# Patient Record
Sex: Male | Born: 1950 | Race: Black or African American | Hispanic: No | Marital: Married | Smoking: Former smoker
Health system: Southern US, Community
[De-identification: ages and names within clinical notes are randomized; demographics above are authoritative.]

## PROBLEM LIST (undated history)

## (undated) DIAGNOSIS — M5412 Radiculopathy, cervical region: Secondary | ICD-10-CM

## (undated) DIAGNOSIS — I2510 Atherosclerotic heart disease of native coronary artery without angina pectoris: Secondary | ICD-10-CM

## (undated) DIAGNOSIS — E785 Hyperlipidemia, unspecified: Secondary | ICD-10-CM

## (undated) DIAGNOSIS — G894 Chronic pain syndrome: Secondary | ICD-10-CM

## (undated) DIAGNOSIS — K219 Gastro-esophageal reflux disease without esophagitis: Secondary | ICD-10-CM

## (undated) DIAGNOSIS — I10 Essential (primary) hypertension: Secondary | ICD-10-CM

## (undated) HISTORY — DX: Chronic pain syndrome: G89.4

## (undated) HISTORY — DX: Essential (primary) hypertension: I10

## (undated) HISTORY — PX: CHOLECYSTECTOMY: SHX55

## (undated) HISTORY — DX: Gastro-esophageal reflux disease without esophagitis: K21.9

## (undated) HISTORY — DX: Hyperlipidemia, unspecified: E78.5

## (undated) HISTORY — DX: Atherosclerotic heart disease of native coronary artery without angina pectoris: I25.10

## (undated) HISTORY — DX: Radiculopathy, cervical region: M54.12

## (undated) HISTORY — PX: HERNIA REPAIR: SHX51

## (undated) HISTORY — PX: OTHER SURGICAL HISTORY: SHX169

## (undated) HISTORY — PX: CORONARY ARTERY BYPASS GRAFT: SHX141

---

## 2002-09-18 ENCOUNTER — Inpatient Hospital Stay (HOSPITAL_COMMUNITY): Admission: EM | Admit: 2002-09-18 | Discharge: 2002-09-20 | Payer: Self-pay | Admitting: Internal Medicine

## 2004-07-21 ENCOUNTER — Ambulatory Visit: Payer: Self-pay | Admitting: Physician Assistant

## 2006-10-28 ENCOUNTER — Inpatient Hospital Stay (HOSPITAL_COMMUNITY): Admission: RE | Admit: 2006-10-28 | Discharge: 2006-11-09 | Payer: Self-pay | Admitting: Cardiovascular Disease

## 2006-10-28 ENCOUNTER — Encounter: Payer: Self-pay | Admitting: Vascular Surgery

## 2006-10-28 ENCOUNTER — Ambulatory Visit: Payer: Self-pay | Admitting: Cardiothoracic Surgery

## 2006-10-29 ENCOUNTER — Encounter: Payer: Self-pay | Admitting: Cardiology

## 2006-10-29 ENCOUNTER — Ambulatory Visit: Payer: Self-pay | Admitting: Cardiology

## 2006-11-02 ENCOUNTER — Encounter (INDEPENDENT_AMBULATORY_CARE_PROVIDER_SITE_OTHER): Payer: Self-pay | Admitting: *Deleted

## 2006-11-17 ENCOUNTER — Ambulatory Visit: Payer: Self-pay | Admitting: Cardiovascular Disease

## 2006-11-17 LAB — CONVERTED CEMR LAB
BUN: 10 mg/dL (ref 6–23)
Basophils Relative: 0.4 % (ref 0.0–1.0)
CO2: 30 meq/L (ref 19–32)
Calcium: 9.1 mg/dL (ref 8.4–10.5)
Chloride: 107 meq/L (ref 96–112)
Eosinophils Absolute: 0.1 10*3/uL (ref 0.0–0.6)
GFR calc Af Amer: 129 mL/min
GFR calc non Af Amer: 107 mL/min
Lymphocytes Relative: 19.4 % (ref 12.0–46.0)
Monocytes Relative: 6.1 % (ref 3.0–11.0)
Neutro Abs: 5.4 10*3/uL (ref 1.4–7.7)
Platelets: 542 10*3/uL — ABNORMAL HIGH (ref 150–400)
RBC: 3.68 M/uL — ABNORMAL LOW (ref 4.22–5.81)

## 2006-11-19 ENCOUNTER — Ambulatory Visit: Payer: Self-pay | Admitting: Cardiothoracic Surgery

## 2006-12-03 ENCOUNTER — Ambulatory Visit: Payer: Self-pay | Admitting: Thoracic Surgery (Cardiothoracic Vascular Surgery)

## 2006-12-21 ENCOUNTER — Encounter: Admission: RE | Admit: 2006-12-21 | Discharge: 2006-12-21 | Payer: Self-pay | Admitting: Cardiothoracic Surgery

## 2006-12-21 ENCOUNTER — Ambulatory Visit: Payer: Self-pay | Admitting: Cardiothoracic Surgery

## 2007-01-07 ENCOUNTER — Ambulatory Visit: Payer: Self-pay | Admitting: Cardiothoracic Surgery

## 2007-01-07 ENCOUNTER — Encounter: Admission: RE | Admit: 2007-01-07 | Discharge: 2007-01-07 | Payer: Self-pay | Admitting: Cardiothoracic Surgery

## 2007-01-14 ENCOUNTER — Ambulatory Visit: Payer: Self-pay | Admitting: Cardiothoracic Surgery

## 2007-01-21 ENCOUNTER — Ambulatory Visit: Payer: Self-pay | Admitting: Cardiovascular Disease

## 2007-01-21 LAB — CONVERTED CEMR LAB
AST: 50 units/L — ABNORMAL HIGH (ref 0–37)
Bilirubin, Direct: 0.1 mg/dL (ref 0.0–0.3)
HDL: 45.4 mg/dL (ref >39.0)
Total Bilirubin: 0.5 mg/dL (ref 0.3–1.2)
Total Protein: 7.6 g/dL (ref 6.0–8.3)
Triglycerides: 95 mg/dL (ref 0–149)

## 2007-02-08 ENCOUNTER — Ambulatory Visit: Payer: Self-pay | Admitting: Cardiovascular Disease

## 2007-02-08 ENCOUNTER — Ambulatory Visit: Payer: Self-pay

## 2007-02-08 ENCOUNTER — Encounter: Payer: Self-pay | Admitting: Cardiovascular Disease

## 2007-02-08 LAB — CONVERTED CEMR LAB
BUN: 6 mg/dL (ref 6–23)
GFR calc Af Amer: 151 mL/min
GFR calc non Af Amer: 124 mL/min
Potassium: 3.6 meq/L (ref 3.5–5.1)

## 2007-02-11 ENCOUNTER — Ambulatory Visit: Payer: Self-pay | Admitting: Cardiothoracic Surgery

## 2007-02-11 ENCOUNTER — Encounter: Admission: RE | Admit: 2007-02-11 | Discharge: 2007-02-11 | Payer: Self-pay | Admitting: Thoracic Surgery

## 2007-02-17 ENCOUNTER — Ambulatory Visit (HOSPITAL_COMMUNITY): Admission: RE | Admit: 2007-02-17 | Discharge: 2007-02-17 | Payer: Self-pay | Admitting: Cardiothoracic Surgery

## 2007-02-22 ENCOUNTER — Ambulatory Visit: Payer: Self-pay | Admitting: Cardiothoracic Surgery

## 2007-02-25 ENCOUNTER — Ambulatory Visit: Payer: Self-pay | Admitting: Cardiothoracic Surgery

## 2007-03-08 ENCOUNTER — Ambulatory Visit: Payer: Self-pay | Admitting: Cardiology

## 2007-03-08 ENCOUNTER — Ambulatory Visit: Payer: Self-pay | Admitting: Internal Medicine

## 2007-03-08 LAB — CONVERTED CEMR LAB
Basophils Absolute: 0 10*3/uL (ref 0.0–0.1)
Eosinophils Relative: 1.5 % (ref 0.0–5.0)
HCT: 41.3 % (ref 39.0–52.0)
MCHC: 33.4 g/dL (ref 30.0–36.0)
Neutrophils Relative %: 44.9 % (ref 43.0–77.0)
RBC: 5.18 M/uL (ref 4.22–5.81)
RDW: 16.1 % — ABNORMAL HIGH (ref 11.5–14.6)
WBC: 5.9 10*3/uL (ref 4.5–10.5)

## 2007-03-11 ENCOUNTER — Ambulatory Visit: Payer: Self-pay | Admitting: Cardiovascular Disease

## 2007-03-23 ENCOUNTER — Ambulatory Visit: Payer: Self-pay

## 2007-04-08 ENCOUNTER — Ambulatory Visit: Payer: Self-pay | Admitting: Cardiothoracic Surgery

## 2007-05-02 ENCOUNTER — Encounter
Admission: RE | Admit: 2007-05-02 | Discharge: 2007-07-31 | Payer: Self-pay | Admitting: Physical Medicine & Rehabilitation

## 2007-05-03 ENCOUNTER — Ambulatory Visit: Payer: Self-pay | Admitting: Physical Medicine & Rehabilitation

## 2007-07-08 ENCOUNTER — Ambulatory Visit: Payer: Self-pay | Admitting: Physical Medicine & Rehabilitation

## 2007-07-13 ENCOUNTER — Ambulatory Visit: Payer: Self-pay | Admitting: Cardiovascular Disease

## 2007-08-08 ENCOUNTER — Encounter
Admission: RE | Admit: 2007-08-08 | Discharge: 2007-11-06 | Payer: Self-pay | Admitting: Physical Medicine & Rehabilitation

## 2007-08-08 ENCOUNTER — Ambulatory Visit: Payer: Self-pay | Admitting: Physical Medicine & Rehabilitation

## 2007-09-05 ENCOUNTER — Ambulatory Visit: Payer: Self-pay | Admitting: Physical Medicine & Rehabilitation

## 2007-10-20 ENCOUNTER — Ambulatory Visit: Payer: Self-pay | Admitting: Physical Medicine & Rehabilitation

## 2007-10-20 ENCOUNTER — Encounter
Admission: RE | Admit: 2007-10-20 | Discharge: 2008-01-18 | Payer: Self-pay | Admitting: Physical Medicine & Rehabilitation

## 2007-11-15 ENCOUNTER — Ambulatory Visit: Payer: Self-pay | Admitting: Cardiovascular Disease

## 2007-12-15 ENCOUNTER — Ambulatory Visit: Payer: Self-pay | Admitting: Physical Medicine & Rehabilitation

## 2008-06-07 ENCOUNTER — Encounter
Admission: RE | Admit: 2008-06-07 | Discharge: 2008-06-07 | Payer: Self-pay | Admitting: Physical Medicine & Rehabilitation

## 2008-06-18 ENCOUNTER — Ambulatory Visit: Payer: Self-pay | Admitting: Cardiovascular Disease

## 2008-06-26 ENCOUNTER — Ambulatory Visit: Payer: Self-pay | Admitting: Cardiovascular Disease

## 2008-06-26 ENCOUNTER — Inpatient Hospital Stay (HOSPITAL_COMMUNITY): Admission: AD | Admit: 2008-06-26 | Discharge: 2008-06-28 | Payer: Self-pay | Admitting: Cardiovascular Disease

## 2008-06-26 ENCOUNTER — Ambulatory Visit: Payer: Self-pay

## 2008-07-09 ENCOUNTER — Ambulatory Visit: Payer: Self-pay | Admitting: Cardiovascular Disease

## 2008-07-09 LAB — CONVERTED CEMR LAB
BUN: 15 mg/dL (ref 6–23)
Chloride: 98 meq/L (ref 96–112)
GFR calc non Af Amer: 92 mL/min
Potassium: 4.1 meq/L (ref 3.5–5.1)

## 2008-08-03 DIAGNOSIS — I10 Essential (primary) hypertension: Secondary | ICD-10-CM | POA: Insufficient documentation

## 2008-08-03 DIAGNOSIS — E782 Mixed hyperlipidemia: Secondary | ICD-10-CM

## 2008-08-03 DIAGNOSIS — I25810 Atherosclerosis of coronary artery bypass graft(s) without angina pectoris: Secondary | ICD-10-CM

## 2008-09-04 ENCOUNTER — Ambulatory Visit: Payer: Self-pay | Admitting: Cardiovascular Disease

## 2008-09-18 ENCOUNTER — Ambulatory Visit: Payer: Self-pay | Admitting: Cardiovascular Disease

## 2008-09-18 ENCOUNTER — Ambulatory Visit: Payer: Self-pay

## 2008-09-18 LAB — CONVERTED CEMR LAB
ALT: 46 units/L (ref 0–53)
HDL: 28.9 mg/dL — ABNORMAL LOW (ref >39.0)
Total Bilirubin: 1.2 mg/dL (ref 0.3–1.2)
Total Protein: 8 g/dL (ref 6.0–8.3)
VLDL: 36 mg/dL (ref 0–40)

## 2009-04-05 ENCOUNTER — Ambulatory Visit: Payer: Self-pay | Admitting: Cardiovascular Disease

## 2009-07-04 ENCOUNTER — Telehealth (INDEPENDENT_AMBULATORY_CARE_PROVIDER_SITE_OTHER): Payer: Self-pay | Admitting: *Deleted

## 2009-07-04 ENCOUNTER — Ambulatory Visit: Payer: Self-pay | Admitting: Cardiovascular Disease

## 2009-07-24 ENCOUNTER — Encounter (INDEPENDENT_AMBULATORY_CARE_PROVIDER_SITE_OTHER): Payer: Self-pay

## 2009-07-24 LAB — CONVERTED CEMR LAB
ALT: 56 units/L — ABNORMAL HIGH (ref 0–53)
AST: 66 units/L — ABNORMAL HIGH (ref 0–37)
Albumin: 3.6 g/dL (ref 3.5–5.2)
Cholesterol: 169 mg/dL (ref 0–200)
Direct LDL: 92.7 mg/dL
Total CHOL/HDL Ratio: 4
Total Protein: 7.3 g/dL (ref 6.0–8.3)
Triglycerides: 211 mg/dL — ABNORMAL HIGH (ref 0.0–149.0)

## 2009-08-15 ENCOUNTER — Telehealth: Payer: Self-pay | Admitting: Cardiovascular Disease

## 2009-10-02 ENCOUNTER — Ambulatory Visit: Payer: Self-pay | Admitting: Cardiovascular Disease

## 2009-10-02 LAB — CONVERTED CEMR LAB: Albumin: 3.4 g/dL — ABNORMAL LOW (ref 3.5–5.2)

## 2009-10-03 ENCOUNTER — Ambulatory Visit: Payer: Self-pay | Admitting: Cardiovascular Disease

## 2009-12-11 ENCOUNTER — Ambulatory Visit: Payer: Medicare Other | Admitting: Pain Medicine

## 2009-12-27 ENCOUNTER — Telehealth: Payer: Self-pay | Admitting: Cardiovascular Disease

## 2010-01-02 ENCOUNTER — Telehealth: Payer: Self-pay | Admitting: Cardiovascular Disease

## 2010-01-21 ENCOUNTER — Ambulatory Visit: Payer: Medicare Other | Admitting: Pain Medicine

## 2010-02-06 ENCOUNTER — Ambulatory Visit: Payer: Medicare Other | Admitting: Pain Medicine

## 2010-02-10 ENCOUNTER — Telehealth: Payer: Self-pay | Admitting: Cardiovascular Disease

## 2010-02-18 ENCOUNTER — Ambulatory Visit: Payer: Medicare Other | Admitting: Pain Medicine

## 2010-02-25 ENCOUNTER — Ambulatory Visit: Payer: Medicare Other | Admitting: Pain Medicine

## 2010-03-24 ENCOUNTER — Ambulatory Visit: Payer: Medicare Other | Admitting: Pain Medicine

## 2010-04-03 ENCOUNTER — Ambulatory Visit: Payer: Medicare Other | Admitting: Pain Medicine

## 2010-04-14 ENCOUNTER — Ambulatory Visit: Payer: Self-pay | Admitting: Cardiovascular Disease

## 2010-04-14 DIAGNOSIS — R079 Chest pain, unspecified: Secondary | ICD-10-CM

## 2010-04-17 ENCOUNTER — Ambulatory Visit: Payer: Medicare Other | Admitting: Pain Medicine

## 2010-04-22 ENCOUNTER — Telehealth (INDEPENDENT_AMBULATORY_CARE_PROVIDER_SITE_OTHER): Payer: Self-pay

## 2010-04-23 ENCOUNTER — Ambulatory Visit: Payer: Self-pay | Admitting: Cardiovascular Disease

## 2010-04-23 ENCOUNTER — Encounter: Payer: Self-pay | Admitting: Cardiology

## 2010-04-23 ENCOUNTER — Ambulatory Visit: Payer: Self-pay | Admitting: Cardiology

## 2010-04-23 ENCOUNTER — Encounter (HOSPITAL_COMMUNITY): Admission: RE | Admit: 2010-04-23 | Discharge: 2010-07-04 | Payer: Self-pay | Admitting: Internal Medicine

## 2010-04-23 ENCOUNTER — Ambulatory Visit: Payer: Self-pay

## 2010-04-23 DIAGNOSIS — E7800 Pure hypercholesterolemia, unspecified: Secondary | ICD-10-CM | POA: Insufficient documentation

## 2010-04-29 ENCOUNTER — Ambulatory Visit: Payer: Medicare Other | Admitting: Pain Medicine

## 2010-05-01 ENCOUNTER — Telehealth: Payer: Self-pay | Admitting: Cardiovascular Disease

## 2010-05-05 ENCOUNTER — Encounter: Payer: Self-pay | Admitting: Cardiovascular Disease

## 2010-05-05 LAB — CONVERTED CEMR LAB
AST: 75 units/L — ABNORMAL HIGH (ref 0–37)
Albumin: 3.7 g/dL (ref 3.5–5.2)
Alkaline Phosphatase: 74 units/L (ref 39–117)
Bilirubin, Direct: 0.4 mg/dL — ABNORMAL HIGH (ref 0.0–0.3)
CO2: 28 meq/L (ref 19–32)
Chloride: 100 meq/L (ref 96–112)
Cholesterol: 189 mg/dL (ref 0–200)
Creatinine, Ser: 0.8 mg/dL (ref 0.4–1.5)
Glucose, Bld: 99 mg/dL (ref 70–99)
HDL: 57.3 mg/dL (ref >39.00)
LDL Cholesterol: 114 mg/dL — ABNORMAL HIGH (ref 0–99)
Total Bilirubin: 1.2 mg/dL (ref 0.3–1.2)
Total CHOL/HDL Ratio: 3
VLDL: 17.4 mg/dL (ref 0.0–40.0)

## 2010-05-07 ENCOUNTER — Ambulatory Visit: Payer: Self-pay | Admitting: Cardiovascular Disease

## 2010-05-07 ENCOUNTER — Ambulatory Visit (HOSPITAL_COMMUNITY): Admission: RE | Admit: 2010-05-07 | Discharge: 2010-05-07 | Payer: Self-pay | Admitting: Cardiovascular Disease

## 2010-05-07 LAB — CONVERTED CEMR LAB
BUN: 21 mg/dL (ref 6–23)
Basophils Absolute: 0 10*3/uL (ref 0.0–0.1)
Creatinine, Ser: 0.9 mg/dL (ref 0.4–1.5)
GFR calc non Af Amer: 112.54 mL/min (ref >60)
Glucose, Bld: 101 mg/dL — ABNORMAL HIGH (ref 70–99)
HCT: 45.4 % (ref 39.0–52.0)
Lymphs Abs: 2.1 10*3/uL (ref 0.7–4.0)
MCV: 88.8 fL (ref 78.0–100.0)
Monocytes Absolute: 0.7 10*3/uL (ref 0.1–1.0)
Monocytes Relative: 8.1 % (ref 3.0–12.0)
Neutrophils Relative %: 67.5 % (ref 43.0–77.0)
Platelets: 188 10*3/uL (ref 150.0–400.0)
Potassium: 4.6 meq/L (ref 3.5–5.1)
Prothrombin Time: 9.5 s — ABNORMAL LOW (ref 9.7–11.8)
RDW: 15.4 % — ABNORMAL HIGH (ref 11.5–14.6)

## 2010-05-09 ENCOUNTER — Inpatient Hospital Stay (HOSPITAL_BASED_OUTPATIENT_CLINIC_OR_DEPARTMENT_OTHER)
Admission: RE | Admit: 2010-05-09 | Discharge: 2010-05-09 | Payer: Self-pay | Source: Home / Self Care | Admitting: Cardiovascular Disease

## 2010-05-09 ENCOUNTER — Ambulatory Visit: Payer: Self-pay | Admitting: Cardiovascular Disease

## 2010-06-16 ENCOUNTER — Ambulatory Visit: Payer: Medicare Other | Admitting: Pain Medicine

## 2010-07-23 ENCOUNTER — Ambulatory Visit: Payer: Self-pay | Admitting: Cardiovascular Disease

## 2010-09-02 NOTE — Progress Notes (Signed)
Summary: epidural injection  Phone Note Call from Patient Call back at [phone]   Caller: Patient Reason for Call: Talk to Nurse Summary of Call: wants to know if it ok for him to come off plavix for 7 days for epidural injection Initial call taken by: Migdalia Dk,  Dec 27, 2009 9:13 AM  Follow-up for Phone Call        I spoke with [name] about epidural injection for his lower back.  He is scheduled for 5/31 but will postpone it until he hears back from Dr. Earmon Phoenix office.  According to his MD at [facility] in [place] he needs to be off aspirin for 11 days and plavix 7 days.  If this is not okay with Dr. Excell Seltzer then Mr. [name] Keels MD will try a different treatment.  [name] will await Dr. Hilbert Corrigan return call and will resume his aspirin.  He has not stopped his plavix.   Lisabeth Devoid RN  Additional Follow-up for Phone Call Additional follow up Details #1::        Pt greater than one year out from PCI with a drug-eluting stent. OK to come off of ASA and Plavix for procedure if needed. I would like him to minimize time off of these meds to 7 days if possible. Spoke with the patient and informed him of this. Also would be happy to speak with his pain management physician if needed - he will let the pain clinic know. Additional Follow-up by: Norva Karvonen, MD,  January 01, 2010 5:10 PM

## 2010-09-02 NOTE — Progress Notes (Signed)
Summary: Need Cath  Phone Note Outgoing Call   Summary of Call: Called and left message with [name] [name] to discuss stress test results. He will call back. Dr Excell Seltzer  Follow-up for Phone Call        pt rtn call to Dr. Excell Seltzer you can reach him at [phone] [name] [name]  May 02, 2010 9:33 AM   pt returning call re test results-[phone] [name] [name]  May 05, 2010 9:51 AM  Lab and [place] results given to the pt.  The pt will proceed with cardiac cath.   Follow-up by: Julieta Gutting, RN, BSN,  May 05, 2010 1:05 PM

## 2010-09-02 NOTE — Assessment & Plan Note (Signed)
Summary: 6 month rov    Visit Type:  6 months follow up  CC:  Chest pains. Pt. states could be indigestion or gas.  History of Present Illness: [name] [name] is a 60 year old gentleman who underwent multivessel coronary bypass surgery in 2008 after presenting with non-ST-elevation MI.   Myoview stress scan was performed in 2009 and that demonstrated inferolateral wall ischemia with positive EKG changes.  He ultimately underwent catheterization that demonstrated closure of all of his vein grafts and severe stenosis at the anastomosis of the LIMA to LAD, that was treated with a drug-eluting stent.   He has been stable since that time with only occasional chest discomfort - he attributes this to indigestion as it occurs after eating. No exertional chest pain or dyspnea. No edema or other complaints.   Current Medications (verified): 1)  Plavix 75 Mg Tabs (Clopidogrel Bisulfate) .... Take One Tablet By Mouth Daily 2)  Hydrochlorothiazide 25 Mg Tabs (Hydrochlorothiazide) .... Take One Tablet By Mouth Daily. 3)  Toprol Xl 50 Mg Xr24h-Tab (Metoprolol Succinate) .... Take 1 Tablet By Mouth Once A Day 4)  Aspirin 81 Mg Tbec (Aspirin) .... Take One Tablet By Mouth Daily 5)  Nitroglycerin 0.4 Mg Subl (Nitroglycerin) .... One Tablet Under Tongue Every 5 Minutes As Needed For Chest Pain---May Repeat Times Three 6)  Celexa 20 Mg Tabs (Citalopram Hydrobromide) .... As Needed 7)  Pravastatin Sodium 40 Mg Tabs (Pravastatin Sodium) .... Take One Tablet By Mouth Daily At Bedtime 8)  Meloxicam 15 Mg Tabs (Meloxicam) .... Take 1 Tablet By Mouth Once A Day 9)  Hydrocodone-Acetaminophen 10-325 Mg Tabs (Hydrocodone-Acetaminophen) .... As Needed  Allergies (verified): No Known Drug Allergies  Past History:  Past medical history reviewed for relevance to current acute and chronic problems.  Past Medical History: Reviewed history from 08/03/2008 and no changes required. CAD s/p CABG PCI 2009 with stenting of LIMA  - LAD anastamosis Hyperlipidemia Hypertension GERD Chronic pain syndrome  Past Surgical History: CABG - multivessel Hernia repair Cholecystectomy  Review of Systems       Negative except as per HPI   Vital Signs:  Patient profile:   60 year old male Height:      66 inches Weight:      210 pounds BMI:     34.02 Pulse rate:   63 / minute Pulse rhythm:   regular Resp:     18 per minute BP sitting:   131 / 80  (left arm) Cuff size:   large  Vitals Entered By: Vikki Ports (October 03, 2009 4:18 PM) CC: Chest pains. Pt. states could be indigestion or gas Comments Pt. ran out on Plavix about 3 weeks ago   Physical Exam  General:  Pt is alert and oriented, in no acute distress. HEENT: normal Neck: normal carotid upstrokes without bruits, JVP normal Lungs: CTA CV: RRR without murmur or gallop Abd: soft, NT, positive BS, no bruit, no organomegaly Ext: no clubbing, cyanosis, or edema. peripheral pulses 2+ and equal Skin: warm and dry without rash    EKG  Procedure date:  10/03/2009  Findings:      NSR with RBBB, unchanged from previous tracing.  Impression & Recommendations:  Problem # 1:  CAD, ARTERY BYPASS GRAFT (ICD-414.04) Pt stable without angina at present. He is on dual antiplatelet Rx with ASA and plavix and should continue long-term because of high-risk coronary anatomy. Will f/u in 6 months. Encouraged him to reinitiate efforts at an exercise program with goal  toward improved fitness and weight loss.  His updated medication list for this problem includes:    Plavix 75 Mg Tabs (Clopidogrel bisulfate) .Marland Kitchen... Take one tablet by mouth daily    Toprol Xl 50 Mg Xr24h-tab (Metoprolol succinate) .Marland Kitchen... Take 1 tablet by mouth once a day    Aspirin 81 Mg Tbec (Aspirin) .Marland Kitchen... Take one tablet by mouth daily    Nitroglycerin 0.4 Mg Subl (Nitroglycerin) ..... One tablet under tongue every 5 minutes as needed for chest pain---may repeat times three  Orders: EKG w/  Interpretation (93000)  Problem # 2:  HYPERTENSION, BENIGN (ICD-401.1) Stable on current Rx.  His updated medication list for this problem includes:    Hydrochlorothiazide 25 Mg Tabs (Hydrochlorothiazide) .Marland Kitchen... Take one tablet by mouth daily.    Toprol Xl 50 Mg Xr24h-tab (Metoprolol succinate) .Marland Kitchen... Take 1 tablet by mouth once a day    Aspirin 81 Mg Tbec (Aspirin) .Marland Kitchen... Take one tablet by mouth daily  Orders: EKG w/ Interpretation (93000)  BP today: 131/80 Prior BP: 127/80 (04/05/2009)  Labs Reviewed: K+: 4.1 (07/09/2008) Creat: : 0.9 (07/09/2008)   Chol: 169 (07/04/2009)   HDL: 39.10 (07/04/2009)   LDL: 59 (09/18/2008)   TG: 211.0 (07/04/2009)  Problem # 3:  HYPERLIPIDEMIA-MIXED (ICD-272.4) Lipids at goal with LDL less than 70 mg/dL. LFT's have been mildly elevated but in a stable pattern and less than 2x ULN. Continue q 6 month monitoring. His updated medication list for this problem includes:    Pravastatin Sodium 40 Mg Tabs (Pravastatin sodium) .Marland Kitchen... Take one tablet by mouth daily at bedtime  Orders: EKG w/ Interpretation (93000)  CHOL: 169 (07/04/2009)   LDL: 59 (09/18/2008)   HDL: 39.10 (07/04/2009)   TG: 211.0 (07/04/2009)  Patient Instructions: 1)  Your physician recommends that you return for a FASTING LIPID and LIVER Profile in 6 MONTHS. (414.04, 401.1, 272.0)  2)  Your physician wants you to follow-up in:   6 MONTHS. You will receive a reminder letter in the mail two months in advance. If you don't receive a letter, please call our office to schedule the follow-up appointment. 3)  Your physician recommends that you continue on your current medications as directed. Please refer to the Current Medication list given to you today. Prescriptions: PLAVIX 75 MG TABS (CLOPIDOGREL BISULFATE) Take one tablet by mouth daily  #30 x 8   Entered by:   [name] Gutting, RN, BSN   Authorized by:   Norva Karvonen, MD   Signed by:   [name] Gutting, RN, BSN on 10/03/2009   Method used:    Electronically to        [facility] [address] (retail)       [address]       [place], [place]  [address]       Ph: [phone] or [phone]       Fax: [phone]   RxID:   [id number]
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
## 2010-09-02 NOTE — Letter (Signed)
Summary: Cardiac Catheterization Instructions- [facility]  [employer], Main Office  [address]   [place], [place] [address]   Phone: [phone]  Fax: [phone]     05/05/2010 MRN: [id number]  [facility] [address] [place], [place]  [address]  Dear Mr. Jergens,   You are scheduled for a Cardiac Catheterization on Friday May 09, 2010 with Dr. Excell Seltzer.  Please arrive to the 1st floor of the Heart and Vascular Center at [facility] at 10:30 am on the day of your procedure. Please do not arrive before 6:30 a.m. Call the Heart and Vascular Center at [phone] if you are unable to make your appointmnet. The Code to get into the parking garage under the building is 0200. Take the elevators to the 1st floor. You must have someone to drive you home. Someone must be with you for the first 24 hours after you arrive home. Please wear clothes that are easy to get on and off and wear slip-on shoes. Do not eat or drink after midnight except water with your medications that morning. Bring all your medications and current insurance cards with you.  _X__ Make sure you take your aspirin and plavix.  _X__ You may take ALL of your medications with water that morning.  The usual length of stay after your procedure is 2 to 3 hours. This can vary.  If you have any questions, please call the office at the number listed above.   Julieta Gutting, RN, BSN

## 2010-09-02 NOTE — Assessment & Plan Note (Signed)
Summary: Cardiology Nuclear Testing  Nuclear Med Background Indications for Stress Test: Evaluation for Ischemia, Graft Patency, Stent Patency  Indications Comments: Pending back procedure under general on 04/29/10 by Dr. Melvenia Beam  History: CABG, COPD, Echo, Heart Catheterization, Myocardial Infarction, Myocardial Perfusion Study, Stents  History Comments: '10 [id number]  Symptoms: Chest Pain, Chest Pressure, Chest Pressure with Exertion, Palpitations  Symptoms Comments: Last episode of CP:now, 3/10.   Nuclear Pre-Procedure Cardiac Risk Factors: Family History - CAD, History of Smoking, Hypertension, Lipids, Obesity, RBBB Caffeine/Decaff Intake: None NPO After: 11:30 PM Lungs: Clear IV 0.9% NS with Angio Cath: 20g     IV Site: (R) AC IV Started by: Stanton Kidney, EMT-P Chest Size (in) 44     Height (in): 68 Weight (lb): 206 BMI: 31.44 Tech Comments: Toprol held > 36 hours.  Nuclear Med Study 1 or 2 day study:  1 day     Stress Test Type:  Stress Reading MD:  Marca Ancona, MD     Referring MD:  [name], MD Resting Radionuclide:  Technetium 14m Tetrofosmin     Resting Radionuclide Dose:  11 mCi  Stress Radionuclide:  Technetium 35m Tetrofosmin     Stress Radionuclide Dose:  33 mCi   Stress Protocol Exercise Time (min):  10:31 min     Max HR:  146 bpm     Predicted Max HR:  162 bpm  Max Systolic BP: 146 mm Hg     Percent Max HR:  90.12 %     METS: 12.5 Rate Pressure Product:  91478    Stress Test Technologist:  Rea [employer], CMA-N     Nuclear Technologist:  Doyne Keel, CNMT  Rest Procedure  Myocardial perfusion imaging was performed at rest 45 minutes following the intravenous administration of Technetium 35m Tetrofosmin.  Stress Procedure  The patient exercised for 10:31.  The patient stopped due to fatigue and denied any chest pain.  There were significant ST-T wave changes and occasional PVC's.  He had a mild hypertensive response to exercise >189/108 at peak  exercise.  Technetium 1m Tetrofosmin was injected at peak exercise and myocardial perfusion imaging was performed after a brief delay.  QPS Raw Data Images:  Normal; no motion artifact; normal heart/lung ratio. Stress Images:  Mild basal to mid inferior perfusion defect.  Rest Images:  Mild basal to mid inferior perfusion defect, less pronounced than stress.  Subtraction (SDS):  Mild, partially reversible basal to mid inferior perfusion defect.  Transient Ischemic Dilatation:  1.08  (Normal <1.22)  Lung/Heart Ratio:  .31  (Normal <0.45)  Quantitative Gated Spect Images QGS EDV:  102 ml QGS ESV:  38 ml QGS EF:  63 % QGS cine images:  Normal wall motion.    Overall Impression  Exercise Capacity: Good exercise capacity. BP Response: Hypertensive blood pressure response. Clinical Symptoms: Short of breath, fatigue.  ECG Impression: 2 mm horizontal ST depression in V4 and V5, lasting over 6 minutes into recovery.  Overall Impression: Mild partially reversible basal to mid inferior perfusion defect, given normal wall motion may be diaphragmatic attenuation, but given ECG changes cannot rule out ischemia.  Overall Impression Comments: This study does not appear to be significantly different from the prior myoview in 2010.   Appended Document: Cardiology Nuclear Testing Inferior ischemia with significant ST depression lasting 6 minutes into recovery. Pt with high-risk anatomy (LIMA-LAD anastomotic stent) - favor cardiac catheterization for definitive evaluation. Will call patient to discuss.  Appended Document: Cardiology Nuclear Testing Pt aware  of results by phone.  Pt scheduled for cardiac cath.

## 2010-09-02 NOTE — Progress Notes (Signed)
Summary: Nuc. Pre-Procedure  Phone Note Outgoing Call Call back at [facility] Phone [phone]   Call placed by: Irean Hong, RN,  April 22, 2010 3:02 PM Summary of Call: Left message with information on Myoview Information Sheet (see scanned document for details).      Nuclear Med Background Indications for Stress Test: Evaluation for Ischemia   History: CABG, Echo, Heart Catheterization, Myocardial Infarction, Myocardial Perfusion Study, Stents  History Comments: '08 NSTEMI>CABG x5 '08 Echo:NL '09 Cath: EF=55%, 1/5 grafts patent>anastomosis LIMA>LAD. 2/10 MPS: (-) ischemia, mild inferobasal thinning, EF=61%.  Symptoms: Chest Pain    Nuclear Pre-Procedure Cardiac Risk Factors: Family History - CAD, Hypertension, Lipids, RBBB Height (in): 66

## 2010-09-02 NOTE — Progress Notes (Signed)
Summary:  lab  Phone Note Call from Patient Call back at Home Phone [phone] Call back at [phone]   Caller: Patient Reason for Call: Lab or Test Results Initial call taken by: Judie Grieve,  August 15, 2009 1:49 PM  Follow-up for Phone Call        Pt did not answer phone. The mailbox was full and it would not let me leave a message. Duncan Dull, RN, BSN  August 15, 2009 2:12 PM  talked with patient--verifed he would come for lab 09-04-09

## 2010-09-02 NOTE — Progress Notes (Signed)
Summary: need a notes to come off asa / plavix  Phone Note Call from Patient Call back at [facility] Phone [phone]   Caller: Patient Reason for Call: Talk to Nurse Summary of Call: pls fax note regarding coming off asa 11 days / plavix 7  days . office # [phone], fax [phone]. attention . Hoy Morn ,md Initial call taken by: Lorne Skeens,  January 02, 2010 12:35 PM  Follow-up for Phone Call        12/27/09 phone note faxed Follow-up by: Julieta Gutting, RN, BSN,  January 02, 2010 12:40 PM

## 2010-09-29 ENCOUNTER — Ambulatory Visit: Payer: Medicare Other | Admitting: Pain Medicine

## 2010-10-14 ENCOUNTER — Encounter: Payer: Self-pay | Admitting: Cardiovascular Disease

## 2010-10-14 ENCOUNTER — Ambulatory Visit (INDEPENDENT_AMBULATORY_CARE_PROVIDER_SITE_OTHER): Payer: Medicare Other | Admitting: Cardiovascular Disease

## 2010-10-14 DIAGNOSIS — E7800 Pure hypercholesterolemia, unspecified: Secondary | ICD-10-CM

## 2010-10-14 DIAGNOSIS — I2510 Atherosclerotic heart disease of native coronary artery without angina pectoris: Secondary | ICD-10-CM

## 2010-10-21 ENCOUNTER — Ambulatory Visit: Payer: Medicare Other | Admitting: Pain Medicine

## 2010-10-30 NOTE — Assessment & Plan Note (Signed)
Summary: FOLLOW UP - 6 MONTHS   Visit Type:  6 months follow up Primary Provider:  Dr Servando Snare [facility])  CC:  Chest discomfort.  History of Present Illness: [name] [name] is a 60 year old gentleman who underwent multivessel coronary bypass surgery in 2008 after presenting with non-ST-elevation MI.   Myoview stress scan was performed in 2009 and that demonstrated inferolateral wall ischemia with positive EKG changes.  He ultimately underwent catheterization that demonstrated closure of all of his vein grafts and severe stenosis at the anastomosis of the LIMA to LAD and he was treated with a drug-eluting stent.Followup cath to evaluate recurrent chest pain was performed in October 2011 and this demonstratred continued patency of his LIMA-LAD and patency of the stent at the LIMA anastomotic site.  He has done well since his last cath, but has had a few episodes of chest pain over the past few days. He describes a 'throbbing' pain over a focal spot in his left chest, unrelated to exertion. He denies shortness of breath or other associated symptoms. He did not use NTG. He otherwise feels well. He thinks his symptoms may be related to increased stress and anxiety over the past few days.  Current Medications (verified): 1)  Plavix 75 Mg Tabs (Clopidogrel Bisulfate) .... Take One Tablet By Mouth Daily 2)  Hydrochlorothiazide 25 Mg Tabs (Hydrochlorothiazide) .... Take One Tablet By Mouth Daily. 3)  Toprol Xl 50 Mg Xr24h-Tab (Metoprolol Succinate) .... Take 1 Tablet By Mouth Once A Day 4)  Aspirin 81 Mg Tbec (Aspirin) .... Take One Tablet By Mouth Daily 5)  Nitroglycerin 0.4 Mg Subl (Nitroglycerin) .... One Tablet Under Tongue Every 5 Minutes As Needed For Chest Pain---May Repeat Times Three 6)  Celexa 20 Mg Tabs (Citalopram Hydrobromide) .... As Needed 7)  Pravastatin Sodium 40 Mg Tabs (Pravastatin Sodium) .... Take One Tablet By Mouth Daily At Bedtime 8)  Meloxicam 15 Mg Tabs (Meloxicam) .... Take  1 Tablet By Mouth Once A Day As Needed 9)  Oxycodone-Acetaminophen 7.5-325 Mg Tabs (Oxycodone-Acetaminophen) .... As Needed 10)  Protonix 40 Mg Tbec (Pantoprazole Sodium) .... Take 1 Tablet By Mouth Once A Day  Allergies (verified): No Known Drug Allergies  Past History:  Past medical history reviewed for relevance to current acute and chronic problems.  Past Medical History: Reviewed history from 08/03/2008 and no changes required. CAD s/p CABG PCI 2009 with stenting of LIMA - LAD anastamosis Hyperlipidemia Hypertension GERD Chronic pain syndrome  Review of Systems       Positive for low back pain and depression, otherwise negative except as per HPI.  Vital Signs:  Patient profile:   60 year old male Height:      68 inches Weight:      207.75 pounds BMI:     31.70 Pulse rate:   66 / minute Pulse rhythm:   regular Resp:     18 per minute BP sitting:   110 / 80  (left arm) Cuff size:   large  Vitals Entered By: Vikki Ports (October 14, 2010 10:40 AM)  Physical Exam  General:  Pt is alert and oriented, in no acute distress. HEENT: normal Neck: normal carotid upstrokes without bruits, JVP normal Lungs: CTA CV: RRR without murmur or gallop Abd: soft, NT, positive BS, no bruit, no organomegaly Ext: no clubbing, cyanosis, or edema. peripheral pulses 2+ and equal Skin: warm and dry without rash    EKG  Procedure date:  10/14/2010  Findings:  NSR with RBBB, 66 bpm, no change from old tracings.  Impression & Recommendations:  Problem # 1:  CAD, ARTERY BYPASS GRAFT (ICD-414.04) The patient is stable. I think his recent episodes of chest pain are related to stress. He has no exertional symptoms and underwent recent cath wtih stable coronary anatomy. Will continue current medical program.  He may need to interrupt plavix in the near future for an epidural injection to control back pain. This is acceptable and should be resumed as soon as possible following the  procedure.  His updated medication list for this problem includes:    Plavix 75 Mg Tabs (Clopidogrel bisulfate) .Marland Kitchen... Take one tablet by mouth daily    Toprol Xl 50 Mg Xr24h-tab (Metoprolol succinate) .Marland Kitchen... Take 1 tablet by mouth once a day    Aspirin 81 Mg Tbec (Aspirin) .Marland Kitchen... Take one tablet by mouth daily    Nitroglycerin 0.4 Mg Subl (Nitroglycerin) ..... 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 Will continue pravastatin, work on lifestyle modification, and followup lipids in 6 months.  His updated medication list for this problem includes:    Pravastatin Sodium 40 Mg Tabs (Pravastatin sodium) .Marland Kitchen... Take one tablet by mouth daily at bedtime  CHOL: 189 (04/23/2010)   LDL: 114 (04/23/2010)   HDL: 57.30 (04/23/2010)   TG: 87.0 (04/23/2010)  Problem # 3:  HYPERTENSION, BENIGN (ICD-401.1) well-controlled.  His updated medication list for this problem includes:    Hydrochlorothiazide 25 Mg Tabs (Hydrochlorothiazide) .Marland Kitchen... Take one tablet by mouth daily.    Toprol Xl 50 Mg Xr24h-tab (Metoprolol succinate) .Marland Kitchen... Take 1 tablet by mouth once a day    Aspirin 81 Mg Tbec (Aspirin) .Marland Kitchen... Take one tablet by mouth daily  BP today: 110/80 Prior BP: 118/80 (04/14/2010)  Labs Reviewed: K+: 4.6 (05/07/2010) Creat: : 0.9 (05/07/2010)   Chol: 189 (04/23/2010)   HDL: 57.30 (04/23/2010)   LDL: 114 (04/23/2010)   TG: 87.0 (04/23/2010)  Orders: EKG w/ Interpretation (93000)  Patient Instructions: 1)  Your physician recommends that you return for a FASTING LIPID, LIVER and BMP in 6 MONTHS.  2)  Your physician recommends that you continue on your current medications as directed. Please refer to the Current Medication list given to you today. 3)  Your physician  wants you to follow-up in:  6 MONTHS.  You will receive a reminder letter in the mail two months in advance. If you don't receive a letter, please call our office to schedule the follow-up appointment. 4)  You can hold Aspirin and Plavix 7 days prior to epidural injection.  Prescriptions: PROTONIX 40 MG TBEC (PANTOPRAZOLE SODIUM) Take 1 tablet by mouth once a day  #30 x 11   Entered by:   Julieta Gutting, RN, BSN   Authorized by:   Norva Karvonen, MD   Signed by:   Julieta Gutting, RN, BSN on 10/14/2010   Method used:   Electronically to        [facility] [address]* (retail)       [address]       [place], [place]  [address]       Ph: [phone] or [phone]       Fax: [phone]   RxID:   [id number]

## 2010-10-31 ENCOUNTER — Telehealth: Payer: Self-pay | Admitting: Cardiovascular Disease

## 2010-10-31 MED ORDER — NITROGLYCERIN 0.4 MG SL SUBL: 0.4000 mg | SUBLINGUAL_TABLET | SUBLINGUAL | Status: DC | PRN

## 2010-10-31 NOTE — Telephone Encounter (Signed)
Pt wants to see if he can get a script for his nitro called into his pharmacy. Pt went to put in pocket to carry with him and cant find them.

## 2010-11-03 ENCOUNTER — Ambulatory Visit: Payer: Medicare Other | Admitting: Pain Medicine

## 2010-11-11 ENCOUNTER — Ambulatory Visit: Payer: Medicare Other | Admitting: Pain Medicine

## 2010-11-22 ENCOUNTER — Other Ambulatory Visit: Payer: Self-pay | Admitting: Cardiovascular Disease

## 2010-12-08 ENCOUNTER — Ambulatory Visit: Payer: Medicare Other | Admitting: Pain Medicine

## 2010-12-16 NOTE — Assessment & Plan Note (Signed)
[facility]                            CARDIOLOGY OFFICE NOTE   NAME:Yohn, MATTTHEW ZIOMEK                       MRN:          [id number]  DATE:06/26/2008                            DOB:          03-03-1951    REASON FOR VISIT:  Chest pain with abnormal Myoview stress test.   HISTORY OF PRESENT ILLNESS:  Mr. Czaplicki is a 60 year old gentleman well-  known to me with multivessel coronary artery disease status post  coronary bypass surgery in 2008.  He has had a lot of difficulty with  recurrent chest pain following his bypass.  He has been treated for post-  thoracotomy pain syndrome by pain management specialist.  He presented  last week with recurrent chest discomfort that involved a change in  character of his chronic chest pain syndrome.  He also complained of  severe generalized fatigue.  He has not had a strong exertional  component to his chest pain, and it has predominately occurred at rest.  He describes a numbness and tightness around the midportion of the chest  that he has had ever since surgery.  However, he describes heart pain  that is a new sensation that he has felt over the last several weeks.  This is also a substernal chest pressure that is somewhat different in  character from his chronic ongoing pain.   He underwent a Myoview stress scan earlier today with an EKG that shows  normal sinus rhythm and right bundle branch block at baseline.  With  exercise, he developed marked downsloping ST depression in his lateral  precordial leads.  These ST changes persisted well into recovery.  He  also developed chest pressure during recovery.  His stress study has not  been read preliminarily.  His images show a small to moderate area of  inferolateral ischemia.   CURRENT MEDICATIONS:  1. Gabapentin 600 mg t.i.d.  2. Lipitor 20 mg daily.  3. Aspirin 325 mg daily.  4. Metoprolol 50 mg b.i.d.  5. Hydrochlorothiazide 25 mg daily.  6. Celexa 20 mg  daily.   ALLERGIES:  NKDA.   FOCUSED PAST MEDICAL HISTORY:  Includes:  1. Coronary artery disease status post multivessel coronary bypass      surgery 2008.  2. Hypertension.  3. Depression.  4. Hyperlipidemia.  5. Post-thoracotomy syndrome.   PHYSICAL EXAMINATION:  The patient is alert and oriented.  He is in no  acute distress.  Heart rate 93 beats per minute, blood pressure was  153/97, respiratory rate 16.  HEENT:  Normal.  NECK:  Normal carotid upstrokes.  No bruits.  JVP normal.  LUNGS:  Clear bilaterally.  HEART:  Regular rate and rhythm.  Apex is discrete and nondisplaced.  ABDOMEN:  Soft, nontender, no organomegaly.  EXTREMITIES:  No clubbing, cyanosis or edema.  Peripheral pulses intact  and equal bilaterally.  SKIN:  Warm and dry without rash.   EKG shows normal sinus rhythm with right bundle branch block.   ASSESSMENT:  Mr. Walrond is a 60 year old gentleman with mild multivessel  CAD status post CABG presenting with new-onset angina  and an abnormal  Myoview scan as outlined above.  I think in the setting of his marked  EKG changes and recurrent chest pain at rest that hospital admission is  warranted.  He will be admitted from the office, and we will plan on a  diagnostic catheterization tomorrow.  I am going to preload him with 300  mg of Plavix today and start him on 75 mg of Plavix daily to be taken in  addition to his aspirin.  If he has recurrent chest pain, will start IV  heparin.  We will continue his other cardiac medications at the present  time.  Risks and indications of the procedure were reviewed in detail  with the patient and his wife, and they are agreeable to proceed.     Veverly Fells. Excell Seltzer, MD  Electronically Signed    MDC/MedQ  DD: 06/26/2008  DT: 06/26/2008  Job #: [id number]

## 2010-12-16 NOTE — Group Therapy Note (Signed)
Consult requested for the evaluation of persistent chest pain, status  post CABG April of 2008. Patient also with right lower extremity pain,  same onset.   HISTORY:  [name] is a 60 year old male who has a history of  coronary artery disease. He states he had an myocardial infarction  approximately 4 years ago but no residual heart damage. He also has the  history of emphysema per his report and used to smoke but no longer does  so. He underwent coronary artery bypassing times 5, left internal  mammary to the left anterior descending, left radial artery free graft  to circumflex marginal, sequential saphenous graft to posterior  descending and posterior lateral, saphenous vein graft to diagonal. He  had endoscopic saphenous vein harvesting from the right leg.  Postoperatively he had some volume overload   Dictation ended at this point.      Erick Colace, M.D.  Electronically Signed     AEK/MedQ  D:  05/03/2007 12:48:34  T:  05/03/2007 13:15:38  Job #:  [id number]   cc:   Kerin Perna, M.D.  [address]  [place]  [place] [address]

## 2010-12-16 NOTE — Assessment & Plan Note (Signed)
[facility]                            CARDIOLOGY OFFICE NOTE   NAME:Lizardi, HERALD VALLIN                       MRN:          [id number]  DATE:07/09/2008                            DOB:          June 25, 1951    Bonne Dolores presents for followup at the [facility] office on  July 09, 2008.  He was seen here most recently on June 26, 2008,  when he presented at the time of an abnormal Myoview stress test.  Mr.  Zane is a 60 year old gentleman, who underwent multivessel coronary  artery bypass surgery in 2008.  He has had a lot of problems with  recurrent chest pain following bypass.  His pain symptoms have been very  difficult to sort out because he has had both typical and atypical  symptoms.  His Myoview showed inferolateral ischemia and he underwent  cardiac catheterization by Dr. Clifton James on June 26, 2008.  He was  found to have occlusion of all of his vein grafts.  His left internal  mammary to LAD was patent, but there was a severe stenosis at the  anastomotic site.  He underwent stenting of the LIMA to LAD and  anastomosis with a Promus drug-eluting stent.  He tells me that he felt  better immediately after his procedure.  He continues to have some  residual near constant pain around his sternal area, but his exertional  symptoms have resolved.  He denies dyspnea, palpitations, groin pain, or  other complaints at present.  He has been contacted by cardiac rehab and  inquires today about whether he would benefit from resuming this.  He  had completed cardiac rehab after his bypass surgery in 2008.   MEDICATIONS:  1. Gabapentin 600 mg t.i.d.  2. Simvastatin 40 mg at bedtime.  3. Aspirin 325 mg daily.  4. Celexa 20 mg daily.  5. Plavix 75 mg daily.  6. Hydrochlorothiazide 25 mg daily.  7. Metoprolol 50 mg twice daily.   ALLERGIES:  NKDA.   PHYSICAL EXAMINATION:  GENERAL:  The patient is alert and oriented,  obese male in no acute  distress.  VITAL SIGNS:  Weight is 224 pounds, blood pressure 108/80, heart rate  63, and respiratory rate 16.  HEENT:  Normal.  NECK:  Normal carotid upstrokes.  No bruits.  JVP normal.  LUNGS:  Clear bilaterally.  HEART:  Regular rate and rhythm. No murmurs or gallops.  ABDOMEN:  Soft and nontender.  No organomegaly.  EXTREMITIES:  No clubbing, cyanosis, or edema.   EKG shows right bundle-branch block with lateral T-wave abnormality,  consider ischemia.   ASSESSMENT:  1. Coronary artery disease, status post coronary artery bypass graft      and recent percutaneous coronary intervention.  Mr. John has      clinically improved after stenting of his left internal mammary      artery.  I spent a long time discussing the importance of      maintaining dual antiplatelet therapy and he has been doing a good      job with taking his medications.  He  should remain on aspirin and      Plavix together for a minimum of 1 year.  If he tolerates this well      and has no bleeding problems, I would favor long-term therapy.  We      will continue with secondary risk reduction.  I encouraged him to      start cardiac rehab.  2. Hypertension.  Continue hydrochlorothiazide and metoprolol.  Blood      pressure under good control today.  3. Dyslipidemia.  His lipids have been followed at [facility] in [facility] and his most recent panel from May 2009 showed      an LDL cholesterol of 92 with triglycerides of 202, and HDL of 30      with a total cholesterol 162.  He has had difficulty with      tolerating different statins, but seems to be doing okay on      simvastatin at present.    For followup, I would like to see Mr. Kornegay back in 2 months.  I have  encouraged him to go ahead and start cardiac rehab as I think he will  gain a lot of benefit from this.     Veverly Fells. Excell Seltzer, MD  Electronically Signed    MDC/MedQ  DD: 07/09/2008  DT: 07/10/2008  Job #: [id number]   cc:    [facility]

## 2010-12-16 NOTE — Assessment & Plan Note (Signed)
OFFICE VISIT   [name], [name]  DOB:  1950/11/18                                        February 25, 2007  CHART #:  [id number]   CURRENT PROBLEMS:  1. Severe, diffuse pain, persistent 3-1/2 months after CABG x5 with      pain in his chest, left arm, and right leg.  2. 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  3. Hypertension.  4. Depression.  5. Status post CABG x5 with left IMA and left radial artery, April      2008.   PRESENT ILLNESS:  The patient is a 60 year old ex smoker who has had a  very prolonged and slow recovery after bypass surgery with his primary  complaint being persistent pain in his chest wall, left arm, left hand  in the 4th and 5th fingers, and in the right leg where the vein was  harvested using endoscopic technique.  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   CURRENT MEDICATIONS:  1. Lortab.  2. Lipitor.  3. Aspirin.  4. Lopressor 25 b.i.d.  5. Celexa.  6. Imdur 30 mg.   PHYSICAL EXAMINATION:  Blood pressure 120/80, pulse 74, respirations 18,  saturation 98%.  He is anxious.  Breath sounds are clear but diminished  at the left base.  Cardiac rhythm is regular, however, he is very  hypersensitive and tender over his entire precordium.  The sternum  remains well healed.  He has still some mild hematoma in his right thigh  at the endo vein harvest site.  Left arm incision is well healed with a  warm hand, but he has some contractures of the 4th and 5th fingers, and  pain on flexing of the 5th and 4th fingers.   LABORATORY DATA:  Pulmonary function testing was performed, interpreted  by Dr. Delton [name].  He was found to have  significant variation between his  attempts.  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   IMPRESSION:  The patient continues to make very little progress, now  almost 4 months after surgery.  His various pain issues need to be  evaluated by a pain specialist.  His shortness of breath is perplexing,  although he does have a long history of smoking with underlying chronic  obstructive pulmonary disease, and now a paresis of the left  hemidiaphragm, and I will refer him to a pulmonologist for evaluation.  His chest pain is very atypical, however, he needs to be reevaluated by  the cardiologist for possible Cardiolite scan or other evaluation.  I  plan on seeing him back in approximately a month, and have provided him  with 1 more prescription for hydrocodone until he establishes with the  pain specialist.  I also provided him with a prescription for Combivent  bronchodilator as recommended by the pulmonologist interpreting his  pulmonary function tests.  Kerin Perna, M.D.  Electronically Signed   PV/MEDQ  D:  02/25/2007  T:  02/26/2007  Job:  [id number]

## 2010-12-16 NOTE — Assessment & Plan Note (Signed)
[facility]                            CARDIOLOGY OFFICE NOTE   NAME:[name], [name] MAZZOCCO                       MRN:          [id number]  DATE:07/13/2007                            DOB:          15-May-1951    DATE OF VISIT:  July 13, 2007.   [name] [name] returned for followup at the [facility] office on  July 13, 2007.  [name] [name] is a 60 year old gentleman, who initially  presented with a non-ST elevation MI in March of this year and  ultimately underwent a multivessel coronary bypass surgery.  He has had  a great deal of trouble with postoperative pain involving his chest wall  and his saphenous vein graft harvest site.  He also complains of  numbness and tingling at the site of his left radial artery harvest.  He  has been seen by Dr. 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  His mood continues  to improve.  He unfortunately remains very sedentary, and he has gained  a good deal of weight.  He has no exertional chest pain or pressure.  He  denies orthopnea, PND, or edema.  He feels like his abdomen is swollen.  He does complain of exertional dyspnea.   [name] [name] underwent an Adenosine-Myoview scan in August that showed no  significant scar or ischemia and preserved LV function with a gaited  LVEF of 58%.  He has also undergone a postoperative echocardiogram back  in July that showed normal LV systolic function with an EF of 65% and no  significant valvular disease with the exception of moderate tricuspid  regurgitation.   CURRENT MEDICATIONS:  1. Lipitor 20 mg daily.  2. Aspirin 325 mg daily.  3. Metoprolol 50 mg twice daily.  4. He has run out of hydrochlorothiazide and has not taken that for      the last month.  5. Celexa 20 mg daily.  6. Sucralfate 160 mg four times daily.  7. Omeprazole 20 mg twice daily.  8. Trazodone 50 mg at bedtime.  9. Gabapentin 300 mg four  times daily.   ALLERGIES:  NKDA.   PHYSICAL EXAMINATION:  GENERAL:  The patient is alert and oriented.  He  is in no acute distress.  VITAL SIGNS:  Weight is 222, weight in August was 202, weight in April  was 192.  Blood pressure 146/102 in the right arm and 146/100 in the  left arm, heart rate 60, and respiratory rate 16.  HEENT:  Normal.  NECK:  Normal carotid upstrokes without bruits, jugulovenous pressure is  normal.  LUNGS:  Clear to auscultation bilaterally.  HEART:  Regular rate and rhythm without murmurs or gallops.  ABDOMEN:  Soft, obese, nontender, positive bowel sounds, no  organomegaly.  EXTREMITIES:  No clubbing, cyanosis, or edema, peripheral pulses 2+ and  equal throughout.   ASSESSMENT:  [name] [name] continues to improve from a standpoint of  postoperative pain.  He is having no symptoms of angina.  He is  tolerating his medical program well.  His  specific cardiac issues are as  follows:  1. Coronary artery disease status post coronary artery bypass      grafting.  He should remain on aspirin 325 mg daily.  I would like      to continue him on beta blocker therapy with Metoprolol.  He is on      statin therapy with Lipitor and tolerating this well.  He is having      no angina.  I am concerned about his weight gain and sedentary      lifestyle.  I strongly encouraged him to begin a walking program      with a goal of really increasing the amount of his exercise and      losing at least 20 pounds.  2. Dyslipidemia.  He is tolerating the Lipitor well.  Followup lipids      and LFTs have been requested in two weeks.  3. Hypertension with suboptimal control.  I have asked him to resume      hydrochlorothiazide 25 mg daily and to increase his dietary      potassium intake.  We will repeat a basic metabolic panel in two      weeks.  His blood pressure was much better controlled on the      combination of Metoprolol and hydrochlorothiazide.   For followup, I would like  to see [name] [name] back in four months.     Veverly Fells. Excell Seltzer, MD  Electronically Signed    MDC/MedQ  DD: 07/13/2007  DT: 07/13/2007  Job #: [id number]   cc:   Kerin Perna, M.D.  Noralyn Pick, M.D.

## 2010-12-16 NOTE — Assessment & Plan Note (Signed)
[facility]                            CARDIOLOGY OFFICE NOTE   NAME:[name] [name], [name] [name]                       MRN:          [id number]  DATE:01/21/2007                            DOB:          09-09-1950    [name] [name] was seen in followup at the [facility] Cardiology office on  January 21, 2007. He is a 59 year old gentleman from [facility] who  underwent coronary bypass surgery by Dr. Donata Clay in early April of  this year. Five bypasses were performed with the LIMA to LAD, left  radial artery to the left circumflex system, saphenous vein graft to the  first diagonal branch of the LAD and a sequential vein graft to the PDA  and posterolateral branches of the right coronary artery.   [name] [name] continues to have problems with sternal pain. He had some  numbness as well. He also complains of pain in the leg area around the  saphenous vein graft harvest site. He has had difficulty with depression  and his wife is frustrated with his mood. It appears that Dr. Donata Clay  started him on Celexa at the time of his last visit there. He complains  of fatigue today and has no other specific complaints. He tells me that  his blood pressure has been running high as well.   CURRENT MEDICATIONS:  1. Lipitor 20 mg daily.  2. Isosorbide 30 mg daily which he has not taken over the last few      days.  3. Aspirin 325 mg daily.  4. Metoprolol 25 mg 2 in the morning and 1 in the evening.  5. Celexa 10 mg daily.   ALLERGIES:  No known drug allergies.   PHYSICAL EXAMINATION:  GENERAL:  The patient is alert and oriented. He  is in no acute distress.  VITAL SIGNS:  Weight is 195 pounds, blood pressure 150/100, heart rate  67, respiratory rate 16.  HEENT:  Normal.  NECK:  Normal carotid upstrokes without bruits. Jugular venous pressure  is normal.  LUNGS:  Clear to auscultation bilaterally.  CARDIOVASCULAR:  The heart is a regular rate and rhythm without murmurs  or  gallop.  CHEST:  The midline sternotomy scar is healing well.  ABDOMEN:  Soft, nontender, no organomegaly, no abdominal bruits.  EXTREMITIES:  No clubbing, cyanosis or edema. Peripheral pulses are 2+  and equal throughout.   ASSESSMENT:  [name] [name] is currently stable from a cardiovascular  standpoint. His current problems are as follows:  1. Coronary artery disease. He is status post surgical      revascularization. He should continue on metoprolol isosorbide and      aspirin. He is also on Lipitor for his dyslipidemia. I would like      to recheck and echocardiogram just to make sure that we are not      missing anything with regard to his chest pain. He had normal LV      function at the time of his bypass surgery. I expect that this will      be unchanged.  2. Hypertension with  suboptimal control. I have asked him to increase      his metoprolol to 50 mg twice daily and he was written a      prescription for this. I would also like to start him back on      hydrochlorothiazide which he has taken in the past and responded      well to. He was written a prescription for 25 mg      hydrochlorothiazide tablets. He was counseled regarding increased      potassium intake and I would like to followup a basic metabolic      panel in a few weeks when he returns for his echocardiogram.  3. Dysphoria. He has been started on Celexa and will continue to be      monitored by his primary care physician. His wife tells me that      they are considering having him see a psychiatrist.   For followup, I would like to see [name] [name] back in 2 months.     Veverly Fells. Excell Seltzer, MD  Electronically Signed    MDC/MedQ  DD: 01/21/2007  DT: 01/21/2007  Job #: [id number]   cc:   Kerin Perna, M.D.  Noralyn Pick

## 2010-12-16 NOTE — Assessment & Plan Note (Signed)
[facility]                            CARDIOLOGY OFFICE NOTE   NAME:[name], [name]                       MRN:          [id number]  DATE:03/11/2007                            DOB:          05/07/1951    [name] [name] was seen in outpatient followup at the [facility] Cardiology  Office on March 11, 2007.  He is a 60 year old gentleman who presented  to the hospital with a non ST elevation MI and ultimately was found to  have multi-vessel coronary artery disease.  He underwent 5-vessel bypass  by Dr. Donata Clay in April of this year.  Postoperatively he has had a  lot of difficulty with chest pain, depression and multiple other  complaints.  Overall [name] [name] is much brighter at his visit today.  He  continues to have chest pain that occurs with movement.  He states that  once his pain begins he often times has persistent chest pain throughout  the course of an entire day.  He has mild exertional dyspnea but this is  improving.  His pain is sharp and stabbing and occurs in his anterior  chest area, sometimes radiating up to the neck.  He has increased his  Celexa and he and his wife both feel that his mood has been better.  He  does complain of erectile dysfunction since his discharge after surgery.  He has no other complaints today.   CURRENT MEDICINES:  1. Lipitor 20 mg daily.  2. Aspirin 325 mg daily.  3. Metoprolol 50 mg twice daily.  4. Hydrochlorothiazide 25 mg daily.  5. Combivent.  6. Celexa 20 mg daily.  7. Ambien 10 mg at bedtime.  8. He is using hydrocodone on an as-needed basis.   ALLERGIES:  NKDA.   PHYSICAL EXAMINATION:  Patient is alert and oriented, he is in no acute  distress.  Weight is 202 pounds, blood pressure is 122/83, heart rate is  77, respiratory rate is 16.  HEENT:  Normal.  NECK:  Normal carotid upstrokes without bruits, jugular venous pressure  is normal.  LUNGS:  Clear to auscultation bilaterally.  HEART:  Regular  rate and rhythm without murmurs or gallops.  CHEST:  Demonstrates a nontender chest wall.  ABDOMEN:  Soft, nontender, no organomegaly.  EXTREMITIES:  No clubbing, cyanosis or edema.  Peripheral pulses are 2+  and equal throughout.   ASSESSMENT:  [name] [name] is a 60 year old gentleman with multi-vessel  coronary artery disease and recent coronary artery bypass grafting with  the following cardiac problems:  1. Coronary artery disease.  His chest pain is a bit perplexing.  I      really have a low suspicion that it is related to myocardial      ischemia, however 4 months out of surgery is his main complaint.  I      think it is prudent to rule out significant ischemia with an      adenosine Myoview study.  He is not able to walk well enough to      give [place] a good exercise study.  Otherwise  I think we should      continue his current medical therapy without changes.  His long      acting nitrate was discontinued by Dr. Sherene Sires and I do not think he      will gain much long term medication benefit from resuming this      medication, therefore I would not restart it at this point.  His      medical regimen for his coronary disease should continue to include      aspirin, metoprolol and Lipitor for treatment of his dyslipidemia.  2. Dyslipidemia.  Lipids were last check June 20 and his cholesterol      was 127, with an HDL of 45 and an LDL of 63.  He is scheduled to      have lipids and liver function tests rechecked in December.  3. Chronic pain.  He has been followed closely by Dr. Donata Clay after      surgery and I believe he is going to have an evaluation by a pain      specialist since he has had such significant persistent      postoperative pain.  4. Erectile dysfunction.  I would like to see the results of Mr.      [name] stress test prior to prescribing any erectile dysfunction      medications.  If his stress test shows no significant ischemia then      I will provide him a  prescription of Viagra or Cialis at that      point.  He will have to be counseled about concomitant nitrate use.   For followup I would like to see [name] [name] back in 4 months or sooner if  any new problems arise.  Will follow up with him by telephone after his  stress test results are available.     Veverly Fells. Excell Seltzer, MD  Electronically Signed    MDC/MedQ  DD: 03/11/2007  DT: 03/11/2007  Job #: [id number]   cc:   Kerin Perna, M.D.  Noralyn Pick, MD

## 2010-12-16 NOTE — Procedures (Signed)
NAME:  STALEY, LUNZ NO.:  [id number]   MEDICAL RECORD NO.:  [id number]          PATIENT TYPE:  REC   LOCATION:  [facility]                          FACILITY:  [facility]   PHYSICIAN:  Erick Colace, M.D.DATE OF BIRTH:  Jan 27, 1951   DATE OF PROCEDURE:  07/08/2007  DATE OF DISCHARGE:                               OPERATIVE REPORT   PROCEDURE:  Posterior tibial nerve block under needle stim guidance.   INDICATION:  Tibial neuropathy, persistent pain right lower extremity  below knee, especially on the plantar surface of the foot, has tibial  neuropathy demonstrated with EMG testing.   Informed consent obtained after describing risks and benefits to the  patient.  These include bleeding, bruising, infection.  He elects to  proceed and has given written consent.  The patient placed prone on exam  table with Betadine prep.  Surface stim with needle with EMG stimulator  demonstrated plantar flexion twitch. area marked and reprepped with  Betadine then entered with 22 gauge 40 mm needle electrode with IV  extension tubing.  Plantar flexion twitch obtained and confirmed at 1.5  mA and followed by injection of 0.25% Marcaine x 4 mL mixed with 1 mL of  40 mg/mL Depo-Medrol.  The patient tolerated procedure well.  Injection  done after negative drawback for blood.  Post injection instructions  given.      Erick Colace, M.D.  Electronically Signed     AEK/MEDQ  D:  07/11/2007 16:47:52  T:  07/12/2007 08:37:00  Job:  [id number]

## 2010-12-16 NOTE — Assessment & Plan Note (Signed)
[name] [name] returns today with right tibial neuropathy versus distal  sciatic neuropathy causing right lower extremity numbness. This is  improving. He has mainly plantar surface foot numbness. The pain is  improving. He has been going to physical therapy. His post thoracotomy  chest pain is improving as well, but still has episodes where he has  severe pain. His constant pain is well-controlled by Tramadol two  tablets three times a day. He has right lower extremity pain as well and  he tried going off Neurontin and this made his pain worse.   His average pain is in the 5-7 range. His sleep is poor and he is  walking x10 minutes. He drives and he climbs steps. His review of  systems is positive for numbness and tingling as noted above and spasms  in the leg as well as depression. No suicidal thoughts. He has had some  weight gain and that is one of his primary concerns. He thinks it may be  his Neurontin.   His blood pressure is 129/84, pulse 65, respirations 18, O2 sat is 98%  on room air.  GENERAL: In no acute distress. Mood and affect appropriate.  BACK: He has no tenderness to palpation. His strength is 5/5 bilateral  hip flexors, knee extensors, ankle dorsiflexors.  His reflexes are 2+ bilateral knees and Achilles.  His lower extremity range of motion is normal, sensation is mildly  reduced to light touch in the plantar surface of the foot compared to  the dorsum. He had normal hip, ankle and knee range of motion and  stability. His coordination is normal.   IMPRESSION:  1. Chronic post thoracotomy syndrome.  2. Right distal sciatic neuropathy.   PLAN:  1. Will switch from Neurontin to Topamax based on his weight gain.  2. He will continue his home therapies after he finishes up with PT      tomorrow.  3. Will switch his medications and see him back in a months time. At      that point, he may be ready for full return to work.  4. Will continue Ultram 50 mg two p.o.  t.i.d.      Erick Colace, M.D.  Electronically Signed     AEK/MedQ  D:  09/27/2007 14:18:48  T:  09/27/2007 20:09:51  Job #:  [id number]   cc:   Kerin Perna, M.D.  [address]  [place]  [place] [address]

## 2010-12-16 NOTE — Assessment & Plan Note (Signed)
At worst pain 5/10, pain right now 7/10.  Mainly chest and right lower  extremity numbness and tingling to right lower extremity.  He has not  started physical therapy yet.   Went over his EMG dated May 06, 2007.  He has reduced amplitude  proximal stimulation right tibia motor, absent right sural and  superficial peroneal sensory, as well as some chronic denervation with  reinnervation right abductor hallucis and abductor digiti quinti.  This  demonstrates evidence of distal sciatic neuropathy affecting  tibial  greater than peroneal distribution and would account for symptomatology.  This was discussed with the patient in some detail as well as with his  wife.   REVIEW OF SYSTEMS:  He has chest pain with reaching backwards with his  arms or looking up or taking a deep breath.  He also has right lower  extremity pain mainly with ambulation.   PHYSICAL EXAMINATION:  Blood pressure 164/98, pulse 73, respirations 18,  O2 saturation 97% on room air.  GENERAL:  In no acute distress.  Mood and affect appropriate.  BACK:  Has no tenderness to palpation.  CHEST:  His chest has tenderness to palpation around the sternum.  EXTREMITIES:  He has good strength upper and lower extremities with  exception of right ankle plantar flexor.  He is unable to do toe-walk on  that side.  He is able to do heel-walk on the right side.  Sensation  reduced in the plantar surface of the foot greater than the dorsal  surface.   IMPRESSION:  1. Chronic post thoracotomy pain status post CABG (coronary artery      bypass graft) April of 2008.  2. Sciatica neuropathy affecting primarily the tibial component.   PLAN:  1. Will do nerve block to help facilitate physical therapy.  He will      come back next week for that.  2. I told him to make his therapy appointment to see me to get started      with this.  I wrote some specific instructions including and      expanding rib cage, deep breathing as well as  right lower extremity      desensitization.  Goal is overall to return to work in December      although this may be later in the month rather than earlier in the      month.      Erick Colace, M.D.  Electronically Signed     AEK/MedQ  D:  05/24/2007 18:01:33  T:  05/25/2007 13:29:20  Job #:  [id number]   cc:   Marcial Pacas, M.D.  [address]  [place]  [place] [address]

## 2010-12-16 NOTE — Assessment & Plan Note (Signed)
[facility]                            CARDIOLOGY OFFICE NOTE   NAME:[name] [name], [name] [name]                       MRN:          [id number]  DATE:09/04/2008                            DOB:          11/08/1950    REASON FOR VISIT:  Followup CAD.   HISTORY OF PRESENT ILLNESS:  [name] [name] is a 60 year old gentleman who  underwent multivessel coronary bypass surgery in 2008 after presenting  with non-ST-elevation MI.  He has had chronic chest pain ever since that  time.  His pain has been very difficult to sort out and much of it has  appeared to be musculoskeletal and also post sternotomy pain.  However,  Myoview stress scan was performed and that demonstrated inferolateral  wall ischemia with positive EKG changes.  He ultimately underwent  catheterization that demonstrated closure of all of his vein grafts and  severe stenosis at the anastomosis of the LIMA to LAD.  He was treated  by Dr. Clifton James with drug-eluting stent.   Following his PCI procedure, he has felt better.  However, he continues  to have chest pain in his sternal area.  This pain comes and goes and is  not clearly related to exertion.  He denies dyspnea, palpitations,  edema, or other complaints.  He is managed to loose some weight by  increasing his activity level and watching his diet.   MEDICATIONS:  Gabapentin 600 mg t.i.d., simvastatin 40 mg at bedtime.  The patient has run out both of those medications.  He continues on  Celexa 20 mg daily, Plavix 75 mg daily, HCTZ 25 mg daily, Toprol-XL 50  mg daily, and aspirin 81 mg daily.   ALLERGIES:  NKDA.   PHYSICAL EXAMINATION:  GENERAL:  He is alert and oriented in no acute  distress.  VITAL SIGNS:  Weight is 217 pounds.  This is down from 224 pounds on  July 09, 2009.  Blood pressure 112/62, heart rate 64, respiratory  rate 12.  HEENT:  Normal.  NECK:  Normal carotid upstrokes.  No bruits.  JVP normal.  LUNGS:  Clear bilaterally.  HEART:  Regular rate and rhythm.  No murmurs or gallops.  ABDOMEN:  Soft, nontender, no organomegaly.  BACK:  No CVA tenderness.  EXTREMITIES:  No clubbing, cyanosis, or edema.  Peripheral pulses intact  and equal.  SKIN:  Warm, dry without rash.   EKG shows normal sinus rhythm with right bundle branch block pattern.  This is unchanged from an EKG dated July 09, 2008.   ASSESSMENT:  1. Coronary artery disease status post coronary artery bypass graft      and percutaneous coronary intervention.  Continue current medical      program which includes dual antiplatelet therapy, beta-blockade,      and statin.  We talked at length about the importance of staying on      simvastatin for secondary risk reduction considering the extent of      his coronary artery disease.  He will start back on this medicine      today and we will repeat lipids  and LFTs in 3-4 months.  I think we      should repeat a Myoview stress scan when he comes back for      followup, which will be 6 months from the time of his stent.  He      continues to have chest pain and it is nearly impossible to      determine the etiology of this.  I suspect this is secondary to      post sternotomy pain, but again it is very hard to tell.  His stent      was placed in a high-risk area at the anastomosis of the LIMA to      LAD, and supplies a large amount of myocardium.  2. Hypertension.  The patient's blood pressure is well-controlled.      Continue current medical therapy.  3. Hyperlipidemia.  He will resume simvastatin and will repeat lipids      in 4 months.   For followup, I will see him back after his stress test and lipids were  checked.     Veverly Fells. Excell Seltzer, MD  Electronically Signed    MDC/MedQ  DD: 09/04/2008  DT: 09/05/2008  Job #: [id number]   cc:   [facility]

## 2010-12-16 NOTE — Discharge Summary (Signed)
NAMEJAYSTON, TREVINO NO.:  [id number]   MEDICAL RECORD NO.:  [id number]          PATIENT TYPE:  INP   LOCATION:  6529                         FACILITY:  [facility]   PHYSICIAN:  Verne Carrow, MDDATE OF BIRTH:  09-11-1950   DATE OF ADMISSION:  06/26/2008  DATE OF DISCHARGE:  06/27/2008                               DISCHARGE SUMMARY   DISCHARGE DIAGNOSES:  1. Chest pain with an abnormal stress Myoview.  2. Coronary artery disease, status post cardiac catheterization with      percutaneous coronary intervention this admission.  The patient      underwent placement of a PROMUS drug-eluting stent and the      anastomosis of the left internal mammary artery graft to the mid-      left anterior descending artery by Dr. Verne Carrow on      June 26, 2008.  The patient with known triple-vessel coronary      artery disease, status post five-vessel coronary artery bypass      grafting with 1/5 patent bypass graft only.  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  Therefore, he      underwent percutaneous coronary intervention as stated above.  He      had preserved left ventricular function.  3. Hypertension.  4. Depression.  5. Hyperlipidemia.  6. Chronic back pain.  7. Post thoracotomy syndrome.  8. Dyslipidemia.   HOSPITAL COURSE:  Mr. Aldava is a 60 year old African American gentleman  with known coronary artery disease, status post bypass done in 2008.  The patient had a lot of recurrent chest discomfort following his  bypass.  He was treated for post thoracotomy pain syndrome by pain  management specialist.  He saw Dr. Excell Seltzer back on June 18, 2008, for  complaints of chest discomfort different from the chronic pain,  therefore Dr. Excell Seltzer ordered a stress Myoview, stress Myoview was  abnormal.  The patient saw Dr. Excell Seltzer in the office on  June 26, 2008, stress imaging showed a small-to-moderate area of inferolateral  ischemia.  The patient admitted on that date, he was loaded with 300 mg  of Plavix and started on 75 mg daily in addition to his aspirin.  He was  taken to the cath lab on June 27, 2008, by Dr. Clifton James.  The  patient tolerated procedure without complications.  He was monitored  overnight.  Dr. Clifton James in to see the patient on June 28, 2008.  The patient was stable, complained of some back pain, but denies any  chest discomfort.   PHYSICAL EXAMINATION:  GENERAL:  Afebrile.  VITAL SIGNS:  Blood pressure stable at 104/47 and heart rate 71.   LABORATORY WORK:  Potassium 3.3, and treated BUN and creatinine 7 and  0.82.  Hematocrit 42.5 and platelets 149,000.  Dr. Clifton James reviewed  interventions with the patient and his wife.  The patient being  discharged home to follow up with Dr. Excell Seltzer.  He will need a repeat  BMET at that time.   MEDICATIONS AT TIME OF DISCHARGE:  1. Aspirin 325 mg daily.  2. Celexa 20 mg daily.  3. Plavix 75 mg daily.  4. Neurontin 600 mg t.i.d.  5. Hydrochlorothiazide 25 mg daily.  6. Metoprolol 50 mg b.i.d.  7. Zocor 40 mg daily.  8. Nitroglycerin as needed.   The patient has been given a prescription for the Plavix and  nitroglycerin.  He is also been given the post cardiac catheterization  discharge instructions.   FOLLOWUP:  Office will call the patient on Friday to schedule a postcath  visit with him in 1-2 weeks.  The patient will need to a BMET repeated  at that time.   DURATION OF DISCHARGE ENCOUNTER:  Greater than 30 minutes.      Dorian Pod, ACNP      Verne Carrow, MD  Electronically Signed    MB/MEDQ  D:  06/28/2008  T:  06/28/2008  Job:  [id number]

## 2010-12-16 NOTE — Assessment & Plan Note (Signed)
[facility]                            CARDIOLOGY OFFICE NOTE   NAME:[name], [name] INCLAN                       MRN:          [id number]  DATE:11/15/2007                            DOB:          October 28, 1950    [name] [name] was seen in followup at the [facility] Office on  November 15, 2007.  [name] [name] is a 60 year old gentleman with coronary  artery disease who underwent multivessel bypass surgery in 2008.  He is  1 year out from surgery and has had a great deal of difficulty with  postoperative pain both of his sternal incision site as well as his  saphenous vein graft harvest from his leg.  He has been under the care  of Dr. Wynn Banker for his post thoracotomy syndrome and sciatic  neuropathy.  He has had some improvement with his current regimen of  pain medications and physical therapy.  He continues to complain of  stable exertional dyspnea with moderate level activity.  His chest pain  has improved.  He has had no orthopnea, PND, edema palpitations or  lightheadedness.   MEDICATIONS:  Include gabapentin 600 mg t.i.d., Lipitor 20 mg daily,  aspirin 325 mg daily, metoprolol 50 mg b.i.d., hydrochlorothiazide 25 mg  daily, Celexa 20 mg daily, sucralfate daily, omeprazole 20 mg twice  daily, and trazodone 50 mg at bedtime.   ALLERGIES:  NKDA.   EXAM:  The patient is alert and oriented.  No acute distress.  Weight is 226, blood pressure 110/80, heart rate 78, respiratory rate  12.  HEENT:  Normal.  NECK:  Normal carotid upstrokes without bruits.  Jugulovenous pressure  is normal.  LUNGS:  Clear bilaterally.  Heart regular rate and rhythm without murmurs or gallops.  ABDOMEN:  Soft, obese, nontender no organomegaly.  EXTREMITIES:  No clubbing, cyanosis or edema.  Peripheral pulses 2+ and  equal throughout.   EKG shows sinus rhythm with right bundle branch block   ASSESSMENT:  1. Coronary artery disease status post CABG.  Continue current  therapy      with aspirin, metoprolol, and Lipitor.  [name] [name] underwent a post-      op echo that showed preserved left ventricular function with an EF      of 65%.  He also had a nuclear stress test because of persistent      pain that showed no significant scar or ischemia.  2. Dyslipidemia.  He remains on atorvastatin.  He has had mild      elevation of his LFTs from January 2 with an AST of 64 and ALT of      74, LDL at that time was 104 with an HDL of 37 and total      cholesterol of 183.  Will follow up lipids and LFTs at this time to      see where we should go from here.  If his LFTs are stable and      lipids are improved, I would continue his same regimen versus      consideration for change to Crestor if we need  more robust lipid      lowering.  3. Hypertension.  Blood pressure in the ideal range with the addition      of hydrochlorothiazide.  Continue current medications without      changes.  4. Followup.  I would like to see [name] [name] in 6 months.  I think he      is making good progress and I am pleased that his postoperative      pain is improving.     Veverly Fells. Excell Seltzer, MD  Electronically Signed    MDC/MedQ  DD: 11/15/2007  DT: 11/15/2007  Job #: [id number]   cc:   Noralyn Pick, MD

## 2010-12-16 NOTE — Assessment & Plan Note (Signed)
Patient complaining of increasing left elbow pain. He notes no new  injury. He did have a vein graft harvested for his CABG and has a large  healed incision in the middle of the volar aspect of the left forearm.   He rates his pain as a 6/10. The pain is interfering with usage of left  elbow.   PHYSICAL EXAMINATION:  GENERAL: In no acute distress. Mood and affect  appropriate.  His elbow does have some tenderness on the lateral and medial  epicondyle. Some super sensitivity over the incision itself. Hand has  normal strength as well as sensation.  SKIN: Warm and dry. No cyanosis.   IMPRESSION:  Left lateral and medial epicondylitis and tenderness right  over these areas. He had some pain with resisted wrist flexion and wrist  extension respectively. He has no evidence of carpal tunnel at this  point, although if he develops more of a tingling and burning pain  particularly nocturnal dysesthesias, would do EMG left upper extremity  to rule out.   For now, will continue current medications including tramadol 100 mg  t.i.d., Lidoderm patch and Neurontin 300 b.i.d. for now. He will go up  to t.i.d. next week.   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 Preparations are being made to get him  ready for return to work some time in December.      Erick Colace, M.D.  Electronically Signed     AEK/MedQ  D:  06/02/2007 12:51:01  T:  06/02/2007 22:25:31  Job #:  [id number]

## 2010-12-16 NOTE — Assessment & Plan Note (Signed)
[facility]                             PULMONARY OFFICE NOTE   NAME:[name] [name], [name] [name]                       MRN:          [id number]  DATE:03/08/2007                            DOB:          08-13-1950    PULMONARY CONSULTATION:   REASON FOR CONSULTATION:  Dyspnea and chest pain.   HISTORY:  This is a 60 year old black male who quit smoking in March,  2008 when he presented with what was felt to be unstable angina  associated with dyspnea.  At the time of left heart cath on March 27, he  had an end-diastolic pressure of 29 and severe three vessel disease.  His systolic function was felt to be normal.  He underwent subsequent  CABG in April but postoperatively has been bothered since the time he  woke up from surgery with bilateral anterior chest discomfort that is  worse with coughing or deep breathing and dyspnea with minimal activity.  He denies any real variability to his symptoms or associated cough or  improvement with Combivent or nitroglycerin and in terms of any of his  symptoms.  He denies any fevers, chills, sweats, or obvious weather  environmental triggers.  Also denies any orthopnea or leg swelling.   PAST MEDICAL HISTORY:  Significant for hypertension and ischemic heart  disease, as noted above.  He is status post cholecystectomy as well  remotely and has a history of nervous disorder.   ALLERGIES:  None known.   Medications include Lipitor, Isorbid, aspirin, Celexa, metoprolol,  hydrochlorothiazide, and Combivent.  He says Ultram does not help his  pain.  He has been referred to the pain clinic but not seen the pain  specialist.   SOCIAL HISTORY:  Significant for the fact that he quit smoking in March,  2008.  Has worked in Designer, fashion/clothing.   FAMILY HISTORY:  Positive for allergies but no asthma in mother or  sister.  Negative for lung cancer, rheumatologic disease, or other  pulmonary disorder.   REVIEW OF SYSTEMS:  Taken in detail  on the work sheet.  Significant for  the problems as outlined above.   PHYSICAL EXAMINATION:  This is a depressed ambulatory black male with a  moderately hopeless, helpless affect and attitude.  He is afebrile with normal vital signs.  HEENT:  Unremarkable.  Oropharynx is clear.  He had upper dentures in  place.  NECK:  Supple without cervical adenopathy or tenderness.  Trachea is  midline.  No thyromegaly.  LUNGS:  Perfectly clear bilaterally to auscultation and percussion.  HEART:  Regular rhythm without murmur, rub or gallop present.  ABDOMEN:  Soft, benign.  No palpable organomegaly, masses, or  tenderness.  EXTREMITIES:  Warm without calf tenderness, clubbing, cyanosis or edema.   Chart review indicates that he carries a diagnosis of paralyzed left  hemidiaphragm surgery.   CT scan of the chest was performed today indicating atelectatic changes  in the left base with question subtle air space disease in the right  base.  Sed rate, CBC with differential are pending.   IMPRESSION:  1. 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  I would like to have him return for a set of PFTs and      perhaps also consider fluoroscopy if not done postoperatively.      Many patients improve over six months postop, but there is no      significant therapeutic implication of making a diagnosis of      paralyzed left hemidiaphragm, so I think it is a moot issue for      now.  2. No evidence of significant asthma clinically or active bronchitis,      so I am going to ask him to stop Combivent since he is not      convinced it helps anyway.  3. Ischemic heart disease, status post coronary artery bypass graft      with no evidence of angina.  He is convinced that the  discomfort he      is having is completely different than the discomfort that led to      his hospitalization and is concerned that he is on too many      medicines.  I think it is reasonable, therefore, to have him stop      the long acting nitroglycerin to see if any pain exacerbates, and      if so, I have asked him to restart the long-acting nitrates and let      Dr. Excell Seltzer know.   My overall impression is that this patient has not bounced back from  surgery with a lot of postoperative pain and significant deconditioning,  which may be partly related to what I detect to be significant  depression and hopelessness.  I understand that his Celexa has been  increased and would consider titrating this as high as 40 mg per day if  necessary, to overcome what may be situational depression.     Charlaine Dalton. Sherene Sires, MD, [facility]  Electronically Signed    MBW/MedQ  DD: 03/08/2007  DT: 03/08/2007  Job #: [id number]   cc:   Kerin Perna, M.D.  Veverly Fells. Excell Seltzer, MD

## 2010-12-16 NOTE — Assessment & Plan Note (Signed)
ADDENDUM TO VISIT ON September 27, 2007:  I had a long discussion with  Mr. Dakin regarding his pain management.  We went over the risks and  benefits of Topamax.  I plugged in all of his medications along with  Topamax into an interactions calculator through [employer].  No  significant interactions with Topamax with any of his other medications.  He will start a gradually escalating dose 25 mg at night x one week.  Increase to b.i.d. for a week, then 1 p.o. in the morning and 2 p.o. at  night for a week, and then 2 p.o. b.i.d. thereafter.  I will see him  back in one month.   In addition, we talked about exercise program.  He showed me all of his  exercises.  We went over them one by one.  We also discussed return to  work issues.   Over 50% of the time was spent in counseling coordination of care.      Erick Colace, M.D.  Electronically Signed     AEK/MedQ  D:  09/27/2007 14:25:55  T:  09/27/2007 20:33:46  Job #:  [id number]

## 2010-12-16 NOTE — Assessment & Plan Note (Signed)
OFFICE VISIT   [name] [name], [name] [name]  DOB:  11/06/1950                                        April 08, 2007  CHART #:  [id number]   CURRENT PROBLEMS:  1. Diffuse chest wall pain and tenderness 5 months after coronary      artery bypass grafting x5 for class IV unstable angina.  2. Hypertension.  3. Depression.  4. Moderate chronic obstructive pulmonary disease, evaluated by Dr.      Sandrea Hughs, with an FEV1 and FEC of approximately 55% to 60% of      predicted.   PRESENT ILLNESS:  The patient returns for a further office visit to  review his progress.  He was last seen 6 weeks ago, and appears to be  somewhat stronger, less depressed, , and with less tenderness.  He has  an upcoming appointment with Dr. Wynn Banker at the pain clinic, and he  has been taking Ultram and hydrocodone.  His chest pain was evaluated by  Dr. Excell Seltzer who felt it was musculoskeletal, and a stress test showed no  evidence of ischemia or a scar.  His most recent 2D echo shows normal  left ventricular function.  Dr. Sherene Sires did not feel the patient would  benefit from further MDI therapy as he has stopped smoking.  He also  appears to have a better hand grip on the left with less discomfort.  The right thigh endovein tunnel is also healed and is significantly  softer and less indurated.   PHYSICAL EXAMINATION:  His vital signs are stable with a pulse of 90 and  a blood pressure of 128/80, and a saturation on room air of 95%.  Otherwise, the surgical incisions look fine, and he has a good left hand  grip with a warm hand and clear breath sounds with somewhat diminished  breath sounds at the left base.   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  I told the patient I would  provide him with another prescription of Lortab 5.0 until he is seen by  his pain specialist.  I feel that he should be considered disabled  from  his job until October 6 after which time his pain should be improved.  Certainly, his cardiac and pulmonary status would allow him to return to  his previous job duties.  I feel that I have maximized what I can offer  this patient now 5 months after surgery with healed surgical incisions  and multiple problems, probably centered around his depression.  He will  return here as needed.   Kerin Perna, M.D.  Electronically Signed   PV/MEDQ  D:  04/08/2007  T:  04/09/2007  Job:  [id number]

## 2010-12-16 NOTE — Assessment & Plan Note (Signed)
Mr. Trompeter returns today. He had a posterior tibial nerve block under  needle stim guidance July 08, 2007 and has had some partial relief  with that. He continues to have pain in the chest area post thoracotomy  as well as the bottom of his foot. Once again, he has not started  physical therapy and does not have a good explanation for this. He did,  however, request that I write another prescription and he would take it  to [facility] Physical Therapy Department.   He has had no new interval medical problems. His pain is still 7/10  level mainly right greater than left-sided parasternal area going into  the right shoulder as well as right foot, ankle and leg. He can walk 20  minutes at a time. He climbs steps. He drives.   REVIEW OF SYSTEMS:  Is positive for numbness, tingling in the foot and  trouble walking, depression and anxiety.   CURRENT MEDICATIONS:  Really not taking Ultram anymore. He gets some  partial relief with Neurontin, but really does not feel like it is  strong enough. He is taking 300 mg q.i.d. at this point.   PHYSICAL EXAMINATION:  Blood pressure 114/74, pulse 71, respiration 18.  O2 saturation 97% on room air.  He is able to toe walk, but this causes pain in his foot. He is able to  heel walk without as much pain. His knee range of motion is good. Knee  and ankle deep tendon reflexes are normal. Quad strength is normal.  Bilateral hip range of motion is normal bilaterally.  His chest has tenderness parasternal area bilaterally. Pain increases  with looking upwards, but not as bad as it has been in the past. He can  put his hands behind his head as well as behind his back. He is a bit  tight going behind his head. Upper extremity strength is normal. Upper  extremity deep tendon reflexes and range of motion is normal with the  exception of shoulder external rotation. Mildly diminished right greater  than left.   IMPRESSION:  1. Chronic post thoracotomy  pain syndrome. Overall, somewhat improved.  2. 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   PLAN:  1. Still feel that he would benefit from physical therapy. However, I      do not think we need to extend his time off of work given that he      is overall improved.  2. In terms of medication management, would increase his Neurontin to      600 t.i.d.  3. In terms of physical therapy orders, I have asked them to do      ribcage mobilization, thoracic extension as well as right foot      desensitization.   I will see him back in one month and hopefully he has already started or  even completed physical therapy by that time.      Erick Colace, M.D.  Electronically Signed     AEK/MedQ  D:  08/08/2007 16:21:42  T:  08/08/2007 19:18:38  Job #:  [id number]   cc:   Kerin Perna, M.D.  [address]  [place]  [place] [address]   Lois Huxley, MD  [facility]  [address] [place], [place] [address]

## 2010-12-16 NOTE — Group Therapy Note (Signed)
This 60 year old male who had coronary artery bypass grafting x5 in  April 2008 was referred by Dr. Donata Clay for evaluation of persistent  chest pain as well as right lower extremity pain.  The patient had no  postoperative complications in the chest region.  Did have MRSA  infection of the right hip on the lateral aspect, starting 2 weeks  postop, which was debrided and treated by his primary care physician and  healed well.  He has no persistent pain from that area.  His average  pain is 7-8/10, mainly in the chest, as well as right knee and right  medial foot, with numbness in the right medial foot.  His pain is worse  mainly daytime and evening hours.  Sleep is poor.  Pain is worse with  most activities, but also with inactivity.  His relief from medications  is good.  He can walk 5 minutes at a time.  He has no problems with his  activities of daily living.  His review of systems is positive for  numbness, tingling, trouble walking, confusion, depression, anxiety,  abdominal pain, poor appetite, shortness of breath, and wheezing.   PAST MEDICAL HISTORY:  1. COPD.  2. MI four years ago, but reportedly had no CHF.  3. High blood pressure.   PAST SURGICAL HISTORY:  1. Cardiac catheterization.  2. Cholecystectomy.  3. Hernia repair.   MEDICATIONS:  Aspirin, Lipitor, Lortab, Ambien, hydrochlorothiazide,  Celexa, omeprazole, metoprolol, trazodone.   His most recent pain medication was this morning.  He took Lortab this  morning.   SOCIAL HISTORY:  He has not worked since his surgery.  He has a release  from Dr. Donata Clay on May 09, 2007.  The patient states that his work  is rather physical, having to lift 50-pound objects, and he does not  think he can go back yet.   PHYSICAL EXAMINATION:  VITAL SIGNS:  Blood pressure 119/74, pulse 73,  respiratory rate is 18, O2 saturation 96% on room air.  GENERAL:  Well-developed, well-nourished male in no acute distress.  HEART:   Regular rate and rhythm.  LUNGS:  Clear.  CHEST:  Has mildly hypertrophic scar in midline.  Otherwise well healed.  No erythema.  He has tenderness not only right along the incision, but  along the sternum, costosternal junction, and even into the pectoralis  muscles bilaterally, and increases when he arches backward or if he  reaches back with his arms.  Also, looking up with his head causes some  pain along the incision.  EXTREMITIES:  In the lower extremity on the right, he has mildly  decreased sensation in the bottom of the foot as well as the medial  aspect of the right foot.  His deep tendon reflexes in the upper and  lower extremities are normal.  His strength is normal in the upper and  lower extremities.  He has some mild tenderness to palpation along the  endoscopic sites of the saphenous vein harvest on the right side.  His  gait is normal.  He could heel walk and toe walk, but does not like to  put weight on the right lower extremity.  Upper extremity strength and  sensation and deep tendon reflexes are normal.  BACK:  He has no tenderness to palpation.  NECK:  Full range of motion.   IMPRESSION:  1. Chronic postthoracotomy pain.  He is approximately 6 months postop.      He has some myofascial pain  in the pectoralis region, some adhered      scar in the sternal area and even along the left forearm along his      left radial artery free graft incision site.  2. Right lower extremity pain.  Rule out tibial neuropathy versus      saphenous neuropathy.  Will do EMG NCV of that area.  May need to      treat with neuropathic pain medications.   PLAN:  1. As noted above.  2. Lidoderm patch over the painful scar areas.  3. Physical therapy, which can be done at [facility] for scar      mobilization and desensitization and improved thoracic movement.  4. Will check a urine drug screen and, if negative for illicit drugs      or nondisclosed opiates, may be able to fill  some of his narcotic      analgesic medications, but overall described that between physical      therapy and more topical agent we will try to control his pain and      get him back to work in the next 1-2 months.      Erick Colace, M.D.  Electronically Signed     AEK/MedQ  D:  05/03/2007 17:26:08  T:  05/04/2007 10:18:46  Job #:  [id number]   cc:   Kerin Perna, M.D.  [address]  [place]  [place] [address]

## 2010-12-16 NOTE — Assessment & Plan Note (Signed)
Mr. Westall returns today.  He has a right tibial neuropathy verus a  distal sciatic neuropathy causing right lower extremity numbness.  This  is improving, however.  His post thoracotomy pain syndrome is improving.  He did try returning to work.  He states he was sent back home because  he could not keep up.  He just started physical therapy last week.  This  is after several months of asking him to start.  He rates his average  pain as a 5 to 7 out of 10.  It interferes with activity on a moderate  level.  His sleep is poor.  Pain is worse with walking, bending,  sitting, standing, and some other activities.  It improves with the  injection in terms of the right lower extremity.   He can walk 10 minutes at a time, he drives.  He is going to outpatient  cardiac rehab as well as physical therapy.  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  His review of systems is positive for numbness and tingling  in his right lower extremity, trouble walking, sometimes confusion,  depression, anxiety, but negative for suicidal thoughts.  His review of  systems is positive for weight gain.   SOCIAL HISTORY:  Married, lives with his wife.   MEDICATION:  Neurontin 600 t.i.d.   PHYSICAL EXAMINATION:  His chest has no tenderness to palpation other  than right at the costochondral junction on the left greater than right  side.  His right lower extremity has decreased sensation, but this is  both on the dorsal and plantar aspect of the foot.  His motor strength is 5/5 in the hip flexor, knee extensor, ankle  dorsiflexor and plantar flexor.  His gait shows no evidence of toe drag  or knee instability.  His neck range of motion is good.  He does have  some tightness when he leans backwards in his thoracic spine area.   IMPRESSION:  1. Chronic post thoracotomy syndrome.  2. Right distal sciatic neuropathy.   PLAN:  1. Continue  Neurontin.  2. Continue physical therapy x2 more weeks, then will recheck.  I      think it is reasonable for him to be off of work until he finishes      up with physical therapy because he may need a bit more endurance      before going back.  He states that part time work is not available      to him, although I think that is his idea.  He also states that me      writing work restrictions would not be helpful, it is more or less      back at 100% or nothing.  Certainly, he could drive a forklift, but      he may need to build up his endurance a little bit to get back to      lifting boxes.      Erick Colace, M.D.  Electronically Signed     AEK/MedQ  D:  09/06/2007 15:54:17  T:  09/07/2007 10:35:22  Job #:  [id number]   cc:   Kerin Perna, M.D.  [address]  [place]  [place] [address]   Lois Huxley, MD

## 2010-12-16 NOTE — Assessment & Plan Note (Signed)
[facility]                            CARDIOLOGY OFFICE NOTE   NAME:[name], [name]                       MRN:          [id number]  DATE:06/18/2008                            DOB:          04/02/1951    REASON FOR VISIT:  Chest pain with history of CAD status post CABG.   HISTORY OF PRESENT ILLNESS:  [name] [name] is a 60 year old gentleman who  underwent multivessel coronary bypass surgery in 2008 and is  approximately 1-1/2 years out from his surgery.  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  He presents today for followup and complains of  substernal chest tightness that occurs at rest predominately.  He really  has no exertional symptoms, but he also has been very inactive.  He  feels tired much of the time and his wife is concerned about his  depression.  He sleeps excessively during the day and other than doing  some walking he is really not been active or involved in an exercise  program.  He denies dyspnea, edema, orthopnea, or PND.  He has some  discomfort and feeling of numbness around his incision, but his recent  chest pain has been a different sensation.  He has no other complaints  today.   MEDICATIONS:  1. Gabapentin 600 mg t.i.d.  2. Lipitor 20 mg daily.  3. Aspirin 325 mg daily.  4. Metoprolol 50 mg b.i.d.  5. Hydrochlorothiazide 25 mg daily.  6. Celexa 20 mg daily.   P.r.n. medications include Ultram, nitroglycerin, and Cialis   ALLERGIES:  NKDA.   PHYSICAL EXAMINATION:  GENERAL:  The patient is alert and oriented.  He  has a flat affect.  He is in no acute distress.  VITAL SIGNS:  Weight is 223 pounds, blood pressure is 140/92, heart rate  80, respiratory rate 16.  HEENT:  Normal.  NECK:  Normal carotid upstrokes.  No bruits.  JVP normal.  LUNGS:  Clear bilaterally.  HEART:  Regular rate and rhythm.  No murmurs or gallops.  ABDOMEN:  Soft, nontender, no  organomegaly.  EXTREMITIES:  No clubbing, cyanosis, or edema.  Peripheral pulses intact  and equal.  SKIN:  Warm and dry without rash.   EKG shows normal sinus rhythm with right bundle branch block unchanged  from previous.   ASSESSMENT:  1. Coronary artery disease status post coronary artery bypass graft.      The patient is experiencing a new type of chest discomfort.  His      symptoms at rest are somewhat atypical, but they clearly represent      a change.  I have ordered an exercise rest stress Myoview for      further evaluation of ischemia.  He is going to continue on his      current medical regimen.  No changes to  his antianginal program      were made.  I discussed an exercise program at length.  I think it      will be good from a cardiovascular standpoint,  as well as improve      his overall sense of well being.  We had a lengthy discussion      regarding the importance of starting this program.  2. Dyslipidemia.  Lipids from May 2009, showed an HDL of 30, total      cholesterol 162, and LDL 92.  He has been off of Lipitor because of      cost.  I changed him to simvastatin 40 mg daily, so that he can      obtain his medications.  He should have followup lipids and LFTs      and 3 months.  3. Hypertension.  Continue current medical program.  Blood pressure      slightly elevated and hopefully he will institute lifestyle changes      to improve blood pressure control.   For followup, I would like to see [name] [name] back in 6 months.  We will  review his Myoview results when available.     Veverly Fells. Excell Seltzer, MD  Electronically Signed    MDC/MedQ  DD: 06/19/2008  DT: 06/19/2008  Job #: [id number]

## 2010-12-16 NOTE — Assessment & Plan Note (Signed)
Mr. Kiss returns today.  He was last seen by me June 02, 2007.  He  had a tibial nerve block at that time.  It worked for about 3 weeks.  The pain has gradually returned.  His pain at that time was about a  6/10.  He has pain of around 8 today.  He has pain both in the chest  area as well as right lower extremity.  He has not returned to work yet.  He has not gotten physical therapy yet.  He went to physical therapy,  and they mistakingly put him into cardiac rehabilitation.  He follows up  with Dr. Earlene Plater for primary care. He has not returned to work at [employer]  yet.   REVIEW OF SYSTEMS:  Positive for depression, anxiety.  Numbness,  tingling in the right foot.  His pain interferes with shopping and  household duties.  He climbs steps and he drives.  He states he tires  easily.   PHYSICAL EXAMINATION:  VITAL SIGNS:  Blood pressure 149/92, pulse 76,  respirations 18, O2 saturation 96% on room air.  GENERAL:  Mood and affect appropriate.  NEUROLOGIC:  His gait shows no toe drag or knee instability.  He can go  up on his toes and on his heels bilaterally and symmetrically.  His  sensation is reduced on the right plantar surface of the foot compared  to the dorsum.  He has good quadriceps strength, ankle dorsiflexion  strength.  CHEST:  Some tenderness in the sternal area bilaterally.  No drainage or  erythema along the incision.  He does have mild discomfort with a deep  breath in the chest area.   IMPRESSION:  1. Chest wall pain, probably postoperative pain, status post      sternotomy.  2. Right tibial neuropathy.   PLAN:  1. Repeat tibial nerve block.  2. Have rewritten orders on physical therapy that he will take to      [facility].  3. We will extend his leave from work to August 12, 2007.      Erick Colace, M.D.  Electronically Signed     AEK/MedQ  D:  07/11/2007 16:51:31  T:  07/12/2007 09:05:35  Job #:  [id number]

## 2010-12-16 NOTE — Assessment & Plan Note (Signed)
OFFICE VISIT   [name] [name], [name] [name]  DOB:  [birth year]                                        January 07, 2007  CHART #:  [id number]   PROBLEMS:  1. Status post CABG x5, a left IMA and left _radial_________  artery      on April 2 for class IV unstable angina and three-vessel disease.  2. Significant postoperative incisional pain and tenderness, with      persistent narcotic requirement.  3. Mild left hemidiaphragm elevation.  4. GERD.  5. Hypertension.   PRESENT ILLNESS:  [name] [name] returns for followup of his various  postoperative problems as listed above.  He states that his GI symptoms  of bloating have significantly improved, as he lost weight and reduced  the narcotic requirement.  He is walking and is participating in rehab.  His chest incisional pain is still quite tender and does require  medication.  However, I have the feeling that this is improved as well.  He denies shortness of breath, and he is nonsmoking.  His depression,  however, appears to be worse, as he is having episodes of crying spells,  and he has gone nonverbal several times, according to his wife.  He  denies sleeping disorder or insomnia.   PHYSICAL EXAMINATION:  VITAL SIGNS:  Blood pressure 140/90, pulse 85,  respirations 18, saturation 96%.  GENERAL:  He appears to be stronger than his last visit on May 20th.  CHEST:  His breath sounds are clear and equal.  The sternum is well  healed but remains very tender to slight touch.]  CARDIAC:  Rhythm is regular.  There is no gallop, rub or murmur.  EXTREMITIES:  The leg incision is well healed, and there is no  peripheral edema.  ABDOMEN:  Much softer and no evidence of bloating.  NEUROLOGIC:  Intact.   LABORATORY DATA:  A PA and lateral chest x-ray shows clear lung fields.  The left hemidiaphragm remains mildly elevated.  The cardiac silhouette  is stable.   IMPRESSION/PLAN:  The patient has persistent sternal hypersensitivity  and  tenderness, but this is improving.  He has persistent paresis of the  left hemidiaphragm, which has had no functional detrimental effects.  He  has significant depression, which I feel needs a trial of Celexa.  We  will start him on 10 mg a day, and after 2 to 3 weeks, this can be  increased to 20 mg a day.  This can be done with his primary care  physician, Dr. Earlene Plater, at [facility] in [place].  I have  also given him more prescriptions for Ultram and hydrocodone for his  incisional pain.  I have encouraged him to resume the cardiac rehab  program, which he stopped after having some superficial carbuncles  lanced at a Urgent Care facility at his home town.  These now are  healing.  I will see him back in 3 to 4 weeks, to continue to monitor  his improvement.   Kerin Perna, M.D.  Electronically Signed   PV/MEDQ  D:  01/07/2007  T:  01/08/2007  Job:  [id number]   cc:   Dr. Carmin Richmond [facility]

## 2010-12-16 NOTE — Assessment & Plan Note (Signed)
OFFICE VISIT   [name] [name], [name] [name]  DOB:  07-15-1951                                        February 11, 2007  CHART #:  [id number]   CURRENT PROBLEMS:  1. Status post CABG x5 with left IMA and left radial artery graft      April 2 for class 4 unstable angina and three vessel disease.  2. Prolonged and persistent postoperative incisional and      musculoskeletal pain with persistent narcotic requirement up until      now.  3. Paresis of the left hemidiaphragm.  4. Hypertension.  5. Depression, recently started on Celexa.   PRESENTING ILLNESS:  [name] [name] presents for further follow up now almost  three months after his surgery. His major problems during the recovery  have been related to his low threshold for pain, anxiety-depression,  multiple somatic complaints, and low energy and exercise tolerance. His  postoperative chest x-rays have shown a persistently elevated left  hemidiaphragm, both clear lung fields, and a stable cardiac silhouette.  He was recently evaluated by Dr. Excell Seltzer and a 2D echocardiogram showed  his LV EF to be 65%. He is not smoking, but not walking very much. He  stated that he completed six week rehab course at the [facility].  He continues on his multiple medications including Lipitor 20 mg, Imdur  30 mg, aspirin 325 mg, Lopressor 25 mg b.i.d., and Celexa 10 mg daily.   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   PHYSICAL EXAMINATION:  VITAL SIGNS:  Blood pressure 150/90, pulse 92 and  regular, respirations 18, saturation 97%.  GENERAL:  He is anxious, but alert and oriented.  CHEST:  Breath sounds are clear, slightly diminished on the left.  Cardiac rhythm is  regular and the sternum is tender, but well healed.  EXTREMITIES:  The leg incision is well healed and is not tender. There  is no significant peripheral edema. He has complaints about the left  hand being numb in the ulnar distribution and weak in the ulnar  distribution, but he can make a fist and a grip.   A chest x-ray shows a persistent elevation of the left hemidiaphragm.   IMPRESSION AND PLAN:  The patient has multiple complaints and extreme  anxiety over an increase in the activity level and returning to work. He  does have a probable paresis of the left hemidiaphragm and we will  proceed with formal pulmonary function testing to evaluate his  underlying preserve, although the office spirometry in the past has been  fairly normal. I did not give him any more narcotics today, but did give  him a refill for his Tramadol. We discussed narcotic dependency if he  had further prolonged use of the narcotics for his pain. I plan on  seeing him back in two weeks to discuss the results of his pulmonary  function tests and to review his status for returning to work. 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 He is currently on short-term disability.   Kerin Perna, M.D.  Electronically Signed   PV/MEDQ  D:  02/11/2007  T:  02/13/2007  Job:  [id number]   cc:   Veverly Fells. Excell Seltzer, MD

## 2010-12-16 NOTE — Cardiovascular Report (Signed)
[name] [name], [name] [name] NO.:  [id number]   MEDICAL RECORD NO.:  [id number]          PATIENT TYPE:  INP   LOCATION:  6529                         FACILITY:  [facility]   PHYSICIAN:  Verne Carrow, MDDATE OF BIRTH:  [birth year]   DATE OF PROCEDURE:  06/27/2008  DATE OF DISCHARGE:                            CARDIAC CATHETERIZATION   PRIMARY CARDIOLOGIST:  Veverly Fells. Excell Seltzer, MD   OPERATOR:  Verne Carrow, MD   PROCEDURES PERFORMED:  1. Left heart catheterization.  2. Selective coronary angiography.  3. Saphenous vein graft angiography.  4. Left internal mammary artery graft angiography.  5. Left ventricular angiogram.  6. Percutaneous coronary intervention with placement of a PROMUS drug-      eluting stent and the anastomosis of the left internal mammary      artery graft to the mid left anterior descending.   INDICATIONS:  Chest pain and an abnormal Myoview.   PROCEDURE IN DETAIL:  The patient was brought to the Cardiac  Catheterization Laboratory after signing informed consent for the  procedure.  His right groin was prepped and draped in a sterile fashion.  A 5-French sheath was inserted into the right femoral artery without  difficulty.  Lidocaine 1% was used for local anesthesia.  Standard  Judkins left catheter was used to selectively engage and inject the left  coronary system.  A 5-French JR-4 diagnostic catheter was used to  selectively engage and inject the right coronary artery.  I attempted to  engage the saphenous vein grafts and the radial artery graft with the JR-  4, an LCB, an AL-1, and an RCB.  I was able to engage what appeared to  be stumps of all three of these grafts with various different catheters.  Angiography was performed, however, the grafts were found to be  occluded.  I then used a JR-4 catheter to selectively engage and inject  the left internal mammary artery graft to the mid LAD.   At this point of the procedure, we  proceeded to do an intervention of  the left internal mammary artery graft stenosis at the site anastomoses  to the mid LAD.  The patient was bolused with Angiomax and started on an  Angiomax drip.  The patient had been loaded with Plavix yesterday.  I  passed a Cougar intracoronary wire down the left internal mammary artery  graft into the distal native LAD.  A 2.0 x 12 mm balloon was used for  predilatation.  A 2.5 x 12 mm PROMUS drug-eluting stent was placed at  the anastomosis of the left internal mammary artery to the mid LAD.  A  2.5 x 8 mm noncompliant balloon was used for post-dilatation and it was  inflated to 14 atmospheres.  The stenosis pre-intervention was 99% and  following intervention was 0%.  The patient tolerated the procedure well  and was taken to the holding area in stable condition.   ANGIOGRAPHIC FINDINGS:  1. The left main coronary artery has plaque disease noted in the      distal portion.  2. Left anterior descending is a large  vessel that has 50-60% stenosis      in the proximal portion just prior to a septal perforator.  The mid      LAD has a 100% occlusion.  The distal LAD fills from the left      internal mammary artery graft.  The first diagonal is a moderate-      sized vessel that has serial 50% lesions.  The second diagonal is a      smaller caliber vessel.  The third diagonal has serial 50% lesions      and is moderate-sized.  3. The circumflex artery has plaque in the proximal portion.  The      first obtuse marginal is small vessel.  The second obtuse marginal      has a 70% proximal stenosis.  The AV groove circumflex is a small-      caliber vessel that has serial 70% lesions.  4. The right coronary artery is a large dominant vessel that has 20%      proximal stenosis, 50% mid stenosis, and luminal irregularities in      the distal portion.  The posterior descending artery has a 99%      stenosis and appears to be small in caliber.  The  posterolateral      branch has a long 50% stenosis.  There is no obvious competitive      flow from vein grafts in the PDA or the posterolateral segment.  5. The saphenous vein graft to the diagonal artery is occluded.  6. The saphenous vein graft to the posterior descending artery and      posterolateral segment is occluded.  7. The radial graft to the obtuse marginal is occluded.  8. The left internal mammary artery graft to the mid LAD is patent      with a 99% stenosis at the anastomosis of the graft to the mid LAD.  9. Left ventricular angiogram shows normal left ventricular systolic      function with no wall motion abnormalities.  Ejection fraction is      estimated at 55%.  10.Hemodynamic data:  Central aortic pressure 119/79, left ventricular      pressure 128/13, and end-diastolic pressure 29.   IMPRESSION:  1. Triple-vessel coronary artery disease status post five-vessel      coronary artery bypass graft with 1/5 patent bypass grafts.  2. 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  3. Successful percutaneous coronary intervention with placement of a      PROMUS drug-eluting stent at the area of severe stenosis at the      distal left internal mammary artery graft and anastomosis to the      mid left anterior descending.  4. Preserved left ventricular function.   RECOMMENDATIONS:  This patient should be continued on aspirin and Plavix  for a year.      Verne Carrow, MD  Electronically Signed     CM/MEDQ  D:  06/27/2008  T:  06/28/2008  Job:  [id number]

## 2010-12-16 NOTE — Procedures (Signed)
NAME:  [name] [name], [name] [name] NO.:  [id number]   MEDICAL RECORD NO.:  [id number]          PATIENT TYPE:  REC   LOCATION:  TPC                          FACILITY:  [facility]   PHYSICIAN:  Erick Colace, M.D.DATE OF BIRTH:  06/21/1951   DATE OF PROCEDURE:  06/02/2007  DATE OF DISCHARGE:                               OPERATIVE REPORT   This is a right posterior tibial nerve block under needle EMG guidance.   INDICATIONS:  Tibial neuropathy with persistent pain in the right lower  extremity below the knee.   Informed consent was obtained after describing the risks and benefits of  the procedure to the patient.  These include bleeding, bruising,  infection.  He elects proceed and has given written consent.  The pain  is only been partially responsive to medication management and other  conservative care.  He has constant numbness and tingling in the foot  that he rates at around 6-7/10 and interferes with activity at an 8/10  level.   The patient placed prone on exam table.  External Stim with EMG  stimulator, repetitive action, 1 Hz repetition rate produced plantar  flexion twitch.  Area marked, prepped with Betadine, entered with a 22-  gauge 40-mm needle electrode with IV extension tubing.  Plantar flexion  twitch obtained once again and confirmed at 1 mA, followed by injection  of 1 mL of 40 mg/mL Depo-Medrol and 4 mL of 0.25% bupivacaine after  negative drawback for blood.  The patient tolerated the procedure well.  Post injection instructions given.      Erick Colace, M.D.  Electronically Signed     AEK/MEDQ  D:  06/02/2007 12:47:49  T:  06/03/2007 07:23:41  Job:  [id number]

## 2010-12-16 NOTE — Assessment & Plan Note (Signed)
HISTORY:  Mr. Kittleson follow up today.  He has a right tibial neuropathy  causing right lower extremity numbness.  He is overall improving.  His  thoracic post thoracotomy chest pain has improved as well.  He has  stopped taking his Tramadol.  No exacerbation of pain.  We did switch  him in February from Neurontin to Topamax based on weight gain.  He is  taking Topamax, now he states his appetite is poor and he is concerned  about that.  I did remind him that Topamax can do that and that is  precisely why we started him on that.  He quit it for a while and then  he restarted about a week ago after his leg pain got worse again.   His pain is in the 3-4 out of 10 range.  His sleep is poor but multiple  factors he sites.  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  The pain is worse with walking.  It improves with  therapy exercise.  He is walking 15-20 minutes at a time.  He climbs  steps.  He drives.   REVIEW OF SYSTEMS:  Positive for numbness, tingling, spasms, depression,  anxiety but when I further ask him about the depression it really sounds  like it is more situational in terms of his job.  He has occasional  abdominal pain.  No chest pain.   SOCIAL HISTORY:  He is married and lives with his wife.   PHYSICAL EXAMINATION:  VITAL SIGNS:  His blood pressure is 119/73, pulse  82, respiratory rate is 18, room air sat 98% on room air.  GENERAL:  No acute distress.  Mood and affect appropriate.  MUSCULOSKELETAL:  His gait is normal.  He is able to toe walk, heel  walk.  He has normal sensation in the lower extremities.  Normal  strength in the lower extremities.  Deep tendon reflexes 1 plus  bilateral Achilles, 2+ bilateral knees.   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   PLAN:  1. Continue his Topamax 50 b.i.d.  2. I will see him back in about 6  months.  3. He can call if he has any exacerbation of pain or if he needs a      refill on his Topamax between now and then.      Erick Colace, M.D.  Electronically Signed     AEK/MedQ  D:  12/15/2007 15:53:35  T:  12/15/2007 17:15:30  Job #:  [id number]   cc:   Kerin Perna, M.D.  [address]  [place]  [place] [address]

## 2010-12-16 NOTE — Assessment & Plan Note (Signed)
Mr. Shammas follows up today.  He has a right tibial neuropathy versus  distal sciatic neuropathy causing right lower extremity numbness.  This  is improving.  His pain is improving.  He has finished up with physical  therapy.  He also has postthoracotomy chest pain, which is slowly  resolving as well.   Because of weight gain, we switched him from Neurontin to Topamax.  He  feels that the pain relief is not quite as good as with the Neurontin,  but he is willing to stay on the Topamax for now because of hopes for  weight loss.   He continues on Tramadol two tablets three times a day.  His pain is  about a 5 out of 10 mainly in the chest and right lower extremity area.  The pain increases with activity and interferes at a moderate level.  He  is independent with all self-care and mobility.  He is able to climb  steps, he can walk 10 minutes at a time, he drives.  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  He is not sure what his options are other than there is an offer to pay  for retraining at a community college.   REVIEW OF SYSTEMS:  Positive for numbness, tingling, depression,  anxiety, weight gain, and poor appetite.   SOCIAL HISTORY:  He is married, as above.   PHYSICAL EXAMINATION:  VITAL SIGNS:  His blood pressure is 118/74, pulse  69, respirations 18, O2 SAT 97% on room air.  GENERAL:  A well-developed, well-nourished male, moderately overweight  in no acute distress, orientation x3, affect is bright and alert, gait  is normal.   His motor strength is 5/5 bilateral hip flexor, knee extension, and  ankle dorsiflexion.  Deep tendon reflexes are normal.  His chest has  mild tenderness to palpation bilateral costochondral areas.  He is able  to extend his arms over his head and behind his back now with only mild  discomfort.  Gait shows no evidence of toe drag or knee instability.  He  is able to both toe walk and heel walk.   IMPRESSION:  1. Distal sciatic neuropathy.  2. Chronic postthoracotomy syndrome.   PLAN:  1. Continue Topamax 50 p.o. b.i.d.  He has just started this      particular dosage last week.  2. Continue Ultram 50 mg two p.o. t.i.d.  3. Continue home exercise program from Physical Therapy; he seems to      be compliant.  4. I will see him back in about six weeks time to see how he is doing      overall.  At some point, look at weaning      down on the Tramadol, although he is not having side effects at the      current time.  Encouraged to continue his home exercise program.      Erick Colace, M.D.  Electronically Signed     AEK/MedQ  D:  10/21/2007 16:05:22  T:  10/21/2007 18:51:15  Job #:  [id number]   cc:   Kerin Perna, M.D.  [address]  [place]  [place] [address]   Dr. Earlene Plater (?)

## 2010-12-19 NOTE — Discharge Summary (Signed)
NAMERAVON, MORTELLARO NO.:  [id number]   MEDICAL RECORD NO.:  [id number]          PATIENT TYPE:  INP   LOCATION:  2031                         FACILITY:  [facility]   PHYSICIAN:  Kerin Perna, M.D.  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   IN-HOSPITAL DIAGNOSES:  1. Volume overload postoperatively.  2. __________  postoperatively.   SECONDARY DIAGNOSES:  1. Hypertension.  2. Gastroesophageal reflux disease.  3. Chronic back pain.  4. Status post cholecystectomy.  5. Status post hernia repair.   Dictation Ended At [employer].      Theda Belfast, [place]      Kerin Perna, M.D.  Electronically Signed    KMD/MEDQ  D:  11/05/2006  T:  11/06/2006  Job:  [id number]

## 2010-12-19 NOTE — Cardiovascular Report (Signed)
[name], MERKEL NO.:  [id number]   MEDICAL RECORD NO.:  [id number]          PATIENT TYPE:  OIB   LOCATION:  2807                         FACILITY:  [facility]   PHYSICIAN:  Veverly Fells. Excell Seltzer, MD  DATE OF BIRTH:  23-May-1951   DATE OF PROCEDURE:  10/28/2006  DATE OF DISCHARGE:                            CARDIAC CATHETERIZATION   PROCEDURES:  1. Left heart catheterization.  2. Selective coronary angiography.  3. Left ventricular angiography.   INDICATIONS:  [name] is a 60 year old gentleman who presented to  [facility] with unstable angina.  He ruled out for myocardial  infarction and became pain-free at rest.  He underwent a Myoview stress  test that demonstrated anteroapical ischemia.  He presented today for a  cardiac catheterization.   Risks and indications of the procedure were explained to the patient.  Informed consent was obtained.  The right groin was prepped, draped and  anesthetized with 1% lidocaine.  Using the modified Seldinger technique,  a 6-French sheath was placed in the right femoral artery.  Multiple  views of the left and right coronary arteries were taken using standard  Judkins catheters.  Following selective coronary angiography, the JR-4  catheter was inserted into the left subclavian and left subclavian and  LIMA angiography was performed.  A pigtail was then inserted across the  aortic valve into the left ventricle and pressures were recorded.  A  left ventriculogram was done.  A pullback across the aortic valve was  performed.  At the conclusion of the case, the patient is to be admitted  to the hospital for a cardiac surgery evaluation because he has severe  multivessel disease.  The sheath was left in place and will be pulled  with manual pressure used for hemostasis.   FINDINGS:  Aortic pressure 102/63 with a mean of 80, left ventricular  pressure 102/0 with an end-diastolic pressure of 29.   The left mainstem is  angiographically normal.  It bifurcates into the  LAD and left circumflex.   The LAD is a large-caliber vessel that courses down and wraps around the  LV apex.  The LAD has severe diffuse disease.  The proximal portion of  the vessel has a 60% stenosis just before the first septal perforator.  There is then diffuse serial 70% stenoses in the midportion of the  vessel and the distal LAD has a discrete 80% stenosis followed by a 90%  stenosis.  There are two large diagonal branches and one smaller  diagonal branch from the midportion of the LAD.  The first diagonal has  diffuse 50% disease and the second diagonal has an ostial 50-70% lesion  as well.   The left circumflex is severely diseased.  It is a large-caliber vessel  in its proximal portion.  The first OM is very small.  The second OM is  medium caliber vessel that has a 75% proximal stenosis.  The AV groove  circumflex in its distal aspect as severe 80-90% stenoses in a serial  fashion.   The right coronary artery is a large vessel that is  dominant.  There is  a 20% stenosis in the proximal portion and 40-50% stenosis in the  midportion and luminal irregularities in the distal portion.  There is a  large posterolateral branch that has a long 50% stenosis and the PDA has  an ostial 80% stenosis.   The left subclavian artery is widely patent.  The LIMA is also widely  patent.  There is no pressure gradient across the proximal portion of  the left subclavian.   Left ventriculography performed in a 30-degree RAO projection  demonstrates normal LV systolic function with an estimated LVEF of 60%.  There is no mitral regurgitation.   ASSESSMENT:  1. Severe native three-vessel coronary artery disease.  2. Normal left ventricular systolic function.  3. Widely patent left subclavian artery with a patent left internal      mammary artery.   DISCUSSION:  In the setting of [name] multivessel disease, I think  he is best suited  with a surgical evaluation for coronary bypass  surgery.  He has diffuse, severe disease of the LAD, which is likely the  cause of his angina.  Unfortunately, his disease extends into a fairly  distal portion of the vessel.  I am not sure if the vessel is graftable  beyond the area of tight stenosis.  I will review his films with our  surgical colleagues and determine the best plan of action.  We will plan  on admitting [name] to the hospital to a telemetry unit and  restarting heparin 6 hours after his sheath is out.      Veverly Fells. Excell Seltzer, MD  Electronically Signed     MDC/MEDQ  D:  10/28/2006  T:  10/28/2006  Job:  [id number]

## 2010-12-19 NOTE — Op Note (Signed)
NAMEBASIL, BUFFIN NO.:  [id number]   MEDICAL RECORD NO.:  [id number]          PATIENT TYPE:  INP   LOCATION:  2305                         FACILITY:  [facility]   PHYSICIAN:  Kerin Perna, M.D.  DATE OF BIRTH:  [birth year]   DATE OF PROCEDURE:  11/03/2006  DATE OF DISCHARGE:                               OPERATIVE REPORT   OPERATION:  1. Coronary artery bypass grafting x5 (left internal mammary artery to      left anterior descending, left radial artery free graft to      circumflex marginal, sequential saphenous vein graft to posterior      descending and posterolateral, saphenous vein graft to diagonal).  2. Endoscopic saphenous vein harvest from the right leg   PREOPERATIVE DIAGNOSIS:  Class IV unstable angina with severe two vessel  disease.   POSTOPERATIVE DIAGNOSIS:  Class IV unstable angina with severe two  vessel disease.   SURGEON:  Kerin Perna, M.D.   ASSISTANT:  Salvatore Decent. Cornelius Moras, M.D.  Coral Ceo, P.A.-C.   ANESTHESIA:  General.   INDICATIONS:  The patient is a 59 year old black male who presented with  unstable angina that she had at another hospital and was referred to  [facility] for cardiac catheterization by Dr. Excell Seltzer.  This  demonstrated severe three vessel disease with preserved LV function and  he was felt to be a candidate for surgical evaluation.  I examined the  patient in his hospital room and reviewed the results of the cardiac  cath with the patient and his family.  I discussed indications and  expected benefits of coronary bypass surgery for treatment of severe  three vessel coronary disease.  I reviewed the alternatives to surgical  therapy as well as the major aspects of the planned procedure including  the use of general anesthesia, the choice of conduit, and the use of  cardiopulmonary bypass.  I reviewed with the patient the risks to him of  coronary bypass surgery including the risks of MI, CVA, bleeding,  blood  transfusion requirement, infection, and death.  After reviewing these  issues, he demonstrated his understanding and agreed to proceed with  operation as planned.   OPERATIVE FINDINGS:  The patient has severe diffuse calcified coronary  disease.  The vein was adequate.  The radial artery and mammary artery  were excellent conduits.  The patient required 1 unit of packed cells  and 1 unit of platelets in the operating room.   PROCEDURE:  The patient was brought to the operating room and placed on  the operating table.  General anesthesia was induced.  The chest,  abdomen, legs and left arm were prepped and draped.  A sternal incision  was made as the radial artery was harvested from the left forearm.  After the artery was harvested and flushed with papaverine heparin  solution, the arm was tucked to the patient's side for the remainder of  the operation.  Next, the saphenous vein was harvested from the right  leg.  The sternal retractor was placed and the pericardium was opened  after the mammary artery had been harvested.  When the vein was  available and inspected, the patient was given heparin and pursestrings  were placed in the ascending aorta and right atrium and the patient was  cannulated and placed on bypass.  The coronaries were identified for  grafting.  There is severe disease of the posterior descending but it  was graftable.  The mammary artery and vein grafts and radial artery  were prepared for the distal anastomoses and cardioplegia catheter were  placed for both antegrade and retrograde cold blood cardioplegia.  The  patient was cooled to 32 degrees and the aortic crossclamp was applied.  800 mL of cold cardioplegia was delivered and there was good  cardioplegic arrest.   The distal coronary anastomoses were then performed.  The first distal  anastomosis was the sequential vein graft to the posterior descending  continuing to the posterolateral.  The posterior  descending was a 1.5 mm  vessel with a calcified thick lesion and a thick wall.  A side-to-side  anastomosis of the vein was sewn using running 7-0 Prolene.  The second  distal anastomosis was a continuation of the sequential vein to the  posterolateral.  The posterolateral was a smaller but less diseased  vessel measuring 1.4 mm and the end of the vein was sewn end-to-side  with running 7-0 Prolene.  Cardioplegia was redosed.  The second distal  anastomosis was to the diagonal branch of the LAD.  This was a 1.5 mm  vessel with a proximal heavily calcified lesion and a reversed saphenous  vein was sewn end-to-side with running 7-0 Prolene.  There was good flow  through graft.  The fourth distal anastomosis was to the first  circumflex marginal.  There was a smaller 1.5 mm vessel and the left  radial artery was sewn end-to-side with running 8-0 Prolene.  There was  excellent flow through the graft.  Cardioplegia was redosed.  The fifth  distal anastomosis was to the distal third of the LAD.  This had  proximal severe calcified stenosis of 90%.  The left IMA pedicle was  brought through an opening created in the left lateral pericardium and  was brought down onto the LAD and sewn end-to-side with running 8-0  Prolene.  There was excellent flow through the anastomosis after briefly  releasing the pedicle bulldog on the mammary artery.  The bulldog was  reapplied and the pedicle was secured to the epicardium.  Cardioplegia  was redosed.   While the crossclamp was still in place, three proximal anastomoses were  performed.  First, the radial artery free graft was sewn end-to-side to  the superior aspect of the ascending aorta using running 7-0 Prolene.  Next, the vein grafts were sewn using running 6-0 Prolene and prior to  tying down the final proximal anastomosis, air was vented from the coronaries with a dose of retrograde warm blood cardioplegia.  The  crossclamp was then removed and the  heart resumed a spontaneous rhythm.  Bypass grafts were checked and all were hemostatic at the proximal and  distal anastomoses and had good flow.  Temporary pacing wires were  applied.  The lungs were re-expanded.  The patient was then weaned from  bypass without difficulty.  Protamine was administered to reverse the  heparin.  There was no adverse reaction.  The mediastinum was irrigated  with warm antibiotic irrigation.  The leg incision was irrigated and  closed in a standard fashion.  The pericardium  was closed superiorly.  Two mediastinal and a left pleural chest tubes were placed and brought  out through separate incisions.  The sternum was closed with interrupted  steel wire.  The pectoralis fascia was closed with a running #1 Vicryl.  The leg incision was closed in a standard fashion.  The subcutaneous and  skin layers of the chest incision were closed with running Vicryl and  sterile dressings were applied.  Total bypass time was 145 minutes with  crossclamp time of 98 minutes.      Kerin Perna, M.D.  Electronically Signed     PV/MEDQ  D:  11/03/2006  T:  11/03/2006  Job:  [id number]   cc:   Veverly Fells. Excell Seltzer, MD

## 2010-12-19 NOTE — Discharge Summary (Signed)
[name] [name], [name] [name] NO.:  [id number]   MEDICAL RECORD NO.:  [id number]          PATIENT TYPE:  INP   LOCATION:  2031                         FACILITY:  [facility]   PHYSICIAN:  Kerin Perna, M.D.  DATE OF BIRTH:  [birth year]   DATE OF ADMISSION:  10/28/2006  DATE OF DISCHARGE:                               DISCHARGE SUMMARY   ADMISSION DIAGNOSIS:  Unstable angina.   DISCHARGE DIAGNOSES:  1. Two vessel coronary artery disease status post coronary artery      bypass graft.  2. History of unstable angina.  3. Hypertension.  4. Gastroesophageal reflux disease.  5. Chronic back pain.  6. Status post cholecystectomy and hernia repair.  7. Postoperative acute blood loss anemia.  8. Postoperative volume over load.  9. Positive ileus, now resolved.   PROCEDURE:  On October 28, 2006, the patient underwent cardiac  catheterization by Dr. Excell Seltzer.  On November 22, 2006, the patient underwent  coronary artery bypass graft times 5 (left internal mammary artery to  left anterior descending, left radial artery pregraft to circumflex  marginal, sequential saphenous vein grafts to posterior descending and  posterior lateral saphenous vein grafts to diagonal).  Endoscopic vein  harvest to right leg by Dr. Kathlee Nations Trigt.   CONSULTATIONS:  Dr. Kathlee Nations Trigt was consulted on October 28, 2006 for  cardiothoracic surgery.   HISTORY AND PHYSICAL:  This is a 60 year old black male who presented  with unstable angina that he had another hospital and was referred to  [facility].  The patient then underwent cardiac catheterization by  Dr. Excell Seltzer.  Please see the history and physical for further details.   HOSPITAL COURSE:  The patient underwent cardiac cath on October 28, 2006  by Dr. Excell Seltzer which demonstrated severe two vessel coronary artery  disease with preserved LV function and is felt to be a candidate for  surgical revascularization.  The patient was scheduled for November 02, 2006.  Prior to surgery, the patient had vascular Dopplers which showed  no internal carotid artery stenosis bilaterally.  The vertebral flow was  antegrade.  The patient had normal ABIs in the upper and lower  extremities.   Prior to surgery, the patient was on IV heparin and statins.  Blood  pressure was well controlled with a blood pressure of 116/79.  He  remained afebrile.  Smoking cessation did discuss with the patient.  He  had no further episodes of chest pain or shortness of breath.   On November 02, 2006 the patient underwent CABG times 5 by Dr. Donata Clay  without any complications.  Postoperatively, the patient progressed as  expected.  The patient was extubated on postoperative day one.  He was  maintained on 4 liters of oxygen with 92% sats.  His oxygen will be  weaned as tolerated.  He is receiving incentive spirometry  appropriately.   The patient did have postoperative acute blood loss anemia and did not  require any blood transfusions.  Hemoglobin and hematocrit on  postoperative day two was 8.3 and 23.8.  Iron was started  and this will  be watched closely.  The patient did have a mild ileus postoperatively.  His diet was increased as tolerated.  He is currently eating a regular  diet without any problems.  The patient had postoperative volume  overload.  He is being diuresed with Lasix and responding appropriately.  He did have some hyperkalemia postoperatively, however his extra  potassium is being held and this is being watched closely.  Potassium on  postoperative day 3 is 5.0.  The patient has some hypernatremia  postoperatively, however, this has remained stable.  The patient was  started on Imdur on postoperative day 1 for his radial artery graft.  Chest x-ray remained stable.  He has been maintaining normal sinus  rhythm with blood pressure well controlled on a low dose beta blocker at  115/71 on postoperative day three.   Postoperatively, the patient did have  some hyperglycemia and he was on a  low dose Lantus.  He has no prior history of diabetes mellitus.  His  Lantus will be weaned prior to discharge.  CBGs are stable at  94/100/104.  The patient is ambulating with cardiac rehab daily with a  fairly steady gait.  Renal function is stable with a BUN of 21,  creatinine of 1.0.   His incisions remain clean and dry without drainage.   DISPOSITION:  The patient will be discharged home in the next 1-2 days  provided he remains in normal sinus rhythm and hemodynamically stable.   MEDICATIONS:  Aspirin 325 mg p.o. daily.  Toprol XL 25 mg p.o. daily.  Lipitor 20 mg p.o. every evening.  Oxy-Codone 5/10 mg every 4 hours  p.r.n.  Niferex 72 m p.o. b.i.d.  Robitussin 600 mg p.o. b.i.d.  Lasix  20 mg p.o. daily.  Potassium 4 mEq p.o. daily.  Protonix 20 mg p.o.  daily.   DISCHARGE INSTRUCTIONS:  The patient is to follow a low fat/low salt  diet.  No driving, heavy lifting more than 10 pounds.  The patient is to  ambulate 3-4 times daily and increase as tolerated.  He may shower,  clean incisions with mild soap and water.  He is to call should any  wound problems arise such as erythema, drainage or temperature greater  than 101.5.  The patient is to follow up with Dr. Donata Clay in two  weeks.  The office will contact him with time and day of appointment.  He is to call Dr. Excell Seltzer for an appointment in two weeks.   DICTATED BY:  Constance Holster, PA      Kerin Perna, M.D.  Electronically Signed     PV/MEDQ  D:  11/05/2006  T:  11/06/2006  Job:  [id number]

## 2010-12-19 NOTE — Assessment & Plan Note (Signed)
[facility]                            CARDIOLOGY OFFICE NOTE   NAME:[name], [name]                         MRN:          [id number]  DATE:11/17/2006                            DOB:          [birth year]    [name] [name] was seen in hospital followup at the [facility]  [facility] on November 17, 2006.  He is a 60 year old man from [facility], who  had noticed some increasing chest pain and had an abnormal Myoview  stress test.  He was referred for outpatient cardiac catheterization.  His catheterization demonstrated severe three-vessel coronary artery  disease with preserved left ventricular systolic function.  He was  referred for coronary bypass surgery, which was performed by Dr. Donata Clay on April 2.  He underwent five-vessel bypass with a LIMA to LAD,  left radial artery to the circ OM system, sequential vein graft to the  PDA and posterolateral branches of the right coronary artery and a  saphenous vein graft to the diagonal branch.  [name] [name] had post-op  anemia, as well as a postoperative ileus.  Otherwise, he did well  postoperatively and did not have any major problems.  He was ultimately  discharged home on April 8th and presents today for followup.   Since his discharge home, he has continued to have a good deal of pain  in his sternal area that is constant in nature.  He has been taking  hydrocodone every 4 hours, and he tells me that this works fairly well  for the first 2 to 2-1/2 hours, and then he has a significant amount of  pain until his next dose.  He also has some pain in his leg area, the  site of the saphenous vein graft harvest.  Other than that, he is doing  well.  He has been walking outside at least a couple of times daily and  is doing well with walking.  He has not had any dyspnea, orthopnea or  PND.  He denies any edema, lightheadedness or syncope.  He has no other  complaints at this time.   CURRENT MEDICATIONS:  1.  Metroprolol 25 mg twice daily.  2. Lipitor 20 mg daily.  3. Isosorbide 30 mg one half daily.  4. Aspirin 325 mg daily.  5. Ferrex 150 mg twice daily.   ALLERGIES:  NKDA.   PHYSICAL EXAMINATION:  GENERAL:  On examination, he is alert and  oriented, in no acute distress.  VITAL SIGNS:  Weight is 192 pounds, blood pressure is 100/70.  Heart  rate is 93, respiratory rate 16.  HEENT:  Normal.  NECK:  Normal carotid upstrokes without bruits.  Jugular venous pressure  is normal.  LUNGS:  Clear to auscultation bilaterally.  HEART:  Regular rate and rhythm without murmurs or gallops.  CHEST:  Shows a well-healing midline sternotomy scar.  ABDOMEN:  Soft and nontender, positive bowel sounds.  EXTREMITIES:  There is no clubbing, cyanosis or edema.  The right radial  artery harvest site has staples in place and appears to be healing well,  without any  signs of erythema or infection.   EKG demonstrates normal sinus rhythm with a right bundle branch block  and an inferior T-wave abnormality.   ASSESSMENT:  [name] [name] is currently stable from a cardiac standpoint.  He is slowly recovering from his bypass surgery, and his main problem at  present is postoperative pain, not related to his sternotomy.  He is  about out of medication for pain and is going to see Dr. Donata Clay in 2  days.  I gave him a refill of his hydrocodone, acetaminophen 10/500 mg  to be taken 1-2 every 4 to 6 hours, #24 were dispensed with no refills.  This will carry him through, at least until his appointment with Dr. Donata Clay, and he can determine whether he will need anymore analgesic  medication.   From a cardiovascular standpoint, he is tolerating low-dose metoprolol  and Lipitor, as well as aspirin and isosorbide.  He has had preserved  left ventricular function preoperatively, and we would expect him to  continue to do well from a cardiac standpoint.  I did not make any  changes to his medical regimen today, as his  blood pressure is  borderline.  I plan on seeing him back in 8 weeks and will consider  upward titration of his beta blocker at that time.  Will also recheck  his lipids at the time of his return visit, to see if he will require a  higher dose of Lipitor.  As always, [name] [name] was advised to contact us,  if he has any problems in the interim.  Otherwise, I plan on seeing him  back in 8 weeks.     Veverly Fells. Excell Seltzer, MD  Electronically Signed    MDC/MedQ  DD: 11/17/2006  DT: 11/17/2006  Job #: [id number]   cc:   Noralyn Pick, MD  Kerin Perna, M.D.

## 2010-12-19 NOTE — Discharge Summary (Signed)
NAMENAKSH, RADI NO.:  [id number]   MEDICAL RECORD NO.:  [id number]          PATIENT TYPE:  INP   LOCATION:  2031                         FACILITY:  [facility]   PHYSICIAN:  Kerin Perna, M.D.  DATE OF BIRTH:  Sep 23, 1950   DATE OF ADMISSION:  10/28/2006  DATE OF DISCHARGE:  11/09/2006                               DISCHARGE SUMMARY   ADDENDUM TO DISCHARGE SUMMARY:   HISTORY OF PRESENT ILLNESS:  Mr. Schueler continued to progress well, and  it was anticipated that he will be ready for discharge home on  11/09/2006.  In the interim, since the previously dictated discharge  summary, he did have a drop in his hemoglobin and hematocrit to 7.8 and  22.4, respectively.  He was symptomatic with weakness and tachycardia  and was subsequently transfused a unit of packed red blood cells.  It  was also continued on supplemental oral iron therapy.  Also, he has  remained somewhat tachycardiac despite his transfusion and his beta-  blocker dose is increased to Lopressor 25 mg b.i.d..  He has continued  to run intermittent fevers, and this was felt to be secondary to  pulmonary status.  He has been encouraged to use his incentive  spirometer and continue pulmonary toilet measures and his oxygen is  currently being weaned to be discontinued.  His white blood cell count  has remained stable at 8000.  His remaining labs are unchanged.  His  incisions are all healing well.  He is tolerating a regular diet at  present and his previous ileus has resolved.  He will have repeat CBC on  11/09/2006 to follow up his hemoglobin and hematocrit following his  transfusion.  It is anticipated that if he has continued to improve and  has had no further fevers that he will hopefully be ready for discharge  home at that time.  Also, prior to discharge, we will remove every other  staple in his arm incision, and the remainder of his staples will be  removed at his appointment with Dr. Donata Clay in  the office.   DISCHARGE MEDICATIONS:  Unchanged from the previously dictated discharge  summary with the exception that he has been taken off Toprol XL 25 mg  daily and guaifenesin 600 mg b.i.d..  He has been started on Lopressor  25 mg b.i.d.      Coral Ceo, P.A.      Kerin Perna, M.D.  Electronically Signed    GC/MEDQ  D:  11/08/2006  T:  11/08/2006  Job:  [id number]   cc:   Veverly Fells. Excell Seltzer, MD  Kerin Perna, M.D.

## 2010-12-19 NOTE — Cardiovascular Report (Signed)
NAME:  PAO, HAFFEY NO.:  [id number]   MEDICAL RECORD NO.:  [id number]                   PATIENT TYPE:  INP   LOCATION:  2110                                 FACILITY:  [facility]   PHYSICIAN:  Charlies Constable, M.D. [facility]              DATE OF BIRTH:  08/28/1950   DATE OF PROCEDURE:  09/19/2002  DATE OF DISCHARGE:                              CARDIAC CATHETERIZATION   PROCEDURE PERFORMED:  Cardiac catheterization and percutaneous coronary  intervention.   CLINICAL HISTORY:  The patient is 60 years old and has no prior history of  known heart disease though he does have risk factors including smoking.  He  was admitted with chest pain and positive troponins and was scheduled for  evaluation with angiography today.   DESCRIPTION OF PROCEDURE:  The procedure was performed via the right femoral  artery using an arterial sheath and 6 French preformed coronary catheters.  A front wall arterial puncture was performed and Omnipaque contrast was  used. After completion of the diagnostic study we made a decision to proceed  with intervention on the circumflex marginal vessel.   The patient was given Angiomax bolus in infusion and had been on Plavix  prior to the procedure.  We used a CLS 3.5, 6 [place] guiding catheter with  side holes and a short luge wire.  We crossed the lesion in the proximal  portion of the circumflex marginal vessel without too much difficulty.  We  initially went in with a 2.25 x 10 mm Cutting Balloon and performed a total  of three cuts up to 10 atmospheres for 30 seconds.  Repeat diagnostic  studies were then performed with a guiding catheter.  This resulted in  somewhat slow flow.  We gave intracoronary verapamil which improved the flow  and then we went in with a 2.25 x 20 mm Maverick.  We deployed this with two  inflations up to 8 atmospheres for 30 seconds.  Repeat diagnostic studies  were then performed with the guiding catheter.  The  patient tolerated the  procedure well and left the laboratory in satisfactory condition.   RESULTS:  1. The left main coronary artery:  The left main coronary artery was free of     significant disease.  2. Left anterior descending:  The left anterior descending artery gave rise     to a large diagonal branch, two septal perforators and two more diagonal     branches. There was 50% narrowing in the mid LAD at the second septal     perforator.  There was no other major obstruction.  3. Circumflex artery:  The circumflex artery gave rise to a large marginal     branch and two small posterolateral branches.  There was 95% stenosis in     the proximal portion of the first marginal branch.  4. Right coronary artery:  The right coronary is  a dominant vessel that gave     rise to two right ventricular branches, a posterior descending branch and     a posterolateral branch.  There was 30% segmental narrowing in the mid     vessel.  There was 40% narrowing at the ostium of the posterior     descending branch and 40% narrowing at the beginning portion of the     posterolateral branch.   LEFT VENTRICULOGRAM:  The left ventriculogram performed in the RAO  projection showed good wall motion with no evidence of hypokinesis.  The  estimated ejection fraction was 55%.   The aortic pressure was 141/85 with a mean of 110.  Left ventricular  pressure was 141/17.   Following PTCA of the lesion in the marginal branch and the circumflex  artery the stenosis improved from 95% to 10%, and the flow improved from  TIMI-2 to TIMI-3 flow.   DISPOSITION:  The patient was returned to the postangioplasty unit for  further observation.                                                    Charlies Constable, M.D. [facility]    BB/MEDQ  D:  09/19/2002  T:  09/20/2002  Job:  [id number]   cc:   Azalee Course  [address] [place].  [place]  [place] [address]  Fax: [phone]   Pricilla Riffle, M.D. [facility]   Cardiopulmonary  Lab

## 2010-12-19 NOTE — H&P (Signed)
NAMEGABRYEL, [name] NO.:  [id number]   MEDICAL RECORD NO.:  [id number]          PATIENT TYPE:  OIB   LOCATION:  2899                         FACILITY:  [facility]   PHYSICIAN:  Veverly Fells. Excell Seltzer, MD  DATE OF BIRTH:  1951/05/24   DATE OF ADMISSION:  10/28/2006  DATE OF DISCHARGE:                              HISTORY & PHYSICAL   PRIMARY CARE PHYSICIAN:  [facility], Dr. Onalee Hua.   PHYSICIANS:  Primary cardiologist Dr. Dietrich Pates.   CHIEF COMPLAINT:  Chest pain.   HISTORY OF PRESENT ILLNESS:  [name] [name] is a 60 year old male with a  previous history of coronary artery disease. He states that he had not  been exercising, but began walking several weeks ago. He developed  exertional chest pain with the walking and then when he had chest pain  with significantly less exertion that did not resolve quickly he went to  the emergency room.   [name] [name] states that he was laughing and talking with friends and  developed substernal chest pain. He describes it as sort of a dull ache.  There are no associated symptoms. He is unable to quantify it. He took  two sublingual nitroglycerin that he had, but states that they were from  2004 and did not feel that they were active. When his symptoms did not  resolve, he went to the [facility] emergency room where he was  admitted. This was on October 26, 2006. His symptoms improved with  sublingual nitroglycerin there and he was admitted. He was discharged on  October 27, 2006. He was started on a nitroglycerin patch and continued on  his other home medications. His cardiac enzymes were negative for MI. He  had a stress test that showed inferoapical ischemia and an EF 55%.  Because of the abnormal stress test and chest pain, he was referred for  catheterization and is here today for an outpatient procedure. Since he  has been on a nitroglycerin patch, he has been pain free.   PAST MEDICAL HISTORY:  1. Status post non-ST  segment elevation MI in February 2004 with PTCA      to the OM reducing the stenosis from 95% to less than 10%.  2. Residual coronary artery disease in the RCA at 30% and PDA 40%, PLA      40%, mid LAD 50%.  3. Hyperlipidemia with lab work at [facility] showing total      cholesterol 177, triglycerides 197, HDL 33, LDL 105.  4. Hypertension.  5. Gastroesophageal reflux disease.  6. Chronic back pain.  7. Family history of CVA.   SURGICAL HISTORY:  He is status post cardiac catheterization as well as  cholecystectomy and hernia repair.   ALLERGIES:  No known drug allergies.   MEDICATIONS:  1. Aspirin 325 mg daily.  2. Metoprolol 25 mg q.6 h. (the patient is taking daily).  3. Omeprazole 20 mg a day.  4. Vicodin p.r.n.  5. Nitroglycerin patch 0.4 mg per hour.   SOCIAL HISTORY:  He lives in [place] with his wife and works as  a  forklift driver as well as part-time at [employer]. He has approximately  30 pack-year history of tobacco use and denies EtOH or drug abuse,  although a drug screen performed at [facility] after admission was  positive for benzodiazepines, opiates, and amphetamines.   FAMILY HISTORY:  Father died at age 26 of a CVA with no history of  coronary artery disease, and his mother died of multiple sclerosis in  her 78s. He has no siblings with coronary artery disease, although he  has had two aunts on his mother's side that had heart attacks in their  54s.   REVIEW OF SYSTEMS:  Significant for an episode of numbness in the right  half of his body for which he went to the emergency room where he was  evaluated and told he was okay, although he was requested to follow up  with primary care and cardiology and had not done so. He has a headache  secondary to the nitroglycerin. The chest pain is described above. He  says he rarely coughs and does not wheeze. He has chronic arthralgias  and lower back pain for which he takes occasional Vicodin.  He has   reflux symptoms which he states are better since he started on the  omeprazole recently, but denies hematemesis or melena. Review of Systems  is, otherwise, negative.   PHYSICAL EXAMINATION:  VITAL SIGNS:  Temperature 97.1, blood pressure  115/78, pulse 64, respiratory rate 20.  GENERAL:  He is a well-developed African-American male in no acute  distress.  HEENT:  Head normocephalic, atraumatic with extraocular movements  intact. Sclerae clear. Nares without discharge.  NECK:  There is no lymphadenopathy, thyromegaly, bruit or JVD noted.  CVA:  Heart is regular rate and rhythm with an S1, S2 and no significant  murmur, rub or gallop is noted. Distal pulses are 2+ in all four  extremities, no femoral bruits are appreciated.  LUNGS:  Essentially clear to auscultation bilaterally.  SKIN:  No rashes or lesions are noted.  ABDOMEN:  Soft and nontender with active bowel sounds.  EXTREMITIES:  There is no cyanosis, clubbing or edema noted.  MUSCULOSKELETAL:  There is no joint deformity or effusions, no spine or  CVA tenderness.  NEUROLOGICAL:  He is alert and oriented. Cranial nerves II-XII grossly  intact.   Chest x-ray at [facility] showed no acute disease.   ECG is pending at the time of dictation.   LABORATORY DATA:  Laboratory values (performed at [facility]):  Urinalysis  was normal. Lipid profile as described above. Sodium 138, potassium 3.8,  chloride 100, BUN 15, creatinine 1, glucose 113. INR 0.98, PTT 31.  Hemoglobin 15.3, hematocrit 45.8, WBC 6.1, platelets 200.   IMPRESSION:  Unstable anginal pain:  The patient has progressive anginal  symptoms as well as an abnormal Myoview. He is here today for cardiac  catheterization. Further evaluation and treatment will depend on the  results of the procedure with consideration given to adding fenofibrate.      [name] Demark, PA-C     Veverly Fells. Excell Seltzer, MD  Electronically Signed    RB/MEDQ  D:  10/28/2006  T:   10/28/2006  Job:  [id number]

## 2010-12-19 NOTE — Discharge Summary (Signed)
NAME:  [name] [name], [name] [name] NO.:  [id number]   MEDICAL RECORD NO.:  [id number]                   PATIENT TYPE:  INP   LOCATION:  2110                                 FACILITY:  [facility]   PHYSICIAN:  Pricilla Riffle, M.D. [facility]             DATE OF BIRTH:  06-12-1951   DATE OF ADMISSION:  09/18/2002  DATE OF DISCHARGE:  09/20/2002                           DISCHARGE SUMMARY - REFERRING   PROCEDURES:  1. Cardiac catheterization.  2. Coronary arteriogram.  3. Left ventriculogram.  4. Percutaneous transluminal coronary angioplasty of one vessel.   HOSPITAL COURSE:  The patient is a 60 year old male with no known history of  coronary artery disease.  He went to [facility] for chest pain and  while he was there he had CK-MB's that were within normal limits but his  troponin-I was elevated.  Repeat enzymes showed a consistent elevation and  the patient was referred to [facility] for further evaluation and  catheterization.  Approximately six days prior to admission, he had had  onset of substernal chest pain that waxed and waned but was up to a 10/10 at  times and also radiated to his left arm.  Tums and over-the-counter GI RX  was no help and he did not get relief until he went to [facility] Emergency  Room and received nitroglycerin.   The cardiac catheterization showed a normal left main and the LAD with a 50%  stenosis with the diagonals having some irregularities.  The RCA had a 30%  lesion and there was a 40% lesion in the PDA, as well as a 40% lesion in the  PLA.  The circumflex had no critical disease but the OM had a 95% stenosis.  This was treated with PTCA reducing the stenosis to less than 10% with TIMI-  3 flow.  He had some inferior hypokinesis on his left ventriculogram and his  EF was 55%.   The patient tolerated the procedure well and the sheath was removed without  difficulty.  He had had a lipid profile checked at [facility] and  it  showed triglycerides of 248 with other values being within normal limits.  He was started on a statin for hypertriglyceridemia and MI.  Additionally, a  smoking cessation consult was called but the patient stated that he would  quit using tobacco, as he rarely smokes more than three or four cigarettes a  day.  Also, because his body mass index was 31, the patient was given a  dietary consultation and information as well.  He was seen by cardiac rehab  and ambulated greater than 600 feet with a blood pressure of 163/83 and a  heart rate of 90 post ambulation.  He had no chest pain with ambulation.  Guidelines were discussed including MI, angina, use of nitroglycerin, and  calling 911, as well as risk factor modification.  The patient seemed  motivated to go forward with this.   Since the patient had no chest pain with ambulation and his blood pressure  was under better control when his medications were started, he was  considered stable for discharge on September 20, 2002 with outpatient  followup arranged.   LABORATORY VALUES:  Hemoglobin 15, hematocrit 44.2, WBC 5.1, platelets 199.  Sodium 139, potassium 3.5, chloride 103, CO2 22, BUN 6, creatinine 0.9,  glucose 133.   DISCHARGE CONDITION:  Stable.    DISCHARGE DIAGNOSES:  1. Non-Q wave myocardial infarction, status post percutaneous transluminal     coronary angioplasty to the first obtuse marginal this admission.  2. Residual coronary artery disease of 30%-50% in the left anterior     descending, posterior descending artery, posterolateral artery, and right     coronary artery.  3. Preserved left ventricular function with inferior hypokinesis and an     ejection fraction of 55%.  4. Hypertriglyceridemia.  5. Chronic back pain.  6. History of tobacco use.  7. Status post cholecystectomy and umbilical hernia repair.  8. Family history of myocardial infarction and cerebrovascular accident in     56s and 48s.  9. Borderline  obesity with a body mass index of 31.   DISCHARGE INSTRUCTIONS:  1. His activity level is to include no driving for three days and he can     return to work February 25.  2. He is to stick to a low-fat diet with 60 g of fat per day.  3. He is to call the office for problems with the catheterization site.  4. He is not to use tobacco.  5. He is to follow up with Dr. Shelby Mattocks as scheduled.  6. He has a P.A. appointment for Dr. Tenny Craw on March 4 at 10:30.   DISCHARGE MEDICATIONS:  1. Atenolol 25 mg daily.  2. Flexeril 5 mg b.i.d.  3. Ultram p.r.n.  4. Norco p.r.n.  5. Lipitor 20 mg daily.  6. Nitroglycerin p.r.n.  7. Aspirin 325 mg daily.  8. Plavix 75 mg daily.     Lavella Hammock, P.A. [facility]                  Pricilla Riffle, M.D. [facility]    RG/MEDQ  D:  09/20/2002  T:  09/20/2002  Job:  [id number]   cc:   Pricilla Riffle, M.D. [facility]  [address] [place].  [place]  [place] [address]  Fax: [phone]

## 2010-12-19 NOTE — Consult Note (Signed)
NAME:  [name] [name], [name] [name] NO.:  [id number]   MEDICAL RECORD NO.:  [id number]          PATIENT TYPE:  INP   LOCATION:  2005                         FACILITY:  [facility]   PHYSICIAN:  Kerin Perna, M.D.  DATE OF BIRTH:  [birth year]   DATE OF CONSULTATION:  10/28/2006  DATE OF DISCHARGE:                                 CONSULTATION   REQUESTING PHYSICIAN:  Veverly Fells. Excell Seltzer, MD.   PRIMARY CARE PHYSICIAN:  Dr. Onalee Hua at [facility].   REASON FOR CONSULTATION:  Severe three-vessel coronary artery disease  with unstable angina.   CHIEF COMPLAINT:  Chest pain.   HISTORY OF PRESENT ILLNESS:  I was asked to evaluate this 60 year old  black male smoker for potential surgical coronary revascularization for  recently diagnosed severe three-vessel coronary artery disease.  The  patient had a cardiac cath four years ago with an angioplasty of the  obtuse marginal from 95% to 10%.  He has done well until recently when  he developed exertional chest pain which progressed to resting chest  pain on March 25.  He presented to the [facility] emergency department  where his enzymes were negative and EKG was nonspecific.  He was  evaluated with a stress test which was positive for ischemia and  subsequently referred to [facility] for catheterization and  evaluation of coronary artery disease.  Catheterization was performed  today by Dr. Excell Seltzer, which showed severe three-vessel coronary disease  with 80-90% stenosis of the LAD with diffuse disease pattern, 80%  stenosis of the obtuse marginal and 80% stenosis of the PDPL bifurcation  of the distal right coronary.  His EF was normal, and LVEDP was 14.  Because of his severe three-vessel coronary disease he was felt to be a  potential candidate for surgical revascularization.  The patient denies  history of diabetes or hyperlipidemia but does have hypertension and  smoking.   PAST MEDICAL HISTORY:  1. Hypertension.  2.  GERD.  3. Chronic back pain.  4. Three-vessel coronary artery disease with unstable angina.  5. No known drug allergies.  6. Status post cholecystectomy and hernia repair.   MEDICATIONS:  1. Aspirin 325 mg.  2. Lopressor 25 mg b.i.d.  3. Prilosec 25 mg a day.  4. Vicodin p.r.n.  5. Nitroglycerin patch 0.4 mg daily.   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  He smokes a half pack  of cigarettes a day but denies alcohol use.  His drug screen was  positive for amphetamine and opiates and benzodiazepines at his home  hospital.   FAMILY HISTORY:  Positive for stroke, hypertension.  Negative for  diabetes.  Distant relatives have had myocardial infarctions, however.   REVIEW OF SYSTEMS:  He had an episode 3-6 months ago of transient right-  sided numbness, which was evaluated at [facility] with a head CT  scan and cardiac enzymes that were negative.  He was told he should see  a neurologist.  He has had no similar episodes  since that time.  He  denies any recent respiratory infections.  He has an upper dental plate  and some missing teeth in his mandible.  He denies difficulty  swallowing.  He denies history of thoracic trauma.  He denies change in  bowel habits or blood per rectum.  He denies diabetes or thyroid  disease.  Had DVT, claudication or TIA.   PHYSICAL EXAMINATION:  VITAL SIGNS:  Patient is 5 feet 8-1/2 inches,  weighs 198 pounds, blood pressure 118/70, pulse 60, respirations 18,  GENERAL APPEARANCE:  Well-developed, well-nourished male in no distress  following cardiac catheterization.  HEENT:  Normocephalic.  Full EOMs.  Pharynx is clear.  NECK:  Supple, without JVD, mass or carotid bruit.  LYMPHATICS:  No palpable supraclavicular or axillary adenopathy.  CHEST:  Thorax is without deformity.  Breath sounds are clear.  CARDIAC:  Regular rhythm without S3 gallop, murmur or rub.   ABDOMEN:  Soft, nontender, without mass.  EXTREMITIES:  No clubbing, cyanosis or edema.  Peripheral pulses are 2+  in all extremities.  NEUROLOGIC:  Oriented, alert, without focal motor deficit.   LABORATORY DATA:  I reviewed the coronary arteriogram.  He has severe  three-vessel disease with preserved LV function.  I feel he would  benefit from surgical revascularization.  His creatinine is 1.0 with a  hematocrit of 45%, and a chest x-ray is pending.   IMPRESSION/PLAN:  The patient was offered multivessel bypass grafting.  He, at this point, is ready to proceed.  We will schedule his surgery  for the first of next week.  Thank you for the consultation.      Kerin Perna, M.D.  Electronically Signed     PV/MEDQ  D:  10/28/2006  T:  10/28/2006  Job:  [id number]

## 2010-12-19 NOTE — H&P (Signed)
NAME:  [name] [name], [name] [name] NO.:  [id number]   MEDICAL RECORD NO.:  [id number]                   PATIENT TYPE:  INP   LOCATION:  2110                                 FACILITY:  [facility]   PHYSICIAN:  Pricilla Riffle, M.D. [facility]             DATE OF BIRTH:  1950/12/07   DATE OF ADMISSION:  09/18/2002  DATE OF DISCHARGE:                                HISTORY & PHYSICAL   IDENTIFICATION:  The patient is a 60 year old gentleman with no known  history of coronary artery disease.  He presents in transfer from [facility]  for cardiac catheterization   HISTORY OF PRESENT ILLNESS:  The patient notes about six days ago, he had  the onset of substernal chest pain, 2/10 becoming 10/10.  It waxed and  waned.  Left arm pain at that time.  He took Tums and over-the-counter GI  therapy without any help.  Question if there was some exacerbation with  food.  Question with some activities that he got quite short of breath  walking across a parking lot.   He went to the emergency room, and he had nitroglycerin given, and the pain  was relieved.  Note laboratory evaluation in [facility], CK-MB's were  negative.  However, troponins were elevated at 0.32 going to 0.91, 1.2,  0.85.  EKG showed no changes.   ALLERGIES:  None.   MEDICATIONS:  Prior to admission, __________  5/325 every day, Flexeril 5  b.i.d. and Ultram p.r.n.   Currently, aspirin x1, Fragmin, atenolol 25, Colace b.i.d., Klonopin p.r.n.,  Lortab p.r.n., Protonix every day   PAST MEDICAL HISTORY:  Low-back pain, chronic tobacco use.   SOCIAL HISTORY:  The patient lives in [place].  He is married.  He smokes  about half pack per day for the past 10 years.   FAMILY HISTORY:  Father died of a CVA at age 58.  Mother died of multiple  sclerosis at age 22.  Two aunts with MI's in the 28's.   REVIEW OF SYSTEMS:  Constitutional:  Negative for fever and chills.  HEENT  negative skin, negative.  Cardiopulmonary was  noted.  Chest pain +/-  activity, question slight increase with food.  GU:  Negative.  Neurologic/Psyche:  Anxiety at times.  Musculoskeletal:  As noted above.  GI:  History of reflux.  Bowels moving regularly.  Endocrine:  Negative for  thyroid problems, no diabetes.   PHYSICAL EXAMINATION:  GENERAL:  The patient is in no acute distress, denies  pain.  VITAL SIGNS:  Blood pressure 136/84, pulse 72, temperature 98.  HEENT:  Normocephalic, atraumatic.  PERRL.  NECK:  No thyromegaly, no bruits.  LUNGS:  Clear.  CARDIAC:  Regular rate and rhythm, normal S1 and S2, no S3, S4 or murmurs.  ABDOMEN:  Exam is benign.  No hepatomegaly, no bruits.  EXTREMITIES:  Distal pulses 2+, no lower extremity edema.  NEUROLOGIC:  Alert and oriented, Cranial nerves II-XII are intact.   X-RAY:  Chest x-ray, no acute disease, mainly scarring at base.  EKG:  Normal sinus rhythm.   LABORATORY DATA:  Significant for hemoglobin of 14.9, WBC 6.1, BUN and  creatinine of 10 and 0.9.  INR of 1.  CK-MB and troponin as noted above.   IMPRESSION:  1. The patient is a 60 year old gentleman with a history of chest pain.  He     has atypical features despite his troponin is elevated.  I agree with     cardiac catheterization to define anatomy.  We will place on low-     molecular weight heparin, aspirin, and also give him Plavix today.  Plan     for early morning.                                               Pricilla Riffle, M.D. [facility]    PVR/MEDQ  D:  09/18/2002  T:  09/18/2002  Job:  [id number]

## 2011-04-22 ENCOUNTER — Encounter: Payer: Self-pay | Admitting: Cardiovascular Disease

## 2011-04-24 ENCOUNTER — Ambulatory Visit: Payer: Medicare Other | Admitting: Cardiovascular Disease

## 2011-04-29 ENCOUNTER — Ambulatory Visit: Payer: Medicare Other | Admitting: Pain Medicine

## 2011-05-05 LAB — CBC
HCT: 42.5
Hemoglobin: 14.5
MCV: 84.8
MCV: 86.5
Platelets: 149 — ABNORMAL LOW
RBC: 5.61
RDW: 14.4
WBC: 6.9

## 2011-05-05 LAB — BASIC METABOLIC PANEL
BUN: 7
CO2: 28
Chloride: 104
Glucose, Bld: 111 — ABNORMAL HIGH
Potassium: 3.3 — ABNORMAL LOW

## 2011-05-05 LAB — PROTIME-INR
INR: 1
Prothrombin Time: 13.1

## 2011-05-05 LAB — COMPREHENSIVE METABOLIC PANEL
ALT: 34
AST: 41 — ABNORMAL HIGH
Albumin: 3.8
Alkaline Phosphatase: 83
CO2: 28
Chloride: 103
GFR calc Af Amer: >60
GFR calc non Af Amer: >60
Potassium: 3.4 — ABNORMAL LOW
Sodium: 140
Total Bilirubin: 1

## 2011-05-05 LAB — CARDIAC PANEL(CRET KIN+CKTOT+MB+TROPI)
CK, MB: 1.2
Relative Index: 0.7
Total CK: 171
Troponin I: 0.01

## 2011-05-05 LAB — APTT: aPTT: 30

## 2011-05-14 ENCOUNTER — Ambulatory Visit: Payer: Medicare Other | Admitting: Pain Medicine

## 2011-06-01 ENCOUNTER — Ambulatory Visit: Payer: Medicare Other | Admitting: Pain Medicine

## 2011-06-01 ENCOUNTER — Ambulatory Visit: Payer: Medicare Other | Admitting: Cardiovascular Disease

## 2011-06-01 ENCOUNTER — Other Ambulatory Visit: Payer: Medicare Other | Admitting: *Deleted

## 2011-06-02 ENCOUNTER — Ambulatory Visit (INDEPENDENT_AMBULATORY_CARE_PROVIDER_SITE_OTHER): Payer: Medicare Other | Admitting: *Deleted

## 2011-06-02 DIAGNOSIS — E7800 Pure hypercholesterolemia, unspecified: Secondary | ICD-10-CM

## 2011-06-02 DIAGNOSIS — E785 Hyperlipidemia, unspecified: Secondary | ICD-10-CM

## 2011-06-02 LAB — HEPATIC FUNCTION PANEL
AST: 53 U/L — ABNORMAL HIGH (ref 0–37)
Albumin: 3.9 g/dL (ref 3.5–5.2)
Alkaline Phosphatase: 80 U/L (ref 39–117)
Bilirubin, Direct: 0 mg/dL (ref 0.0–0.3)

## 2011-06-02 LAB — BASIC METABOLIC PANEL
BUN: 19 mg/dL (ref 6–23)
CO2: 27 mEq/L (ref 19–32)
Calcium: 8.9 mg/dL (ref 8.4–10.5)
Creatinine, Ser: 0.7 mg/dL (ref 0.4–1.5)

## 2011-06-02 LAB — LIPID PANEL
Cholesterol: 168 mg/dL (ref 0–200)
Total CHOL/HDL Ratio: 3
Triglycerides: 96 mg/dL (ref 0.0–149.0)

## 2011-06-18 ENCOUNTER — Ambulatory Visit: Payer: Medicare Other | Admitting: Pain Medicine

## 2011-06-29 ENCOUNTER — Ambulatory Visit: Payer: Medicare Other | Admitting: Cardiovascular Disease

## 2011-07-20 ENCOUNTER — Telehealth: Payer: Self-pay | Admitting: Cardiovascular Disease

## 2011-07-20 NOTE — Telephone Encounter (Signed)
New message:  Please call patient back regarding his lab work after 3:00 this pm.  He has a January appt but would like to get lab results before that date.

## 2011-07-20 NOTE — Telephone Encounter (Signed)
Notified pt of lab results.  Charlett Merkle W. Dorothye Berni, LPN  

## 2011-07-22 ENCOUNTER — Ambulatory Visit: Payer: Medicare Other | Admitting: Pain Medicine

## 2011-07-22 ENCOUNTER — Other Ambulatory Visit: Payer: Self-pay | Admitting: Cardiovascular Disease

## 2011-08-10 ENCOUNTER — Encounter: Payer: Self-pay | Admitting: Cardiovascular Disease

## 2011-08-10 ENCOUNTER — Ambulatory Visit (INDEPENDENT_AMBULATORY_CARE_PROVIDER_SITE_OTHER): Payer: Medicare Other | Admitting: Cardiovascular Disease

## 2011-08-10 DIAGNOSIS — I25810 Atherosclerosis of coronary artery bypass graft(s) without angina pectoris: Secondary | ICD-10-CM

## 2011-08-10 DIAGNOSIS — E7800 Pure hypercholesterolemia, unspecified: Secondary | ICD-10-CM

## 2011-08-10 DIAGNOSIS — E785 Hyperlipidemia, unspecified: Secondary | ICD-10-CM

## 2011-08-10 DIAGNOSIS — I10 Essential (primary) hypertension: Secondary | ICD-10-CM

## 2011-08-10 NOTE — Progress Notes (Signed)
HPI:  A 61 year old gentleman presenting for followup evaluation.  The patient has coronary artery disease and underwent coronary bypass surgery in 2008. He had early closure of his vein grafts and required stenting of the left internal mammary artery to LAD graft. His last cardiac catheterization was in 2011 and it demonstrated patency of his LIMA to LAD as well as patency of his stent site.  The patient's last lipid panel was drawn in October 2012 and this demonstrated a total cholesterol 168, triglycerides 96, HDL 54, and LDL 94. He has had problems with statin intolerance but he has been able to take pravastatin without difficulty.  From a symptomatic perspective the patient has been doing well. He had a recent episode of chest pain associated with stress but this was transient and localized in nature. He has been physically active and has had no exertional symptoms of chest pain or pressure. He denies dyspnea, edema, or palpitations.  Outpatient Encounter Prescriptions as of 08/10/2011  Medication Sig Dispense Refill  . aspirin 325 MG tablet Take 325 mg by mouth daily.        . hydrochlorothiazide (HYDRODIURIL) 25 MG tablet TAKE ONE TABLET BY MOUTH DAILY.  30 tablet  4  . metoprolol (TOPROL-XL) 50 MG 24 hr tablet Take 50 mg by mouth daily.        . nitroGLYCERIN (NITROSTAT) 0.4 MG SL tablet Place 1 tablet (0.4 mg total) under the tongue every 5 (five) minutes as needed for chest pain.  25 tablet  2  . oxyCODONE-acetaminophen (PERCOCET) 7.5-325 MG per tablet Take 1 tablet by mouth every 4 (four) hours as needed.        . pantoprazole (PROTONIX) 40 MG tablet Take 40 mg by mouth daily.        Marland Kitchen PLAVIX 75 MG tablet TAKE ONE TABLET BY MOUTH DAILY  30 tablet  7  . pravastatin (PRAVACHOL) 40 MG tablet Take 40 mg by mouth daily.        Marland Kitchen aspirin 81 MG tablet Take 81 mg by mouth daily.        Marland Kitchen DISCONTD: meloxicam (MOBIC) 15 MG tablet Take 15 mg by mouth daily.          No Known Allergies  Past Medical  History  Diagnosis Date  . CAD (coronary artery disease)     CABG  . Hyperlipidemia   . Hypertension   . GERD (gastroesophageal reflux disease)   . Chronic pain syndrome     ROS: Negative except as per HPI  BP 112/78  Pulse 64  Ht 5\' 8"  (1.727 m)  Wt 92.443 kg (203 lb 12.8 oz)  BMI 30.99 kg/m2  PHYSICAL EXAM: Pt is alert and oriented, NAD HEENT: normal Neck: JVP - normal, carotids 2+= without bruits Lungs: CTA bilaterally CV: RRR without murmur or gallop Abd: soft, NT, Positive BS, no hepatomegaly Ext: no C/C/E, distal pulses intact and equal Skin: warm/dry no rash  EKG:  Normal sinus rhythm 64 beats per minute, right bundle branch block, no significant change from previous tracings.  ASSESSMENT AND PLAN:

## 2011-08-10 NOTE — Patient Instructions (Signed)
Your physician wants you to follow-up in: 6 MONTHS.  You will receive a reminder letter in the mail two months in advance. If you don't receive a letter, please call our office to schedule the follow-up appointment.  Your physician recommends that you return for a FASTING lipid and liver profile in 6 MONTHS-- nothing to eat or drink after midnight, lab opens at 8:30.  Your physician recommends that you continue on your current medications as directed. Please refer to the Current Medication list given to you today.

## 2011-08-16 NOTE — Assessment & Plan Note (Signed)
Stable without exertional angina. The patient will continue on his current medical program. He was encouraged to continue with a walking program and to obtain regular physical exercise.

## 2011-08-16 NOTE — Assessment & Plan Note (Signed)
Blood pressure is under good control. His BP control has been much better since he has lost significant weight.

## 2011-08-16 NOTE — Assessment & Plan Note (Signed)
Lipids reviewed and they are at goal. His transaminases have been mildly elevated and we will continue to follow this every 6 months.

## 2011-10-12 ENCOUNTER — Ambulatory Visit: Payer: Self-pay | Admitting: Pain Medicine

## 2011-12-03 ENCOUNTER — Other Ambulatory Visit: Payer: Self-pay | Admitting: Cardiovascular Disease

## 2011-12-03 NOTE — Telephone Encounter (Signed)
..   Requested Prescriptions   Pending Prescriptions Disp Refills  . metoprolol succinate (TOPROL-XL) 50 MG 24 hr tablet [Pharmacy Med Name: METOPROLOL SUCC ER 50 MG TAB] 30 tablet 5    Sig: TAKE 1 TABLET BY MOUTH ONCE A DAY

## 2011-12-10 ENCOUNTER — Other Ambulatory Visit: Payer: Self-pay | Admitting: Cardiovascular Disease

## 2011-12-14 ENCOUNTER — Other Ambulatory Visit: Payer: Self-pay | Admitting: Cardiovascular Disease

## 2012-01-19 ENCOUNTER — Ambulatory Visit: Payer: Self-pay | Admitting: Pain Medicine

## 2012-01-28 ENCOUNTER — Ambulatory Visit: Payer: Self-pay | Admitting: Pain Medicine

## 2012-03-08 ENCOUNTER — Other Ambulatory Visit: Payer: Medicare Other

## 2012-03-15 ENCOUNTER — Encounter: Payer: Self-pay | Admitting: Cardiovascular Disease

## 2012-03-15 ENCOUNTER — Ambulatory Visit (INDEPENDENT_AMBULATORY_CARE_PROVIDER_SITE_OTHER): Payer: Medicare Other | Admitting: Cardiovascular Disease

## 2012-03-15 VITALS — BP 132/80 | HR 78 | Ht 68.0 in | Wt 198.0 lb

## 2012-03-15 DIAGNOSIS — I10 Essential (primary) hypertension: Secondary | ICD-10-CM

## 2012-03-15 DIAGNOSIS — I25810 Atherosclerosis of coronary artery bypass graft(s) without angina pectoris: Secondary | ICD-10-CM

## 2012-03-15 DIAGNOSIS — E785 Hyperlipidemia, unspecified: Secondary | ICD-10-CM

## 2012-03-15 DIAGNOSIS — R079 Chest pain, unspecified: Secondary | ICD-10-CM

## 2012-03-15 MED ORDER — CLOPIDOGREL BISULFATE 75 MG PO TABS: 75.0000 mg | ORAL_TABLET | Freq: Every day | ORAL | Status: DC

## 2012-03-15 MED ORDER — HYDROCHLOROTHIAZIDE 25 MG PO TABS: 25.0000 mg | ORAL_TABLET | Freq: Every day | ORAL | Status: DC

## 2012-03-15 MED ORDER — METOPROLOL SUCCINATE ER 50 MG PO TB24: 50.0000 mg | ORAL_TABLET | Freq: Every day | ORAL | Status: DC

## 2012-03-15 MED ORDER — PANTOPRAZOLE SODIUM 40 MG PO TBEC: 40.0000 mg | DELAYED_RELEASE_TABLET | Freq: Every day | ORAL | Status: DC

## 2012-03-15 MED ORDER — NITROGLYCERIN 0.4 MG SL SUBL: 0.4000 mg | SUBLINGUAL_TABLET | SUBLINGUAL | Status: DC | PRN

## 2012-03-15 MED ORDER — PRAVASTATIN SODIUM 40 MG PO TABS: 40.0000 mg | ORAL_TABLET | Freq: Every day | ORAL | Status: DC

## 2012-03-15 NOTE — Patient Instructions (Addendum)
Your physician recommends that you return for a FASTING lipid profile and liver profile--nothing to eat or drink after midnight, lab opens at 8:30  Your physician has recommended you make the following change in your medication: START Protonix 40mg  take one by mouth daily  Your physician wants you to follow-up in: 1 YEAR with Dr Excell Seltzer.  You will receive a reminder letter in the mail two months in advance. If you don't receive a letter, please call our office to schedule the follow-up appointment.  Please call back in 1-2 WEEKS if Chest Pain is not improved.

## 2012-03-15 NOTE — Progress Notes (Signed)
   HPI:  61 year old gentleman presenting for followup evaluation. The patient is followed for coronary artery disease with history of coronary bypass surgery in 2008 and PCI of the LIMA to LAD anastomotic site a few years later. His last cardiac catheterization in 2011 demonstrating continued stent patency.  The patient continues to do well from a symptomatic perspective. He is noted some increase in epigastric discomfort and chest discomfort since he has been off of proton X. He is competent the symptoms are related to gastroesophageal reflux disease. He denies exertional chest pain or pressure. He denies dyspnea, edema, orthopnea, or PND. He's tolerating his current medical program without problems.  Outpatient Encounter Prescriptions as of 03/15/2012  Medication Sig Dispense Refill  . aspirin 325 MG tablet Take 325 mg by mouth daily.        . clopidogrel (PLAVIX) 75 MG tablet TAKE ONE TABLET BY MOUTH DAILY  30 tablet  6  . hydrochlorothiazide (HYDRODIURIL) 25 MG tablet TAKE ONE TABLET BY MOUTH DAILY.  30 tablet  4  . metoprolol succinate (TOPROL-XL) 50 MG 24 hr tablet TAKE 1 TABLET BY MOUTH ONCE A DAY  30 tablet  5  . nitroGLYCERIN (NITROSTAT) 0.4 MG SL tablet Place 0.4 mg under the tongue every 5 (five) minutes as needed.      Marland Kitchen oxyCODONE-acetaminophen (PERCOCET) 7.5-325 MG per tablet Take 1 tablet by mouth every 4 (four) hours as needed.        . pantoprazole (PROTONIX) 40 MG tablet Take 40 mg by mouth daily.       . pravastatin (PRAVACHOL) 40 MG tablet Take 40 mg by mouth daily.       Marland Kitchen DISCONTD: nitroGLYCERIN (NITROSTAT) 0.4 MG SL tablet Place 1 tablet (0.4 mg total) under the tongue every 5 (five) minutes as needed for chest pain.  25 tablet  2  . DISCONTD: aspirin 81 MG tablet Take 81 mg by mouth daily.          No Known Allergies  Past Medical History  Diagnosis Date  . CAD (coronary artery disease)     CABG  . Hyperlipidemia   . Hypertension   . GERD (gastroesophageal reflux  disease)   . Chronic pain syndrome     ROS: Negative except as per HPI  BP 132/80  Pulse 78  Ht 5\' 8"  (1.727 m)  Wt 89.812 kg (198 lb)  BMI 30.11 kg/m2  PHYSICAL EXAM: Pt is alert and oriented, NAD HEENT: normal Neck: JVP - normal, carotids 2+= without bruits Lungs: CTA bilaterally CV: RRR without murmur or gallop Abd: soft, NT, Positive BS, no hepatomegaly Ext: no C/C/E, distal pulses intact and equal Skin: warm/dry no rash  EKG:  Normal sinus rhythm with rare PVC, right bundle branch block, no significant change from previous tracing  ASSESSMENT AND PLAN:

## 2012-03-16 ENCOUNTER — Ambulatory Visit: Payer: Self-pay | Admitting: Pain Medicine

## 2012-03-17 ENCOUNTER — Other Ambulatory Visit: Payer: Medicare Other

## 2012-03-24 ENCOUNTER — Ambulatory Visit: Payer: Self-pay | Admitting: Pain Medicine

## 2012-03-29 NOTE — Assessment & Plan Note (Signed)
Stable on hydrochlorothiazide and metoprolol. The patient has lost weight and I think this is felt his blood pressure control as well.

## 2012-03-29 NOTE — Assessment & Plan Note (Signed)
Lipids have been at goal on pravastatin. He will continue the same. I have taken the liberty of writing him a 30 day prescription for protonix to help treat his gastroesophageal reflux symptoms.

## 2012-03-29 NOTE — Assessment & Plan Note (Signed)
Stable without exertional angina. I would be inclined to keep him on long-term dual antiplatelet therapy. He's had occlusion of all vein grafts and his LIMA to LAD is dependent on a stent at the anastomotic site.

## 2012-04-18 ENCOUNTER — Ambulatory Visit: Payer: Self-pay | Admitting: Pain Medicine

## 2012-04-25 ENCOUNTER — Ambulatory Visit: Payer: Self-pay | Admitting: Pain Medicine

## 2012-05-03 ENCOUNTER — Other Ambulatory Visit: Payer: Self-pay | Admitting: Cardiovascular Disease

## 2012-06-16 ENCOUNTER — Other Ambulatory Visit: Payer: Self-pay | Admitting: Cardiovascular Disease

## 2012-07-13 ENCOUNTER — Ambulatory Visit: Payer: Self-pay | Admitting: Pain Medicine

## 2012-07-21 ENCOUNTER — Ambulatory Visit: Payer: Self-pay | Admitting: Pain Medicine

## 2012-10-09 ENCOUNTER — Other Ambulatory Visit: Payer: Self-pay | Admitting: Cardiovascular Disease

## 2012-11-07 ENCOUNTER — Ambulatory Visit: Payer: Self-pay | Admitting: Pain Medicine

## 2012-11-24 ENCOUNTER — Ambulatory Visit: Payer: Self-pay | Admitting: Pain Medicine

## 2012-12-19 ENCOUNTER — Ambulatory Visit: Payer: Self-pay | Admitting: Pain Medicine

## 2013-03-24 ENCOUNTER — Ambulatory Visit (INDEPENDENT_AMBULATORY_CARE_PROVIDER_SITE_OTHER): Payer: Medicare Other | Admitting: Cardiovascular Disease

## 2013-03-24 ENCOUNTER — Encounter: Payer: Self-pay | Admitting: Cardiovascular Disease

## 2013-03-24 VITALS — BP 130/86 | HR 64 | Ht 68.0 in | Wt 203.0 lb

## 2013-03-24 DIAGNOSIS — I25810 Atherosclerosis of coronary artery bypass graft(s) without angina pectoris: Secondary | ICD-10-CM

## 2013-03-24 DIAGNOSIS — E7800 Pure hypercholesterolemia, unspecified: Secondary | ICD-10-CM

## 2013-03-24 NOTE — Progress Notes (Signed)
HPI:  62 year old gentleman presenting for followup evaluation. The patient is followed for coronary artery disease with history of coronary bypass surgery in 2008 and PCI of the LIMA to LAD anastomotic site a few years later. His last cardiac catheterization in 2011 demonstrating continued stent patency.  The patient is having recurrent chest pain again. He describes a dull ache in the center of the chest. This is not typically related to exertion. He has mild shortness of breath but no significant change. He is gaining some weight. He does not participate in any exercise. He denies edema, orthopnea, PND, or palpitations. There were no associated symptoms with his chest pain. Sometimes it feels like "indigestion."   Outpatient Encounter Prescriptions as of 03/24/2013  Medication Sig Dispense Refill  . aspirin 325 MG tablet Take 325 mg by mouth daily.        . clopidogrel (PLAVIX) 75 MG tablet Take 1 tablet (75 mg total) by mouth daily.  90 tablet  3  . gabapentin (NEURONTIN) 300 MG capsule TAKE ONE TABLET BY MOUTH DAILY AT NIGHT      . hydrochlorothiazide (HYDRODIURIL) 25 MG tablet TAKE ONE TABLET BY MOUTH DAILY.  30 tablet  4  . metoprolol succinate (TOPROL-XL) 50 MG 24 hr tablet Take 1 tablet (50 mg total) by mouth daily. Take with or immediately following a meal.  90 tablet  3  . nitroGLYCERIN (NITROSTAT) 0.4 MG SL tablet Place 1 tablet (0.4 mg total) under the tongue every 5 (five) minutes as needed.  25 tablet  6  . oxyCODONE-acetaminophen (PERCOCET) 7.5-325 MG per tablet Take 1 tablet by mouth every 4 (four) hours as needed.        . pantoprazole (PROTONIX) 40 MG tablet TAKE 1 TABLET BY MOUTH EVERY DAY  90 tablet  3  . pravastatin (PRAVACHOL) 40 MG tablet Take 1 tablet (40 mg total) by mouth daily.  90 tablet  3  . [DISCONTINUED] hydrochlorothiazide (HYDRODIURIL) 25 MG tablet Take 1 tablet (25 mg total) by mouth daily.  90 tablet  3   No facility-administered encounter medications on file  as of 03/24/2013.    No Known Allergies  Past Medical History  Diagnosis Date  . CAD (coronary artery disease)     CABG  . Hyperlipidemia   . Hypertension   . GERD (gastroesophageal reflux disease)   . Chronic pain syndrome     ROS: Negative except as per HPI  BP 130/86  Pulse 64  Ht 5\' 8"  (1.727 m)  Wt 203 lb (92.08 kg)  BMI 30.87 kg/m2  PHYSICAL EXAM: Pt is alert and oriented, pleasant gentleman inNAD HEENT: normal Neck: JVP - normal, carotids 2+= without bruits Lungs: CTA bilaterally CV: RRR without murmur or gallop Abd: soft, NT, Positive BS, no hepatomegaly Ext: no C/C/E, distal pulses intact and equal Skin: warm/dry no rash  EKG:  Normal sinus rhythm 64 beats per minute, right bundle branch block. No significant change from previous tracings.  ASSESSMENT AND PLAN: 1. Coronary artery disease, status post CABG. The patient's saphenous vein grafts are all occluded. He has a LIMA to LAD with a stent at the distal anastomotic site. With recurrent chest pain think we should check an exercise Myoview scan. His symptoms have both typical and atypical features. His medical program is appropriate and I would recommend continuing dual antiplatelet therapy at this time.  2. Hypertension. Blood pressure is controlled on a combination of hydrochlorothiazide and metoprolol.  3. Hyperlipidemia. Continue pravastatin. When he  returns for his stress test will check lipids and LFTs.  [name] 03/24/2013 4:42 PM

## 2013-03-24 NOTE — Patient Instructions (Addendum)
Your physician has requested that you have an exercise stress myoview. For further information please visit [email or web address] Please follow instruction sheet, as given.  Your physician recommends that you return for a FASTING LIPID, LIVER and BMP--nothing to eat or drink after midnight, lab opens at 7:30.   Your physician wants you to follow-up in: 6 MONTHS with Dr Excell Seltzer.  You will receive a reminder letter in the mail two months in advance. If you don't receive a letter, please call our office to schedule the follow-up appointment.

## 2013-03-31 ENCOUNTER — Ambulatory Visit: Payer: Self-pay | Admitting: Pain Medicine

## 2013-04-06 ENCOUNTER — Ambulatory Visit (INDEPENDENT_AMBULATORY_CARE_PROVIDER_SITE_OTHER): Payer: Medicare Other | Admitting: Cardiovascular Disease

## 2013-04-06 ENCOUNTER — Ambulatory Visit (HOSPITAL_COMMUNITY): Payer: Medicare Other | Attending: Cardiovascular Disease | Admitting: Radiology

## 2013-04-06 VITALS — BP 109/69 | HR 56 | Ht 68.0 in | Wt 200.0 lb

## 2013-04-06 DIAGNOSIS — E785 Hyperlipidemia, unspecified: Secondary | ICD-10-CM | POA: Insufficient documentation

## 2013-04-06 DIAGNOSIS — E7800 Pure hypercholesterolemia, unspecified: Secondary | ICD-10-CM

## 2013-04-06 DIAGNOSIS — I10 Essential (primary) hypertension: Secondary | ICD-10-CM | POA: Insufficient documentation

## 2013-04-06 DIAGNOSIS — R0989 Other specified symptoms and signs involving the circulatory and respiratory systems: Secondary | ICD-10-CM | POA: Insufficient documentation

## 2013-04-06 DIAGNOSIS — I25810 Atherosclerosis of coronary artery bypass graft(s) without angina pectoris: Secondary | ICD-10-CM

## 2013-04-06 DIAGNOSIS — R0602 Shortness of breath: Secondary | ICD-10-CM

## 2013-04-06 DIAGNOSIS — R42 Dizziness and giddiness: Secondary | ICD-10-CM | POA: Insufficient documentation

## 2013-04-06 DIAGNOSIS — Z9861 Coronary angioplasty status: Secondary | ICD-10-CM | POA: Insufficient documentation

## 2013-04-06 DIAGNOSIS — R55 Syncope and collapse: Secondary | ICD-10-CM | POA: Insufficient documentation

## 2013-04-06 DIAGNOSIS — R079 Chest pain, unspecified: Secondary | ICD-10-CM

## 2013-04-06 DIAGNOSIS — Z87891 Personal history of nicotine dependence: Secondary | ICD-10-CM | POA: Insufficient documentation

## 2013-04-06 DIAGNOSIS — E876 Hypokalemia: Secondary | ICD-10-CM

## 2013-04-06 DIAGNOSIS — I2510 Atherosclerotic heart disease of native coronary artery without angina pectoris: Secondary | ICD-10-CM

## 2013-04-06 DIAGNOSIS — R0609 Other forms of dyspnea: Secondary | ICD-10-CM | POA: Insufficient documentation

## 2013-04-06 DIAGNOSIS — R0789 Other chest pain: Secondary | ICD-10-CM | POA: Insufficient documentation

## 2013-04-06 DIAGNOSIS — R000 Tachycardia, unspecified: Secondary | ICD-10-CM | POA: Insufficient documentation

## 2013-04-06 DIAGNOSIS — Z951 Presence of aortocoronary bypass graft: Secondary | ICD-10-CM | POA: Insufficient documentation

## 2013-04-06 LAB — BASIC METABOLIC PANEL
BUN: 16 mg/dL (ref 6–23)
CO2: 29 mEq/L (ref 19–32)
Chloride: 104 mEq/L (ref 96–112)
GFR: 124.24 mL/min (ref >60.00)
Glucose, Bld: 114 mg/dL — ABNORMAL HIGH (ref 70–99)
Potassium: 3.2 mEq/L — ABNORMAL LOW (ref 3.5–5.1)
Sodium: 139 mEq/L (ref 135–145)

## 2013-04-06 LAB — HEPATIC FUNCTION PANEL
ALT: 49 U/L (ref 0–53)
Bilirubin, Direct: 0 mg/dL (ref 0.0–0.3)
Total Bilirubin: 0.7 mg/dL (ref 0.3–1.2)

## 2013-04-06 LAB — LIPID PANEL
HDL: 36.1 mg/dL — ABNORMAL LOW (ref >39.00)
Total CHOL/HDL Ratio: 4
VLDL: 23.2 mg/dL (ref 0.0–40.0)

## 2013-04-06 MED ORDER — TECHNETIUM TC 99M SESTAMIBI GENERIC - CARDIOLITE
30.0000 | Freq: Once | INTRAVENOUS | Status: AC | PRN
  Administered 2013-04-06: 30 via INTRAVENOUS

## 2013-04-06 MED ORDER — TECHNETIUM TC 99M SESTAMIBI GENERIC - CARDIOLITE
10.0000 | Freq: Once | INTRAVENOUS | Status: AC | PRN
  Administered 2013-04-06: 10 via INTRAVENOUS

## 2013-04-06 NOTE — Progress Notes (Signed)
[facility] SITE 3 NUCLEAR MED [address] [place], [place] [address] [phone]    Cardiology Nuclear Med Study  [name] [name] is a 62 y.o. male     MRN : [id number]     DOB: 21-Oct-1950  Procedure Date: 04/06/2013  Nuclear Med Background Indication for Stress Test:  Evaluation for Ischemia, PTCA/Stent and Graft Patency History:  '08 Stent-LAD; 7/08 Echo:EF=65%, mild MR, moderate TR; '08 CABG; '09 PTCA; '11 OZH:YQMVHQIO mild inferior ischemia, EF=63%>Cath:patent stent, all SVG;s occluded, medical tx Cardiac Risk Factors: History of Smoking, Hypertension and Lipids  Symptoms:  Chest Pressure.  (last episode of chest discomfort was Monday or Tuesday of this week), Dizziness, DOE/SOB, Near Syncope and Rapid HR    Nuclear Pre-Procedure Caffeine/Decaff Intake:  None NPO After: 11:00pm   Lungs:  Clear. O2 Sat: 96% on room air. IV 0.9% NS with Angio Cath:  22g  IV Site: R Hand  IV Started by:  Bonnita Levan, RN  Chest Size (in):  46 Cup Size: n/a  Height: 5\' 8"  (1.727 m)  Weight:  200 lb (90.719 kg)  BMI:  Body mass index is 30.42 kg/(m^2). Tech Comments:  Toprol held x 24 hours    Nuclear Med Study 1 or 2 day study: 1 day  Stress Test Type:  Stress  Reading MD: Marca Ancona, MD  Order Authorizing Provider:  [name], MD  Resting Radionuclide: Technetium 9m Sestamibi  Resting Radionuclide Dose: 11.0 mCi   Stress Radionuclide:  Technetium 92m Sestamibi  Stress Radionuclide Dose: 32.6 mCi           Stress Protocol Rest HR: 56 Stress HR: 135  Rest BP: 109/69 Stress BP: 198/99  Exercise Time (min): 10:01 METS: 10.1   Predicted Max HR: 159 bpm % Max HR: 84.91 bpm Rate Pressure Product: 96295   Dose of Adenosine (mg):  n/a Dose of Lexiscan: n/a mg  Dose of Atropine (mg): n/a Dose of Dobutamine: n/a mcg/kg/min (at max HR)  Stress Test Technologist: Smiley Houseman, CMA-N  Nuclear Technologist:  Domenic Polite, CNMT     Rest Procedure:  Myocardial perfusion  imaging was performed at rest 45 minutes following the intravenous administration of Technetium 9m Sestamibi.  Rest ECG: NSR-RBBB  Stress Procedure:  The patient exercised on the treadmill utilizing the Bruce Protocol for 10:01 minutes. The patient stopped due to fatigue and hip pain.  He c/o chest pressure with exercise.  Technetium 26m Sestamibi was injected at peak exercise and myocardial perfusion imaging was performed after a brief delay.  Stress ECG: Significant ST abnormalities consistent with ischemia.  QPS Raw Data Images:  Normal; no motion artifact; normal heart/lung ratio. Stress Images:  Medium-sized, severe basal to mid inferolateral perfusion defect.  Rest Images:  Medium-sized, severe basal to mid inferolateral perfusion defect. Subtraction (SDS):  Primarily fixed, medium-sized, severe basal to mid inferolateral perfusion defect. Transient Ischemic Dilatation (Normal <1.22):  n/a Lung/Heart Ratio (Normal <0.45):  0.49  Quantitative Gated Spect Images QGS EDV:  107 ml QGS ESV:  43 ml  Impression Exercise Capacity:  Good exercise capacity. BP Response:  Hypertensive blood pressure response. Clinical Symptoms:  Chest pressure. ECG Impression:  2 mm horizontal ST depression in V4-V5 during stress that took a significant amount of time to resolve in recovery.  Comparison with Prior Nuclear Study: New finding compared to prior study.   Overall Impression:  Intermediate risk stress nuclear study with a primarily fixed, medium-sized severe basal to mid inferolateral perfusion defect.  This is suggestive of primarily infarction with some peri-infarction ischemia.  Stress ECG was ischemic. Marland Kitchen  LV Ejection Fraction: 60%.  LV Wall Motion:  Inferolateral hypokinesis.   Marca Ancona 04/06/2013

## 2013-04-11 ENCOUNTER — Encounter: Payer: Self-pay | Admitting: Nurse Practitioner

## 2013-04-12 ENCOUNTER — Other Ambulatory Visit: Payer: Self-pay | Admitting: Cardiovascular Disease

## 2013-04-17 ENCOUNTER — Encounter: Payer: Self-pay | Admitting: Nurse Practitioner

## 2013-04-17 ENCOUNTER — Telehealth: Payer: Self-pay | Admitting: Cardiovascular Disease

## 2013-04-17 MED ORDER — POTASSIUM CHLORIDE CRYS ER 20 MEQ PO TBCR: 20.0000 meq | EXTENDED_RELEASE_TABLET | Freq: Every day | ORAL | Status: DC

## 2013-04-17 NOTE — Telephone Encounter (Signed)
Reviewed lab results and plan of care with patient.  Rx for KDur sent to patient's pharmacy.  Cardiac cath scheduled for 9/19 @ 1200; patient given all instructions; letter printed and left at front desk for patient to pick up when he comes for lab work on 9/17.

## 2013-04-17 NOTE — Progress Notes (Signed)
Patient called office after receiving letter.  I reviewed patient's lab results and plan of care with him and sent Rx for KDur 20 mEq to patient's pharmacy.  Patient verbalized understanding of need for cardiac cath.  Patient scheduled for cath on Friday 9/19 @ 1200 in main cath lab.  Patient informed to arrive at [facility] at 1000.  I reviewed Cath instructions with patient and scheduled lab work for Wed. 9/17 here at [facility]. Office.  Patient verbalized understanding of all instructions and lab appt date/time.

## 2013-04-17 NOTE — Telephone Encounter (Signed)
New problem   Pt was told by letter to call office to get results of test.

## 2013-04-18 ENCOUNTER — Encounter (HOSPITAL_COMMUNITY): Payer: Self-pay | Admitting: Pharmacy Technician

## 2013-04-19 ENCOUNTER — Other Ambulatory Visit (INDEPENDENT_AMBULATORY_CARE_PROVIDER_SITE_OTHER): Payer: Medicare Other

## 2013-04-19 ENCOUNTER — Other Ambulatory Visit: Payer: Self-pay | Admitting: Cardiovascular Disease

## 2013-04-19 DIAGNOSIS — I25810 Atherosclerosis of coronary artery bypass graft(s) without angina pectoris: Secondary | ICD-10-CM

## 2013-04-19 DIAGNOSIS — E7800 Pure hypercholesterolemia, unspecified: Secondary | ICD-10-CM

## 2013-04-19 DIAGNOSIS — E876 Hypokalemia: Secondary | ICD-10-CM

## 2013-04-19 LAB — CBC WITH DIFFERENTIAL/PLATELET
Basophils Absolute: 0 10*3/uL (ref 0.0–0.1)
Eosinophils Absolute: 0.1 10*3/uL (ref 0.0–0.7)
Lymphocytes Relative: 36.2 % (ref 12.0–46.0)
Monocytes Relative: 9.1 % (ref 3.0–12.0)
Platelets: 157 10*3/uL (ref 150.0–400.0)
RDW: 14 % (ref 11.5–14.6)

## 2013-04-19 LAB — BASIC METABOLIC PANEL
BUN: 15 mg/dL (ref 6–23)
Calcium: 9 mg/dL (ref 8.4–10.5)
Creatinine, Ser: 0.9 mg/dL (ref 0.4–1.5)
GFR: 110 mL/min (ref >60.00)
Glucose, Bld: 95 mg/dL (ref 70–99)

## 2013-04-19 LAB — PROTIME-INR: Prothrombin Time: 11.5 s (ref 10.2–12.4)

## 2013-04-19 NOTE — Telephone Encounter (Signed)
Patient called office after receiving letter.  I reviewed patient's lab results and plan of care with him and sent Rx for KDur 20 mEq to patient's pharmacy.  Patient verbalized understanding of need for cardiac cath.  Patient scheduled for cath on Friday 9/19 @ 1200 in main cath lab.  Patient informed to arrive at [facility] at 1000.  I reviewed Cath instructions with patient and scheduled lab work for Wed. 9/17 here at [facility] Office.  Patient verbalized understanding of all instructions and lab appt date/time.   Copied from lab encounter.

## 2013-04-21 ENCOUNTER — Ambulatory Visit (HOSPITAL_COMMUNITY)
Admission: RE | Admit: 2013-04-21 | Discharge: 2013-04-21 | Disposition: A | Discharge status: Home / Self Care | Payer: Medicare Other | Source: Ambulatory Visit | Attending: Cardiovascular Disease | Admitting: Cardiovascular Disease

## 2013-04-21 ENCOUNTER — Encounter (HOSPITAL_COMMUNITY)
Admission: RE | Disposition: A | Discharge status: Home / Self Care | Payer: Self-pay | Source: Ambulatory Visit | Attending: Cardiovascular Disease

## 2013-04-21 DIAGNOSIS — R079 Chest pain, unspecified: Secondary | ICD-10-CM | POA: Insufficient documentation

## 2013-04-21 DIAGNOSIS — I2510 Atherosclerotic heart disease of native coronary artery without angina pectoris: Secondary | ICD-10-CM

## 2013-04-21 DIAGNOSIS — Z7902 Long term (current) use of antithrombotics/antiplatelets: Secondary | ICD-10-CM | POA: Insufficient documentation

## 2013-04-21 DIAGNOSIS — I10 Essential (primary) hypertension: Secondary | ICD-10-CM | POA: Insufficient documentation

## 2013-04-21 DIAGNOSIS — Z79899 Other long term (current) drug therapy: Secondary | ICD-10-CM | POA: Insufficient documentation

## 2013-04-21 DIAGNOSIS — E785 Hyperlipidemia, unspecified: Secondary | ICD-10-CM | POA: Insufficient documentation

## 2013-04-21 DIAGNOSIS — Z951 Presence of aortocoronary bypass graft: Secondary | ICD-10-CM | POA: Insufficient documentation

## 2013-04-21 DIAGNOSIS — R0602 Shortness of breath: Secondary | ICD-10-CM | POA: Insufficient documentation

## 2013-04-21 HISTORY — PX: LEFT HEART CATHETERIZATION WITH CORONARY/GRAFT ANGIOGRAM: SHX5450

## 2013-04-21 SURGERY — LEFT HEART CATHETERIZATION WITH CORONARY/GRAFT ANGIOGRAM
Anesthesia: LOCAL

## 2013-04-21 MED ORDER — ASPIRIN 81 MG PO CHEW
CHEWABLE_TABLET | ORAL | Status: AC
  Filled 2013-04-21: qty 4

## 2013-04-21 MED ORDER — ASPIRIN 81 MG PO CHEW
324.0000 mg | CHEWABLE_TABLET | ORAL | Status: AC
  Administered 2013-04-21: 324 mg via ORAL

## 2013-04-21 MED ORDER — ONDANSETRON HCL 4 MG/2ML IJ SOLN: 4.0000 mg | Freq: Four times a day (QID) | INTRAMUSCULAR | Status: DC | PRN

## 2013-04-21 MED ORDER — NITROGLYCERIN 0.2 MG/ML ON CALL CATH LAB
INTRAVENOUS | Status: AC
  Filled 2013-04-21: qty 1

## 2013-04-21 MED ORDER — SODIUM CHLORIDE 0.9 % IV SOLN: 1.0000 mL/kg/h | INTRAVENOUS | Status: DC

## 2013-04-21 MED ORDER — SODIUM CHLORIDE 0.9 % IV SOLN
INTRAVENOUS | Status: DC
  Administered 2013-04-21: 12:00:00 via INTRAVENOUS

## 2013-04-21 MED ORDER — SODIUM CHLORIDE 0.9 % IV SOLN: 250.0000 mL | INTRAVENOUS | Status: DC | PRN

## 2013-04-21 MED ORDER — SODIUM CHLORIDE 0.9 % IJ SOLN: 3.0000 mL | Freq: Two times a day (BID) | INTRAMUSCULAR | Status: DC

## 2013-04-21 MED ORDER — MIDAZOLAM HCL 2 MG/2ML IJ SOLN
INTRAMUSCULAR | Status: AC
  Filled 2013-04-21: qty 2

## 2013-04-21 MED ORDER — HEPARIN (PORCINE) IN NACL 2-0.9 UNIT/ML-% IJ SOLN
INTRAMUSCULAR | Status: AC
  Filled 2013-04-21: qty 1000

## 2013-04-21 MED ORDER — LIDOCAINE HCL (PF) 1 % IJ SOLN
INTRAMUSCULAR | Status: AC
  Filled 2013-04-21: qty 30

## 2013-04-21 MED ORDER — FENTANYL CITRATE 0.05 MG/ML IJ SOLN
INTRAMUSCULAR | Status: AC
  Filled 2013-04-21: qty 2

## 2013-04-21 MED ORDER — ACETAMINOPHEN 325 MG PO TABS: 650.0000 mg | ORAL_TABLET | ORAL | Status: DC | PRN

## 2013-04-21 MED ORDER — SODIUM CHLORIDE 0.9 % IJ SOLN: 3.0000 mL | INTRAMUSCULAR | Status: DC | PRN

## 2013-04-21 MED ORDER — ISOSORBIDE MONONITRATE ER 30 MG PO TB24: 30.0000 mg | ORAL_TABLET | Freq: Every day | ORAL | Status: DC

## 2013-04-21 NOTE — CV Procedure (Signed)
   Cardiac Catheterization Procedure Note  Name: [name] [name] MRN: [id number] DOB: 31-Jan-1951  Procedure: Selective Coronary Angiography, LIMA angiography  Indication: Chest pain with typical and atypical features, CCS class III, moderate risk nuclear scan  Procedural details: The right groin was prepped, draped, and anesthetized with 1% lidocaine. Using modified Seldinger technique, a 5 French sheath was introduced into the right femoral artery. Standard Judkins catheters were used for coronary angiography and LIMA angiography. The JR 4 catheter was used to access the left subclavian artery and this was changed out for LIMA catheter over an exchange length J-wire. Catheter exchanges were performed over a guidewire. There were no immediate procedural complications. The patient was transferred to the post catheterization recovery area for further monitoring.  Procedural Findings: Hemodynamics:  AO 118/70   Coronary angiography: Coronary dominance: right  Left mainstem: The left main is patent. It arises from the left cusp it divides into the LAD and left circumflex the  Left anterior descending (LAD): The LAD is moderately calcified. The proximal LAD has 50-60% stenosis. The mid LAD is severely diseased and segmental fashion up to 95% stenosis. The distal LAD fills from the LIMA graft. The diagonal branches are patent. The first diagonal has severe diffuse disease of 80%  Left circumflex (LCx): The circumflex is patent in its proximal aspect. There is segmental 50% stenoses. The circumflex divides into 2 obtuse marginal branches, both are severely diseased. The first OM has diffuse 90% stenosis and it is a small-caliber vessel. The second OM has diffuse 90% stenosis and it is also of small caliber.  Right coronary artery (RCA): The RCA is patent. It is a dominant vessel. The proximal vessel has mild plaque. The mid vessel has 50% stenosis. The distal vessel is patent. The branch vessels of  the RCA are very small in caliber with severe diffuse disease in each of them.  The LIMA to LAD is widely patent. The stented segment the coronary anastomotic site is patent. Beyond the mammary insertion site in the distal LAD there is segmental diffuse 70-80% stenosis wrapping around the left ventricular apex.  Left ventriculography: Deferred   Final Conclusions:   1. Severe native three-vessel coronary artery disease as outlined. There is a diffuse distal vessel disease pattern without good targets for revascularization.  2. Continued patency of the LIMA to LAD with moderately severe diffuse disease in the distal LAD beyond the LIMA insertion site  Recommendations: I think ongoing medical therapy as her only option for treatment. Because of his diffuse distal disease pattern he is not a candidate for further revascularization.  [name] 04/21/2013, 3:18 PM

## 2013-04-21 NOTE — Interval H&P Note (Signed)
History and Physical Interval Note:  04/21/2013 2:44 PM  [name] [name]  has presented today for surgery, with the diagnosis of Chest pain  The various methods of treatment have been discussed with the patient and family. After consideration of risks, benefits and other options for treatment, the patient has consented to  Procedure(s): LEFT HEART CATHETERIZATION WITH CORONARY/GRAFT ANGIOGRAM (N/A) as a surgical intervention .  The patient's history has been reviewed, patient examined, no change in status, stable for surgery.  I have reviewed the patient's chart and labs.  Questions were answered to the patient's satisfaction.    Cath Lab Visit (complete for each Cath Lab visit)  Clinical Evaluation Leading to the Procedure:   ACS: no  Non-ACS:    Anginal Classification: CCS III  Anti-ischemic medical therapy: Minimal Therapy (1 class of medications)  Non-Invasive Test Results: Intermediate-risk stress test findings: cardiac mortality 1-3%/year  Prior CABG: Previous CABG         [name]

## 2013-04-21 NOTE — Progress Notes (Signed)
UP AND WALKED AND TOL WELL AND RIGHT GROIN STABLE; NO BLEEDING OR HEMATOMA 

## 2013-04-21 NOTE — H&P (View-Only) (Signed)
 HPI:  62-year-old gentleman presenting for followup evaluation. The patient is followed for coronary artery disease with history of coronary bypass surgery in 2008 and PCI of the LIMA to LAD anastomotic site a few years later. His last cardiac catheterization in 2011 demonstrating continued stent patency.  The patient is having recurrent chest pain again. He describes a dull ache in the center of the chest. This is not typically related to exertion. He has mild shortness of breath but no significant change. He is gaining some weight. He does not participate in any exercise. He denies edema, orthopnea, PND, or palpitations. There were no associated symptoms with his chest pain. Sometimes it feels like "indigestion."   Outpatient Encounter Prescriptions as of 03/24/2013  Medication Sig Dispense Refill  . aspirin 325 MG tablet Take 325 mg by mouth daily.        . clopidogrel (PLAVIX) 75 MG tablet Take 1 tablet (75 mg total) by mouth daily.  90 tablet  3  . gabapentin (NEURONTIN) 300 MG capsule TAKE ONE TABLET BY MOUTH DAILY AT NIGHT      . hydrochlorothiazide (HYDRODIURIL) 25 MG tablet TAKE ONE TABLET BY MOUTH DAILY.  30 tablet  4  . metoprolol succinate (TOPROL-XL) 50 MG 24 hr tablet Take 1 tablet (50 mg total) by mouth daily. Take with or immediately following a meal.  90 tablet  3  . nitroGLYCERIN (NITROSTAT) 0.4 MG SL tablet Place 1 tablet (0.4 mg total) under the tongue every 5 (five) minutes as needed.  25 tablet  6  . oxyCODONE-acetaminophen (PERCOCET) 7.5-325 MG per tablet Take 1 tablet by mouth every 4 (four) hours as needed.        . pantoprazole (PROTONIX) 40 MG tablet TAKE 1 TABLET BY MOUTH EVERY DAY  90 tablet  3  . pravastatin (PRAVACHOL) 40 MG tablet Take 1 tablet (40 mg total) by mouth daily.  90 tablet  3  . [DISCONTINUED] hydrochlorothiazide (HYDRODIURIL) 25 MG tablet Take 1 tablet (25 mg total) by mouth daily.  90 tablet  3   No facility-administered encounter medications on file  as of 03/24/2013.    No Known Allergies  Past Medical History  Diagnosis Date  . CAD (coronary artery disease)     CABG  . Hyperlipidemia   . Hypertension   . GERD (gastroesophageal reflux disease)   . Chronic pain syndrome     ROS: Negative except as per HPI  BP 130/86  Pulse 64  Ht 5' 8" (1.727 m)  Wt 203 lb (92.08 kg)  BMI 30.87 kg/m2  PHYSICAL EXAM: Pt is alert and oriented, pleasant gentleman inNAD HEENT: normal Neck: JVP - normal, carotids 2+= without bruits Lungs: CTA bilaterally CV: RRR without murmur or gallop Abd: soft, NT, Positive BS, no hepatomegaly Ext: no C/C/E, distal pulses intact and equal Skin: warm/dry no rash  EKG:  Normal sinus rhythm 64 beats per minute, right bundle branch block. No significant change from previous tracings.  ASSESSMENT AND PLAN: 1. Coronary artery disease, status post CABG. The patient's saphenous vein grafts are all occluded. He has a LIMA to LAD with a stent at the distal anastomotic site. With recurrent chest pain think we should check an exercise Myoview scan. His symptoms have both typical and atypical features. His medical program is appropriate and I would recommend continuing dual antiplatelet therapy at this time.  2. Hypertension. Blood pressure is controlled on a combination of hydrochlorothiazide and metoprolol.  3. Hyperlipidemia. Continue pravastatin. When he   returns for his stress test will check lipids and LFTs.  [name] [name] 03/24/2013 4:42 PM        

## 2013-04-27 ENCOUNTER — Ambulatory Visit: Payer: Self-pay | Admitting: Pain Medicine

## 2013-05-17 ENCOUNTER — Ambulatory Visit: Payer: Self-pay | Admitting: Pain Medicine

## 2013-06-20 ENCOUNTER — Other Ambulatory Visit: Payer: Self-pay

## 2013-06-20 MED ORDER — NITROGLYCERIN 0.4 MG SL SUBL: 0.4000 mg | SUBLINGUAL_TABLET | SUBLINGUAL | Status: DC | PRN

## 2013-07-10 ENCOUNTER — Ambulatory Visit: Payer: Self-pay | Admitting: Pain Medicine

## 2013-07-12 ENCOUNTER — Other Ambulatory Visit: Payer: Self-pay | Admitting: Cardiovascular Disease

## 2013-07-15 ENCOUNTER — Other Ambulatory Visit: Payer: Self-pay | Admitting: Cardiovascular Disease

## 2013-07-25 ENCOUNTER — Other Ambulatory Visit: Payer: Self-pay | Admitting: Cardiovascular Disease

## 2013-10-10 ENCOUNTER — Other Ambulatory Visit: Payer: Self-pay | Admitting: Cardiovascular Disease

## 2013-11-04 ENCOUNTER — Other Ambulatory Visit: Payer: Self-pay | Admitting: Cardiovascular Disease

## 2013-11-17 ENCOUNTER — Ambulatory Visit: Payer: Self-pay | Admitting: Pain Medicine

## 2013-11-22 ENCOUNTER — Encounter (INDEPENDENT_AMBULATORY_CARE_PROVIDER_SITE_OTHER): Payer: Self-pay

## 2013-11-22 ENCOUNTER — Ambulatory Visit (INDEPENDENT_AMBULATORY_CARE_PROVIDER_SITE_OTHER): Payer: Medicare Other | Admitting: Cardiovascular Disease

## 2013-11-22 ENCOUNTER — Encounter: Payer: Self-pay | Admitting: Cardiovascular Disease

## 2013-11-22 VITALS — BP 118/84 | HR 60 | Ht 68.0 in | Wt 217.0 lb

## 2013-11-22 DIAGNOSIS — I10 Essential (primary) hypertension: Secondary | ICD-10-CM

## 2013-11-22 DIAGNOSIS — I25810 Atherosclerosis of coronary artery bypass graft(s) without angina pectoris: Secondary | ICD-10-CM

## 2013-11-22 DIAGNOSIS — R0602 Shortness of breath: Secondary | ICD-10-CM

## 2013-11-22 DIAGNOSIS — E7800 Pure hypercholesterolemia, unspecified: Secondary | ICD-10-CM

## 2013-11-22 NOTE — Patient Instructions (Signed)
Your physician has requested that you have an echocardiogram. Echocardiography is a painless test that uses sound waves to create images of your heart. It provides your doctor with information about the size and shape of your heart and how well your heart's chambers and valves are working. This procedure takes approximately one hour. There are no restrictions for this procedure.  Your physician recommends that you return for a FASTING LIPID, LIVER, BMP and BNP--nothing to eat or drink after midnight (this can be done the same day as Echo)  Your physician wants you to follow-up in: 6 MONTHS with Dr Excell Seltzer.  You will receive a reminder letter in the mail two months in advance. If you don't receive a letter, please call our office to schedule the follow-up appointment.  Your physician recommends that you continue on your current medications as directed. Please refer to the Current Medication list given to you today.

## 2013-11-22 NOTE — Progress Notes (Signed)
HPI:  63 year old gentleman presenting for followup evaluation. The patient is followed for coronary artery disease with history of coronary bypass surgery in 2008 and PCI of the LIMA to LAD anastomotic site a few years later. The patient was seen last fall with recurrence of chest pain. A Myoview scan was intermediate risk. He was referred back for cardiac catheterization demonstrating continued patency at the stent site involving the LIMA to LAD anastomosis. He was noted to have diffuse small vessel coronary artery disease and medical therapy was recommended.  The patient reports an episode of lightheadedness and associated palpitations. This occurred after he had not eaten for several hours. It was an isolated episode and he has not had palpitations otherwise. He denies chest pain. He does complain of shortness of breath with exertion, but he leads a sedentary lifestyle. He has mild leg swelling. No orthopnea, PND, or other complaints.   Outpatient Encounter Prescriptions as of 11/22/2013  Medication Sig  . aspirin EC 325 MG tablet Take 325 mg by mouth daily.  . clopidogrel (PLAVIX) 75 MG tablet TAKE 1 TABLET BY MOUTH EVERY DAY.  . fluticasone (VERAMYST) 27.5 MCG/SPRAY nasal spray As directed  . gabapentin (NEURONTIN) 300 MG capsule Take 300 mg by mouth at bedtime.  . hydrochlorothiazide (HYDRODIURIL) 25 MG tablet TAKE 1 TABLET BY MOUTH EVERY DAY  . isosorbide mononitrate (IMDUR) 30 MG 24 hr tablet Take 1 tablet (30 mg total) by mouth daily.  . magnesium oxide (MAG-OX) 400 MG tablet Take 400 mg by mouth daily.  . metoprolol succinate (TOPROL-XL) 50 MG 24 hr tablet TAKE 1 TABLET BY MOUTH EVERY DAY. TAKE WITH OR IMMEDIATELY FOLLOWING A MEAL  . nitroGLYCERIN (NITROSTAT) 0.4 MG SL tablet Place 1 tablet (0.4 mg total) under the tongue every 5 (five) minutes as needed for chest pain.  Marland Kitchen oxyCODONE-acetaminophen (PERCOCET) 7.5-325 MG per tablet Take 1 tablet by mouth every 4 (four) hours as needed  for pain.   . pantoprazole (PROTONIX) 40 MG tablet TAKE 1 TABLET BY MOUTH EVERY DAY  . potassium chloride SA (K-DUR,KLOR-CON) 20 MEQ tablet Take 20 mEq by mouth daily.  . pravastatin (PRAVACHOL) 40 MG tablet Take 40 mg by mouth daily.  . [DISCONTINUED] metoprolol succinate (TOPROL-XL) 50 MG 24 hr tablet Take 50 mg by mouth daily. Take with or immediately following a meal.    No Known Allergies  Past Medical History  Diagnosis Date  . CAD (coronary artery disease)     CABG  . Hyperlipidemia   . Hypertension   . GERD (gastroesophageal reflux disease)   . Chronic pain syndrome     ROS: Negative except as per HPI  BP 118/84  Pulse 60  Ht 5\' 8"  (1.727 m)  Wt 217 lb (98.431 kg)  BMI 33.00 kg/m2  PHYSICAL EXAM: Pt is alert and oriented, pleasant overweight male in NAD HEENT: normal Neck: JVP - normal, carotids 2+= without bruits Lungs: CTA bilaterally CV: RRR without murmur or gallop Abd: soft, NT, Positive BS Ext: no C/C/E, distal pulses intact and equal Skin: warm/dry no rash  EKG:  Normal sinus rhythm 66 beats per minute, right bundle branch block.  Cardiac Cath 04/21/2014: Procedural Findings:   Hemodynamics:  AO 118/70  Coronary angiography:  Coronary dominance: right  Left mainstem: The left main is patent. It arises from the left cusp it divides into the LAD and left circumflex the  Left anterior descending (LAD): The LAD is moderately calcified. The proximal LAD has 50-60% stenosis.  The mid LAD is severely diseased and segmental fashion up to 95% stenosis. The distal LAD fills from the LIMA graft. The diagonal branches are patent. The first diagonal has severe diffuse disease of 80%  Left circumflex (LCx): The circumflex is patent in its proximal aspect. There is segmental 50% stenoses. The circumflex divides into 2 obtuse marginal branches, both are severely diseased. The first OM has diffuse 90% stenosis and it is a small-caliber vessel. The second OM has diffuse 90%  stenosis and it is also of small caliber.  Right coronary artery (RCA): The RCA is patent. It is a dominant vessel. The proximal vessel has mild plaque. The mid vessel has 50% stenosis. The distal vessel is patent. The branch vessels of the RCA are very small in caliber with severe diffuse disease in each of them.  The LIMA to LAD is widely patent. The stented segment the coronary anastomotic site is patent. Beyond the mammary insertion site in the distal LAD there is segmental diffuse 70-80% stenosis wrapping around the left ventricular apex.  Left ventriculography: Deferred  Final Conclusions:  1. Severe native three-vessel coronary artery disease as outlined. There is a diffuse distal vessel disease pattern without good targets for revascularization.  2. Continued patency of the LIMA to LAD with moderately severe diffuse disease in the distal LAD beyond the LIMA insertion site  Recommendations: I think ongoing medical therapy as her only option for treatment. Because of his diffuse distal disease pattern he is not a candidate for further revascularization.  ASSESSMENT AND PLAN: 1. Coronary artery disease, native vessel. He appears to be stable without symptoms of angina. I am going to continue his current medical program without changes. He is tolerating aspirin, Plavix, a statin drug, and the beta blocker.  2. Shortness of breath. The patient certainly is at risk for diastolic heart failure with his obesity, hypertension, and coronary artery disease. I have ordered an echocardiogram for further evaluation. His blood pressure appears well-controlled. I do not detect any evidence of volume overload on his physical exam.  3. Hypertension. Blood pressure is controlled on hydrochlorothiazide, isosorbide, and metoprolol.  4. Hyperlipidemia. Most recent lipids reviewed. Will repeat a lipid panel when he comes in for his echocardiogram. He continues on pravastatin.  For followup I will see him back in  6 months.  [name] BollmanMichael Hayzen [name] 11/22/2013 1:40 PM

## 2013-12-07 ENCOUNTER — Ambulatory Visit (HOSPITAL_COMMUNITY): Payer: Medicare Other | Attending: Internal Medicine | Admitting: Radiology

## 2013-12-07 ENCOUNTER — Other Ambulatory Visit (INDEPENDENT_AMBULATORY_CARE_PROVIDER_SITE_OTHER): Payer: Medicare Other

## 2013-12-07 DIAGNOSIS — R0602 Shortness of breath: Secondary | ICD-10-CM

## 2013-12-07 DIAGNOSIS — I10 Essential (primary) hypertension: Secondary | ICD-10-CM

## 2013-12-07 DIAGNOSIS — I079 Rheumatic tricuspid valve disease, unspecified: Secondary | ICD-10-CM | POA: Insufficient documentation

## 2013-12-07 DIAGNOSIS — E7800 Pure hypercholesterolemia, unspecified: Secondary | ICD-10-CM

## 2013-12-07 DIAGNOSIS — I25810 Atherosclerosis of coronary artery bypass graft(s) without angina pectoris: Secondary | ICD-10-CM

## 2013-12-07 LAB — HEPATIC FUNCTION PANEL
ALBUMIN: 3.6 g/dL (ref 3.5–5.2)
ALK PHOS: 72 U/L (ref 39–117)
ALT: 34 U/L (ref 0–53)
AST: 37 U/L (ref 0–37)
BILIRUBIN DIRECT: 0 mg/dL (ref 0.0–0.3)
Total Bilirubin: 0.9 mg/dL (ref 0.2–1.2)
Total Protein: 7.3 g/dL (ref 6.0–8.3)

## 2013-12-07 LAB — LIPID PANEL
CHOL/HDL RATIO: 4
Cholesterol: 167 mg/dL (ref 0–200)
HDL: 46.3 mg/dL (ref >39.00)
LDL Cholesterol: 103 mg/dL — ABNORMAL HIGH (ref 0–99)
Triglycerides: 90 mg/dL (ref 0.0–149.0)
VLDL: 18 mg/dL (ref 0.0–40.0)

## 2013-12-07 LAB — BASIC METABOLIC PANEL
BUN: 20 mg/dL (ref 6–23)
CO2: 30 meq/L (ref 19–32)
Calcium: 8.9 mg/dL (ref 8.4–10.5)
Chloride: 103 mEq/L (ref 96–112)
Creatinine, Ser: 0.9 mg/dL (ref 0.4–1.5)
GFR: 108.38 mL/min (ref >60.00)
GLUCOSE: 96 mg/dL (ref 70–99)
POTASSIUM: 3.6 meq/L (ref 3.5–5.1)
SODIUM: 140 meq/L (ref 135–145)

## 2013-12-07 LAB — BRAIN NATRIURETIC PEPTIDE: PRO B NATRI PEPTIDE: 30 pg/mL (ref 0.0–100.0)

## 2013-12-07 NOTE — Progress Notes (Signed)
Echocardiogram performed.  

## 2013-12-14 ENCOUNTER — Other Ambulatory Visit: Payer: Self-pay

## 2013-12-14 DIAGNOSIS — I272 Pulmonary hypertension, unspecified: Secondary | ICD-10-CM

## 2013-12-28 DIAGNOSIS — Z9861 Coronary angioplasty status: Secondary | ICD-10-CM | POA: Insufficient documentation

## 2013-12-28 DIAGNOSIS — E860 Dehydration: Secondary | ICD-10-CM | POA: Insufficient documentation

## 2013-12-28 DIAGNOSIS — Z951 Presence of aortocoronary bypass graft: Secondary | ICD-10-CM | POA: Insufficient documentation

## 2013-12-28 DIAGNOSIS — I951 Orthostatic hypotension: Secondary | ICD-10-CM | POA: Insufficient documentation

## 2013-12-28 DIAGNOSIS — Z955 Presence of coronary angioplasty implant and graft: Secondary | ICD-10-CM | POA: Insufficient documentation

## 2013-12-28 DIAGNOSIS — E876 Hypokalemia: Secondary | ICD-10-CM | POA: Insufficient documentation

## 2013-12-28 DIAGNOSIS — I252 Old myocardial infarction: Secondary | ICD-10-CM | POA: Insufficient documentation

## 2013-12-28 DIAGNOSIS — N281 Cyst of kidney, acquired: Secondary | ICD-10-CM | POA: Insufficient documentation

## 2013-12-28 DIAGNOSIS — S20219A Contusion of unspecified front wall of thorax, initial encounter: Secondary | ICD-10-CM | POA: Insufficient documentation

## 2013-12-28 DIAGNOSIS — I519 Heart disease, unspecified: Secondary | ICD-10-CM | POA: Insufficient documentation

## 2013-12-28 DIAGNOSIS — S4990XA Unspecified injury of shoulder and upper arm, unspecified arm, initial encounter: Secondary | ICD-10-CM | POA: Insufficient documentation

## 2014-01-06 ENCOUNTER — Other Ambulatory Visit: Payer: Self-pay | Admitting: Cardiovascular Disease

## 2014-01-20 ENCOUNTER — Other Ambulatory Visit: Payer: Self-pay | Admitting: Cardiovascular Disease

## 2014-02-08 DIAGNOSIS — M5481 Occipital neuralgia: Secondary | ICD-10-CM | POA: Insufficient documentation

## 2014-02-09 ENCOUNTER — Ambulatory Visit: Payer: Self-pay | Admitting: Pain Medicine

## 2014-04-27 ENCOUNTER — Other Ambulatory Visit: Payer: Self-pay | Admitting: Cardiovascular Disease

## 2014-05-18 ENCOUNTER — Ambulatory Visit: Payer: Self-pay | Admitting: Pain Medicine

## 2014-05-24 ENCOUNTER — Encounter: Payer: Self-pay | Admitting: Cardiovascular Disease

## 2014-05-24 ENCOUNTER — Ambulatory Visit (INDEPENDENT_AMBULATORY_CARE_PROVIDER_SITE_OTHER): Payer: Medicare Other | Admitting: Cardiovascular Disease

## 2014-05-24 ENCOUNTER — Other Ambulatory Visit: Payer: Medicare Other

## 2014-05-24 VITALS — BP 118/82 | HR 53 | Ht 68.0 in | Wt 209.8 lb

## 2014-05-24 DIAGNOSIS — I25810 Atherosclerosis of coronary artery bypass graft(s) without angina pectoris: Secondary | ICD-10-CM

## 2014-05-24 DIAGNOSIS — E7800 Pure hypercholesterolemia, unspecified: Secondary | ICD-10-CM

## 2014-05-24 DIAGNOSIS — I10 Essential (primary) hypertension: Secondary | ICD-10-CM

## 2014-05-24 LAB — LIPID PANEL
CHOLESTEROL: 188 mg/dL (ref 0–200)
HDL: 38.7 mg/dL — ABNORMAL LOW (ref >39.00)
LDL Cholesterol: 124 mg/dL — ABNORMAL HIGH (ref 0–99)
NONHDL: 149.3
Total CHOL/HDL Ratio: 5
Triglycerides: 129 mg/dL (ref 0.0–149.0)
VLDL: 25.8 mg/dL (ref 0.0–40.0)

## 2014-05-24 LAB — HEPATIC FUNCTION PANEL
ALK PHOS: 77 U/L (ref 39–117)
ALT: 35 U/L (ref 0–53)
AST: 46 U/L — AB (ref 0–37)
Albumin: 3.4 g/dL — ABNORMAL LOW (ref 3.5–5.2)
BILIRUBIN DIRECT: 0 mg/dL (ref 0.0–0.3)
BILIRUBIN TOTAL: 1 mg/dL (ref 0.2–1.2)
Total Protein: 7.9 g/dL (ref 6.0–8.3)

## 2014-05-24 NOTE — Patient Instructions (Signed)
Your physician wants you to follow-up in: 6 MONTHS with Dr Excell Seltzer.  You will receive a reminder letter in the mail two months in advance. If you don't receive a letter, please call our office to schedule the follow-up appointment.  Your physician recommends that you continue on your current medications as directed. Please refer to the Current Medication list given to you today.  Your physician recommends that you have lab work today: LIPID and LIVER

## 2014-05-24 NOTE — Progress Notes (Signed)
HPI:   63 year old gentleman presenting for followup evaluation. The patient is followed for coronary artery disease with history of coronary bypass surgery in 2008 and PCI of the LIMA to LAD anastomotic site a few years later. The patient was seen last fall with recurrence of chest pain. A Myoview scan was intermediate risk. He was referred back for cardiac catheterization demonstrating continued patency at the stent site involving the LIMA to LAD anastomosis. He was noted to have diffuse small vessel coronary artery disease and medical therapy was recommended.   The patient is doing well without symptoms of chest pain or shortness of breath. He is walking 2 miles per day. He denies leg swelling, heart palpitations, lightheadedness, or pre-syncope.  Studies: 2D Echo 12/07/2013: Study Conclusions  - Left ventricle: The cavity size was normal. There was mild concentric hypertrophy. Systolic function was normal. The estimated ejection fraction was in the range of 60% to 65%. Wall motion was normal; there were no regional wall motion abnormalities. There is diastolic dysfunction with indeterminate filling pressure. - Aortic valve: Trileaflet. Sclerosis without stenosis. Trivial regurgitation. - Left atrium: The atrium was at the upper limits of normal in size. - Right ventricle: The cavity size was normal. Wall thickness was normal. Systolic function was normal. RV systolic pressure: 11mm Hg (S, est). - Right atrium: The atrium was mildly dilated. Central venous pressure: 54mm Hg (est). - Tricuspid valve: Moderate regurgitation. - Pulmonary arteries: PA peak pressure: 58mm Hg (S). - Inferior vena cava: The vessel was dilated; the respirophasic diameter changes were blunted (< 50%); findings are consistent with elevated central venous pressure.  Cardiac Cath 04/21/2014: Procedural Findings:  Hemodynamics:  AO 118/70  Coronary angiography:  Coronary dominance: right  Left mainstem:  The left main is patent. It arises from the left cusp it divides into the LAD and left circumflex the  Left anterior descending (LAD): The LAD is moderately calcified. The proximal LAD has 50-60% stenosis. The mid LAD is severely diseased and segmental fashion up to 95% stenosis. The distal LAD fills from the LIMA graft. The diagonal branches are patent. The first diagonal has severe diffuse disease of 80%  Left circumflex (LCx): The circumflex is patent in its proximal aspect. There is segmental 50% stenoses. The circumflex divides into 2 obtuse marginal branches, both are severely diseased. The first OM has diffuse 90% stenosis and it is a small-caliber vessel. The second OM has diffuse 90% stenosis and it is also of small caliber.  Right coronary artery (RCA): The RCA is patent. It is a dominant vessel. The proximal vessel has mild plaque. The mid vessel has 50% stenosis. The distal vessel is patent. The branch vessels of the RCA are very small in caliber with severe diffuse disease in each of them.  The LIMA to LAD is widely patent. The stented segment the coronary anastomotic site is patent. Beyond the mammary insertion site in the distal LAD there is segmental diffuse 70-80% stenosis wrapping around the left ventricular apex.  Left ventriculography: Deferred  Final Conclusions:  1. Severe native three-vessel coronary artery disease as outlined. There is a diffuse distal vessel disease pattern without good targets for revascularization.  2. Continued patency of the LIMA to LAD with moderately severe diffuse disease in the distal LAD beyond the LIMA insertion site  Recommendations: I think ongoing medical therapy as her only option for treatment. Because of his diffuse distal disease pattern he is not a candidate for further revascularization.  [name]  04/21/2013, 3:18 PM  Labs 12/07/2013: Lipid Panel     Component Value Date/Time   CHOL 167 12/07/2013 1007   TRIG 90.0 12/07/2013 1007   HDL  46.30 12/07/2013 1007   CHOLHDL 4 12/07/2013 1007   VLDL 18.0 12/07/2013 1007   LDLCALC 103* 12/07/2013 1007   LDLDIRECT 92.7 07/04/2009 0946     Outpatient Encounter Prescriptions as of 05/24/2014  Medication Sig  . aspirin EC 325 MG tablet Take 325 mg by mouth daily.  . clopidogrel (PLAVIX) 75 MG tablet TAKE 1 TABLET BY MOUTH EVERY DAY  . fluticasone (VERAMYST) 27.5 MCG/SPRAY nasal spray As directed  . gabapentin (NEURONTIN) 300 MG capsule Take 300 mg by mouth at bedtime.  . hydrochlorothiazide (HYDRODIURIL) 25 MG tablet TAKE 1 TABLET BY MOUTH EVERY DAY  . isosorbide mononitrate (IMDUR) 30 MG 24 hr tablet Take 1 tablet (30 mg total) by mouth daily.  . magnesium oxide (MAG-OX) 400 MG tablet Take 400 mg by mouth daily.  . metoprolol succinate (TOPROL-XL) 50 MG 24 hr tablet TAKE 1 TABLET BY MOUTH EVERY DAY. TAKE WITH OR IMMEDIATELY FOLLOWING MEAL  . nitroGLYCERIN (NITROSTAT) 0.4 MG SL tablet Place 1 tablet (0.4 mg total) under the tongue every 5 (five) minutes as needed for chest pain.  Marland Kitchen. oxyCODONE-acetaminophen (PERCOCET) 7.5-325 MG per tablet Take 1 tablet by mouth every 4 (four) hours as needed for pain.   . pantoprazole (PROTONIX) 40 MG tablet TAKE 1 TABLET BY MOUTH EVERY DAY  . potassium chloride SA (K-DUR,KLOR-CON) 20 MEQ tablet Take 20 mEq by mouth daily.  . pravastatin (PRAVACHOL) 40 MG tablet Take 40 mg by mouth daily.    No Known Allergies  Past Medical History  Diagnosis Date  . CAD (coronary artery disease)     CABG  . Hyperlipidemia   . Hypertension   . GERD (gastroesophageal reflux disease)   . Chronic pain syndrome     ROS: Negative except as per HPI  BP 118/82  Pulse 53  Ht 5\' 8"  (1.727 m)  Wt 209 lb 12.8 oz (95.165 kg)  BMI 31.91 kg/m2  PHYSICAL EXAM: Pt is alert and oriented, NAD HEENT: normal Neck: JVP - normal, carotids 2+= without bruits Lungs: CTA bilaterally CV: RRR without murmur or gallop Abd: soft, NT, Positive BS, no hepatomegaly Ext: no C/C/E,  distal pulses intact and equal Skin: warm/dry no rash  EKG:  Sinus bradycardia 53 beats per minute, right bundle branch block, no change from previous tracings.  ASSESSMENT AND PLAN: 1. Coronary artery disease status post CABG, no anginal symptoms. The patient is stable on his current medical program. I reviewed the findings from his most recent cardiac catheterization. He will continue his current medical program without changes. I am inclined to keep him on long-term dual antiplatelet therapy with previous stenting of his LIMA to LAD anastomosis has his last remaining bypass conduits.  2. Hypertension. Blood pressure is well controlled on hydrochlorothiazide, isosorbide, and metoprolol succinate.  3. Hyperlipidemia. The patient takes pravastatin 40 mg. LDL was above goal, but he has lost some weight with diet and exercise. Repeat labs today. If he remains above goal, will switch to a more statin drug. Lengthy discussion about his exercise program today.  For followup I will see him back in 6 months.  Tonny BollmanMichael Arkel Cartwright 05/24/2014 10:11 AM

## 2014-07-12 ENCOUNTER — Encounter (HOSPITAL_COMMUNITY): Payer: Self-pay | Admitting: Cardiovascular Disease

## 2014-07-25 ENCOUNTER — Other Ambulatory Visit: Payer: Self-pay | Admitting: Cardiovascular Disease

## 2014-10-02 ENCOUNTER — Telehealth: Payer: Self-pay | Admitting: Cardiovascular Disease

## 2014-10-02 NOTE — Telephone Encounter (Signed)
Surgical clearance form faxed to [facility], rmf

## 2014-10-27 ENCOUNTER — Other Ambulatory Visit: Payer: Self-pay | Admitting: Cardiovascular Disease

## 2014-11-01 ENCOUNTER — Other Ambulatory Visit: Payer: Self-pay | Admitting: Cardiovascular Disease

## 2014-11-26 ENCOUNTER — Other Ambulatory Visit: Payer: Self-pay | Admitting: Physician Assistant

## 2014-12-20 ENCOUNTER — Telehealth: Payer: Self-pay | Admitting: Cardiovascular Disease

## 2014-12-20 NOTE — Telephone Encounter (Signed)
Ok to take vitamin D

## 2014-12-20 NOTE — Telephone Encounter (Signed)
New Message    Patient is calling bc his back specialist prescribed him so Vit D and he want to know if it is ok to take with his other medications. Please give patient a call.

## 2014-12-20 NOTE — Telephone Encounter (Signed)
Pt is aware he is okay to take Vitamin D.

## 2014-12-20 NOTE — Telephone Encounter (Signed)
I will forward this message to Audrie Lia Pharm-D to review the pt's medication list and determine is Vitamin D is okay to take with his medications.

## 2015-01-10 ENCOUNTER — Encounter: Payer: Self-pay | Admitting: Cardiovascular Disease

## 2015-01-10 ENCOUNTER — Ambulatory Visit (INDEPENDENT_AMBULATORY_CARE_PROVIDER_SITE_OTHER): Payer: Medicare Other | Admitting: Cardiovascular Disease

## 2015-01-10 VITALS — BP 130/78 | HR 59 | Ht 68.0 in | Wt 212.8 lb

## 2015-01-10 DIAGNOSIS — I25810 Atherosclerosis of coronary artery bypass graft(s) without angina pectoris: Secondary | ICD-10-CM | POA: Diagnosis not present

## 2015-01-10 DIAGNOSIS — I10 Essential (primary) hypertension: Secondary | ICD-10-CM | POA: Diagnosis not present

## 2015-01-10 DIAGNOSIS — E785 Hyperlipidemia, unspecified: Secondary | ICD-10-CM | POA: Diagnosis not present

## 2015-01-10 MED ORDER — ATORVASTATIN CALCIUM 20 MG PO TABS
20.0000 mg | ORAL_TABLET | Freq: Every day | ORAL | Status: DC
Start: 2015-01-10 — End: 2016-08-31

## 2015-01-10 NOTE — Progress Notes (Signed)
Cardiology Office Note   Date:  01/11/2015   ID:  [name] [name], DOB January 23, 1951, MRN [id number]  PCP:  Feliciana Rossetti, MD  Cardiologist:  [name], MD    Chief Complaint  Patient presents with  . Coronary Artery Disease     History of Present Illness: [name] [name] is a 64 y.o. male who presents for CAD. 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 At follow-up he has had continued patency of his mammary artery and occlusion of all vein grafts. He had a severe anastomotic lesion at the LIMA to LAD insertion site that was treated with stenting several years ago. Follow-up cath is demonstrated continued stent patency at the LIMA to LAD insertion.  He's walking 2 miles most days. It takes him an hour to do this and he has to go slowly because of back problems. He is working on a membership to [employer] in [place] and is going to start doing some exercises. He continues to struggle with weight loss.   Past Medical History  Diagnosis Date  . CAD (coronary artery disease)     CABG  . Hyperlipidemia   . Hypertension   . GERD (gastroesophageal reflux disease)   . Chronic pain syndrome     Past Surgical History  Procedure Laterality Date  . Pci 2009 with stenting of lima-lad anastamosis    . Coronary artery bypass graft      multivessel  . Hernia repair    . Cholecystectomy    . Left heart catheterization with coronary/graft angiogram N/A 04/21/2013    Procedure: LEFT HEART CATHETERIZATION WITH [name];  Surgeon: Micheline Chapman, MD;  Location: [facility] CATH LAB;  Service: Cardiovascular;  Laterality: N/A;    Current Outpatient Prescriptions  Medication Sig Dispense Refill  . aspirin EC 325 MG tablet Take 325 mg by mouth daily.    . clopidogrel (PLAVIX) 75 MG tablet TAKE 1 TABLET BY MOUTH EVERY DAY 90 tablet 1  . ergocalciferol (VITAMIN D2) 50000 UNITS capsule Take 50,000 Units by mouth. TWICE A WEEK    . fluticasone (VERAMYST) 27.5  MCG/SPRAY nasal spray As directed    . gabapentin (NEURONTIN) 300 MG capsule Take 300 mg by mouth at bedtime.    . hydrochlorothiazide (HYDRODIURIL) 25 MG tablet TAKE 1 TABLET BY MOUTH EVERY DAY 90 tablet 0  . isosorbide mononitrate (IMDUR) 30 MG 24 hr tablet TAKE 1 TABLET BY MOUTH EVERY DAY. 30 tablet 1  . magnesium oxide (MAG-OX) 400 MG tablet Take 400 mg by mouth daily.    . metoprolol succinate (TOPROL-XL) 50 MG 24 hr tablet TAKE 1 TABLET BY MOUTH EVERY DAY WITH OR IMMEDIATELY FOLLOWING A MEAL 90 tablet 1  . nitroGLYCERIN (NITROSTAT) 0.4 MG SL tablet Place 1 tablet (0.4 mg total) under the tongue every 5 (five) minutes as needed for chest pain. 25 tablet 3  . oxyCODONE-acetaminophen (PERCOCET) 7.5-325 MG per tablet Take 1 tablet by mouth every 4 (four) hours as needed for pain.     . pantoprazole (PROTONIX) 40 MG tablet TAKE 1 TABLET BY MOUTH EVERY DAY 90 tablet 1  . potassium chloride SA (K-DUR,KLOR-CON) 20 MEQ tablet Take 20 mEq by mouth daily.    Marland Kitchen atorvastatin (LIPITOR) 20 MG tablet Take 1 tablet (20 mg total) by mouth daily. 90 tablet 3   No current facility-administered medications for this visit.   Allergies:   Review of patient's allergies indicates no known allergies.   Social  History:  The patient  reports that he has quit smoking. He has never used smokeless tobacco. He reports that he does not drink alcohol or use illicit drugs.   Family History:  The patient's family history includes Multiple sclerosis in his mother; Stroke in his father.   ROS:  Please see the history of present illness.  Otherwise, review of systems is positive for leg pain, back pain, muscle aches.  All other systems are reviewed and negative.   PHYSICAL EXAM: VS:  BP 130/78 mmHg  Pulse 59  Ht  (1.727 m)  Wt 212 lb 12.8 oz (96.525 kg)  BMI 32.36 kg/m2 , BMI Body mass index is 32.36 kg/(m^2). GEN: Well nourished, well developed, in no acute distress HEENT: normal Neck: no JVD, no masses. No  carotid bruits Cardiac: RRR without murmur or gallop                Respiratory:  clear to auscultation bilaterally, normal work of breathing GI: soft, nontender, nondistended, + BS MS: no deformity or atrophy Ext: no pretibial edema, pedal pulses 2+= bilaterally Skin: warm and dry, no rash Neuro:  Strength and sensation are intact Psych: euthymic mood, full affect  EKG:  EKG is ordered today. The ekg ordered today shows sinus rhythm 59 bpm, RBBB, no change from previous  Recent Labs: 05/24/2014: ALT 35   Lipid Panel     Component Value Date/Time   CHOL 188 05/24/2014 1031   TRIG 129.0 05/24/2014 1031   HDL 38.70* 05/24/2014 1031   CHOLHDL 5 05/24/2014 1031   VLDL 25.8 05/24/2014 1031   LDLCALC 124* 05/24/2014 1031   LDLDIRECT 92.7 07/04/2009 0946    Wt Readings from Last 3 Encounters:  01/10/15 212 lb 12.8 oz (96.525 kg)  05/24/14 209 lb 12.8 oz (95.165 kg)  11/22/13 217 lb (98.431 kg)    Cardiac Studies Reviewed: 2D Echo 12/07/2013: Study Conclusions - Left ventricle: The cavity size was normal. There was mild concentric hypertrophy. Systolic function was normal. The estimated ejection fraction was in the range of 60% to 65%. Wall motion was normal; there were no regional wall motion abnormalities. There is diastolic dysfunction with indeterminate filling pressure. - Aortic valve: Trileaflet. Sclerosis without stenosis. Trivial regurgitation. - Left atrium: The atrium was at the upper limits of normal in size. - Right ventricle: The cavity size was normal. Wall thickness was normal. Systolic function was normal. RV systolic pressure: 46mm Hg (S, est). - Right atrium: The atrium was mildly dilated. Central venous pressure: 3mm Hg (est). - Tricuspid valve: Moderate regurgitation. - Pulmonary arteries: PA peak pressure: 46mm Hg (S). - Inferior vena cava: The vessel was dilated; the respirophasic diameter changes were blunted (< 50%); findings are consistent with  elevated central venous pressure.  Cardiac Cath 04/21/2014: Procedural Findings:  Hemodynamics:  AO 118/70  Coronary angiography:  Coronary dominance: right  Left mainstem: The left main is patent. It arises from the left cusp it divides into the LAD and left circumflex the  Left anterior descending (LAD): The LAD is moderately calcified. The proximal LAD has 50-60% stenosis. The mid LAD is severely diseased and segmental fashion up to 95% stenosis. The distal LAD fills from the LIMA graft. The diagonal branches are patent. The first diagonal has severe diffuse disease of 80%  Left circumflex (LCx): The circumflex is patent in its proximal aspect. There is segmental 50% stenoses. The circumflex divides into 2 obtuse marginal branches, both are severely diseased. The first OM  has diffuse 90% stenosis and it is a small-caliber vessel. The second OM has diffuse 90% stenosis and it is also of small caliber.  Right coronary artery (RCA): The RCA is patent. It is a dominant vessel. The proximal vessel has mild plaque. The mid vessel has 50% stenosis. The distal vessel is patent. The branch vessels of the RCA are very small in caliber with severe diffuse disease in each of them.  The LIMA to LAD is widely patent. The stented segment the coronary anastomotic site is patent. Beyond the mammary insertion site in the distal LAD there is segmental diffuse 70-80% stenosis wrapping around the left ventricular apex.  Left ventriculography: Deferred  Final Conclusions:  1. Severe native three-vessel coronary artery disease as outlined. There is a diffuse distal vessel disease pattern without good targets for revascularization.  2. Continued patency of the LIMA to LAD with moderately severe diffuse disease in the distal LAD beyond the LIMA insertion site  Recommendations: I think ongoing medical therapy as her only option for treatment. Because of his diffuse distal disease pattern he is not a candidate  for further revascularization.  [name]  04/21/2013, 3:18 PM  ASSESSMENT AND PLAN: 1.  CAD s/p CABG: no angina. Current medications reviewed and will be continued without changes except for his statin drug. Will follow-up in 6 months.  2. HTN, essential: BP controlled on current Rx. Reviewed Low-sodium diet with patient.  3. Hyperlipidemia: last lipids reviewed and above goal. He has made some dietary changes but no significant change in his weight. I recommended changing from pravastatin to atorvastatin for more robust lipid-lowering. Will repeat a lipid panel in 3 months.  Current medicines are reviewed with the patient today.  The patient does not have concerns regarding medicines.  Labs/ tests ordered today include:   Orders Placed This Encounter  Procedures  . Lipid panel  . Hepatic function panel  . EKG 12-Lead   Disposition:   FU 6 months  Signed, [name], MD  01/11/2015 1:29 PM    [facility] [address] [place], [place], [place]  [address] Phone: [phone]; Fax: [phone]

## 2015-01-10 NOTE — Patient Instructions (Addendum)
Medication Instructions:  Your physician has recommended you make the following change in your medication:  1. STOP Pravastatin 2. START Atorvastatin 20mg  take one tablet by mouth daily at bedtime  Labwork: Your physician recommends that you return for a FASTING LIPID and LIVER in 2-3 MONTHS--nothing to eat or drink after midnight, lab opens at 7:30 AM  Testing/Procedures: No new orders.   Follow-Up: Your physician wants you to follow-up in: 6 MONTHS with Dr Excell Seltzer.  You will receive a reminder letter in the mail two months in advance. If you don't receive a letter, please call our office to schedule the follow-up appointment.   Any Other Special Instructions Will Be Listed Below (If Applicable).

## 2015-02-04 ENCOUNTER — Other Ambulatory Visit: Payer: Self-pay | Admitting: Cardiovascular Disease

## 2015-02-05 ENCOUNTER — Other Ambulatory Visit: Payer: Self-pay

## 2015-02-05 MED ORDER — HYDROCHLOROTHIAZIDE 25 MG PO TABS: 25.0000 mg | ORAL_TABLET | Freq: Every day | ORAL | Status: DC

## 2015-02-05 MED ORDER — ISOSORBIDE MONONITRATE ER 30 MG PO TB24: 30.0000 mg | ORAL_TABLET | Freq: Every day | ORAL | Status: DC

## 2015-04-03 ENCOUNTER — Other Ambulatory Visit: Payer: Medicare Other

## 2015-05-08 ENCOUNTER — Other Ambulatory Visit: Payer: Self-pay | Admitting: Cardiovascular Disease

## 2015-05-29 ENCOUNTER — Encounter: Payer: Medicare Other | Admitting: Pain Medicine

## 2015-06-03 ENCOUNTER — Other Ambulatory Visit: Payer: Self-pay | Admitting: Cardiovascular Disease

## 2015-06-04 ENCOUNTER — Ambulatory Visit: Payer: Medicare Other | Attending: Pain Medicine | Admitting: Pain Medicine

## 2015-06-04 ENCOUNTER — Other Ambulatory Visit: Payer: Self-pay | Admitting: Pain Medicine

## 2015-06-04 ENCOUNTER — Encounter: Payer: Self-pay | Admitting: Pain Medicine

## 2015-06-04 VITALS — BP 113/67 | HR 65 | Temp 98.0°F | Resp 16 | Ht 68.0 in | Wt 210.0 lb

## 2015-06-04 DIAGNOSIS — I10 Essential (primary) hypertension: Secondary | ICD-10-CM | POA: Insufficient documentation

## 2015-06-04 DIAGNOSIS — M1288 Other specific arthropathies, not elsewhere classified, other specified site: Secondary | ICD-10-CM | POA: Diagnosis not present

## 2015-06-04 DIAGNOSIS — F1190 Opioid use, unspecified, uncomplicated: Secondary | ICD-10-CM | POA: Insufficient documentation

## 2015-06-04 DIAGNOSIS — Z79891 Long term (current) use of opiate analgesic: Secondary | ICD-10-CM

## 2015-06-04 DIAGNOSIS — E785 Hyperlipidemia, unspecified: Secondary | ICD-10-CM | POA: Insufficient documentation

## 2015-06-04 DIAGNOSIS — G894 Chronic pain syndrome: Secondary | ICD-10-CM | POA: Diagnosis not present

## 2015-06-04 DIAGNOSIS — Z87891 Personal history of nicotine dependence: Secondary | ICD-10-CM | POA: Diagnosis not present

## 2015-06-04 DIAGNOSIS — M545 Low back pain, unspecified: Secondary | ICD-10-CM

## 2015-06-04 DIAGNOSIS — M5442 Lumbago with sciatica, left side: Secondary | ICD-10-CM | POA: Insufficient documentation

## 2015-06-04 DIAGNOSIS — G8929 Other chronic pain: Secondary | ICD-10-CM | POA: Insufficient documentation

## 2015-06-04 DIAGNOSIS — F1120 Opioid dependence, uncomplicated: Secondary | ICD-10-CM | POA: Insufficient documentation

## 2015-06-04 DIAGNOSIS — M47816 Spondylosis without myelopathy or radiculopathy, lumbar region: Secondary | ICD-10-CM

## 2015-06-04 DIAGNOSIS — M47896 Other spondylosis, lumbar region: Secondary | ICD-10-CM | POA: Diagnosis not present

## 2015-06-04 DIAGNOSIS — K219 Gastro-esophageal reflux disease without esophagitis: Secondary | ICD-10-CM | POA: Diagnosis not present

## 2015-06-04 DIAGNOSIS — Z951 Presence of aortocoronary bypass graft: Secondary | ICD-10-CM | POA: Diagnosis not present

## 2015-06-04 DIAGNOSIS — M4726 Other spondylosis with radiculopathy, lumbar region: Secondary | ICD-10-CM

## 2015-06-04 DIAGNOSIS — I2510 Atherosclerotic heart disease of native coronary artery without angina pectoris: Secondary | ICD-10-CM | POA: Diagnosis not present

## 2015-06-04 DIAGNOSIS — Z79899 Other long term (current) drug therapy: Secondary | ICD-10-CM | POA: Diagnosis not present

## 2015-06-04 DIAGNOSIS — Z5181 Encounter for therapeutic drug level monitoring: Secondary | ICD-10-CM | POA: Insufficient documentation

## 2015-06-04 DIAGNOSIS — M549 Dorsalgia, unspecified: Secondary | ICD-10-CM | POA: Diagnosis present

## 2015-06-04 MED ORDER — OXYCODONE-ACETAMINOPHEN 10-325 MG PO TABS: 1.0000 | ORAL_TABLET | Freq: Four times a day (QID) | ORAL | Status: DC | PRN

## 2015-06-04 MED ORDER — GABAPENTIN 300 MG PO CAPS: 300.0000 mg | ORAL_CAPSULE | Freq: Three times a day (TID) | ORAL | Status: DC

## 2015-06-04 MED ORDER — MAGNESIUM OXIDE 400 MG PO TABS: 400.0000 mg | ORAL_TABLET | Freq: Every day | ORAL | Status: DC

## 2015-06-04 MED ORDER — OXYCODONE-ACETAMINOPHEN 10-325 MG PO TABS: 1.0000 | ORAL_TABLET | Freq: Four times a day (QID) | ORAL | Status: DC

## 2015-06-04 NOTE — Progress Notes (Signed)
Safety precautions to be maintained throughout the outpatient stay will include: orient to surroundings, keep bed in low position, maintain call bell within reach at all times, provide assistance with transfer out of bed and ambulation.  
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
## 2015-06-08 DIAGNOSIS — M47816 Spondylosis without myelopathy or radiculopathy, lumbar region: Secondary | ICD-10-CM | POA: Insufficient documentation

## 2015-06-12 LAB — TOXASSURE SELECT 13 (MW), URINE: PDF: 0

## 2015-06-24 ENCOUNTER — Other Ambulatory Visit: Payer: Self-pay | Admitting: Pain Medicine

## 2015-08-13 ENCOUNTER — Other Ambulatory Visit: Payer: Self-pay | Admitting: Cardiovascular Disease

## 2015-09-04 ENCOUNTER — Encounter: Payer: Self-pay | Admitting: Pain Medicine

## 2015-09-04 ENCOUNTER — Other Ambulatory Visit: Payer: Self-pay | Admitting: Pain Medicine

## 2015-09-04 ENCOUNTER — Ambulatory Visit: Payer: Medicare Other | Attending: Pain Medicine | Admitting: Pain Medicine

## 2015-09-04 VITALS — BP 136/86 | HR 64 | Temp 98.2°F | Resp 18 | Ht 68.0 in | Wt 215.0 lb

## 2015-09-04 DIAGNOSIS — G8929 Other chronic pain: Secondary | ICD-10-CM | POA: Diagnosis not present

## 2015-09-04 DIAGNOSIS — Z5181 Encounter for therapeutic drug level monitoring: Secondary | ICD-10-CM

## 2015-09-04 DIAGNOSIS — M79605 Pain in left leg: Secondary | ICD-10-CM

## 2015-09-04 DIAGNOSIS — M545 Low back pain, unspecified: Secondary | ICD-10-CM

## 2015-09-04 DIAGNOSIS — E559 Vitamin D deficiency, unspecified: Secondary | ICD-10-CM

## 2015-09-04 DIAGNOSIS — M5412 Radiculopathy, cervical region: Secondary | ICD-10-CM

## 2015-09-04 DIAGNOSIS — M5416 Radiculopathy, lumbar region: Secondary | ICD-10-CM

## 2015-09-04 DIAGNOSIS — Z79891 Long term (current) use of opiate analgesic: Secondary | ICD-10-CM

## 2015-09-04 DIAGNOSIS — Z7901 Long term (current) use of anticoagulants: Secondary | ICD-10-CM

## 2015-09-04 DIAGNOSIS — M542 Cervicalgia: Secondary | ICD-10-CM

## 2015-09-04 HISTORY — DX: Radiculopathy, cervical region: M54.12

## 2015-09-04 MED ORDER — OXYCODONE-ACETAMINOPHEN 10-325 MG PO TABS: 1.0000 | ORAL_TABLET | Freq: Four times a day (QID) | ORAL | Status: DC

## 2015-09-04 MED ORDER — OXYCODONE-ACETAMINOPHEN 10-325 MG PO TABS: 1.0000 | ORAL_TABLET | Freq: Four times a day (QID) | ORAL | Status: DC | PRN

## 2015-09-04 MED ORDER — GABAPENTIN 300 MG PO CAPS
300.0000 mg | ORAL_CAPSULE | Freq: Three times a day (TID) | ORAL | Status: DC
Start: 2015-09-04 — End: 2015-12-02

## 2015-09-04 MED ORDER — MAGNESIUM OXIDE 400 MG PO TABS: 400.0000 mg | ORAL_TABLET | Freq: Every day | ORAL | Status: DC

## 2015-09-04 NOTE — Progress Notes (Signed)
Safety precautions to be maintained throughout the outpatient stay will include: orient to surroundings, keep bed in low position, maintain call bell within reach at all times, provide assistance with transfer out of bed and ambulation. Percocet pill count # 41/120  Filled 08-16-15

## 2015-09-04 NOTE — Patient Instructions (Addendum)
Instructed to get labwork drawn in Pre admit testing today. Gabapentin and magnesium sent to pharmacy. Gabapentin titration  Information given

## 2015-09-06 ENCOUNTER — Encounter: Payer: Self-pay | Admitting: Pain Medicine

## 2015-09-06 DIAGNOSIS — M79605 Pain in left leg: Secondary | ICD-10-CM

## 2015-09-06 DIAGNOSIS — Z7901 Long term (current) use of anticoagulants: Secondary | ICD-10-CM | POA: Insufficient documentation

## 2015-09-06 DIAGNOSIS — M542 Cervicalgia: Secondary | ICD-10-CM

## 2015-09-06 DIAGNOSIS — M5416 Radiculopathy, lumbar region: Secondary | ICD-10-CM

## 2015-09-06 DIAGNOSIS — M5412 Radiculopathy, cervical region: Secondary | ICD-10-CM

## 2015-09-06 DIAGNOSIS — G8929 Other chronic pain: Secondary | ICD-10-CM | POA: Insufficient documentation
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
## 2015-09-10 LAB — TOXASSURE SELECT 13 (MW), URINE: PDF: 0

## 2015-09-16 ENCOUNTER — Encounter: Payer: Self-pay | Admitting: Cardiovascular Disease

## 2015-09-16 ENCOUNTER — Other Ambulatory Visit (INDEPENDENT_AMBULATORY_CARE_PROVIDER_SITE_OTHER): Payer: Medicare Other | Admitting: *Deleted

## 2015-09-16 ENCOUNTER — Ambulatory Visit (INDEPENDENT_AMBULATORY_CARE_PROVIDER_SITE_OTHER): Payer: Medicare Other | Admitting: Cardiovascular Disease

## 2015-09-16 VITALS — BP 124/70 | HR 65 | Ht 68.0 in | Wt 206.8 lb

## 2015-09-16 DIAGNOSIS — E785 Hyperlipidemia, unspecified: Secondary | ICD-10-CM | POA: Diagnosis not present

## 2015-09-16 DIAGNOSIS — I2510 Atherosclerotic heart disease of native coronary artery without angina pectoris: Secondary | ICD-10-CM | POA: Diagnosis not present

## 2015-09-16 DIAGNOSIS — I25810 Atherosclerosis of coronary artery bypass graft(s) without angina pectoris: Secondary | ICD-10-CM

## 2015-09-16 DIAGNOSIS — I10 Essential (primary) hypertension: Secondary | ICD-10-CM

## 2015-09-16 LAB — HEPATIC FUNCTION PANEL
ALT: 34 U/L (ref 9–46)
AST: 42 U/L — AB (ref 10–35)
Albumin: 3.8 g/dL (ref 3.6–5.1)
Alkaline Phosphatase: 69 U/L (ref 40–115)
BILIRUBIN DIRECT: 0.2 mg/dL (ref <=0.2)
BILIRUBIN TOTAL: 0.8 mg/dL (ref 0.2–1.2)
Indirect Bilirubin: 0.6 mg/dL (ref 0.2–1.2)
Total Protein: 7.5 g/dL (ref 6.1–8.1)

## 2015-09-16 LAB — LIPID PANEL
CHOL/HDL RATIO: 3.5 ratio (ref <=5.0)
Cholesterol: 121 mg/dL — ABNORMAL LOW (ref 125–200)
HDL: 35 mg/dL — ABNORMAL LOW (ref >=40)
LDL Cholesterol: 71 mg/dL (ref <130)
Triglycerides: 75 mg/dL (ref <150)
VLDL: 15 mg/dL (ref <30)

## 2015-09-16 NOTE — Addendum Note (Signed)
Addended by: Christyl Osentoski K on: 09/16/2015 08:58 AM   Modules accepted: Orders  

## 2015-09-16 NOTE — Patient Instructions (Signed)

## 2015-09-16 NOTE — Progress Notes (Signed)
Cardiology Office Note Date:  09/16/2015   ID:  [name] [name], DOB [birth year], MRN [id number]  PCP:  Feliciana Rossetti, MD  Cardiologist:  [name], MD    No chief complaint on file.  History of Present Illness: [name] [name] is a 65 y.o. male who presents for follow-up of CAD. 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 At follow-up he has had continued patency of his mammary artery and occlusion of all vein grafts. He had a severe anastomotic lesion at the LIMA to LAD insertion site that was treated with stenting several years ago. Follow-up cath demonstrated continued stent patency at the LIMA to LAD insertion.  Patient is doing okay. He is raising his 52 year old grandson and he gets a lot of enjoyment out of that. He denies any cardiac complaints. He specifically denies chest pain, shortness of breath, or leg swelling. He hasn't been exercising regularly but denies exertional symptoms when he does work out periodically.   Past Medical History  Diagnosis Date  . CAD (coronary artery disease)     CABG  . Hyperlipidemia   . Hypertension   . GERD (gastroesophageal reflux disease)   . Chronic pain syndrome   . Radiculitis of right cervical region 09/04/2015    Past Surgical History  Procedure Laterality Date  . Pci 2009 with stenting of lima-lad anastamosis    . Coronary artery bypass graft      multivessel  . Hernia repair    . Cholecystectomy    . Left heart catheterization with coronary/graft angiogram N/A 04/21/2013    Procedure: LEFT HEART CATHETERIZATION WITH [name];  Surgeon: Micheline Chapman, MD;  Location: [facility] CATH LAB;  Service: Cardiovascular;  Laterality: N/A;    Current Outpatient Prescriptions  Medication Sig Dispense Refill  . aspirin EC 325 MG tablet Take 325 mg by mouth daily.    Marland Kitchen atorvastatin (LIPITOR) 20 MG tablet Take 1 tablet (20 mg total) by mouth daily. 90 tablet 3  . Cholecalciferol (VITAMIN D3) 2000  UNITS TABS Take by mouth daily.    . clopidogrel (PLAVIX) 75 MG tablet TAKE 1 TABLET BY MOUTH EVERY DAY 90 tablet 2  . gabapentin (NEURONTIN) 300 MG capsule Take 1 capsule (300 mg total) by mouth 3 (three) times daily. (Patient taking differently: Take 300-900 mg by mouth at bedtime. ) 90 capsule 2  . hydrochlorothiazide (HYDRODIURIL) 25 MG tablet Take 1 tablet (25 mg total) by mouth daily. 90 tablet 3  . isosorbide mononitrate (IMDUR) 30 MG 24 hr tablet Take 1 tablet (30 mg total) by mouth daily. 30 tablet 11  . magnesium oxide (MAG-OX) 400 MG tablet Take 1 tablet (400 mg total) by mouth daily. 30 tablet 2  . metoprolol succinate (TOPROL-XL) 50 MG 24 hr tablet TAKE 1 TABLET BY MOUTH EVERY DAY WITH OR IMMEDIATELY FOLLOWING A MEAL 90 tablet 2  . nitroGLYCERIN (NITROSTAT) 0.4 MG SL tablet Place 1 tablet (0.4 mg total) under the tongue every 5 (five) minutes as needed for chest pain. 25 tablet 3  . oxyCODONE-acetaminophen (PERCOCET) 10-325 MG tablet Take 1 tablet by mouth every 6 (six) hours. 120 tablet 0  . oxyCODONE-acetaminophen (PERCOCET) 10-325 MG tablet Take 1 tablet by mouth every 6 (six) hours as needed for pain. 120 tablet 0  . oxyCODONE-acetaminophen (PERCOCET) 10-325 MG tablet Take 1 tablet by mouth every 6 (six) hours as needed for pain. 120 tablet 0  . pantoprazole (PROTONIX) 40 MG tablet TAKE 1 TABLET  BY MOUTH EVERY DAY 90 tablet 2  . potassium chloride SA (K-DUR,KLOR-CON) 20 MEQ tablet Take 20 mEq by mouth daily.     No current facility-administered medications for this visit.    Allergies:   Review of patient's allergies indicates no known allergies.   Social History:  The patient  reports that he has quit smoking. He has never used smokeless tobacco. He reports that he does not drink alcohol or use illicit drugs.   Family History:  The patient's  family history includes Multiple sclerosis in his mother; Stroke in his father.    ROS:  Please see the history of present illness.   Otherwise, review of systems is positive for back pain, leg pain, muscle pain.  All other systems are reviewed and negative.    PHYSICAL EXAM: VS:  BP 124/70 mmHg  Pulse 65  Ht 5\' 8"  (1.727 m)  Wt 93.804 kg (206 lb 12.8 oz)  BMI 31.45 kg/m2 , BMI Body mass index is 31.45 kg/(m^2). GEN: Well nourished, well developed, in no acute distress HEENT: normal Neck: no JVD, no masses. No carotid bruits Cardiac: RRR without murmur or gallop                Respiratory:  clear to auscultation bilaterally, normal work of breathing GI: soft, nontender, nondistended, + BS MS: no deformity or atrophy Ext: no pretibial edema, pedal pulses 2+= bilaterally Skin: warm and dry, no rash Neuro:  Strength and sensation are intact Psych: euthymic mood, full affect  EKG:  EKG is ordered today. The ekg ordered today shows sinus rhythm 58 bpm, right bundle branch block, no significant change from previous  Recent Labs: No results found for requested labs within last 365 days.   Lipid Panel     Component Value Date/Time   CHOL 188 05/24/2014 1031   TRIG 129.0 05/24/2014 1031   HDL 38.70* 05/24/2014 1031   CHOLHDL 5 05/24/2014 1031   VLDL 25.8 05/24/2014 1031   LDLCALC 124* 05/24/2014 1031   LDLDIRECT 92.7 07/04/2009 0946      Wt Readings from Last 3 Encounters:  09/16/15 93.804 kg (206 lb 12.8 oz)  09/04/15 97.523 kg (215 lb)  06/04/15 95.255 kg (210 lb)    ASSESSMENT AND PLAN: 1.  CAD, native vessel, without angina: The patient appears stable. His medications are reviewed and no changes are made. I will see him back in 6 months.  2. Essential hypertension: Blood pressure controlled on current therapy. Advised increase exercise and focus on weight loss. Discussed at length today.  3. Hyperlipidemia: Lipids drawn this morning. He continues on a statin drug. Lifestyle modification reviewed.  Current medicines are reviewed with the patient today.  The patient does not have concerns regarding  medicines.  Labs/ tests ordered today include:  No orders of the defined types were placed in this encounter.   Disposition:   FU 6 months  Signed, [name], MD  09/16/2015 9:39 AM    [facility] [address] [place], [place], [place]  [address] Phone: [phone]; Fax: [phone]

## 2015-09-16 NOTE — Addendum Note (Signed)
Addended by: Tonita Phoenix on: 09/16/2015 08:58 AM   Modules accepted: Orders

## 2015-10-31 ENCOUNTER — Ambulatory Visit
Admission: RE | Admit: 2015-10-31 | Discharge: 2015-10-31 | Disposition: A | Discharge status: Home / Self Care | Payer: Medicare Other | Source: Ambulatory Visit | Attending: Pain Medicine | Admitting: Pain Medicine

## 2015-10-31 DIAGNOSIS — Q7649 Other congenital malformations of spine, not associated with scoliosis: Secondary | ICD-10-CM | POA: Insufficient documentation

## 2015-10-31 DIAGNOSIS — M4802 Spinal stenosis, cervical region: Secondary | ICD-10-CM | POA: Insufficient documentation

## 2015-10-31 DIAGNOSIS — M5412 Radiculopathy, cervical region: Secondary | ICD-10-CM | POA: Diagnosis present

## 2015-10-31 DIAGNOSIS — G542 Cervical root disorders, not elsewhere classified: Secondary | ICD-10-CM | POA: Insufficient documentation

## 2015-11-06 NOTE — Progress Notes (Signed)
Quick Note:  The results of this diagnostic imaging were reviewed and found to be abnormal. Consider surgical consultation. Neck arrangements for the patient to see a neurosurgeon and sent copies of the MRI. ______

## 2015-11-08 ENCOUNTER — Telehealth: Payer: Self-pay

## 2015-11-08 NOTE — Progress Notes (Signed)
[name] [name] states this has been done.

## 2015-11-27 ENCOUNTER — Encounter: Payer: Medicare Other | Admitting: Pain Medicine

## 2015-12-02 ENCOUNTER — Encounter: Payer: Self-pay | Admitting: Pain Medicine

## 2015-12-02 ENCOUNTER — Ambulatory Visit: Payer: Medicare Other | Attending: Pain Medicine | Admitting: Pain Medicine

## 2015-12-02 VITALS — BP 134/86 | HR 76 | Temp 98.4°F | Resp 16 | Ht 67.0 in | Wt 210.0 lb

## 2015-12-02 DIAGNOSIS — M5412 Radiculopathy, cervical region: Secondary | ICD-10-CM | POA: Insufficient documentation

## 2015-12-02 DIAGNOSIS — M50221 Other cervical disc displacement at C4-C5 level: Secondary | ICD-10-CM | POA: Insufficient documentation

## 2015-12-02 DIAGNOSIS — M4726 Other spondylosis with radiculopathy, lumbar region: Secondary | ICD-10-CM | POA: Insufficient documentation

## 2015-12-02 DIAGNOSIS — I252 Old myocardial infarction: Secondary | ICD-10-CM | POA: Diagnosis not present

## 2015-12-02 DIAGNOSIS — M792 Neuralgia and neuritis, unspecified: Secondary | ICD-10-CM

## 2015-12-02 DIAGNOSIS — R079 Chest pain, unspecified: Secondary | ICD-10-CM | POA: Insufficient documentation

## 2015-12-02 DIAGNOSIS — E559 Vitamin D deficiency, unspecified: Secondary | ICD-10-CM | POA: Insufficient documentation

## 2015-12-02 DIAGNOSIS — Z5181 Encounter for therapeutic drug level monitoring: Secondary | ICD-10-CM | POA: Diagnosis not present

## 2015-12-02 DIAGNOSIS — M129 Arthropathy, unspecified: Secondary | ICD-10-CM | POA: Insufficient documentation

## 2015-12-02 DIAGNOSIS — M79604 Pain in right leg: Secondary | ICD-10-CM | POA: Insufficient documentation

## 2015-12-02 DIAGNOSIS — M79605 Pain in left leg: Secondary | ICD-10-CM | POA: Diagnosis not present

## 2015-12-02 DIAGNOSIS — G8929 Other chronic pain: Secondary | ICD-10-CM | POA: Diagnosis not present

## 2015-12-02 DIAGNOSIS — I10 Essential (primary) hypertension: Secondary | ICD-10-CM | POA: Diagnosis not present

## 2015-12-02 DIAGNOSIS — F1190 Opioid use, unspecified, uncomplicated: Secondary | ICD-10-CM

## 2015-12-02 DIAGNOSIS — Z951 Presence of aortocoronary bypass graft: Secondary | ICD-10-CM | POA: Insufficient documentation

## 2015-12-02 DIAGNOSIS — M4802 Spinal stenosis, cervical region: Secondary | ICD-10-CM | POA: Diagnosis not present

## 2015-12-02 DIAGNOSIS — K219 Gastro-esophageal reflux disease without esophagitis: Secondary | ICD-10-CM | POA: Insufficient documentation

## 2015-12-02 DIAGNOSIS — M545 Low back pain: Secondary | ICD-10-CM | POA: Insufficient documentation

## 2015-12-02 DIAGNOSIS — N281 Cyst of kidney, acquired: Secondary | ICD-10-CM | POA: Diagnosis not present

## 2015-12-02 DIAGNOSIS — M542 Cervicalgia: Secondary | ICD-10-CM | POA: Diagnosis not present

## 2015-12-02 DIAGNOSIS — I25810 Atherosclerosis of coronary artery bypass graft(s) without angina pectoris: Secondary | ICD-10-CM | POA: Insufficient documentation

## 2015-12-02 DIAGNOSIS — E782 Mixed hyperlipidemia: Secondary | ICD-10-CM | POA: Diagnosis not present

## 2015-12-02 DIAGNOSIS — Z87891 Personal history of nicotine dependence: Secondary | ICD-10-CM | POA: Insufficient documentation

## 2015-12-02 DIAGNOSIS — M5481 Occipital neuralgia: Secondary | ICD-10-CM | POA: Diagnosis not present

## 2015-12-02 DIAGNOSIS — M2578 Osteophyte, vertebrae: Secondary | ICD-10-CM | POA: Insufficient documentation

## 2015-12-02 DIAGNOSIS — E7800 Pure hypercholesterolemia, unspecified: Secondary | ICD-10-CM | POA: Diagnosis not present

## 2015-12-02 DIAGNOSIS — Z7902 Long term (current) use of antithrombotics/antiplatelets: Secondary | ICD-10-CM | POA: Diagnosis not present

## 2015-12-02 DIAGNOSIS — Z79891 Long term (current) use of opiate analgesic: Secondary | ICD-10-CM | POA: Insufficient documentation

## 2015-12-02 DIAGNOSIS — M549 Dorsalgia, unspecified: Secondary | ICD-10-CM | POA: Diagnosis present

## 2015-12-02 MED ORDER — GABAPENTIN 300 MG PO CAPS: 300.0000 mg | ORAL_CAPSULE | Freq: Four times a day (QID) | ORAL | Status: DC

## 2015-12-02 MED ORDER — VITAMIN D3 50 MCG (2000 UT) PO CAPS: ORAL_CAPSULE | ORAL | Status: DC

## 2015-12-02 MED ORDER — OXYCODONE HCL 10 MG PO TABS: 10.0000 mg | ORAL_TABLET | Freq: Every day | ORAL | Status: DC | PRN

## 2015-12-02 MED ORDER — OXYCODONE HCL 10 MG PO TABS
10.0000 mg | ORAL_TABLET | Freq: Every day | ORAL | Status: DC | PRN
Start: 2015-12-02 — End: 2016-03-12

## 2015-12-02 MED ORDER — MAGNESIUM OXIDE 400 MG PO TABS: 400.0000 mg | ORAL_TABLET | Freq: Every day | ORAL | Status: DC
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
## 2015-12-02 NOTE — Progress Notes (Signed)
Safety precautions to be maintained throughout the outpatient stay will include: orient to surroundings, keep bed in low position, maintain call bell within reach at all times, provide assistance with transfer out of bed and ambulation. Pill count is 94/120 oxycodone/apap 10-325 mg tablets filled on 11/26/2015.

## 2015-12-07 LAB — TOXASSURE SELECT 13 (MW), URINE

## 2016-02-17 ENCOUNTER — Encounter: Payer: Medicare Other | Admitting: Pain Medicine

## 2016-02-20 ENCOUNTER — Other Ambulatory Visit: Payer: Self-pay | Admitting: *Deleted

## 2016-02-20 MED ORDER — ISOSORBIDE MONONITRATE ER 30 MG PO TB24: 30.0000 mg | ORAL_TABLET | Freq: Every day | ORAL | Status: DC

## 2016-02-21 ENCOUNTER — Encounter: Payer: Medicare Other | Admitting: Pain Medicine

## 2016-03-12 ENCOUNTER — Encounter: Payer: Self-pay | Admitting: Pain Medicine

## 2016-03-12 ENCOUNTER — Ambulatory Visit (HOSPITAL_BASED_OUTPATIENT_CLINIC_OR_DEPARTMENT_OTHER): Payer: Medicare Other | Admitting: Pain Medicine

## 2016-03-12 ENCOUNTER — Other Ambulatory Visit
Admission: RE | Admit: 2016-03-12 | Discharge: 2016-03-12 | Disposition: A | Discharge status: Home / Self Care | Payer: Medicare Other | Source: Ambulatory Visit | Attending: Pain Medicine | Admitting: Pain Medicine

## 2016-03-12 VITALS — BP 135/89 | HR 66 | Temp 98.1°F | Resp 16 | Ht 68.0 in | Wt 210.0 lb

## 2016-03-12 DIAGNOSIS — Z5181 Encounter for therapeutic drug level monitoring: Secondary | ICD-10-CM

## 2016-03-12 DIAGNOSIS — Z79891 Long term (current) use of opiate analgesic: Secondary | ICD-10-CM | POA: Diagnosis not present

## 2016-03-12 DIAGNOSIS — G8929 Other chronic pain: Secondary | ICD-10-CM

## 2016-03-12 DIAGNOSIS — Z7901 Long term (current) use of anticoagulants: Secondary | ICD-10-CM | POA: Insufficient documentation

## 2016-03-12 DIAGNOSIS — M545 Low back pain: Secondary | ICD-10-CM

## 2016-03-12 DIAGNOSIS — R209 Unspecified disturbances of skin sensation: Secondary | ICD-10-CM | POA: Diagnosis not present

## 2016-03-12 DIAGNOSIS — M792 Neuralgia and neuritis, unspecified: Secondary | ICD-10-CM

## 2016-03-12 DIAGNOSIS — M5416 Radiculopathy, lumbar region: Secondary | ICD-10-CM

## 2016-03-12 DIAGNOSIS — F1190 Opioid use, unspecified, uncomplicated: Secondary | ICD-10-CM | POA: Diagnosis not present

## 2016-03-12 DIAGNOSIS — M79605 Pain in left leg: Secondary | ICD-10-CM

## 2016-03-12 DIAGNOSIS — K219 Gastro-esophageal reflux disease without esophagitis: Secondary | ICD-10-CM

## 2016-03-12 LAB — COMPREHENSIVE METABOLIC PANEL
ALBUMIN: 3.6 g/dL (ref 3.5–5.0)
ALT: 43 U/L (ref 17–63)
ANION GAP: 6 (ref 5–15)
AST: 48 U/L — ABNORMAL HIGH (ref 15–41)
Alkaline Phosphatase: 86 U/L (ref 38–126)
BILIRUBIN TOTAL: 1.1 mg/dL (ref 0.3–1.2)
BUN: 7 mg/dL (ref 6–20)
CALCIUM: 8.9 mg/dL (ref 8.9–10.3)
CO2: 31 mmol/L (ref 22–32)
Chloride: 101 mmol/L (ref 101–111)
Creatinine, Ser: 0.85 mg/dL (ref 0.61–1.24)
Glucose, Bld: 120 mg/dL — ABNORMAL HIGH (ref 65–99)
POTASSIUM: 3 mmol/L — AB (ref 3.5–5.1)
Sodium: 138 mmol/L (ref 135–145)
TOTAL PROTEIN: 7.2 g/dL (ref 6.5–8.1)

## 2016-03-12 LAB — PLATELET COUNT: Platelets: 134 10*3/uL — ABNORMAL LOW (ref 150–440)

## 2016-03-12 LAB — APTT: APTT: 34 s (ref 24–36)

## 2016-03-12 LAB — PROTIME-INR
INR: 1.12
PROTHROMBIN TIME: 14.5 s (ref 11.4–15.2)

## 2016-03-12 LAB — PLATELET FUNCTION ASSAY: Collagen / ADP: 209 seconds — ABNORMAL HIGH (ref 0–118)

## 2016-03-12 LAB — C-REACTIVE PROTEIN: CRP: 0.6 mg/dL (ref <1.0)

## 2016-03-12 LAB — VITAMIN B12: Vitamin B-12: 363 pg/mL (ref 180–914)

## 2016-03-12 LAB — SEDIMENTATION RATE: SED RATE: 21 mm/h — AB (ref 0–20)

## 2016-03-12 LAB — MAGNESIUM: Magnesium: 1.9 mg/dL (ref 1.7–2.4)

## 2016-03-12 MED ORDER — MAGNESIUM OXIDE 400 MG PO TABS: 400.0000 mg | ORAL_TABLET | Freq: Every day | ORAL | 2 refills | Status: DC

## 2016-03-12 MED ORDER — OXYCODONE HCL 10 MG PO TABS: 10.0000 mg | ORAL_TABLET | Freq: Every day | ORAL | 0 refills | Status: DC | PRN

## 2016-03-12 MED ORDER — VITAMIN D3 50 MCG (2000 UT) PO CAPS: ORAL_CAPSULE | ORAL | 99 refills | Status: DC

## 2016-03-12 MED ORDER — GABAPENTIN 300 MG PO CAPS: 300.0000 mg | ORAL_CAPSULE | Freq: Four times a day (QID) | ORAL | 2 refills | Status: DC
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
## 2016-03-12 NOTE — Patient Instructions (Signed)
Instructed to go get labwork drawn today in the medical mall

## 2016-03-12 NOTE — Progress Notes (Signed)
Safety precautions to be maintained throughout the outpatient stay will include: orient to surroundings, keep bed in low position, maintain call bell within reach at all times, provide assistance with transfer out of bed and ambulation.  Pills remaining- Oxycodone 10mg  0/150 remain- Pt states for approx a week he took 6 pills aday for increased pain in shoulder and neck Pt has not had neck surgery at present time- states pt can not stop plavix 2 weeks

## 2016-03-15 LAB — 25-HYDROXYVITAMIN D LCMS D2+D3
25-HYDROXY, VITAMIN D-3: 19 ng/mL
25-HYDROXY, VITAMIN D: 21 ng/mL — AB

## 2016-03-15 LAB — 25-HYDROXY VITAMIN D LCMS D2+D3: 25-Hydroxy, Vitamin D-2: 2.2 ng/mL

## 2016-03-22 LAB — TOXASSURE SELECT 13 (MW), URINE: PDF: 0

## 2016-03-23 ENCOUNTER — Other Ambulatory Visit: Payer: Self-pay | Admitting: Cardiovascular Disease

## 2016-04-04 ENCOUNTER — Other Ambulatory Visit: Payer: Self-pay | Admitting: Cardiovascular Disease

## 2016-04-12 ENCOUNTER — Encounter: Payer: Self-pay | Admitting: Pain Medicine

## 2016-04-12 ENCOUNTER — Other Ambulatory Visit: Payer: Self-pay | Admitting: Pain Medicine

## 2016-04-12 DIAGNOSIS — E559 Vitamin D deficiency, unspecified: Secondary | ICD-10-CM

## 2016-04-12 MED ORDER — VITAMIN D (ERGOCALCIFEROL) 1.25 MG (50000 UNIT) PO CAPS: ORAL_CAPSULE | ORAL | 0 refills | Status: DC

## 2016-04-12 NOTE — Progress Notes (Signed)
Low Vitamin D Results Normal levels: between 30 and 100 ng/mL. Vitamin D Insufficiency: Levels between 20-30 ng/ml are defined as a "Vitamin D insufficiency". Vitamin D Deficiency: Levels below 20 ng/ml, is diagnosed as a "Vitamin D Deficiency".  Common causes include: dietary insufficiency; inadequate sun exposure; inability to absorb vitamin D from the intestines; or inability to process it due to kidney or liver disease. Low 25-hydroxyvitamin D: A low blood level of 25-hydroxyvitamin D may mean that a person is not getting enough exposure to sunlight or enough dietary vitamin D to meet his or her body's demand or that there is a problem with its absorption from the intestines. Occasionally, drugs used to treat seizures, particularly phenytoin (Dilantin), can interfere with the production of 25-hydroxyvitamin D in the liver. There is some evidence that vitamin D deficiency may increase the risk of some cancers, immune diseases, and cardiovascular disease. Low 1,25-dihydroxyvitamin D: A low level of 1,25-dihydroxyvitamin D can be seen in kidney disease and is one of the earliest changes to occur in persons with early kidney failure. Associated complications may include: hypocalcemia, hypophosphatemia, and reduced bone density. Associated symptoms: Vitamin D deficiencies and insufficiencies may be associated with fatigue, weakness, bone pain, joint pain, and muscle pain. Recommendations: Patient may benefit from taking over-the-counter Vitamin D3 supplements. I recommend a vitamin D + Calcium supplements. "Natures Bounty", a brand easily found in most pharmacies, has a formulation containing Calcium 1200 mg plus Vitamin D3 1000 IU, in Softgels capsules that are easy to swallow. This should be taken once a day, preferably in the morning as vitamin D will increase energy levels and make it difficult to fall asleep, if taken at night. Patients with levels lower than 20 ng/ml should contact their primary care  physicians to receive replacement therapy. Vitamin D3 can be obtained over-the-counter, without a prescription. Vitamin D2 requires a prescription and it is used for replacement therapy.   

## 2016-04-12 NOTE — Progress Notes (Signed)
- 

## 2016-04-12 NOTE — Progress Notes (Signed)
Low levels of platelets is known as thrombocytopenia. Causes may include: Viral infection (mononucleosis, measles, hepatitis); [place] spotted fever; Platelet autoantibody; Drugs (acetaminophen, quinidine, sulfa drugs); Cirrhosis; Autoimmune disorders; Sepsis; Leukemia, lymphoma; Myelodysplasia; and/or Chemotherapy or radiation therapy. Low platelet count isn't a concern for most surgeries unless the level is less than 50,000 per L. The normal range is 150,000 to 450,000 platelets per microliter (L) of circulating blood. 

## 2016-04-12 NOTE — Progress Notes (Signed)
Potassium levels below 3.6 mmol/L are considered to be low. Levels (less than 2.5 mmol/L) can be life-threatening and requires urgent medical attention. Low potassium (hypokalemia) has many causes. The most common cause is excessive potassium loss in urine due to prescription water or fluid pills (diuretics). Vomiting or diarrhea or both can result in excessive potassium loss from the digestive tract. Causes of potassium loss leading to low potassium include: chronic kidney disease; diabetic ketoacidosis; diarrhea; excessive alcohol use; excessive laxative use; excessive sweating; folic acid deficiency; diuretics; primary aldosteronism; vomiting; and/or some antibiotic use. Normal fasting (NPO x 8 hours) glucose levels are between 65-99 mg/dl, with 2 hour fasting, levels are usually less than 140 mg/dl. Any random blood glucose level greater than 200 mg/dl is considered to be Diabetes. Normal levels of AST are between 5 and 40 U/L. Pregnancy, a muscle injection, or even strenuous exercise may increase AST levels. Acute burns, surgery, and seizures may raise AST levels as well. Very high levels of AST (> 10 X normal) are usually due to acute hepatitis. Levels > 100 X normal can be seen with liver exposure to hepatotoxic substances. Moderate increases may be seen in other diseases of the liver, especially when the bile ducts are blocked, or with cirrhosis or certain cancers of the liver. AST may also increase after heart attacks and with muscle injury, usually to a much greater degree than ALT. In most types of liver disease, the ALT level is higher than AST and the AST/ALT ratio will be low (less than 1). With heart or muscle injury, AST is often much higher than ALT (often 3-5 times as high) and levels tend to stay higher than ALT for longer than with liver injury.

## 2016-04-13 ENCOUNTER — Other Ambulatory Visit: Payer: Self-pay | Admitting: Pain Medicine

## 2016-04-13 DIAGNOSIS — E559 Vitamin D deficiency, unspecified: Secondary | ICD-10-CM

## 2016-05-21 DIAGNOSIS — M5137 Other intervertebral disc degeneration, lumbosacral region: Secondary | ICD-10-CM | POA: Insufficient documentation

## 2016-05-21 DIAGNOSIS — E669 Obesity, unspecified: Secondary | ICD-10-CM | POA: Insufficient documentation

## 2016-05-21 DIAGNOSIS — I2510 Atherosclerotic heart disease of native coronary artery without angina pectoris: Secondary | ICD-10-CM | POA: Insufficient documentation

## 2016-05-21 DIAGNOSIS — M109 Gout, unspecified: Secondary | ICD-10-CM | POA: Insufficient documentation

## 2016-05-21 DIAGNOSIS — Z79899 Other long term (current) drug therapy: Secondary | ICD-10-CM | POA: Insufficient documentation

## 2016-05-21 DIAGNOSIS — Z8739 Personal history of other diseases of the musculoskeletal system and connective tissue: Secondary | ICD-10-CM | POA: Insufficient documentation

## 2016-05-21 DIAGNOSIS — J309 Allergic rhinitis, unspecified: Secondary | ICD-10-CM | POA: Insufficient documentation

## 2016-05-27 ENCOUNTER — Telehealth: Payer: Self-pay | Admitting: *Deleted

## 2016-05-27 ENCOUNTER — Other Ambulatory Visit: Payer: Self-pay | Admitting: Cardiovascular Disease

## 2016-06-01 ENCOUNTER — Ambulatory Visit: Payer: Medicare Other | Attending: Pain Medicine | Admitting: Pain Medicine

## 2016-06-01 ENCOUNTER — Encounter: Payer: Self-pay | Admitting: Pain Medicine

## 2016-06-01 VITALS — BP 149/76 | HR 78 | Temp 98.1°F | Resp 16 | Ht 68.5 in | Wt 209.0 lb

## 2016-06-01 DIAGNOSIS — M792 Neuralgia and neuritis, unspecified: Secondary | ICD-10-CM

## 2016-06-01 DIAGNOSIS — E559 Vitamin D deficiency, unspecified: Secondary | ICD-10-CM | POA: Diagnosis not present

## 2016-06-01 DIAGNOSIS — Z9889 Other specified postprocedural states: Secondary | ICD-10-CM | POA: Diagnosis not present

## 2016-06-01 DIAGNOSIS — Z79899 Other long term (current) drug therapy: Secondary | ICD-10-CM | POA: Diagnosis not present

## 2016-06-01 DIAGNOSIS — Z7689 Persons encountering health services in other specified circumstances: Secondary | ICD-10-CM | POA: Insufficient documentation

## 2016-06-01 DIAGNOSIS — Z9049 Acquired absence of other specified parts of digestive tract: Secondary | ICD-10-CM | POA: Diagnosis not present

## 2016-06-01 DIAGNOSIS — Z79891 Long term (current) use of opiate analgesic: Secondary | ICD-10-CM | POA: Diagnosis not present

## 2016-06-01 DIAGNOSIS — F1190 Opioid use, unspecified, uncomplicated: Secondary | ICD-10-CM

## 2016-06-01 DIAGNOSIS — G894 Chronic pain syndrome: Secondary | ICD-10-CM | POA: Diagnosis not present

## 2016-06-01 DIAGNOSIS — K219 Gastro-esophageal reflux disease without esophagitis: Secondary | ICD-10-CM | POA: Diagnosis not present

## 2016-06-01 MED ORDER — MAGNESIUM OXIDE 400 MG PO TABS: 400.0000 mg | ORAL_TABLET | Freq: Every day | ORAL | 2 refills | Status: DC

## 2016-06-01 MED ORDER — OXYCODONE HCL 10 MG PO TABS: 10.0000 mg | ORAL_TABLET | Freq: Every day | ORAL | 0 refills | Status: DC | PRN

## 2016-06-01 MED ORDER — GABAPENTIN 300 MG PO CAPS: 300.0000 mg | ORAL_CAPSULE | Freq: Four times a day (QID) | ORAL | 2 refills | Status: DC
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
## 2016-06-01 NOTE — Progress Notes (Signed)
Nursing Pain Medication Assessment:  Safety precautions to be maintained throughout the outpatient stay will include: orient to surroundings, keep bed in low position, maintain call bell within reach at all times, provide assistance with transfer out of bed and ambulation.  Medication Inspection Compliance: Pill count conducted under aseptic conditions, in front of the patient. Neither the pills nor the bottle was removed from the patient's sight at any time. Once count was completed pills were immediately returned to the patient in their original bottle. Pill Count: 83 of 150 pills remain Bottle Appearance: Standard pharmacy container. Clearly labeled. Medication: See above Filled Date: 10 / 14 / 2017

## 2016-06-03 DIAGNOSIS — E876 Hypokalemia: Secondary | ICD-10-CM | POA: Insufficient documentation

## 2016-06-18 ENCOUNTER — Ambulatory Visit (INDEPENDENT_AMBULATORY_CARE_PROVIDER_SITE_OTHER): Payer: Medicare Other | Admitting: Cardiovascular Disease

## 2016-06-18 ENCOUNTER — Encounter: Payer: Self-pay | Admitting: Cardiovascular Disease

## 2016-06-18 VITALS — BP 162/92 | HR 65 | Ht 68.5 in | Wt 201.8 lb

## 2016-06-18 DIAGNOSIS — I25810 Atherosclerosis of coronary artery bypass graft(s) without angina pectoris: Secondary | ICD-10-CM

## 2016-06-18 DIAGNOSIS — E785 Hyperlipidemia, unspecified: Secondary | ICD-10-CM | POA: Diagnosis not present

## 2016-06-18 DIAGNOSIS — I10 Essential (primary) hypertension: Secondary | ICD-10-CM | POA: Diagnosis not present

## 2016-06-18 LAB — COMPREHENSIVE METABOLIC PANEL
ALT: 28 U/L (ref 9–46)
AST: 38 U/L — ABNORMAL HIGH (ref 10–35)
Albumin: 3.9 g/dL (ref 3.6–5.1)
Alkaline Phosphatase: 89 U/L (ref 40–115)
BUN: 9 mg/dL (ref 7–25)
CHLORIDE: 102 mmol/L (ref 98–110)
CO2: 28 mmol/L (ref 20–31)
CREATININE: 0.85 mg/dL (ref 0.70–1.25)
Calcium: 9.2 mg/dL (ref 8.6–10.3)
Glucose, Bld: 101 mg/dL — ABNORMAL HIGH (ref 65–99)
Potassium: 3.9 mmol/L (ref 3.5–5.3)
SODIUM: 139 mmol/L (ref 135–146)
TOTAL PROTEIN: 7.3 g/dL (ref 6.1–8.1)
Total Bilirubin: 0.7 mg/dL (ref 0.2–1.2)

## 2016-06-18 LAB — LIPID PANEL
CHOLESTEROL: 160 mg/dL (ref <200)
HDL: 42 mg/dL (ref >40)
LDL CALC: 99 mg/dL (ref <100)
Total CHOL/HDL Ratio: 3.8 Ratio (ref <5.0)
Triglycerides: 93 mg/dL (ref <150)
VLDL: 19 mg/dL (ref <30)

## 2016-06-18 MED ORDER — ISOSORBIDE MONONITRATE ER 30 MG PO TB24: 30.0000 mg | ORAL_TABLET | Freq: Every day | ORAL | 8 refills | Status: DC

## 2016-06-18 NOTE — Patient Instructions (Signed)
Medication Instructions:  Your physician recommends that you continue on your current medications as directed. Please refer to the Current Medication list given to you today.  Labwork: Your physician recommends that you have lab work today: CMP and LIPID  Testing/Procedures: No new orders.   Follow-Up: Your physician wants you to follow-up in: 6 MONTHS with Dr Cooper.  You will receive a reminder letter in the mail two months in advance. If you don't receive a letter, please call our office to schedule the follow-up appointment.   Any Other Special Instructions Will Be Listed Below (If Applicable).     If you need a refill on your cardiac medications before your next appointment, please call your pharmacy.   

## 2016-06-18 NOTE — Progress Notes (Signed)
Cardiology Office Note Date:  06/18/2016   ID:  LANDYNN DUPLER, DOB Sep 01, 1950, MRN [id number]  PCP:  Gilford Rile, MD  Cardiologist:  Sherren Mocha, MD    Chief Complaint  Patient presents with  . Coronary Artery Disease     History of Present Illness: [name] [name] is a 65 y.o. male who presents for  follow-up of CAD. 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 At follow-up he has had continued patency of his mammary artery and occlusion of all vein grafts. He had a severe anastomotic lesion at the LIMA to LAD insertion site that was treated with stenting several years ago. Follow-up cath demonstrated continued stent patency at the LIMA to LAD insertion.  The patient is doing well. He's physically active without exertional symptoms. He denies chest pain, chest pressure, shortness of breath, or leg swelling. He denies heart palpitations, lightheadedness, or syncope. He has run out of isosorbide. He has not yet taken his beta blocker this morning.   Past Medical History:  Diagnosis Date  . CAD (coronary artery disease)    CABG  . Chronic pain syndrome   . GERD (gastroesophageal reflux disease)   . Hyperlipidemia   . Hypertension   . Radiculitis of right cervical region 09/04/2015    Past Surgical History:  Procedure Laterality Date  . CHOLECYSTECTOMY    . CORONARY ARTERY BYPASS GRAFT     multivessel  . HERNIA REPAIR    . LEFT HEART CATHETERIZATION WITH CORONARY/GRAFT ANGIOGRAM N/A 04/21/2013   Procedure: LEFT HEART CATHETERIZATION WITH Beatrix Fetters;  Surgeon: Blane Ohara, MD;  Location: [facility] CATH LAB;  Service: Cardiovascular;  Laterality: N/A;  . PCI 2009 with stenting of LIMA-LAD anastamosis      Current Outpatient Prescriptions  Medication Sig Dispense Refill  . aspirin (GOODSENSE ASPIRIN) 325 MG tablet Take 325 mg by mouth daily.     Marland Kitchen atorvastatin (LIPITOR) 20 MG tablet Take 1 tablet (20 mg total) by mouth daily. 90  tablet 3  . Cholecalciferol (VITAMIN D3) 2000 units capsule Take 1 capsule (2,000 Units total) by mouth daily. 30 capsule PRN  . clopidogrel (PLAVIX) 75 MG tablet TAKE 1 TABLET BY MOUTH EVERY DAY 90 tablet 0  . gabapentin (NEURONTIN) 300 MG capsule Take 1-3 capsules (300-900 mg total) by mouth 4 (four) times daily. 360 capsule 2  . hydrochlorothiazide (HYDRODIURIL) 25 MG tablet TAKE 1 TABLET(25 MG) BY MOUTH DAILY 90 tablet 0  . isosorbide mononitrate (IMDUR) 30 MG 24 hr tablet Take 1 tablet (30 mg total) by mouth daily. 30 tablet 8  . magnesium oxide (MAG-OX) 400 MG tablet Take 1 tablet (400 mg total) by mouth daily. 30 tablet 2  . metoprolol succinate (TOPROL-XL) 50 MG 24 hr tablet TAKE 1 TABLET BY MOUTH EVERY DAY WITH OR IMMEDIATELY FOLLOWING A MEAL 90 tablet 1  . nitroGLYCERIN (NITROSTAT) 0.4 MG SL tablet Place 1 tablet (0.4 mg total) under the tongue every 5 (five) minutes as needed for chest pain. 25 tablet 3  . Oxycodone HCl 10 MG TABS Take 1 tablet (10 mg total) by mouth 5 (five) times daily as needed. 150 tablet 0  . [START ON 07/10/2016] Oxycodone HCl 10 MG TABS Take 1 tablet (10 mg total) by mouth 5 (five) times daily as needed. 150 tablet 0  . [START ON 08/09/2016] Oxycodone HCl 10 MG TABS Take 1 tablet (10 mg total) by mouth 5 (five) times daily as needed. 150 tablet 0  .  pantoprazole (PROTONIX) 40 MG tablet TAKE 1 TABLET BY MOUTH EVERY DAY 90 tablet 0  . potassium chloride SA (K-DUR,KLOR-CON) 20 MEQ tablet Take 20 mEq by mouth daily.    . Vitamin D, Ergocalciferol, (DRISDOL) 50000 units CAPS capsule Take 1 capsule (50,000 Units total) by mouth 2 (two) times a week. X 6 weeks. 12 capsule 0   No current facility-administered medications for this visit.     Allergies:   Patient has no known allergies.   Social History:  The patient  reports that he has quit smoking. He has never used smokeless tobacco. He reports that he does not drink alcohol or use drugs.   Family History:  The  patient's  family history includes Multiple sclerosis in his mother; Stroke in his father.    ROS:  Please see the history of present illness.  Otherwise, review of systems is positive for back pain.  All other systems are reviewed and negative.    PHYSICAL EXAM: VS:  BP (!) 162/92   Pulse 65   Ht 5' 8.5" (1.74 m)   Wt 201 lb 12 oz (91.5 kg)   BMI 30.23 kg/m  , BMI Body mass index is 30.23 kg/m. GEN: Well nourished, well developed, in no acute distress  HEENT: normal  Neck: no JVD, no masses. No carotid bruits Cardiac: RRR without murmur or gallop                Respiratory:  clear to auscultation bilaterally, normal work of breathing GI: soft, nontender, nondistended, + BS MS: no deformity or atrophy  Ext: no pretibial edema, pedal pulses 2+= bilaterally Skin: warm and dry, no rash Neuro:  Strength and sensation are intact Psych: euthymic mood, full affect  EKG:  EKG is ordered today. The ekg ordered today shows NSR 65 bpm, RBBB, no change from old tracings.  Recent Labs: 03/12/2016: ALT 43; BUN 7; Creatinine, Ser 0.85; Magnesium 1.9; Platelets 134; Potassium 3.0; Sodium 138   Lipid Panel     Component Value Date/Time   CHOL 121 (L) 09/16/2015 0858   TRIG 75 09/16/2015 0858   HDL 35 (L) 09/16/2015 0858   CHOLHDL 3.5 09/16/2015 0858   VLDL 15 09/16/2015 0858   LDLCALC 71 09/16/2015 0858   LDLDIRECT 92.7 07/04/2009 0946      Wt Readings from Last 3 Encounters:  06/18/16 201 lb 12 oz (91.5 kg)  06/01/16 209 lb (94.8 kg)  03/12/16 210 lb (95.3 kg)     ASSESSMENT AND PLAN: 1.  CAD, native vessel, without angina: on long-term DAPT with ASA and plavix. Should reduce ASA to 81 mg daily.  2. Essential hypertension: Blood pressure elevated today. Hasn't taken his Toprol this am. He is out of isosorbide. Will refill medications, reviewed importance of medication adherence.   3. Hyperlipidemia: Has been on atorvastatin. Check lipids today.  4. Hypokalemia: has been on  HCTZ. Now on KDur. Check metabolic panel today.  Current medicines are reviewed with the patient today.  The patient does not have concerns regarding medicines.  Labs/ tests ordered today include:   Orders Placed This Encounter  Procedures  . Comp Met (CMET)  . Lipid panel  . EKG 12-Lead    Disposition:   FU 6 months  Signed, Sherren Mocha, MD  06/18/2016 1:39 PM    [facility] [place], [place],   [address] Phone: [phone]; Fax: [phone]

## 2016-06-22 ENCOUNTER — Ambulatory Visit: Payer: Medicare Other | Admitting: Cardiovascular Disease

## 2016-07-02 ENCOUNTER — Other Ambulatory Visit: Payer: Self-pay | Admitting: Cardiovascular Disease

## 2016-07-24 ENCOUNTER — Other Ambulatory Visit: Payer: Self-pay | Admitting: Cardiovascular Disease

## 2016-08-31 ENCOUNTER — Other Ambulatory Visit: Payer: Self-pay | Admitting: *Deleted

## 2016-08-31 DIAGNOSIS — I10 Essential (primary) hypertension: Secondary | ICD-10-CM

## 2016-08-31 DIAGNOSIS — I25810 Atherosclerosis of coronary artery bypass graft(s) without angina pectoris: Secondary | ICD-10-CM

## 2016-08-31 DIAGNOSIS — E785 Hyperlipidemia, unspecified: Secondary | ICD-10-CM

## 2016-08-31 MED ORDER — CLOPIDOGREL BISULFATE 75 MG PO TABS: 75.0000 mg | ORAL_TABLET | Freq: Every day | ORAL | 2 refills | Status: DC

## 2016-08-31 MED ORDER — ISOSORBIDE MONONITRATE ER 30 MG PO TB24: 30.0000 mg | ORAL_TABLET | Freq: Every day | ORAL | 2 refills | Status: DC

## 2016-08-31 MED ORDER — HYDROCHLOROTHIAZIDE 25 MG PO TABS: 25.0000 mg | ORAL_TABLET | Freq: Every day | ORAL | 2 refills | Status: DC

## 2016-08-31 MED ORDER — METOPROLOL SUCCINATE ER 50 MG PO TB24: ORAL_TABLET | ORAL | 2 refills | Status: DC

## 2016-08-31 MED ORDER — NITROGLYCERIN 0.4 MG SL SUBL: 0.4000 mg | SUBLINGUAL_TABLET | SUBLINGUAL | 1 refills | Status: DC | PRN

## 2016-08-31 MED ORDER — ATORVASTATIN CALCIUM 20 MG PO TABS: 20.0000 mg | ORAL_TABLET | Freq: Every day | ORAL | 2 refills | Status: DC

## 2016-09-01 ENCOUNTER — Ambulatory Visit: Payer: Medicare Other | Attending: Pain Medicine | Admitting: Pain Medicine

## 2016-09-01 ENCOUNTER — Encounter: Payer: Self-pay | Admitting: Pain Medicine

## 2016-09-01 VITALS — BP 132/74 | HR 64 | Temp 98.5°F | Resp 16 | Ht 68.0 in | Wt 200.0 lb

## 2016-09-01 DIAGNOSIS — Z9049 Acquired absence of other specified parts of digestive tract: Secondary | ICD-10-CM | POA: Insufficient documentation

## 2016-09-01 DIAGNOSIS — Z9889 Other specified postprocedural states: Secondary | ICD-10-CM | POA: Insufficient documentation

## 2016-09-01 DIAGNOSIS — K219 Gastro-esophageal reflux disease without esophagitis: Secondary | ICD-10-CM

## 2016-09-01 DIAGNOSIS — M5412 Radiculopathy, cervical region: Secondary | ICD-10-CM | POA: Diagnosis not present

## 2016-09-01 DIAGNOSIS — G8929 Other chronic pain: Secondary | ICD-10-CM

## 2016-09-01 DIAGNOSIS — M79605 Pain in left leg: Secondary | ICD-10-CM | POA: Insufficient documentation

## 2016-09-01 DIAGNOSIS — M5442 Lumbago with sciatica, left side: Secondary | ICD-10-CM

## 2016-09-01 DIAGNOSIS — E559 Vitamin D deficiency, unspecified: Secondary | ICD-10-CM | POA: Diagnosis not present

## 2016-09-01 DIAGNOSIS — M5441 Lumbago with sciatica, right side: Secondary | ICD-10-CM

## 2016-09-01 DIAGNOSIS — M25512 Pain in left shoulder: Secondary | ICD-10-CM | POA: Insufficient documentation

## 2016-09-01 DIAGNOSIS — M792 Neuralgia and neuritis, unspecified: Secondary | ICD-10-CM

## 2016-09-01 DIAGNOSIS — Z7982 Long term (current) use of aspirin: Secondary | ICD-10-CM | POA: Insufficient documentation

## 2016-09-01 DIAGNOSIS — Z79891 Long term (current) use of opiate analgesic: Secondary | ICD-10-CM

## 2016-09-01 DIAGNOSIS — G894 Chronic pain syndrome: Secondary | ICD-10-CM

## 2016-09-01 DIAGNOSIS — F1190 Opioid use, unspecified, uncomplicated: Secondary | ICD-10-CM

## 2016-09-01 DIAGNOSIS — M5416 Radiculopathy, lumbar region: Secondary | ICD-10-CM

## 2016-09-01 DIAGNOSIS — Z79899 Other long term (current) drug therapy: Secondary | ICD-10-CM | POA: Diagnosis not present

## 2016-09-01 DIAGNOSIS — Z5181 Encounter for therapeutic drug level monitoring: Secondary | ICD-10-CM | POA: Diagnosis not present

## 2016-09-01 MED ORDER — GABAPENTIN 300 MG PO CAPS: 300.0000 mg | ORAL_CAPSULE | Freq: Four times a day (QID) | ORAL | 2 refills | Status: DC

## 2016-09-01 MED ORDER — OXYCODONE HCL 10 MG PO TABS: 10.0000 mg | ORAL_TABLET | Freq: Every day | ORAL | 0 refills | Status: DC | PRN

## 2016-09-01 MED ORDER — VITAMIN D3 50 MCG (2000 UT) PO CAPS: ORAL_CAPSULE | ORAL | 99 refills | Status: DC

## 2016-09-01 MED ORDER — MAGNESIUM OXIDE 400 MG PO TABS: 400.0000 mg | ORAL_TABLET | Freq: Every day | ORAL | 0 refills | Status: DC
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
## 2016-09-01 NOTE — Progress Notes (Signed)
Nursing Pain Medication Assessment:  Safety precautions to be maintained throughout the outpatient stay will include: orient to surroundings, keep bed in low position, maintain call bell within reach at all times, provide assistance with transfer out of bed and ambulation.  Medication Inspection Compliance: Pill count conducted under aseptic conditions, in front of the patient. Neither the pills nor the bottle was removed from the patient's sight at any time. Once count was completed pills were immediately returned to the patient in their original bottle.  Medication: See above Pill/Patch Count: 67 of 150 pills remain Bottle Appearance: Standard pharmacy container. Clearly labeled. Filled Date: 01 / 13 / 2017 Last Medication intake:  09/01/2016 at 0500

## 2016-09-14 ENCOUNTER — Telehealth: Payer: Self-pay | Admitting: Cardiovascular Disease

## 2016-09-14 NOTE — Telephone Encounter (Signed)
I called the pt's phone but his voicemail is full and will not allow me to leave a message.

## 2016-09-14 NOTE — Telephone Encounter (Signed)
[name] [name] states that he will have to catch a flight to [place] for a funeral. He would like to know if it is okay for him to fly. This is his first time flying and so if it is okay to fly, he would to know which medication he could take to help him relax on plane. Please call, thanks.

## 2016-09-15 NOTE — Telephone Encounter (Signed)
I spoke with the pt and made him aware that he is okay to fly from a heart stand point. I advised him that we cannot prescribe any anxiety medications and this should be discussed with his PCP. I also advised him that due to his pain medication which is prescribed by his back doctor that he should discuss whether or not he can take anything for anxiety with this physician due to possible medication interactions. Pt agreed with plan.

## 2016-10-05 DIAGNOSIS — D649 Anemia, unspecified: Secondary | ICD-10-CM | POA: Insufficient documentation

## 2016-10-05 DIAGNOSIS — R7303 Prediabetes: Secondary | ICD-10-CM | POA: Insufficient documentation

## 2016-10-05 DIAGNOSIS — D638 Anemia in other chronic diseases classified elsewhere: Secondary | ICD-10-CM | POA: Insufficient documentation

## 2016-11-17 ENCOUNTER — Ambulatory Visit: Payer: Medicare Other | Attending: Nurse Practitioner | Admitting: Nurse Practitioner

## 2016-11-17 ENCOUNTER — Encounter: Payer: Self-pay | Admitting: Nurse Practitioner

## 2016-11-17 VITALS — BP 140/79 | HR 85 | Temp 97.9°F | Resp 16 | Ht 68.0 in | Wt 198.0 lb

## 2016-11-17 DIAGNOSIS — G8929 Other chronic pain: Secondary | ICD-10-CM

## 2016-11-17 DIAGNOSIS — Z9889 Other specified postprocedural states: Secondary | ICD-10-CM | POA: Insufficient documentation

## 2016-11-17 DIAGNOSIS — M79605 Pain in left leg: Secondary | ICD-10-CM | POA: Diagnosis not present

## 2016-11-17 DIAGNOSIS — M5412 Radiculopathy, cervical region: Secondary | ICD-10-CM | POA: Insufficient documentation

## 2016-11-17 DIAGNOSIS — Z7902 Long term (current) use of antithrombotics/antiplatelets: Secondary | ICD-10-CM | POA: Insufficient documentation

## 2016-11-17 DIAGNOSIS — Z79899 Other long term (current) drug therapy: Secondary | ICD-10-CM | POA: Insufficient documentation

## 2016-11-17 DIAGNOSIS — I2510 Atherosclerotic heart disease of native coronary artery without angina pectoris: Secondary | ICD-10-CM | POA: Diagnosis not present

## 2016-11-17 DIAGNOSIS — Z87891 Personal history of nicotine dependence: Secondary | ICD-10-CM | POA: Diagnosis not present

## 2016-11-17 DIAGNOSIS — K219 Gastro-esophageal reflux disease without esophagitis: Secondary | ICD-10-CM | POA: Insufficient documentation

## 2016-11-17 DIAGNOSIS — E782 Mixed hyperlipidemia: Secondary | ICD-10-CM | POA: Diagnosis not present

## 2016-11-17 DIAGNOSIS — M792 Neuralgia and neuritis, unspecified: Secondary | ICD-10-CM | POA: Diagnosis not present

## 2016-11-17 DIAGNOSIS — M5441 Lumbago with sciatica, right side: Secondary | ICD-10-CM

## 2016-11-17 DIAGNOSIS — Z9049 Acquired absence of other specified parts of digestive tract: Secondary | ICD-10-CM | POA: Insufficient documentation

## 2016-11-17 DIAGNOSIS — Z7982 Long term (current) use of aspirin: Secondary | ICD-10-CM | POA: Insufficient documentation

## 2016-11-17 DIAGNOSIS — Z79891 Long term (current) use of opiate analgesic: Secondary | ICD-10-CM | POA: Insufficient documentation

## 2016-11-17 DIAGNOSIS — R079 Chest pain, unspecified: Secondary | ICD-10-CM | POA: Insufficient documentation

## 2016-11-17 DIAGNOSIS — E559 Vitamin D deficiency, unspecified: Secondary | ICD-10-CM | POA: Diagnosis not present

## 2016-11-17 DIAGNOSIS — I10 Essential (primary) hypertension: Secondary | ICD-10-CM | POA: Diagnosis not present

## 2016-11-17 DIAGNOSIS — Z5181 Encounter for therapeutic drug level monitoring: Secondary | ICD-10-CM | POA: Insufficient documentation

## 2016-11-17 DIAGNOSIS — M5442 Lumbago with sciatica, left side: Secondary | ICD-10-CM

## 2016-11-17 DIAGNOSIS — G894 Chronic pain syndrome: Secondary | ICD-10-CM | POA: Diagnosis not present

## 2016-11-17 MED ORDER — OXYCODONE HCL 10 MG PO TABS: 10.0000 mg | ORAL_TABLET | Freq: Every day | ORAL | 0 refills | Status: DC | PRN

## 2016-11-17 MED ORDER — MAGNESIUM OXIDE 400 MG PO TABS: 400.0000 mg | ORAL_TABLET | Freq: Every day | ORAL | 0 refills | Status: DC

## 2016-11-17 MED ORDER — GABAPENTIN 300 MG PO CAPS: 300.0000 mg | ORAL_CAPSULE | Freq: Four times a day (QID) | ORAL | 2 refills | Status: DC
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
## 2016-11-17 NOTE — Progress Notes (Signed)
Nursing Pain Medication Assessment:  Safety precautions to be maintained throughout the outpatient stay will include: orient to surroundings, keep bed in low position, maintain call bell within reach at all times, provide assistance with transfer out of bed and ambulation.  Medication Inspection Compliance: Pill count conducted under aseptic conditions, in front of the patient. Neither the pills nor the bottle was removed from the patient's sight at any time. Once count was completed pills were immediately returned to the patient in their original bottle.  Medication: See above Pill/Patch Count: 128 of 150 pills remain Pill/Patch Appearance: Markings consistent with prescribed medication Bottle Appearance: Standard pharmacy container. Clearly labeled. Filled Date: 04 / 12 / 2018 Last Medication intake:  Yesterday
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
## 2016-11-17 NOTE — Progress Notes (Deleted)
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 rigidity","Clasp-knife rigidity","Give-away weakness","Deconditioned","Mild-to-medarate deconditioning","Moderate-to-severe deconditioning","Guarding","WNL","Unremarkable","Grossly normal","Grossly intact","Functionally intact"}  Muscle strength & Tone: {Blank single:19197::"Inconsistent level of performance when tested","Normal strength (5/5)","Movement possible against some resistance (4/5)","Movement possible against gravity, but not against resistance (3/5)","Movement possible, but not against gravity (1/7)","OHYWVP flickering, but no movement (1/5)","No motor contraction (0/5)","Flaccid paralysis","Spastic paralysis","Cogwheel rigidity","Clasp-knife rigidity","Give-away weakness","Deconditioned","Mild-to-medarate deconditioning","Moderate-to-severe deconditioning","Guarding","WNL","Unremarkable","Grossly normal","Grossly intact","Functionally intact"}  Sensory: {Blank single:19197::"Improved","Movement-associated pain","Movement-associated discomfort","Impaired sensorium","Articular pain  pattern","Arthropathic arthralgia","Dermatomal pain pattern","Non-dermatomal pain pattern","Musculoskeletal pain pattern","Myotome pain pattern","Neurogenic pain pattern","Neuropathic pain pattern","Referred pain pattern","Visceral pain pattern","Allodynia (Painful response to non-painful stimuli)","Anesthesia (Absence of sensation)","Anesthesia Dolorosa (Numbness over painful area)","Dysesthesias (Unpleasant sensation to touch)","Hyperalgesia (Increased sensitivity to pain)","Hyperesthesia (Increased sensitivity to touch)","Hyperpathia (Painful, exaggerated response to nociceptive stimuli)","Hypoalgesia (Decreased sensitivity to painful stimuli)","Hypoesthesia/Hypesthesia (Reduced sensation to touch)","Paresthesia (Burning sensation)","Paresthesia (Tingling sensation)","WNL","No anomaly detected","Unimpaired"}  Sensory: {Blank single:19197::"Improved","Movement-associated pain","Movement-associated discomfort","Impaired sensorium","Articular pain pattern","Arthropathic arthralgia","Dermatomal pain pattern","Non-dermatomal pain pattern","Musculoskeletal pain pattern","Myotome pain pattern","Neurogenic pain pattern","Neuropathic pain pattern","Referred pain pattern","Visceral pain pattern","Allodynia (Painful response to non-painful stimuli)","Anesthesia (Absence of sensation)","Anesthesia Dolorosa (  Numbness over painful area)","Dysesthesias (Unpleasant sensation to touch)","Hyperalgesia (Increased sensitivity to pain)","Hyperesthesia (Increased sensitivity to touch)","Hyperpathia (Painful, exaggerated response to nociceptive stimuli)","Hypoalgesia (Decreased sensitivity to painful stimuli)","Hypoesthesia/Hypesthesia (Reduced sensation to touch)","Paresthesia (Burning sensation)","Paresthesia (Tingling sensation)","WNL","No anomaly detected","Unimpaired"}  Palpation: {Blank single:19197::"Tender","Complains of area being tender to palpation","Uncomfortable","Positive","Negative","Increased muscle tone","Trigger  Point","Muscular Atrophy","Non-tender","No complaints of tenderness","Hyperthermic","Hypothermic","Euthermic","No palpable anomalies"}  Palpation: {Blank single:19197::"Tender","Complains of area being tender to palpation","Uncomfortable","Positive","Negative","Increased muscle tone","Trigger Point","Muscular Atrophy","Non-tender","No complaints of tenderness","Hyperthermic","Hypothermic","Euthermic","No palpable anomalies"}  Specialized Test(s): {Blank single:19197::"Tinel's","Phalen's","Tinel's/Phalen's","Deferred"} {Blank single:19197::"(+)","(-)","(+)/(-)","(-)/(+)","      "}  Specialized Test(s): {Blank single:19197::"Tinel's","Phalen's","Tinel's/Phalen's","Deferred"} {Blank single:19197::"(+)","(-)","(+)/(-)","(-)/(+)","      "}   Thoracic Spine Exam  Inspection: {Blank single:19197::"Well healed scar from previous spine surgery detected","increased thoracic Kyphosis","Significant thoracic kyphosis","Paravertebral muscle atrophy","No masses, redness, or swelling"} Alignment: {Blank single:19197::"Asymmetric","Symmetrical"} Functional ROM: {Blank single:19197::"Improved after treatment","Adequate ROM","Decreased ROM","Diminished ROM","Full ROM","Fused","Grossly intact ROM","Guarding","Limited ROM","Mechanically restricted ROM","Minimal ROM","Pain restricted ROM","Restricted ROM","Zero ROM","ROM appears unrestricted","ROM is within functional limits (WFL)","ROM is within normal limits (WNL)","Unrestricted ROM"} Stability: {Blank single:19197::"Possibly unstable","No instability detected"} Sensory: {Blank single:19197::"Improved","Movement associated pain","Movement associated discomfort","Impaired sensorium","Articular pain pattern","Dermatomal pain pattern","Musculoskeletal pain pattern","Myotome pain pattern","Neurogenic pain pattern","Neuropathic pain pattern","Referred pain pattern","Visceral pain pattern","Allodynia (Painful response to non-painful stimuli)","Anesthesia (Absence of  sensation)","Anesthesia Dolorosa (Numbness over painful area)","Dysesthesias (Unpleasant sensation to touch)","Hyperalgesia (Increased sensitivity to pain)","Hyperesthesia (Increased sensitivity to touch)","Hyperpathia (Painful, exaggerated response to nociceptive stimuli)","Hypoalgesia (Decreased sensitivity to painful stimuli)","Hypoesthesia/Hypesthesia (Reduced sensation to touch)","Paresthesia (Burning sensation)","Paresthesia (Tingling sensation)","WNL","No anomaly detected","Unimpaired"} Muscle strength & Tone: {Blank single:19197::"Tender","Complains of area being tender to palpation","Uncomfortable","Positive","Negative","Increased muscle tone","Trigger Point","Muscular Atrophy","Non-tender","No complaints of tenderness","Hyperthermic","Hypothermic","Euthermic","No palpable anomalies"}  Lumbar Spine Exam  Inspection: {Blank single:19197::"Well healed scar from previous spine surgery detected","Thoraco-lumbar Scoliosis","Lumbar Scoliosis","Paravertebral muscle atrophy","No masses, redness, or swelling"} Alignment: {Blank single:19197::"Scoliosis detected","Levoscoliosis","Dextroscoliosis","Asymmetric","Symmetrical"} Functional ROM: {Blank single:19197::"Improved after treatment","Adequate ROM","Decreased ROM","Diminished ROM","Full ROM","Fused","Grossly intact ROM","Guarding","Limited ROM","Mechanically restricted ROM","Minimal ROM","Pain restricted ROM","Restricted ROM","Zero ROM","ROM appears unrestricted","ROM is within functional limits (WFL)","ROM is within normal limits (WNL)","Unrestricted ROM"} Stability: {Blank single:19197::"Possibly unstable","No instability detected"} Muscle strength & Tone: {Blank single:19197::"Increased muscle tone over affected area","Inconsistent level of performance when tested","Functionally intact"} Sensory: {Blank single:19197::"Improved","Movement-associated pain","Movement-associated discomfort","Impaired sensorium","Articular pain pattern","Dermatomal pain  pattern","Musculoskeletal pain pattern","Myotome pain pattern","Neurogenic pain pattern","Neuropathic pain pattern","Referred pain pattern","Visceral pain pattern","Allodynia (Painful response to non-painful stimuli)","Anesthesia (Absence of sensation)","Anesthesia Dolorosa (Numbness over painful area)","Dysesthesias (Unpleasant sensation to touch)","Hyperalgesia (Increased sensitivity to pain)","Hyperesthesia (Increased sensitivity to touch)","Hyperpathia (Painful, exaggerated response to nociceptive stimuli)","Hypoalgesia (Decreased sensitivity to painful stimuli)","Hypoesthesia/Hypesthesia (Reduced sensation to touch)","Paresthesia (Burning sensation)","Paresthesia (Tingling sensation)","WNL","No anomaly detected","Unimpaired"} Palpation: {Blank single:19197::"Tender","Complains of area being tender to palpation","Uncomfortable","Positive","Negative","Increased muscle tone","Trigger Point","Muscular Atrophy","Non-tender","No complaints of tenderness","Hyperthermic","Hypothermic","Euthermic","No palpable anomalies"} Provocative Tests: Lumbar Hyperextension and rotation test: {Blank single:19197::"Positive","Negative","Equivocal","Improved after treatment","Unable to perform","Non-contributory","improved","worsened","no change from prior assessment","evaluation deferred today"} {Blank single:19197::"bilaterally for facet joint pain.","on the right for facet joint pain.","on the left for facet joint pain.","due to pain.","due to fusion restriction.","     "} Patrick's Maneuver: {Blank single:19197::"Positive","Negative","Equivocal","Improved after treatment","Unable to perform","Non-contributory","improved","worsened","no change from prior assessment","evaluation deferred today"} {Blank single:19197::"for bilateral S-I joint pain","for right-sided S-I joint pain","for left-sided S-I joint pain","  "} {Blank single:19197::"and","  "} {Blank single:19197::"for bilateral hip joint pain.","for right hip joint  pain.","for left hip joint pain.","due to pain.","due to fusion restriction.","     "}  Gait & Posture Assessment  Ambulation: {Blank single:19197::"Limited","Patient ambulates using a cane","Patient ambulates using crutches","Patient ambulates using a walker","Patient ambulates using a wheel chair","Patient came in today in a wheel chair","Incapable of ambulation without assistance","Nonfunctional","Dependent, Level II (constant assistance required)","Dependent, Level I (intermittent assistance required)","Dependent, Supervision required","Independent, Level surfaces only","Independent, Level and Non-level surfaces","Unassisted"} Gait: {Blank single:19197::"Age-related, senile gait pattern","Antalgic","Limited. Using  assistive device to ambulate","Very limited, using assistive device to ambulate","Antalgic gait (limping)","Apraxia (Inability to execute a movement, upon request, without loss of motor or sensory function)","Ataxia (Poor voluntary coordination)","Ataxia (Appendicular)","Ataxia (Cerebellar)(Irregular, uncoordinated movement with inability to balance on one leg or perform tandem gait test)","Ataxia (Friedreich's)","Ataxia (Sensory) (Unsteady "stomping" gait with heavy heel strikes. Postural instability worsened by closing eyes.)","Ataxia (Truncal)("Drunken sailor" gait)","Ataxia (Vestibular)","Awkward","Cautious","Charlie-Chaplin (due to tibial torsion)","Choreiform (Irregular, jerky, involuntary movements)(Hyperkinetic)","Circumduction gait (due to hemiplegia)","Clumsy","Compensatory","Dystaxia (Mild degree of ataxia)","Dystonic","Frontal gait (Apraxia)","Hemiataxia (Ataxia limited to one side)","Hemiparetic (Post-stroke)(Ipsilateral arm flexion and tiptoe/lateral foot walk)","High stepping gait (foot drop)","Modified gait pattern (slower gait speed, wider stride width, and longer stance duration) associated with morbid obesity","Paraparetic (Stiffness, extension, adduction and scissoring of both  legs)","Parkinsonian","Psychogenic","Scissor gait (cerebral palsy)","Shuffling gait","Staggering","Steppage","Stiff hip gait (hip ankylosis)","Stumbling","Trendelenburg (unstable hip)","Unaffected","Uneven","Waddling (Hip pathology)","Functionally WNL","Grossly intact","Improved after treatment","Relatively normal for age and body habitus"} Posture: {Blank single:19197::"Antalgic","Difficulty standing up straight, due to pain","Positive Romberg's test (Sensory Ataxia)(Worsening of balance and pointing with eyes closed)","Painful","Recombent","Relaxed","Tense","Difficulty with positional changes","Sway back","Lumbar lordosis","Thoracic kyphosis","Kyphosis-lordosis","Flat back","Forward head","Neutral Spine","Slouching","Drooping","Rigid","Poor","Good","WNL"}   Lower Extremity Exam    Side: Right lower extremity  Side: Left lower extremity  Inspection: {Blank single:19197::"Below knee amputation (BKA)","Above knee amputation (AKA)","Contracture","Atrophy","Dystrophy","No gross anomalies detected","Edema","Pitting edema","Venous stasis edema","Positive color changes","Some redness observed","Increased temperature","Acrocyanosis","Normal skin color, temperature, and hair growth. No peripheral edema or cyanosis","No masses, redness, swelling, or asymmetry. No contractures"}  Inspection: {Blank single:19197::"Below knee amputation (BKA)","Above knee amputation (AKA)","Contracture","Atrophy","Dystrophy","No gross anomalies detected","Edema","Pitting edema","Venous stasis edema","Positive color changes","Some redness observed","Increased temperature","Acrocyanosis","Normal skin color, temperature, and hair growth. No peripheral edema or cyanosis","No masses, redness, swelling, or asymmetry. No contractures"}  Functional ROM: {Blank single:19197::"Improved after treatment","Impaired ROM","Adequate ROM","Decreased ROM","Diminished ROM","Full ROM","Fused","Grossly intact ROM","Guarding","Limited ROM","Mechanically  restricted ROM","Minimal ROM","Pain restricted ROM","Restricted ROM","Zero ROM","ROM appears unrestricted","ROM is within functional limits (WFL)","ROM is within normal limits (WNL)","Unrestricted ROM"} {Blank single:19197::"for hip joint","for knee joint","for hip and knee joints","for all joints of the lower extremity","       "}  Functional ROM: {Blank single:19197::"Improved after treatment","Impaired ROM","Adequate ROM","Decreased ROM","Diminished ROM","Full ROM","Fused","Grossly intact ROM","Guarding","Limited ROM","Mechanically restricted ROM","Minimal ROM","Pain restricted ROM","Restricted ROM","Zero ROM","ROM appears unrestricted","ROM is within functional limits (WFL)","ROM is within normal limits (WNL)","Unrestricted ROM"} {Blank single:19197::"for hip joint","for knee joint","for hip and knee joints","for all joints of the lower extremity","       "}  Muscle strength & Tone: {Blank single:19197::"Able to Toe-walk & Heel-walk without problems","Give-away weakness","Deconditioned","Mild-to-moderate deconditioning","Moderate-to-severe deconditioning","Guarding","Inconsistent level of performance when tested","Normal strength (5/5)","Movement possible against some resistance (4/5)","Movement possible against gravity, but not against resistance (3/5)","Movement possible, but not against gravity (1/1)","AFBXUX flickering, but no movement (1/5)","No motor contraction (0/5)","Flaccid paralysis","Spastic paralysis","Cogwheel rigidity","Clasp-knife rigidity","WNL","Unremarkable","Grossly normal","Grossly intact","Functionally intact"}  Muscle strength & Tone: {Blank single:19197::"Able to Toe-walk & Heel-walk without problems","Give-away weakness","Deconditioned","Mild-to-moderate deconditioning","Moderate-to-severe deconditioning","Guarding","Inconsistent level of performance when tested","Normal strength (5/5)","Movement possible against some resistance (4/5)","Movement possible against gravity, but not against  resistance (3/5)","Movement possible, but not against gravity (8/3)","FXOVAN flickering, but no movement (1/5)","No motor contraction (0/5)","Flaccid paralysis","Spastic paralysis","Cogwheel rigidity","Clasp-knife rigidity","WNL","Unremarkable","Grossly normal","Grossly intact","Functionally intact"}  Sensory: {Blank single:19197::"Improved","Movement-associated pain","Movement-associated discomfort","Impaired sensorium","Articular pain pattern","Arthropathic arthralgia","Dermatomal pain pattern","Non-dermatomal pain pattern","Musculoskeletal pain pattern","Myotome pain pattern","Neurogenic pain pattern","Neuropathic pain pattern","Referred pain pattern","Visceral pain pattern","Allodynia (Painful response to non-painful stimuli)","Anesthesia (Absence of sensation)","Anesthesia Dolorosa (Numbness over painful area)","Dysesthesias (Unpleasant sensation to touch)","Hyperalgesia (Increased sensitivity to pain)","Hyperesthesia (Increased sensitivity to touch)","Hyperpathia (Painful, exaggerated response to nociceptive stimuli)","Hypoalgesia (Decreased sensitivity to painful stimuli)","Hypoesthesia/Hypesthesia (Reduced sensation to touch)","Paresthesia (Burning sensation)","Paresthesia (Tingling sensation)","WNL","No anomaly detected","Unimpaired"}  Sensory: {Blank single:19197::"Improved","Movement-associated pain","Movement-associated discomfort","Impaired sensorium","Articular pain pattern","Arthropathic arthralgia","Dermatomal pain pattern","Non-dermatomal pain pattern","Musculoskeletal pain pattern","Myotome pain pattern","Neurogenic pain pattern","Neuropathic pain pattern","Referred pain pattern","Visceral pain pattern","Allodynia (Painful  response to non-painful stimuli)","Anesthesia (Absence of sensation)","Anesthesia Dolorosa (Numbness over painful area)","Dysesthesias (Unpleasant sensation to touch)","Hyperalgesia (Increased sensitivity to pain)","Hyperesthesia (Increased sensitivity to touch)","Hyperpathia  (Painful, exaggerated response to nociceptive stimuli)","Hypoalgesia (Decreased sensitivity to painful stimuli)","Hypoesthesia/Hypesthesia (Reduced sensation to touch)","Paresthesia (Burning sensation)","Paresthesia (Tingling sensation)","WNL","No anomaly detected","Unimpaired"}  Palpation: {Blank single:19197::"Tender","Complains of area being tender to palpation","Uncomfortable","Positive","Negative","Increased muscle tone","Trigger Point","Muscular Atrophy","Non-tender","No complaints of tenderness","Hyperthermic","Hypothermic","Euthermic","No palpable anomalies"}  Palpation: {Blank single:19197::"Tender","Complains of area being tender to palpation","Uncomfortable","Positive","Negative","Increased muscle tone","Trigger Point","Muscular Atrophy","Non-tender","No complaints of tenderness","Hyperthermic","Hypothermic","Euthermic","No palpable anomalies"}   Assessment  Primary Diagnosis & Pertinent Problem List: There were no encounter diagnoses.  Status Diagnosis  {Problem Stability:19197::"Improving","Improved","Not improving","Not responding","Persistent","Recurring","Reoccurring","Responding","Resolved","Stable","Unimproved","Worsening","Controlled"} {Problem Stability:19197::"Improving","Improved","Not improving","Not responding","Persistent","Recurring","Reoccurring","Responding","Resolved","Stable","Unimproved","Worsening","Controlled"} {Problem Stability:19197::"Improving","Improved","Not improving","Not responding","Persistent","Recurring","Reoccurring","Responding","Resolved","Stable","Unimproved","Worsening","Controlled"} No diagnosis found.   Plan of Care  Pharmacotherapy (Medications Ordered): No orders of the defined types were placed in this encounter.  New Prescriptions   No medications on file   Medications administered today: Mr. Rumpf had no medications administered during this visit. Lab-work, procedure(s), and/or referral(s): No orders of the defined types were placed in  this encounter.  Imaging and/or referral(s): None  Interventional therapies: Planned, scheduled, and/or pending:   {Blank single:19197::"Not indicated","Medically contraindicated","On anticoagulants","To be determined at a later time","Not at this time."}   Considering:   ***   Palliative PRN treatment(s):   ***   Provider-requested follow-up: No Follow-up on file.  Future Appointments Date Time Provider [place]  01/20/2017 8:40 AM Sherren Mocha, MD [facility]   Primary Care Physician: Raina Mina, MD Location: [facility] Note by: Vevelyn Francois NP Date: 11/17/2016; Time: 9:46 AM  Pain Score Disclaimer: We use the NRS-11 scale. This is a self-reported, subjective measurement of pain severity with only modest accuracy. It is used primarily to identify changes within a particular patient. It must be understood that outpatient pain scales are significantly less accurate that those used for research, where they can be applied under ideal controlled circumstances with minimal exposure to variables. In reality, the score is likely to be a combination of pain intensity and pain affect, where pain affect describes the degree of emotional arousal or changes in action readiness caused by the sensory experience of pain. Factors such as social and work situation, setting, emotional state, anxiety levels, expectation, and prior pain experience may influence pain perception and show large inter-individual differences that may also be affected by time variables.  Patient instructions provided during this appointment: There are no Patient Instructions on file for this visit.

## 2016-11-23 LAB — TOXASSURE SELECT 13 (MW), URINE

## 2016-11-26 ENCOUNTER — Encounter: Payer: Self-pay | Admitting: Cardiovascular Disease

## 2016-11-26 ENCOUNTER — Ambulatory Visit (INDEPENDENT_AMBULATORY_CARE_PROVIDER_SITE_OTHER): Payer: Medicare Other | Admitting: Cardiovascular Disease

## 2016-11-26 VITALS — BP 126/76 | HR 56 | Ht 68.0 in | Wt 204.6 lb

## 2016-11-26 DIAGNOSIS — I10 Essential (primary) hypertension: Secondary | ICD-10-CM

## 2016-11-26 DIAGNOSIS — I25119 Atherosclerotic heart disease of native coronary artery with unspecified angina pectoris: Secondary | ICD-10-CM | POA: Diagnosis not present

## 2016-11-26 DIAGNOSIS — E782 Mixed hyperlipidemia: Secondary | ICD-10-CM | POA: Diagnosis not present

## 2016-11-26 DIAGNOSIS — I25810 Atherosclerosis of coronary artery bypass graft(s) without angina pectoris: Secondary | ICD-10-CM

## 2016-11-26 NOTE — Patient Instructions (Addendum)
Medication Instructions:  Your physician recommends that you continue on your current medications as directed. Please refer to the Current Medication list given to you today.  Labwork: No new orders.   Testing/Procedures: No new orders.   Follow-Up: Your physician wants you to follow-up in: 6 MONTHS with Dr Excell Seltzer.  You will receive a reminder letter in the mail two months in advance. If you don't receive a letter, please call our office to schedule the follow-up appointment.   Any Other Special Instructions Will Be Listed Below (If Applicable).  If you need a refill on your cardiac medications before your next appointment, please call your pharmacy.    For your  leg edema you  should do  the following 1. Leg elevation  2. Salt restriction  -  Use potassium chloride instead of regular salt as a salt substitute. 3. Walk regularly 4. Weight loss     Go to [email or web address]

## 2016-11-26 NOTE — Progress Notes (Signed)
Cardiology Office Note Date:  11/27/2016   ID:  [name] [name], DOB September 18, 1950, MRN [id number]  PCP:  Feliciana Rossetti, MD  Cardiologist:  [name], MD    Chief Complaint  Patient presents with  . Follow-up     History of Present Illness: [name] [name] is a 66 y.o. male who presents for follow-up of CAD. 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 At follow-up he has had continued patency of his mammary artery and occlusion of all vein grafts. He had a severe anastomotic lesion at the LIMA to LAD insertion site that was treated with stenting several years ago. Follow-up cath demonstrated continued stent patency at the LIMA to LAD insertion.  The patient is here alone today. He's doing fairly well. He hasn't been exercising regularly. He doesn't have any exertional symptoms. No chest pain or shortness of breath. He does complain of mild leg swelling. He notes an indentation from his sock line. No orthopnea, PND, or heart palpitations. He hasn't been watching his salt intake carefully.   Past Medical History:  Diagnosis Date  . CAD (coronary artery disease)    CABG  . Chronic pain syndrome   . GERD (gastroesophageal reflux disease)   . Hyperlipidemia   . Hypertension   . Radiculitis of right cervical region 09/04/2015    Past Surgical History:  Procedure Laterality Date  . CHOLECYSTECTOMY    . CORONARY ARTERY BYPASS GRAFT     multivessel  . HERNIA REPAIR    . LEFT HEART CATHETERIZATION WITH CORONARY/GRAFT ANGIOGRAM N/A 04/21/2013   Procedure: LEFT HEART CATHETERIZATION WITH [name];  Surgeon: Micheline Chapman, MD;  Location: [facility] CATH LAB;  Service: Cardiovascular;  Laterality: N/A;  . PCI 2009 with stenting of LIMA-LAD anastamosis      Current Outpatient Prescriptions  Medication Sig Dispense Refill  . aspirin (GOODSENSE ASPIRIN) 325 MG tablet Take 325 mg by mouth daily.     Marland Kitchen atorvastatin (LIPITOR) 20 MG tablet Take 1 tablet  (20 mg total) by mouth daily. 90 tablet 2  . Cholecalciferol (VITAMIN D3) 2000 units capsule Take 1 capsule (2,000 Units total) by mouth daily. 30 capsule PRN  . clopidogrel (PLAVIX) 75 MG tablet Take 1 tablet (75 mg total) by mouth daily. 90 tablet 2  . gabapentin (NEURONTIN) 300 MG capsule Take 1-3 capsules (300-900 mg total) by mouth 4 (four) times daily. 360 capsule 2  . hydrochlorothiazide (HYDRODIURIL) 25 MG tablet Take 1 tablet (25 mg total) by mouth daily. 90 tablet 2  . isosorbide mononitrate (IMDUR) 30 MG 24 hr tablet Take 1 tablet (30 mg total) by mouth daily. 90 tablet 2  . magnesium oxide (MAG-OX) 400 MG tablet Take 1 tablet (400 mg total) by mouth daily. 90 tablet 0  . metoprolol succinate (TOPROL-XL) 50 MG 24 hr tablet TAKE 1 TABLET BY MOUTH EVERY DAY WITH OR IMMEDIATELY FOLLOWING A MEAL 90 tablet 2  . nitroGLYCERIN (NITROSTAT) 0.4 MG SL tablet Place 1 tablet (0.4 mg total) under the tongue every 5 (five) minutes as needed for chest pain. 25 tablet 1  . Oxycodone HCl 10 MG TABS Take 10 mg by mouth 5 (five) times daily as needed (pain).    . pantoprazole (PROTONIX) 40 MG tablet TAKE 1 TABLET BY MOUTH DAILY 90 tablet 3  . potassium chloride SA (K-DUR,KLOR-CON) 20 MEQ tablet Take 20 mEq by mouth daily.     No current facility-administered medications for this visit.  Allergies:   Patient has no known allergies.   Social History:  The patient  reports that he has quit smoking. He has never used smokeless tobacco. He reports that he does not drink alcohol or use drugs.   Family History:  The patient's  family history includes Multiple sclerosis in his mother; Stroke in his father.    ROS:  Please see the history of present illness.  Otherwise, review of systems is positive for Back pain, leg pain, leg swelling.  All other systems are reviewed and negative.   PHYSICAL EXAM: VS:  BP 126/76   Pulse (!) 56   Ht 5\' 8"  (1.727 m)   Wt 204 lb 9.6 oz (92.8 kg)   BMI 31.11 kg/m  ,  BMI Body mass index is 31.11 kg/m. GEN: Well nourished, well developed, in no acute distress  HEENT: normal  Neck: no JVD, no masses. No carotid bruits Cardiac: RRR without murmur or gallop                Respiratory:  clear to auscultation bilaterally, normal work of breathing GI: soft, nontender, nondistended, + BS MS: no deformity or atrophy  Ext: trace bilateral pretibial edema, pedal pulses 2+= bilaterally Skin: warm and dry, no rash Neuro:  Strength and sensation are intact Psych: euthymic mood, full affect  EKG:  EKG is ordered today. The ekg ordered today shows sinus bradycardia 56 bpm, right bundle branch block. No significant change from previous.  Recent Labs: 03/12/2016: Magnesium 1.9; Platelets 134 06/18/2016: ALT 28; BUN 9; Creat 0.85; Potassium 3.9; Sodium 139   Lipid Panel     Component Value Date/Time   CHOL 160 06/18/2016 0954   TRIG 93 06/18/2016 0954   HDL 42 06/18/2016 0954   CHOLHDL 3.8 06/18/2016 0954   VLDL 19 06/18/2016 0954   LDLCALC 99 06/18/2016 0954   LDLDIRECT 92.7 07/04/2009 0946      Wt Readings from Last 3 Encounters:  11/26/16 204 lb 9.6 oz (92.8 kg)  11/17/16 198 lb (89.8 kg)  09/01/16 200 lb (90.7 kg)    ASSESSMENT AND PLAN: 1.  Coronary artery disease, native vessel, without angina: Patient continues on long-term dual antiplatelet therapy with aspirin and Plavix. His medical program is reviewed and no changes are recommended.  2. Essential hypertension: Blood pressure is well controlled on his current medical program which includes isosorbide, hydrochlorothiazide, and metoprolol succinate.  3. Hyperlipidemia: Continue atorvastatin 20 mg. The patient has been sedentary. We had a lengthy discussion about the importance of lifestyle modification. We discussed specifics around an exercise program and dietary changes.  4. Edema: Appears benign. We discussed specifics of leg elevation. Will try to better avoid sodium. He understands that  with increased walking and exercise this will likely improve.  Current medicines are reviewed with the patient today.  The patient does not have concerns regarding medicines.  Labs/ tests ordered today include:   Orders Placed This Encounter  Procedures  . EKG 12-Lead    Disposition:   FU 6 months  Signed, [name], MD  11/27/2016 1:01 PM    [facility] [address] [employer], [place], [place]  [address] Phone: [phone]; Fax: [phone]

## 2017-01-20 ENCOUNTER — Ambulatory Visit: Payer: Medicare Other | Admitting: Cardiovascular Disease

## 2017-02-15 ENCOUNTER — Ambulatory Visit: Payer: Medicare Other | Attending: Nurse Practitioner | Admitting: Nurse Practitioner

## 2017-02-15 ENCOUNTER — Encounter: Payer: Self-pay | Admitting: Nurse Practitioner

## 2017-02-15 VITALS — BP 127/75 | HR 73 | Temp 98.2°F | Resp 16 | Ht 68.0 in | Wt 200.0 lb

## 2017-02-15 DIAGNOSIS — M542 Cervicalgia: Secondary | ICD-10-CM | POA: Diagnosis not present

## 2017-02-15 DIAGNOSIS — E860 Dehydration: Secondary | ICD-10-CM | POA: Insufficient documentation

## 2017-02-15 DIAGNOSIS — D649 Anemia, unspecified: Secondary | ICD-10-CM | POA: Diagnosis not present

## 2017-02-15 DIAGNOSIS — Z951 Presence of aortocoronary bypass graft: Secondary | ICD-10-CM | POA: Insufficient documentation

## 2017-02-15 DIAGNOSIS — S20219A Contusion of unspecified front wall of thorax, initial encounter: Secondary | ICD-10-CM | POA: Diagnosis not present

## 2017-02-15 DIAGNOSIS — M5412 Radiculopathy, cervical region: Secondary | ICD-10-CM | POA: Insufficient documentation

## 2017-02-15 DIAGNOSIS — M5137 Other intervertebral disc degeneration, lumbosacral region: Secondary | ICD-10-CM | POA: Diagnosis not present

## 2017-02-15 DIAGNOSIS — E669 Obesity, unspecified: Secondary | ICD-10-CM | POA: Insufficient documentation

## 2017-02-15 DIAGNOSIS — Z7902 Long term (current) use of antithrombotics/antiplatelets: Secondary | ICD-10-CM | POA: Insufficient documentation

## 2017-02-15 DIAGNOSIS — J309 Allergic rhinitis, unspecified: Secondary | ICD-10-CM | POA: Diagnosis not present

## 2017-02-15 DIAGNOSIS — M792 Neuralgia and neuritis, unspecified: Secondary | ICD-10-CM | POA: Diagnosis not present

## 2017-02-15 DIAGNOSIS — G8929 Other chronic pain: Secondary | ICD-10-CM | POA: Diagnosis not present

## 2017-02-15 DIAGNOSIS — R7303 Prediabetes: Secondary | ICD-10-CM | POA: Insufficient documentation

## 2017-02-15 DIAGNOSIS — E876 Hypokalemia: Secondary | ICD-10-CM | POA: Diagnosis not present

## 2017-02-15 DIAGNOSIS — Z87891 Personal history of nicotine dependence: Secondary | ICD-10-CM | POA: Insufficient documentation

## 2017-02-15 DIAGNOSIS — I219 Acute myocardial infarction, unspecified: Secondary | ICD-10-CM | POA: Insufficient documentation

## 2017-02-15 DIAGNOSIS — I951 Orthostatic hypotension: Secondary | ICD-10-CM | POA: Insufficient documentation

## 2017-02-15 DIAGNOSIS — K219 Gastro-esophageal reflux disease without esophagitis: Secondary | ICD-10-CM

## 2017-02-15 DIAGNOSIS — Z7901 Long term (current) use of anticoagulants: Secondary | ICD-10-CM | POA: Diagnosis not present

## 2017-02-15 DIAGNOSIS — I2510 Atherosclerotic heart disease of native coronary artery without angina pectoris: Secondary | ICD-10-CM | POA: Diagnosis not present

## 2017-02-15 DIAGNOSIS — E7800 Pure hypercholesterolemia, unspecified: Secondary | ICD-10-CM | POA: Insufficient documentation

## 2017-02-15 DIAGNOSIS — Y92410 Unspecified street and highway as the place of occurrence of the external cause: Secondary | ICD-10-CM | POA: Insufficient documentation

## 2017-02-15 DIAGNOSIS — E559 Vitamin D deficiency, unspecified: Secondary | ICD-10-CM | POA: Diagnosis not present

## 2017-02-15 DIAGNOSIS — Z79891 Long term (current) use of opiate analgesic: Secondary | ICD-10-CM | POA: Insufficient documentation

## 2017-02-15 DIAGNOSIS — N281 Cyst of kidney, acquired: Secondary | ICD-10-CM | POA: Insufficient documentation

## 2017-02-15 DIAGNOSIS — I10 Essential (primary) hypertension: Secondary | ICD-10-CM | POA: Insufficient documentation

## 2017-02-15 DIAGNOSIS — M79605 Pain in left leg: Secondary | ICD-10-CM

## 2017-02-15 DIAGNOSIS — M109 Gout, unspecified: Secondary | ICD-10-CM | POA: Insufficient documentation

## 2017-02-15 DIAGNOSIS — M5481 Occipital neuralgia: Secondary | ICD-10-CM | POA: Insufficient documentation

## 2017-02-15 DIAGNOSIS — Z79899 Other long term (current) drug therapy: Secondary | ICD-10-CM | POA: Insufficient documentation

## 2017-02-15 DIAGNOSIS — Z7982 Long term (current) use of aspirin: Secondary | ICD-10-CM | POA: Insufficient documentation

## 2017-02-15 DIAGNOSIS — M79602 Pain in left arm: Secondary | ICD-10-CM | POA: Diagnosis not present

## 2017-02-15 DIAGNOSIS — G894 Chronic pain syndrome: Secondary | ICD-10-CM | POA: Diagnosis present

## 2017-02-15 DIAGNOSIS — R079 Chest pain, unspecified: Secondary | ICD-10-CM | POA: Insufficient documentation

## 2017-02-15 MED ORDER — OXYCODONE HCL 10 MG PO TABS: 10.0000 mg | ORAL_TABLET | Freq: Every day | ORAL | 0 refills | Status: DC | PRN

## 2017-02-15 MED ORDER — VITAMIN D3 50 MCG (2000 UT) PO CAPS: ORAL_CAPSULE | ORAL | 99 refills | Status: DC

## 2017-02-15 MED ORDER — GABAPENTIN 300 MG PO CAPS: 300.0000 mg | ORAL_CAPSULE | Freq: Four times a day (QID) | ORAL | 2 refills | Status: DC

## 2017-02-15 MED ORDER — MAGNESIUM OXIDE 400 MG PO TABS: 400.0000 mg | ORAL_TABLET | Freq: Every day | ORAL | 0 refills | Status: DC

## 2017-02-15 NOTE — Progress Notes (Signed)
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 Prescriptions   No medications on file   Medications administered today: Mr. Kozakiewicz had no medications administered during this visit. Lab-work, procedure(s), and/or referral(s): No orders of the defined types were placed in this encounter.  Imaging and/or referral(s): None  Interventional therapies: Planned, scheduled, and/or pending:   Patient planning to have cervical spine surgery.    Considering:   Not at this time.   Palliative PRN treatment(s):   Not at this time     Provider-requested follow-up: Return in 4 months (on 06/02/2017) for MedMgmt.  Future Appointments Date Time Provider Francis  05/17/2017 9:45 AM Vevelyn Francois, NP [facility] None   Primary Care Physician: Raina Mina., MD Location: [facility] Note by: Vevelyn Francois NP Date: 02/15/2017; Time: 11:14 AM  Pain Score  Disclaimer: We use the NRS-11 scale. This is a self-reported, subjective measurement of pain severity with only modest accuracy. It is used primarily to identify changes within a particular patient. It must be understood that outpatient pain scales are significantly less accurate that those used for research, where they can be applied under ideal controlled circumstances with minimal exposure to variables. In reality, the score is likely to be a combination of pain intensity and pain affect, where pain affect describes the degree of emotional arousal or changes in action readiness caused by the sensory experience of pain. Factors such as social and work situation, setting, emotional state, anxiety levels, expectation, and prior pain experience may influence pain perception and show large inter-individual differences that may also be affected by time variables.  Patient instructions provided during this appointment: Patient Instructions   ____________________________________________________________________________________________  Medication Rules  Applies to: All patients receiving prescriptions (written or electronic).  Pharmacy of record: Pharmacy where electronic prescriptions will be sent. If written prescriptions are taken to a different pharmacy, please inform the nursing staff. The pharmacy listed in the electronic medical record should be the one where you would like electronic prescriptions to be sent.  Prescription refills: Only during scheduled appointments. Applies to both, written and electronic prescriptions.  NOTE: The following applies primarily to controlled substances (Opioid* Pain Medications).   Patient's responsibilities: 1. Pain Pills: Bring all pain pills to every appointment (except for procedure appointments). 2. Pill Bottles: Bring pills in original pharmacy bottle. Always bring newest bottle. Bring bottle, even if empty. 3. Medication refills: You are responsible for  knowing and keeping track of what medications you need refilled. The day before your appointment, write a list of all prescriptions that need to be refilled. Bring that list to your appointment and give it to the admitting nurse. Prescriptions will be written only during appointments. If you forget a medication, it will not be "Called in", "Faxed", or "electronically sent". You will need to get another appointment to get these prescribed. 4. Prescription Accuracy: You are responsible for carefully inspecting your prescriptions before leaving our office. Have the discharge nurse carefully go over each prescription with you, before taking them home. Make sure that your name is accurately spelled, that your address is correct. Check the name and dose of your medication to make sure it is accurate. Check the number of pills, and the written instructions to make sure they are clear and accurate. Make sure that you are given enough medication to last until your next medication refill appointment. 5. Taking Medication: Take medication as prescribed. Never take more pills than instructed. Never take medication more frequently than prescribed. Taking less pills or less frequently is permitted and encouraged, when it comes to controlled substances (written prescriptions).  6. Inform other Doctors: Always inform, all of your healthcare providers, of all the medications you take. 7. Pain Medication from other Providers: You are not allowed to accept any additional pain medication from any other Doctor or Healthcare provider. There are two exceptions to this rule. (see below) In the event that you require additional pain medication, you are responsible for notifying us, as stated below. 8. Medication Agreement: You are responsible for carefully reading and following our Medication Agreement. This must be signed before receiving any prescriptions from our practice. Safely store a copy of your signed Agreement. Violations to  the Agreement will result in no further prescriptions. (Additional copies of our Medication Agreement are available upon request.) 9. Laws, Rules, & Regulations: All patients are expected to follow all Federal and [employer], Rules, [employer]. Ignorance of the Laws does not constitute a valid excuse. The use of any illegal substances is prohibited. 10. Adopted CDC guidelines & recommendations: Target dosing levels will be at or below 60 MME/day. Use of benzodiazepines** is not recommended.  Exceptions: There are only two exceptions to the rule of not receiving pain medications from other Healthcare Providers. 1. Exception #1 (Emergencies): In the event of an emergency (i.e.: accident requiring emergency care), you are allowed to receive additional pain medication. However, you are responsible for: As soon as you are able, call our office [phone], at any time of the day or night, and leave a message stating your name, the date and nature of the emergency, and the name and dose of the medication prescribed. In  the event that your call is answered by a member of our staff, make sure to document and save the date, time, and the name of the person that took your information.  2. Exception #2 (Planned Surgery): In the event that you are scheduled by another doctor or dentist to have any type of surgery or procedure, you are allowed (for a period no longer than 30 days), to receive additional pain medication, for the acute post-op pain. However, in this case, you are responsible for picking up a copy of our "Post-op Pain Management for Surgeons" handout, and giving it to your surgeon or dentist. This document is available at our office, and does not require an appointment to obtain it. Simply go to our office during business hours (Monday-Thursday from 8:00 AM to 4:00 PM) (Friday 8:00 AM to 12:00 Noon) or if you have a scheduled appointment with [place], prior to your surgery, and ask for it by name.  In addition, you will need to provide [place] with your name, name of your surgeon, type of surgery, and date of procedure or surgery.  *Opioid medications include: morphine, codeine, oxycodone, oxymorphone, hydrocodone, hydromorphone, meperidine, tramadol, tapentadol, buprenorphine, fentanyl, methadone. **Benzodiazepine medications include: diazepam (Valium), alprazolam (Xanax), clonazepam (Klonopine), lorazepam (Ativan), clorazepate (Tranxene), chlordiazepoxide (Librium), estazolam (Prosom), oxazepam (Serax), temazepam (Restoril), triazolam (Halcion)  ____________________________________________________________________________________________

## 2017-02-15 NOTE — Progress Notes (Signed)
Nursing Pain Medication Assessment:  Safety precautions to be maintained throughout the outpatient stay will include: orient to surroundings, keep bed in low position, maintain call bell within reach at all times, provide assistance with transfer out of bed and ambulation.  Medication Inspection Compliance: Pill count conducted under aseptic conditions, in front of the patient. Neither the pills nor the bottle was removed from the patient's sight at any time. Once count was completed pills were immediately returned to the patient in their original bottle.  Medication: Oxycodone IR Pill/Patch Count: 25 of 150 pills remain Pill/Patch Appearance: Markings consistent with prescribed medication Bottle Appearance: Standard pharmacy container. Clearly labeled. Filled Date: 06 / 17 / 2018 Last Medication intake:  Today

## 2017-02-15 NOTE — Patient Instructions (Signed)

## 2017-02-18 DIAGNOSIS — Z9289 Personal history of other medical treatment: Secondary | ICD-10-CM | POA: Insufficient documentation

## 2017-05-17 ENCOUNTER — Encounter: Payer: Self-pay | Admitting: Nurse Practitioner

## 2017-05-17 ENCOUNTER — Ambulatory Visit: Payer: Medicare Other | Attending: Nurse Practitioner | Admitting: Nurse Practitioner

## 2017-05-17 VITALS — BP 157/79 | HR 73 | Temp 98.1°F | Resp 18 | Ht 68.0 in | Wt 200.0 lb

## 2017-05-17 DIAGNOSIS — Z9049 Acquired absence of other specified parts of digestive tract: Secondary | ICD-10-CM | POA: Diagnosis not present

## 2017-05-17 DIAGNOSIS — Z9889 Other specified postprocedural states: Secondary | ICD-10-CM | POA: Insufficient documentation

## 2017-05-17 DIAGNOSIS — R7303 Prediabetes: Secondary | ICD-10-CM | POA: Insufficient documentation

## 2017-05-17 DIAGNOSIS — M5137 Other intervertebral disc degeneration, lumbosacral region: Secondary | ICD-10-CM | POA: Insufficient documentation

## 2017-05-17 DIAGNOSIS — Z820 Family history of epilepsy and other diseases of the nervous system: Secondary | ICD-10-CM | POA: Diagnosis not present

## 2017-05-17 DIAGNOSIS — I2510 Atherosclerotic heart disease of native coronary artery without angina pectoris: Secondary | ICD-10-CM | POA: Insufficient documentation

## 2017-05-17 DIAGNOSIS — S79911A Unspecified injury of right hip, initial encounter: Secondary | ICD-10-CM | POA: Insufficient documentation

## 2017-05-17 DIAGNOSIS — E669 Obesity, unspecified: Secondary | ICD-10-CM | POA: Insufficient documentation

## 2017-05-17 DIAGNOSIS — Z87891 Personal history of nicotine dependence: Secondary | ICD-10-CM | POA: Diagnosis not present

## 2017-05-17 DIAGNOSIS — Z79891 Long term (current) use of opiate analgesic: Secondary | ICD-10-CM | POA: Diagnosis not present

## 2017-05-17 DIAGNOSIS — M47896 Other spondylosis, lumbar region: Secondary | ICD-10-CM | POA: Insufficient documentation

## 2017-05-17 DIAGNOSIS — Z951 Presence of aortocoronary bypass graft: Secondary | ICD-10-CM | POA: Insufficient documentation

## 2017-05-17 DIAGNOSIS — R079 Chest pain, unspecified: Secondary | ICD-10-CM | POA: Diagnosis not present

## 2017-05-17 DIAGNOSIS — I10 Essential (primary) hypertension: Secondary | ICD-10-CM | POA: Diagnosis not present

## 2017-05-17 DIAGNOSIS — Z823 Family history of stroke: Secondary | ICD-10-CM | POA: Diagnosis not present

## 2017-05-17 DIAGNOSIS — Z79899 Other long term (current) drug therapy: Secondary | ICD-10-CM | POA: Diagnosis not present

## 2017-05-17 DIAGNOSIS — G8929 Other chronic pain: Secondary | ICD-10-CM

## 2017-05-17 DIAGNOSIS — M5416 Radiculopathy, lumbar region: Secondary | ICD-10-CM

## 2017-05-17 DIAGNOSIS — M792 Neuralgia and neuritis, unspecified: Secondary | ICD-10-CM | POA: Diagnosis not present

## 2017-05-17 DIAGNOSIS — M47816 Spondylosis without myelopathy or radiculopathy, lumbar region: Secondary | ICD-10-CM | POA: Diagnosis not present

## 2017-05-17 DIAGNOSIS — K219 Gastro-esophageal reflux disease without esophagitis: Secondary | ICD-10-CM | POA: Diagnosis not present

## 2017-05-17 DIAGNOSIS — Z7982 Long term (current) use of aspirin: Secondary | ICD-10-CM | POA: Insufficient documentation

## 2017-05-17 DIAGNOSIS — M79605 Pain in left leg: Secondary | ICD-10-CM | POA: Diagnosis not present

## 2017-05-17 DIAGNOSIS — M5412 Radiculopathy, cervical region: Secondary | ICD-10-CM

## 2017-05-17 DIAGNOSIS — E876 Hypokalemia: Secondary | ICD-10-CM | POA: Insufficient documentation

## 2017-05-17 DIAGNOSIS — G894 Chronic pain syndrome: Secondary | ICD-10-CM | POA: Diagnosis not present

## 2017-05-17 DIAGNOSIS — I951 Orthostatic hypotension: Secondary | ICD-10-CM | POA: Diagnosis not present

## 2017-05-17 DIAGNOSIS — Z7902 Long term (current) use of antithrombotics/antiplatelets: Secondary | ICD-10-CM | POA: Insufficient documentation

## 2017-05-17 DIAGNOSIS — E7800 Pure hypercholesterolemia, unspecified: Secondary | ICD-10-CM | POA: Diagnosis not present

## 2017-05-17 MED ORDER — OXYCODONE HCL 10 MG PO TABS: 10.0000 mg | ORAL_TABLET | Freq: Every day | ORAL | 0 refills | Status: DC | PRN

## 2017-05-17 MED ORDER — MAGNESIUM OXIDE 400 MG PO TABS: 400.0000 mg | ORAL_TABLET | Freq: Every day | ORAL | 0 refills | Status: DC

## 2017-05-17 NOTE — Progress Notes (Signed)
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 primarily to controlled substances (Opioid* Pain Medications).   Patient's responsibilities: 1. Pain Pills: Bring all pain pills to every appointment (except for procedure appointments). 2. Pill Bottles: Bring pills in original pharmacy bottle. Always bring newest bottle. Bring bottle, even if empty. 3. Medication refills: You are responsible for knowing and keeping track of what medications you need refilled. The day before your appointment, write a list of all prescriptions that need to be refilled. Bring that list to your appointment and give it to the admitting nurse. Prescriptions will be written only during appointments. If you forget a medication, it will not be "Called in", "Faxed", or "electronically sent". You will need to get another appointment to get these prescribed. 4. Prescription Accuracy: You are responsible for carefully inspecting your prescriptions before leaving our office.  Have the discharge nurse carefully go over each prescription with you, before taking them home. Make sure that your name is accurately spelled, that your address is correct. Check the name and dose of your medication to make sure it is accurate. Check the number of pills, and the written instructions to make sure they are clear and accurate. Make sure that you are given enough medication to last until your next medication refill appointment. 5. Taking Medication: Take medication as prescribed. Never take more pills than instructed. Never take medication more frequently than prescribed. Taking less pills or less frequently is permitted and encouraged, when it comes to controlled substances (written prescriptions).  6. Inform other Doctors: Always inform, all of your healthcare providers, of all the medications you take. 7. Pain Medication from other Providers: You are not allowed to accept any additional pain medication from any other Doctor or Healthcare provider. There are two exceptions to this rule. (see below) In the event that you require additional pain medication, you are responsible for notifying us, as stated below. 8. Medication Agreement: You are responsible for carefully reading and following our Medication Agreement. This must be signed before receiving any prescriptions from our practice. Safely store a copy of your signed Agreement. Violations to the Agreement will result in no further prescriptions. (Additional copies of our Medication Agreement are available upon request.) 9. Laws, Rules, & Regulations: All patients are expected to follow all Federal and [employer], Rules, [employer]. Ignorance of the Laws does not constitute a valid excuse. The use of any illegal substances is prohibited. 10. Adopted CDC guidelines & recommendations: Target dosing levels will be at or below 60 MME/day. Use of benzodiazepines** is not recommended.  Exceptions: There are only two exceptions to  the rule of not receiving pain medications from other Healthcare Providers. 1. Exception #1 (Emergencies): In the event of an emergency (i.e.: accident requiring emergency care), you are allowed to receive additional pain medication. However, you are responsible for: As soon as you are able, call our office [phone], at any time of the day or night, and leave a message stating your name, the  date and nature of the emergency, and the name and dose of the medication prescribed. In the event that your call is answered by a member of our staff, make sure to document and save the date, time, and the name of the person that took your information.  2. Exception #2 (Planned Surgery): In the event that you are scheduled by another doctor or dentist to have any type of surgery or procedure, you are allowed (for a period no longer than 30 days), to receive additional pain medication, for the acute post-op pain. However, in this case, you are responsible for picking up a copy of our "Post-op Pain Management for Surgeons" handout, and giving it to your surgeon or dentist. This document is available at our office, and does not require an appointment to obtain it. Simply go to our office during business hours (Monday-Thursday from 8:00 AM to 4:00 PM) (Friday 8:00 AM to 12:00 Noon) or if you have a scheduled appointment with [place], prior to your surgery, and ask for it by name. In addition, you will need to provide [place] with your name, name of your surgeon, type of surgery, and date of procedure or surgery.  *Opioid medications include: morphine, codeine, oxycodone, oxymorphone, hydrocodone, hydromorphone, meperidine, tramadol, tapentadol, buprenorphine, fentanyl, methadone. **Benzodiazepine medications include: diazepam (Valium), alprazolam (Xanax), clonazepam (Klonopine), lorazepam (Ativan), clorazepate (Tranxene), chlordiazepoxide (Librium), estazolam (Prosom), oxazepam (Serax), temazepam (Restoril), triazolam  (Halcion)  ____________________________________________________________________________________________  BMI Assessment: Estimated body mass index is 30.41 kg/m as calculated from the following:   Height as of this encounter: '5\' 8"'$  (1.727 m).   Weight as of this encounter: 200 lb (90.7 kg).  BMI interpretation table: BMI level Category Range association with higher incidence of chronic pain  <18 kg/m2 Underweight   18.5-24.9 kg/m2 Ideal body weight   25-29.9 kg/m2 Overweight Increased incidence by 20%  30-34.9 kg/m2 Obese (Class I) Increased incidence by 68%  35-39.9 kg/m2 Severe obesity (Class II) Increased incidence by 136%  >40 kg/m2 Extreme obesity (Class III) Increased incidence by 254%   BMI Readings from Last 4 Encounters:  05/17/17 30.41 kg/m  02/15/17 30.41 kg/m  11/26/16 31.11 kg/m  11/17/16 30.11 kg/m   Wt Readings from Last 4 Encounters:  05/17/17 200 lb (90.7 kg)  02/15/17 200 lb (90.7 kg)  11/26/16 204 lb 9.6 oz (92.8 kg)  11/17/16 198 lb (89.8 kg)   You were given 3 prescriptions for Oxycodone today.

## 2017-05-17 NOTE — Patient Instructions (Addendum)
____________________________________________________________________________________________  Medication Rules  Applies to: All patients receiving prescriptions (written or electronic).  Pharmacy of record: Pharmacy where electronic prescriptions will be sent. If written prescriptions are taken to a different pharmacy, please inform the nursing staff. The pharmacy listed in the electronic medical record should be the one where you would like electronic prescriptions to be sent.  Prescription refills: Only during scheduled appointments. Applies to both, written and electronic prescriptions.  NOTE: The following applies primarily to controlled substances (Opioid* Pain Medications).   Patient's responsibilities: 1. Pain Pills: Bring all pain pills to every appointment (except for procedure appointments). 2. Pill Bottles: Bring pills in original pharmacy bottle. Always bring newest bottle. Bring bottle, even if empty. 3. Medication refills: You are responsible for knowing and keeping track of what medications you need refilled. The day before your appointment, write a list of all prescriptions that need to be refilled. Bring that list to your appointment and give it to the admitting nurse. Prescriptions will be written only during appointments. If you forget a medication, it will not be "Called in", "Faxed", or "electronically sent". You will need to get another appointment to get these prescribed. 4. Prescription Accuracy: You are responsible for carefully inspecting your prescriptions before leaving our office. Have the discharge nurse carefully go over each prescription with you, before taking them home. Make sure that your name is accurately spelled, that your address is correct. Check the name and dose of your medication to make sure it is accurate. Check the number of pills, and the written instructions to make sure they are clear and accurate. Make sure that you are given enough medication to  last until your next medication refill appointment. 5. Taking Medication: Take medication as prescribed. Never take more pills than instructed. Never take medication more frequently than prescribed. Taking less pills or less frequently is permitted and encouraged, when it comes to controlled substances (written prescriptions).  6. Inform other Doctors: Always inform, all of your healthcare providers, of all the medications you take. 7. Pain Medication from other Providers: You are not allowed to accept any additional pain medication from any other Doctor or Healthcare provider. There are two exceptions to this rule. (see below) In the event that you require additional pain medication, you are responsible for notifying us, as stated below. 8. Medication Agreement: You are responsible for carefully reading and following our Medication Agreement. This must be signed before receiving any prescriptions from our practice. Safely store a copy of your signed Agreement. Violations to the Agreement will result in no further prescriptions. (Additional copies of our Medication Agreement are available upon request.) 9. Laws, Rules, & Regulations: All patients are expected to follow all Federal and State Laws, Statutes, Rules, & Regulations. Ignorance of the Laws does not constitute a valid excuse. The use of any illegal substances is prohibited. 10. Adopted CDC guidelines & recommendations: Target dosing levels will be at or below 60 MME/day. Use of benzodiazepines** is not recommended.  Exceptions: There are only two exceptions to the rule of not receiving pain medications from other Healthcare Providers. 1. Exception #1 (Emergencies): In the event of an emergency (i.e.: accident requiring emergency care), you are allowed to receive additional pain medication. However, you are responsible for: As soon as you are able, call our office [phone], at any time of the day or night, and leave a message stating your  name, the date and nature of the emergency, and the name and dose of the medication   prescribed. In the event that your call is answered by a member of our staff, make sure to document and save the date, time, and the name of the person that took your information.  2. Exception #2 (Planned Surgery): In the event that you are scheduled by another doctor or dentist to have any type of surgery or procedure, you are allowed (for a period no longer than 30 days), to receive additional pain medication, for the acute post-op pain. However, in this case, you are responsible for picking up a copy of our "Post-op Pain Management for Surgeons" handout, and giving it to your surgeon or dentist. This document is available at our office, and does not require an appointment to obtain it. Simply go to our office during business hours (Monday-Thursday from 8:00 AM to 4:00 PM) (Friday 8:00 AM to 12:00 Noon) or if you have a scheduled appointment with [place], prior to your surgery, and ask for it by name. In addition, you will need to provide [place] with your name, name of your surgeon, type of surgery, and date of procedure or surgery.  *Opioid medications include: morphine, codeine, oxycodone, oxymorphone, hydrocodone, hydromorphone, meperidine, tramadol, tapentadol, buprenorphine, fentanyl, methadone. **Benzodiazepine medications include: diazepam (Valium), alprazolam (Xanax), clonazepam (Klonopine), lorazepam (Ativan), clorazepate (Tranxene), chlordiazepoxide (Librium), estazolam (Prosom), oxazepam (Serax), temazepam (Restoril), triazolam (Halcion)  ____________________________________________________________________________________________  BMI Assessment: Estimated body mass index is 30.41 kg/m as calculated from the following:   Height as of this encounter: 5\' 8"  (1.727 m).   Weight as of this encounter: 200 lb (90.7 kg).  BMI interpretation table: BMI level Category Range association with higher incidence of chronic pain   <18 kg/m2 Underweight   18.5-24.9 kg/m2 Ideal body weight   25-29.9 kg/m2 Overweight Increased incidence by 20%  30-34.9 kg/m2 Obese (Class I) Increased incidence by 68%  35-39.9 kg/m2 Severe obesity (Class II) Increased incidence by 136%  >40 kg/m2 Extreme obesity (Class III) Increased incidence by 254%   BMI Readings from Last 4 Encounters:  05/17/17 30.41 kg/m  02/15/17 30.41 kg/m  11/26/16 31.11 kg/m  11/17/16 30.11 kg/m   Wt Readings from Last 4 Encounters:  05/17/17 200 lb (90.7 kg)  02/15/17 200 lb (90.7 kg)  11/26/16 204 lb 9.6 oz (92.8 kg)  11/17/16 198 lb (89.8 kg)   You were given 3 prescriptions for Oxycodone today.

## 2017-05-17 NOTE — Progress Notes (Signed)
Nursing Pain Medication Assessment:  Safety precautions to be maintained throughout the outpatient stay will include: orient to surroundings, keep bed in low position, maintain call bell within reach at all times, provide assistance with transfer out of bed and ambulation.  Medication Inspection Compliance: Pill count conducted under aseptic conditions, in front of the patient. Neither the pills nor the bottle was removed from the patient's sight at any time. Once count was completed pills were immediately returned to the patient in their original bottle.  Medication: Morphine IR Pill/Patch Count: 14 of 150 pills remain Pill/Patch Appearance: Markings consistent with prescribed medication Bottle Appearance: Standard pharmacy container. Clearly labeled. Filled Date: 09/16 / 2018 Last Medication intake:  Today

## 2017-06-04 ENCOUNTER — Other Ambulatory Visit: Payer: Self-pay | Admitting: Cardiovascular Disease

## 2017-06-04 DIAGNOSIS — I25810 Atherosclerosis of coronary artery bypass graft(s) without angina pectoris: Secondary | ICD-10-CM

## 2017-06-04 DIAGNOSIS — E785 Hyperlipidemia, unspecified: Secondary | ICD-10-CM

## 2017-06-04 DIAGNOSIS — I10 Essential (primary) hypertension: Secondary | ICD-10-CM

## 2017-07-07 ENCOUNTER — Other Ambulatory Visit: Payer: Self-pay | Admitting: Cardiovascular Disease

## 2017-08-13 ENCOUNTER — Other Ambulatory Visit: Payer: Self-pay | Admitting: Cardiovascular Disease

## 2017-08-16 ENCOUNTER — Encounter (INDEPENDENT_AMBULATORY_CARE_PROVIDER_SITE_OTHER): Payer: Self-pay

## 2017-08-16 ENCOUNTER — Encounter: Payer: Self-pay | Admitting: Cardiovascular Disease

## 2017-08-16 ENCOUNTER — Ambulatory Visit (INDEPENDENT_AMBULATORY_CARE_PROVIDER_SITE_OTHER): Payer: Medicare Other | Admitting: Cardiovascular Disease

## 2017-08-16 VITALS — BP 130/70 | HR 60 | Ht 68.0 in | Wt 195.4 lb

## 2017-08-16 DIAGNOSIS — E785 Hyperlipidemia, unspecified: Secondary | ICD-10-CM | POA: Diagnosis not present

## 2017-08-16 DIAGNOSIS — I10 Essential (primary) hypertension: Secondary | ICD-10-CM

## 2017-08-16 DIAGNOSIS — I2510 Atherosclerotic heart disease of native coronary artery without angina pectoris: Secondary | ICD-10-CM

## 2017-08-16 MED ORDER — ASPIRIN 81 MG PO TABS: 81.0000 mg | ORAL_TABLET | Freq: Every day | ORAL | Status: AC

## 2017-08-16 NOTE — Progress Notes (Signed)
Cardiology Office Note Date:  08/18/2017   ID:  [name] [name], DOB 1951/03/15, MRN [id number]  PCP:  Gordan Payment., MD  Cardiologist:  [name], MD    No chief complaint on file.    History of Present Illness: [name] [name] is a 67 y.o. male who presents for follow-up of CAD. He has a hx of CABG with early SVG failure and ultimately required PCI of the LIMA-LAD anastomosis. He's been maintained on long term DAPT.   Here alone today. Doing well - has lost weight with diet/exercise and feeling really good. Today, he denies symptoms of palpitations, chest pain, shortness of breath, orthopnea, PND, lower extremity edema, dizziness, or syncope.   Past Medical History:  Diagnosis Date  . CAD (coronary artery disease)    CABG  . Chronic pain syndrome   . GERD (gastroesophageal reflux disease)   . Hyperlipidemia   . Hypertension   . Radiculitis of right cervical region 09/04/2015    Past Surgical History:  Procedure Laterality Date  . CHOLECYSTECTOMY    . CORONARY ARTERY BYPASS GRAFT     multivessel  . HERNIA REPAIR    . LEFT HEART CATHETERIZATION WITH CORONARY/GRAFT ANGIOGRAM N/A 04/21/2013   Procedure: LEFT HEART CATHETERIZATION WITH [name];  Surgeon: Micheline Chapman, MD;  Location: [facility] CATH LAB;  Service: Cardiovascular;  Laterality: N/A;  . PCI 2009 with stenting of LIMA-LAD anastamosis      Current Outpatient Medications  Medication Sig Dispense Refill  . aspirin 81 MG tablet Take 1 tablet (81 mg total) by mouth daily.    Marland Kitchen atorvastatin (LIPITOR) 20 MG tablet TAKE 1 TABLET BY MOUTH EVERY DAY 90 tablet 1  . Cholecalciferol (VITAMIN D3) 2000 units capsule Take 1 capsule (2,000 Units total) by mouth daily. 30 capsule PRN  . clopidogrel (PLAVIX) 75 MG tablet TAKE 1 TABLET BY MOUTH EVERY DAY 90 tablet 1  . gabapentin (NEURONTIN) 300 MG capsule Take 1-3 capsules (300-900 mg total) by mouth 4 (four) times daily. 360 capsule 2  . hydrochlorothiazide  (HYDRODIURIL) 25 MG tablet Take 1 tablet (25 mg total) by mouth daily. 90 tablet 2  . isosorbide mononitrate (IMDUR) 30 MG 24 hr tablet TAKE 1 TABLET (30 MG TOTAL) BY MOUTH DAILY. 90 tablet 1  . magnesium oxide (MAG-OX) 400 MG tablet Take 1 tablet (400 mg total) by mouth daily. 90 tablet 0  . metoprolol succinate (TOPROL-XL) 50 MG 24 hr tablet TAKE 1 TABLET BY MOUTH EVERY DAY WITH OR IMMEDIATELY FOLLOWING A MEAL 90 tablet 0  . nitroGLYCERIN (NITROSTAT) 0.4 MG SL tablet Place 1 tablet (0.4 mg total) under the tongue every 5 (five) minutes as needed for chest pain. 25 tablet 1  . Oxycodone HCl 10 MG TABS Take 1 tablet (10 mg total) by mouth 5 (five) times daily as needed (pain). 150 tablet 0  . pantoprazole (PROTONIX) 40 MG tablet TAKE 1 TABLET BY MOUTH EVERY DAY 90 tablet 0  . potassium chloride SA (K-DUR,KLOR-CON) 20 MEQ tablet Take 20 mEq by mouth daily.     No current facility-administered medications for this visit.     Allergies:   Patient has no known allergies.   Social History:  The patient  reports that he has quit smoking. he has never used smokeless tobacco. He reports that he does not drink alcohol or use drugs.   Family History:  The patient's  family history includes Multiple sclerosis in his mother; Stroke in his father.  ROS:  Please see the history of present illness.  Otherwise, review of systems is positive for acid reflux.  All other systems are reviewed and negative.    PHYSICAL EXAM: VS:  BP 130/70   Pulse 60   Ht 5\' 8"  (1.727 m)   Wt 195 lb 6.4 oz (88.6 kg)   SpO2 97%   BMI 29.71 kg/m  , BMI Body mass index is 29.71 kg/m. GEN: Well nourished, well developed, in no acute distress  HEENT: normal  Neck: no JVD, no masses. No carotid bruits Cardiac: RRR without murmur or gallop                Respiratory:  clear to auscultation bilaterally, normal work of breathing GI: soft, nontender, nondistended, + BS MS: no deformity or atrophy  Ext: no pretibial edema,  pedal pulses 2+= bilaterally Skin: warm and dry, no rash Neuro:  Strength and sensation are intact Psych: euthymic mood, full affect  EKG:  EKG is ordered today. The ekg ordered today shows NSR 58 bpm, RBBB, PAC. No significant change from previous tracings.   Recent Labs: No results found for requested labs within last 8760 hours.   Lipid Panel     Component Value Date/Time   CHOL 160 06/18/2016 0954   TRIG 93 06/18/2016 0954   HDL 42 06/18/2016 0954   CHOLHDL 3.8 06/18/2016 0954   VLDL 19 06/18/2016 0954   LDLCALC 99 06/18/2016 0954   LDLDIRECT 92.7 07/04/2009 0946      Wt Readings from Last 3 Encounters:  08/16/17 195 lb 6.4 oz (88.6 kg)  05/17/17 200 lb (90.7 kg)  02/15/17 200 lb (90.7 kg)     ASSESSMENT AND PLAN: 1.  CAD, native vessel and bypass graft disease, with angina: doing well on current Rx. No changes recommended - doing better with lifestyle. Continue long-term DAPT with single remaining bypass graft which has been stented. Isosorbide and metoprolol for anti-anginal Rx.   2. HTN: BP controlled on HCTZ, isosorbide, and metoprolol.   3. Hyperlipidemia: treated with atorvastatin, has improved his diet. Will update labs.   Current medicines are reviewed with the patient today.  The patient does not have concerns regarding medicines.  Labs/ tests ordered today include:   Orders Placed This Encounter  Procedures  . Lipid panel  . Comprehensive metabolic panel  . EKG 12-Lead    Disposition:   FU 6 months  Signed, [name], MD  08/18/2017 10:53 PM    [facility] [address] [place], [place], [place]  [address] Phone: [phone]; Fax: [phone]

## 2017-08-16 NOTE — Patient Instructions (Addendum)
Medication Instructions:  1) DECREASE ASPIRIN to 81 mg daily  Labwork: Your provider recommends that you return for FASTING lab work.  Testing/Procedures: None  Follow-Up: Your provider wants you to follow-up in: 6 months with Dr. Excell Seltzer. You will receive a reminder letter in the mail two months in advance. If you don't receive a letter, please call our office to schedule the follow-up appointment.    Any Other Special Instructions Will Be Listed Below (If Applicable).     If you need a refill on your cardiac medications before your next appointment, please call your pharmacy.

## 2017-08-20 ENCOUNTER — Other Ambulatory Visit: Payer: Medicare Other

## 2017-08-25 DIAGNOSIS — M5030 Other cervical disc degeneration, unspecified cervical region: Secondary | ICD-10-CM | POA: Insufficient documentation

## 2017-08-25 DIAGNOSIS — M5090 Cervical disc disorder, unspecified, unspecified cervical region: Secondary | ICD-10-CM | POA: Insufficient documentation

## 2017-08-28 DIAGNOSIS — B182 Chronic viral hepatitis C: Secondary | ICD-10-CM | POA: Insufficient documentation

## 2017-08-28 DIAGNOSIS — E538 Deficiency of other specified B group vitamins: Secondary | ICD-10-CM | POA: Insufficient documentation

## 2017-09-02 ENCOUNTER — Ambulatory Visit: Payer: Medicare Other | Attending: Nurse Practitioner | Admitting: Nurse Practitioner

## 2017-09-02 ENCOUNTER — Other Ambulatory Visit: Payer: Self-pay

## 2017-09-02 ENCOUNTER — Encounter: Payer: Self-pay | Admitting: Nurse Practitioner

## 2017-09-02 VITALS — BP 124/77 | HR 55 | Temp 98.3°F | Ht 68.0 in | Wt 192.0 lb

## 2017-09-02 DIAGNOSIS — Z9889 Other specified postprocedural states: Secondary | ICD-10-CM | POA: Diagnosis not present

## 2017-09-02 DIAGNOSIS — G894 Chronic pain syndrome: Secondary | ICD-10-CM | POA: Insufficient documentation

## 2017-09-02 DIAGNOSIS — Z955 Presence of coronary angioplasty implant and graft: Secondary | ICD-10-CM | POA: Insufficient documentation

## 2017-09-02 DIAGNOSIS — Z951 Presence of aortocoronary bypass graft: Secondary | ICD-10-CM | POA: Insufficient documentation

## 2017-09-02 DIAGNOSIS — Z9049 Acquired absence of other specified parts of digestive tract: Secondary | ICD-10-CM | POA: Insufficient documentation

## 2017-09-02 DIAGNOSIS — Z8269 Family history of other diseases of the musculoskeletal system and connective tissue: Secondary | ICD-10-CM | POA: Diagnosis not present

## 2017-09-02 DIAGNOSIS — M5416 Radiculopathy, lumbar region: Secondary | ICD-10-CM | POA: Diagnosis not present

## 2017-09-02 DIAGNOSIS — Z79899 Other long term (current) drug therapy: Secondary | ICD-10-CM | POA: Diagnosis not present

## 2017-09-02 DIAGNOSIS — M792 Neuralgia and neuritis, unspecified: Secondary | ICD-10-CM | POA: Diagnosis not present

## 2017-09-02 DIAGNOSIS — K219 Gastro-esophageal reflux disease without esophagitis: Secondary | ICD-10-CM | POA: Insufficient documentation

## 2017-09-02 DIAGNOSIS — I10 Essential (primary) hypertension: Secondary | ICD-10-CM | POA: Diagnosis not present

## 2017-09-02 DIAGNOSIS — Z79891 Long term (current) use of opiate analgesic: Secondary | ICD-10-CM | POA: Diagnosis not present

## 2017-09-02 DIAGNOSIS — M542 Cervicalgia: Secondary | ICD-10-CM | POA: Insufficient documentation

## 2017-09-02 DIAGNOSIS — Z7982 Long term (current) use of aspirin: Secondary | ICD-10-CM | POA: Insufficient documentation

## 2017-09-02 DIAGNOSIS — I2510 Atherosclerotic heart disease of native coronary artery without angina pectoris: Secondary | ICD-10-CM | POA: Insufficient documentation

## 2017-09-02 DIAGNOSIS — Z87891 Personal history of nicotine dependence: Secondary | ICD-10-CM | POA: Diagnosis not present

## 2017-09-02 DIAGNOSIS — M79605 Pain in left leg: Secondary | ICD-10-CM | POA: Insufficient documentation

## 2017-09-02 DIAGNOSIS — Z823 Family history of stroke: Secondary | ICD-10-CM | POA: Diagnosis not present

## 2017-09-02 DIAGNOSIS — G8929 Other chronic pain: Secondary | ICD-10-CM

## 2017-09-02 MED ORDER — GABAPENTIN 300 MG PO CAPS: 300.0000 mg | ORAL_CAPSULE | Freq: Four times a day (QID) | ORAL | 2 refills | Status: DC

## 2017-09-02 MED ORDER — OXYCODONE HCL 10 MG PO TABS: 10.0000 mg | ORAL_TABLET | Freq: Every day | ORAL | 0 refills | Status: DC | PRN

## 2017-09-02 MED ORDER — MAGNESIUM OXIDE 400 MG PO TABS: 400.0000 mg | ORAL_TABLET | Freq: Every day | ORAL | 0 refills | Status: DC

## 2017-09-02 NOTE — Patient Instructions (Signed)

## 2017-09-02 NOTE — Progress Notes (Signed)
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 "electronically sent". You will need to get another appointment to get these prescribed. 4. Prescription Accuracy: You are responsible for carefully inspecting your prescriptions before leaving our office. Have the discharge nurse carefully go over each prescription with you, before taking them home. Make sure that your name is accurately spelled, that your address is correct. Check the name and dose of your medication to make sure it is accurate. Check the number of pills, and the written instructions to make sure they are clear and accurate. Make sure that you are given enough medication to last until your next medication refill appointment. 5. Taking Medication: Take medication as prescribed. Never take more pills than instructed. Never take medication more frequently than prescribed. Taking less pills or less frequently is permitted and encouraged, when it comes to controlled substances (written prescriptions).  6. Inform other  Doctors: Always inform, all of your healthcare providers, of all the medications you take. 7. Pain Medication from other Providers: You are not allowed to accept any additional pain medication from any other Doctor or Healthcare provider. There are two exceptions to this rule. (see below) In the event that you require additional pain medication, you are responsible for notifying us, as stated below. 8. Medication Agreement: You are responsible for carefully reading and following our Medication Agreement. This must be signed before receiving any prescriptions from our practice. Safely store a copy of your signed Agreement. Violations to the Agreement will result in no further prescriptions. (Additional copies of our Medication Agreement are available upon request.) 9. Laws, Rules, & Regulations: All patients are expected to follow all Federal and [employer], [employer], Rules, [employer] [facility]. Ignorance of the Laws does not constitute a valid excuse. The use of any illegal substances is prohibited. 10. Adopted CDC guidelines & recommendations: Target dosing levels will be at or below 60 MME/day. Use of benzodiazepines** is not recommended.  Exceptions: There are only two exceptions to the rule of not receiving pain medications from other Healthcare Providers. 1. Exception #1 (Emergencies): In the event of an emergency (i.e.: accident requiring emergency care), you are allowed to receive additional pain medication. However, you are responsible for: As soon as you are able, call our office [phone], at any time of the day or night, and leave a message stating your name, the date and nature of the emergency, and the name and dose of the medication prescribed. In the event that your call is answered by a member of our staff, make sure to document and save the date, time, and the name  of the person that took your information.  2. Exception #2 (Planned Surgery): In the event that you are scheduled by another  doctor or dentist to have any type of surgery or procedure, you are allowed (for a period no longer than 30 days), to receive additional pain medication, for the acute post-op pain. However, in this case, you are responsible for picking up a copy of our "Post-op Pain Management for Surgeons" handout, and giving it to your surgeon or dentist. This document is available at our office, and does not require an appointment to obtain it. Simply go to our office during business hours (Monday-Thursday from 8:00 AM to 4:00 PM) (Friday 8:00 AM to 12:00 Noon) or if you have a scheduled appointment with [place], prior to your surgery, and ask for it by name. In addition, you will need to provide [place] with your name, name of your surgeon, type of surgery, and date of procedure or surgery.  *Opioid medications include: morphine, codeine, oxycodone, oxymorphone, hydrocodone, hydromorphone, meperidine, tramadol, tapentadol, buprenorphine, fentanyl, methadone. **Benzodiazepine medications include: diazepam (Valium), alprazolam (Xanax), clonazepam (Klonopine), lorazepam (Ativan), clorazepate (Tranxene), chlordiazepoxide (Librium), estazolam (Prosom), oxazepam (Serax), temazepam (Restoril), triazolam (Halcion)  ____________________________________________________________________________________________

## 2017-09-02 NOTE — Progress Notes (Signed)
Nursing Pain Medication Assessment:  Safety precautions to be maintained throughout the outpatient stay will include: orient to surroundings, keep bed in low position, maintain call bell within reach at all times, provide assistance with transfer out of bed and ambulation.  Medication Inspection Compliance: Pill count conducted under aseptic conditions, in front of the patient. Neither the pills nor the bottle was removed from the patient's sight at any time. Once count was completed pills were immediately returned to the patient in their original bottle.  Medication: Oxycodone IR Pill/Patch Count: 61 of 150 pills remain Pill/Patch Appearance: Markings consistent with prescribed medication Bottle Appearance: Standard pharmacy container. Clearly labeled. Filled Date: 1 / 72 / 2019 Last Medication intake:  Today

## 2017-09-08 LAB — TOXASSURE SELECT 13 (MW), URINE

## 2017-10-05 ENCOUNTER — Other Ambulatory Visit: Payer: Self-pay | Admitting: Cardiovascular Disease

## 2017-10-10 ENCOUNTER — Other Ambulatory Visit: Payer: Self-pay | Admitting: Cardiovascular Disease

## 2017-11-13 ENCOUNTER — Other Ambulatory Visit: Payer: Self-pay | Admitting: Cardiovascular Disease

## 2017-11-30 ENCOUNTER — Ambulatory Visit: Payer: Medicare Other | Attending: Nurse Practitioner | Admitting: Nurse Practitioner

## 2017-11-30 ENCOUNTER — Encounter: Payer: Self-pay | Admitting: Nurse Practitioner

## 2017-11-30 VITALS — BP 140/81 | HR 71 | Temp 97.8°F | Resp 16 | Ht 68.5 in | Wt 196.0 lb

## 2017-11-30 DIAGNOSIS — Z87891 Personal history of nicotine dependence: Secondary | ICD-10-CM | POA: Diagnosis not present

## 2017-11-30 DIAGNOSIS — E669 Obesity, unspecified: Secondary | ICD-10-CM | POA: Diagnosis not present

## 2017-11-30 DIAGNOSIS — E785 Hyperlipidemia, unspecified: Secondary | ICD-10-CM | POA: Insufficient documentation

## 2017-11-30 DIAGNOSIS — M79605 Pain in left leg: Secondary | ICD-10-CM | POA: Diagnosis not present

## 2017-11-30 DIAGNOSIS — Z6829 Body mass index (BMI) 29.0-29.9, adult: Secondary | ICD-10-CM | POA: Insufficient documentation

## 2017-11-30 DIAGNOSIS — Z79899 Other long term (current) drug therapy: Secondary | ICD-10-CM | POA: Insufficient documentation

## 2017-11-30 DIAGNOSIS — E559 Vitamin D deficiency, unspecified: Secondary | ICD-10-CM | POA: Diagnosis not present

## 2017-11-30 DIAGNOSIS — G8929 Other chronic pain: Secondary | ICD-10-CM

## 2017-11-30 DIAGNOSIS — M545 Low back pain: Secondary | ICD-10-CM | POA: Diagnosis present

## 2017-11-30 DIAGNOSIS — I2510 Atherosclerotic heart disease of native coronary artery without angina pectoris: Secondary | ICD-10-CM | POA: Diagnosis not present

## 2017-11-30 DIAGNOSIS — M109 Gout, unspecified: Secondary | ICD-10-CM | POA: Insufficient documentation

## 2017-11-30 DIAGNOSIS — Z951 Presence of aortocoronary bypass graft: Secondary | ICD-10-CM | POA: Insufficient documentation

## 2017-11-30 DIAGNOSIS — Z820 Family history of epilepsy and other diseases of the nervous system: Secondary | ICD-10-CM | POA: Diagnosis not present

## 2017-11-30 DIAGNOSIS — M5416 Radiculopathy, lumbar region: Secondary | ICD-10-CM

## 2017-11-30 DIAGNOSIS — M4726 Other spondylosis with radiculopathy, lumbar region: Secondary | ICD-10-CM | POA: Insufficient documentation

## 2017-11-30 DIAGNOSIS — I252 Old myocardial infarction: Secondary | ICD-10-CM | POA: Diagnosis not present

## 2017-11-30 DIAGNOSIS — G894 Chronic pain syndrome: Secondary | ICD-10-CM

## 2017-11-30 DIAGNOSIS — M5137 Other intervertebral disc degeneration, lumbosacral region: Secondary | ICD-10-CM | POA: Insufficient documentation

## 2017-11-30 DIAGNOSIS — Z7982 Long term (current) use of aspirin: Secondary | ICD-10-CM | POA: Insufficient documentation

## 2017-11-30 DIAGNOSIS — M5412 Radiculopathy, cervical region: Secondary | ICD-10-CM | POA: Diagnosis not present

## 2017-11-30 DIAGNOSIS — M79604 Pain in right leg: Secondary | ICD-10-CM | POA: Diagnosis not present

## 2017-11-30 DIAGNOSIS — Z7902 Long term (current) use of antithrombotics/antiplatelets: Secondary | ICD-10-CM | POA: Diagnosis not present

## 2017-11-30 DIAGNOSIS — Z79891 Long term (current) use of opiate analgesic: Secondary | ICD-10-CM | POA: Insufficient documentation

## 2017-11-30 DIAGNOSIS — R7303 Prediabetes: Secondary | ICD-10-CM | POA: Insufficient documentation

## 2017-11-30 DIAGNOSIS — Z9049 Acquired absence of other specified parts of digestive tract: Secondary | ICD-10-CM | POA: Insufficient documentation

## 2017-11-30 DIAGNOSIS — Z955 Presence of coronary angioplasty implant and graft: Secondary | ICD-10-CM | POA: Insufficient documentation

## 2017-11-30 DIAGNOSIS — K219 Gastro-esophageal reflux disease without esophagitis: Secondary | ICD-10-CM | POA: Diagnosis not present

## 2017-11-30 DIAGNOSIS — Z5181 Encounter for therapeutic drug level monitoring: Secondary | ICD-10-CM | POA: Insufficient documentation

## 2017-11-30 DIAGNOSIS — M792 Neuralgia and neuritis, unspecified: Secondary | ICD-10-CM | POA: Diagnosis not present

## 2017-11-30 DIAGNOSIS — I10 Essential (primary) hypertension: Secondary | ICD-10-CM | POA: Diagnosis not present

## 2017-11-30 DIAGNOSIS — Z823 Family history of stroke: Secondary | ICD-10-CM | POA: Diagnosis not present

## 2017-11-30 MED ORDER — OXYCODONE HCL 10 MG PO TABS: 10.0000 mg | ORAL_TABLET | Freq: Every day | ORAL | 0 refills | Status: DC | PRN

## 2017-11-30 MED ORDER — MAGNESIUM OXIDE 400 MG PO TABS: 400.0000 mg | ORAL_TABLET | Freq: Every day | ORAL | 0 refills | Status: DC

## 2017-11-30 MED ORDER — GABAPENTIN 300 MG PO CAPS: 300.0000 mg | ORAL_CAPSULE | Freq: Four times a day (QID) | ORAL | 2 refills | Status: DC

## 2017-11-30 NOTE — Progress Notes (Signed)
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 is permitted and encouraged, when it comes to controlled substances (written prescriptions).  6. Inform other Doctors: Always inform, all of your healthcare providers, of all the medications you take. 7. Pain Medication from other Providers: You are not allowed to accept any additional pain medication from any other Doctor or Healthcare provider. There are two exceptions to this rule. (see below) In the event that you require additional pain medication, you are responsible for notifying us, as stated below. 8. Medication Agreement: You are responsible for carefully reading and following our Medication Agreement. This must be signed before receiving any prescriptions from our practice. Safely store a copy of your signed Agreement. Violations to the Agreement will result in no further prescriptions. (Additional copies of our Medication Agreement are available upon request.) 9. Laws, Rules, & Regulations:  All patients are expected to follow all Federal and [employer], [employer], Rules, [employer]. Ignorance of the Laws does not constitute a valid excuse. The use of any illegal substances is prohibited. 10. Adopted CDC guidelines & recommendations: Target dosing levels will be at or below 60 MME/day. Use of benzodiazepines** is not recommended.  Exceptions: There are only two exceptions to the rule of not receiving pain medications from other Healthcare Providers. 1. Exception #1 (Emergencies): In the event of an emergency (i.e.: accident requiring emergency care), you are allowed to receive additional pain medication. However, you are responsible for: As soon as you are able, call our office [phone], at any time of the day or night, and leave a message stating your name, the date and nature of the emergency, and the name and dose of the medication prescribed. In the event that your call is answered by a member of our staff, make sure to document and save the date, time, and the name of the person that took your information.  2. Exception #2 (Planned Surgery): In the event that you are scheduled by another doctor or dentist to have any type of surgery or procedure, you are allowed (for a period no longer than 30 days), to receive additional pain medication, for the acute post-op pain. However, in this case, you are responsible for picking up a copy of our "Post-op Pain Management for Surgeons" handout, and giving it to your surgeon or dentist. This document is available at our office, and does not require an appointment to obtain it. Simply go to our office during business hours (Monday-Thursday from 8:00 AM to 4:00 PM) (Friday 8:00 AM to 12:00 Noon) or if you have a scheduled appointment with [place], prior to your surgery, and ask for it by name. In addition, you will need to provide [place] with your name, name of your surgeon, type of  surgery, and date of procedure or surgery.  *Opioid medications include:  morphine, codeine, oxycodone, oxymorphone, hydrocodone, hydromorphone, meperidine, tramadol, tapentadol, buprenorphine, fentanyl, methadone. **Benzodiazepine medications include: diazepam (Valium), alprazolam (Xanax), clonazepam (Klonopine), lorazepam (Ativan), clorazepate (Tranxene), chlordiazepoxide (Librium), estazolam (Prosom), oxazepam (Serax), temazepam (Restoril), triazolam (Halcion) (Last updated: 09/30/2017) ____________________________________________________________________________________________   BMI Assessment: Estimated body mass index is 29.37 kg/m as calculated from the following:   Height as of this encounter: 5' 8.5" (1.74 m).   Weight as of this encounter: 196 lb (88.9 kg).  BMI interpretation table: BMI level Category Range association with higher incidence of chronic pain  <18 kg/m2 Underweight   18.5-24.9 kg/m2 Ideal body weight   25-29.9 kg/m2 Overweight Increased incidence by 20%  30-34.9 kg/m2 Obese (Class I) Increased incidence by 68%  35-39.9 kg/m2 Severe obesity (Class II) Increased incidence by 136%  >40 kg/m2 Extreme obesity (Class III) Increased incidence by 254%   Patient's current BMI Ideal Body weight  Body mass index is 29.37 kg/m. Ideal body weight: 69.6 kg (153 lb 5.3 oz) Adjusted ideal body weight: 77.3 kg (170 lb 6.4 oz)   BMI Readings from Last 4 Encounters:  11/30/17 29.37 kg/m  09/02/17 29.19 kg/m  08/16/17 29.71 kg/m  05/17/17 30.41 kg/m   Wt Readings from Last 4 Encounters:  11/30/17 196 lb (88.9 kg)  09/02/17 192 lb (87.1 kg)  08/16/17 195 lb 6.4 oz (88.6 kg)  05/17/17 200 lb (90.7 kg)

## 2017-11-30 NOTE — Patient Instructions (Signed)
____________________________________________________________________________________________  Medication Rules  Applies to: All patients receiving prescriptions (written or electronic).  Pharmacy of record: Pharmacy where electronic prescriptions will be sent. If written prescriptions are taken to a different pharmacy, please inform the nursing staff. The pharmacy listed in the electronic medical record should be the one where you would like electronic prescriptions to be sent.  Prescription refills: Only during scheduled appointments. Applies to both, written and electronic prescriptions.  NOTE: The following applies primarily to controlled substances (Opioid* Pain Medications).   Patient's responsibilities: 1. Pain Pills: Bring all pain pills to every appointment (except for procedure appointments). 2. Pill Bottles: Bring pills in original pharmacy bottle. Always bring newest bottle. Bring bottle, even if empty. 3. Medication refills: You are responsible for knowing and keeping track of what medications you need refilled. The day before your appointment, write a list of all prescriptions that need to be refilled. Bring that list to your appointment and give it to the admitting nurse. Prescriptions will be written only during appointments. If you forget a medication, it will not be "Called in", "Faxed", or "electronically sent". You will need to get another appointment to get these prescribed. 4. Prescription Accuracy: You are responsible for carefully inspecting your prescriptions before leaving our office. Have the discharge nurse carefully go over each prescription with you, before taking them home. Make sure that your name is accurately spelled, that your address is correct. Check the name and dose of your medication to make sure it is accurate. Check the number of pills, and the written instructions to make sure they are clear and accurate. Make sure that you are given enough medication to last  until your next medication refill appointment. 5. Taking Medication: Take medication as prescribed. Never take more pills than instructed. Never take medication more frequently than prescribed. Taking less pills or less frequently is permitted and encouraged, when it comes to controlled substances (written prescriptions).  6. Inform other Doctors: Always inform, all of your healthcare providers, of all the medications you take. 7. Pain Medication from other Providers: You are not allowed to accept any additional pain medication from any other Doctor or Healthcare provider. There are two exceptions to this rule. (see below) In the event that you require additional pain medication, you are responsible for notifying us, as stated below. 8. Medication Agreement: You are responsible for carefully reading and following our Medication Agreement. This must be signed before receiving any prescriptions from our practice. Safely store a copy of your signed Agreement. Violations to the Agreement will result in no further prescriptions. (Additional copies of our Medication Agreement are available upon request.) 9. Laws, Rules, & Regulations: All patients are expected to follow all Federal and State Laws, Statutes, Rules, & Regulations. Ignorance of the Laws does not constitute a valid excuse. The use of any illegal substances is prohibited. 10. Adopted CDC guidelines & recommendations: Target dosing levels will be at or below 60 MME/day. Use of benzodiazepines** is not recommended.  Exceptions: There are only two exceptions to the rule of not receiving pain medications from other Healthcare Providers. 1. Exception #1 (Emergencies): In the event of an emergency (i.e.: accident requiring emergency care), you are allowed to receive additional pain medication. However, you are responsible for: As soon as you are able, call our office [phone], at any time of the day or night, and leave a message stating your name, the  date and nature of the emergency, and the name and dose of the medication   prescribed. In the event that your call is answered by a member of our staff, make sure to document and save the date, time, and the name of the person that took your information.  2. Exception #2 (Planned Surgery): In the event that you are scheduled by another doctor or dentist to have any type of surgery or procedure, you are allowed (for a period no longer than 30 days), to receive additional pain medication, for the acute post-op pain. However, in this case, you are responsible for picking up a copy of our "Post-op Pain Management for Surgeons" handout, and giving it to your surgeon or dentist. This document is available at our office, and does not require an appointment to obtain it. Simply go to our office during business hours (Monday-Thursday from 8:00 AM to 4:00 PM) (Friday 8:00 AM to 12:00 Noon) or if you have a scheduled appointment with [place], prior to your surgery, and ask for it by name. In addition, you will need to provide [place] with your name, name of your surgeon, type of surgery, and date of procedure or surgery.  *Opioid medications include: morphine, codeine, oxycodone, oxymorphone, hydrocodone, hydromorphone, meperidine, tramadol, tapentadol, buprenorphine, fentanyl, methadone. **Benzodiazepine medications include: diazepam (Valium), alprazolam (Xanax), clonazepam (Klonopine), lorazepam (Ativan), clorazepate (Tranxene), chlordiazepoxide (Librium), estazolam (Prosom), oxazepam (Serax), temazepam (Restoril), triazolam (Halcion) (Last updated: 09/30/2017) ____________________________________________________________________________________________   BMI Assessment: Estimated body mass index is 29.37 kg/m as calculated from the following:   Height as of this encounter: 5' 8.5" (1.74 m).   Weight as of this encounter: 196 lb (88.9 kg).  BMI interpretation table: BMI level Category Range association with higher  incidence of chronic pain  <18 kg/m2 Underweight   18.5-24.9 kg/m2 Ideal body weight   25-29.9 kg/m2 Overweight Increased incidence by 20%  30-34.9 kg/m2 Obese (Class I) Increased incidence by 68%  35-39.9 kg/m2 Severe obesity (Class II) Increased incidence by 136%  >40 kg/m2 Extreme obesity (Class III) Increased incidence by 254%   Patient's current BMI Ideal Body weight  Body mass index is 29.37 kg/m. Ideal body weight: 69.6 kg (153 lb 5.3 oz) Adjusted ideal body weight: 77.3 kg (170 lb 6.4 oz)   BMI Readings from Last 4 Encounters:  11/30/17 29.37 kg/m  09/02/17 29.19 kg/m  08/16/17 29.71 kg/m  05/17/17 30.41 kg/m   Wt Readings from Last 4 Encounters:  11/30/17 196 lb (88.9 kg)  09/02/17 192 lb (87.1 kg)  08/16/17 195 lb 6.4 oz (88.6 kg)  05/17/17 200 lb (90.7 kg)

## 2017-11-30 NOTE — Progress Notes (Signed)
5 8.5lbsNursing Pain Medication Assessment:  Safety precautions to be maintained throughout the outpatient stay will include: orient to surroundings, keep bed in low position, maintain call bell within reach at all times, provide assistance with transfer out of bed and ambulation.  Medication Inspection Compliance: Pill count conducted under aseptic conditions, in front of the patient. Neither the pills nor the bottle was removed from the patient's sight at any time. Once count was completed pills were immediately returned to the patient in their original bottle.  Medication: See above Pill/Patch Count: 70 of 150 pills remain Pill/Patch Appearance: Markings consistent with prescribed medication Bottle Appearance: Standard pharmacy container. Clearly labeled. Filled Date: 04 / 13 / 2018 Last Medication intake:  Today

## 2017-12-15 ENCOUNTER — Other Ambulatory Visit: Payer: Self-pay | Admitting: Cardiovascular Disease

## 2017-12-16 ENCOUNTER — Other Ambulatory Visit: Payer: Self-pay | Admitting: Cardiovascular Disease

## 2017-12-16 DIAGNOSIS — I10 Essential (primary) hypertension: Secondary | ICD-10-CM

## 2017-12-16 DIAGNOSIS — E785 Hyperlipidemia, unspecified: Secondary | ICD-10-CM

## 2017-12-26 ENCOUNTER — Other Ambulatory Visit: Payer: Self-pay | Admitting: Cardiovascular Disease

## 2017-12-26 DIAGNOSIS — I10 Essential (primary) hypertension: Secondary | ICD-10-CM

## 2017-12-26 DIAGNOSIS — E785 Hyperlipidemia, unspecified: Secondary | ICD-10-CM

## 2017-12-26 DIAGNOSIS — I25810 Atherosclerosis of coronary artery bypass graft(s) without angina pectoris: Secondary | ICD-10-CM

## 2018-03-01 ENCOUNTER — Ambulatory Visit: Payer: Medicare Other | Attending: Nurse Practitioner | Admitting: Nurse Practitioner

## 2018-03-01 ENCOUNTER — Other Ambulatory Visit: Payer: Self-pay

## 2018-03-01 ENCOUNTER — Encounter: Payer: Self-pay | Admitting: Nurse Practitioner

## 2018-03-01 VITALS — BP 132/98 | HR 76 | Temp 98.0°F | Resp 16 | Ht 68.0 in | Wt 194.9 lb

## 2018-03-01 DIAGNOSIS — M545 Low back pain: Secondary | ICD-10-CM | POA: Insufficient documentation

## 2018-03-01 DIAGNOSIS — E669 Obesity, unspecified: Secondary | ICD-10-CM | POA: Insufficient documentation

## 2018-03-01 DIAGNOSIS — M792 Neuralgia and neuritis, unspecified: Secondary | ICD-10-CM

## 2018-03-01 DIAGNOSIS — Z6829 Body mass index (BMI) 29.0-29.9, adult: Secondary | ICD-10-CM | POA: Insufficient documentation

## 2018-03-01 DIAGNOSIS — Z7982 Long term (current) use of aspirin: Secondary | ICD-10-CM | POA: Diagnosis not present

## 2018-03-01 DIAGNOSIS — E559 Vitamin D deficiency, unspecified: Secondary | ICD-10-CM | POA: Diagnosis not present

## 2018-03-01 DIAGNOSIS — M5412 Radiculopathy, cervical region: Secondary | ICD-10-CM | POA: Diagnosis not present

## 2018-03-01 DIAGNOSIS — E782 Mixed hyperlipidemia: Secondary | ICD-10-CM | POA: Diagnosis not present

## 2018-03-01 DIAGNOSIS — R7303 Prediabetes: Secondary | ICD-10-CM | POA: Insufficient documentation

## 2018-03-01 DIAGNOSIS — D649 Anemia, unspecified: Secondary | ICD-10-CM | POA: Insufficient documentation

## 2018-03-01 DIAGNOSIS — I2510 Atherosclerotic heart disease of native coronary artery without angina pectoris: Secondary | ICD-10-CM | POA: Diagnosis not present

## 2018-03-01 DIAGNOSIS — R079 Chest pain, unspecified: Secondary | ICD-10-CM | POA: Diagnosis not present

## 2018-03-01 DIAGNOSIS — G8929 Other chronic pain: Secondary | ICD-10-CM

## 2018-03-01 DIAGNOSIS — B182 Chronic viral hepatitis C: Secondary | ICD-10-CM | POA: Insufficient documentation

## 2018-03-01 DIAGNOSIS — Z5181 Encounter for therapeutic drug level monitoring: Secondary | ICD-10-CM | POA: Diagnosis present

## 2018-03-01 DIAGNOSIS — Z7902 Long term (current) use of antithrombotics/antiplatelets: Secondary | ICD-10-CM | POA: Diagnosis not present

## 2018-03-01 DIAGNOSIS — Z951 Presence of aortocoronary bypass graft: Secondary | ICD-10-CM | POA: Insufficient documentation

## 2018-03-01 DIAGNOSIS — G894 Chronic pain syndrome: Secondary | ICD-10-CM | POA: Insufficient documentation

## 2018-03-01 DIAGNOSIS — M47816 Spondylosis without myelopathy or radiculopathy, lumbar region: Secondary | ICD-10-CM | POA: Insufficient documentation

## 2018-03-01 DIAGNOSIS — K219 Gastro-esophageal reflux disease without esophagitis: Secondary | ICD-10-CM | POA: Insufficient documentation

## 2018-03-01 DIAGNOSIS — E876 Hypokalemia: Secondary | ICD-10-CM | POA: Diagnosis not present

## 2018-03-01 DIAGNOSIS — Z87891 Personal history of nicotine dependence: Secondary | ICD-10-CM | POA: Diagnosis not present

## 2018-03-01 DIAGNOSIS — Z79899 Other long term (current) drug therapy: Secondary | ICD-10-CM | POA: Diagnosis not present

## 2018-03-01 DIAGNOSIS — I951 Orthostatic hypotension: Secondary | ICD-10-CM | POA: Insufficient documentation

## 2018-03-01 DIAGNOSIS — M5090 Cervical disc disorder, unspecified, unspecified cervical region: Secondary | ICD-10-CM

## 2018-03-01 MED ORDER — OXYCODONE HCL 10 MG PO TABS: 10.0000 mg | ORAL_TABLET | Freq: Every day | ORAL | 0 refills | Status: DC | PRN

## 2018-03-01 MED ORDER — MAGNESIUM OXIDE 400 MG PO TABS: 400.0000 mg | ORAL_TABLET | Freq: Every day | ORAL | 0 refills | Status: DC

## 2018-03-01 MED ORDER — GABAPENTIN 300 MG PO CAPS
300.0000 mg | ORAL_CAPSULE | Freq: Four times a day (QID) | ORAL | 2 refills | Status: DC
Start: 2018-03-01 — End: 2018-05-25

## 2018-03-01 MED ORDER — VITAMIN D3 50 MCG (2000 UT) PO CAPS: ORAL_CAPSULE | ORAL | 99 refills | Status: DC

## 2018-03-01 NOTE — Progress Notes (Signed)
Nursing Pain Medication Assessment:  Safety precautions to be maintained throughout the outpatient stay will include: orient to surroundings, keep bed in low position, maintain call bell within reach at all times, provide assistance with transfer out of bed and ambulation.  Medication Inspection Compliance: Pill count conducted under aseptic conditions, in front of the patient. Neither the pills nor the bottle was removed from the patient's sight at any time. Once count was completed pills were immediately returned to the patient in their original bottle.  Medication: See above Pill/Patch Count: 62 of 150 pills remain Pill/Patch Appearance: Markings consistent with prescribed medication Bottle Appearance: Standard pharmacy container. Clearly labeled. Filled Date: 7 / 12 / 2019 Last Medication intake:  Today

## 2018-03-01 NOTE — Progress Notes (Signed)
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 appointments. If you forget a medication, it will not be "Called in", "Faxed", or "electronically sent". You will need to get another appointment to get these prescribed. 4. Prescription Accuracy: You are responsible for carefully inspecting your prescriptions before leaving our office. Have the discharge nurse carefully go over each prescription with you, before taking them home. Make sure that your name is accurately spelled, that your address is correct. Check the name and dose of your medication to make sure it is accurate. Check the number of pills, and the written instructions to make sure they are clear and accurate. Make sure that you are given enough medication to last until your next medication refill appointment. 5. Taking Medication: Take medication as prescribed. Never take more pills than instructed. Never take medication more frequently than prescribed. Taking less pills or less  frequently is permitted and encouraged, when it comes to controlled substances (written prescriptions).  6. Inform other Doctors: Always inform, all of your healthcare providers, of all the medications you take. 7. Pain Medication from other Providers: You are not allowed to accept any additional pain medication from any other Doctor or Healthcare provider. There are two exceptions to this rule. (see below) In the event that you require additional pain medication, you are responsible for notifying us, as stated below. 8. Medication Agreement: You are responsible for carefully reading and following our Medication Agreement. This must be signed before receiving any prescriptions from our practice. Safely store a copy of your signed Agreement. Violations to the Agreement will result in no further prescriptions. (Additional copies of our Medication Agreement are available upon request.) 9. Laws, Rules, & Regulations: All patients are expected to follow all Federal and [employer], [employer], Rules, [employer] [facility]. Ignorance of the Laws does not constitute a valid excuse. The use of any illegal substances is prohibited. 10. Adopted CDC guidelines & recommendations: Target dosing levels will be at or below 60 MME/day. Use of benzodiazepines** is not recommended.  Exceptions: There are only two exceptions to the rule of not receiving pain medications from other Healthcare Providers. 1. Exception #1 (Emergencies): In the event of an emergency (i.e.: accident requiring  emergency care), you are allowed to receive additional pain medication. However, you are responsible for: As soon as you are able, call our office [phone], at any time of the day or night, and leave a message stating your name, the date and nature of the emergency, and the name and dose of the medication prescribed. In the event that your call is answered by a member of our staff, make sure to document and save the date, time, and the name of the  person that took your information.  2. Exception #2 (Planned Surgery): In the event that you are scheduled by another doctor or dentist to have any type of surgery or procedure, you are allowed (for a period no longer than 30 days), to receive additional pain medication, for the acute post-op pain. However, in this case, you are responsible for picking up a copy of our "Post-op Pain Management for Surgeons" handout, and giving it to your surgeon or dentist. This document is available at our office, and does not require an appointment to obtain it. Simply go to our office during business hours (Monday-Thursday from 8:00 AM to 4:00 PM) (Friday 8:00 AM to 12:00 Noon) or if you have a scheduled appointment with [place], prior to your surgery, and ask for it by name. In addition, you will need to provide [place] with your name, name of your surgeon, type of surgery, and date of procedure or surgery.  *Opioid medications include: morphine, codeine, oxycodone, oxymorphone, hydrocodone, hydromorphone, meperidine, tramadol, tapentadol, buprenorphine, fentanyl, methadone. **Benzodiazepine medications include: diazepam (Valium), alprazolam (Xanax), clonazepam (Klonopine), lorazepam (Ativan), clorazepate (Tranxene), chlordiazepoxide (Librium), estazolam (Prosom), oxazepam (Serax), temazepam (Restoril), triazolam (Halcion) (Last updated: 09/30/2017) ____________________________________________________________________________________________

## 2018-03-01 NOTE — Patient Instructions (Signed)
____________________________________________________________________________________________  Medication Rules  Applies to: All patients receiving prescriptions (written or electronic).  Pharmacy of record: Pharmacy where electronic prescriptions will be sent. If written prescriptions are taken to a different pharmacy, please inform the nursing staff. The pharmacy listed in the electronic medical record should be the one where you would like electronic prescriptions to be sent.  Prescription refills: Only during scheduled appointments. Applies to both, written and electronic prescriptions.  NOTE: The following applies primarily to controlled substances (Opioid* Pain Medications).   Patient's responsibilities: 1. Pain Pills: Bring all pain pills to every appointment (except for procedure appointments). 2. Pill Bottles: Bring pills in original pharmacy bottle. Always bring newest bottle. Bring bottle, even if empty. 3. Medication refills: You are responsible for knowing and keeping track of what medications you need refilled. The day before your appointment, write a list of all prescriptions that need to be refilled. Bring that list to your appointment and give it to the admitting nurse. Prescriptions will be written only during appointments. If you forget a medication, it will not be "Called in", "Faxed", or "electronically sent". You will need to get another appointment to get these prescribed. 4. Prescription Accuracy: You are responsible for carefully inspecting your prescriptions before leaving our office. Have the discharge nurse carefully go over each prescription with you, before taking them home. Make sure that your name is accurately spelled, that your address is correct. Check the name and dose of your medication to make sure it is accurate. Check the number of pills, and the written instructions to make sure they are clear and accurate. Make sure that you are given enough medication to last  until your next medication refill appointment. 5. Taking Medication: Take medication as prescribed. Never take more pills than instructed. Never take medication more frequently than prescribed. Taking less pills or less frequently is permitted and encouraged, when it comes to controlled substances (written prescriptions).  6. Inform other Doctors: Always inform, all of your healthcare providers, of all the medications you take. 7. Pain Medication from other Providers: You are not allowed to accept any additional pain medication from any other Doctor or Healthcare provider. There are two exceptions to this rule. (see below) In the event that you require additional pain medication, you are responsible for notifying us, as stated below. 8. Medication Agreement: You are responsible for carefully reading and following our Medication Agreement. This must be signed before receiving any prescriptions from our practice. Safely store a copy of your signed Agreement. Violations to the Agreement will result in no further prescriptions. (Additional copies of our Medication Agreement are available upon request.) 9. Laws, Rules, & Regulations: All patients are expected to follow all Federal and State Laws, Statutes, Rules, & Regulations. Ignorance of the Laws does not constitute a valid excuse. The use of any illegal substances is prohibited. 10. Adopted CDC guidelines & recommendations: Target dosing levels will be at or below 60 MME/day. Use of benzodiazepines** is not recommended.  Exceptions: There are only two exceptions to the rule of not receiving pain medications from other Healthcare Providers. 1. Exception #1 (Emergencies): In the event of an emergency (i.e.: accident requiring emergency care), you are allowed to receive additional pain medication. However, you are responsible for: As soon as you are able, call our office [phone], at any time of the day or night, and leave a message stating your name, the  date and nature of the emergency, and the name and dose of the medication   prescribed. In the event that your call is answered by a member of our staff, make sure to document and save the date, time, and the name of the person that took your information.  2. Exception #2 (Planned Surgery): In the event that you are scheduled by another doctor or dentist to have any type of surgery or procedure, you are allowed (for a period no longer than 30 days), to receive additional pain medication, for the acute post-op pain. However, in this case, you are responsible for picking up a copy of our "Post-op Pain Management for Surgeons" handout, and giving it to your surgeon or dentist. This document is available at our office, and does not require an appointment to obtain it. Simply go to our office during business hours (Monday-Thursday from 8:00 AM to 4:00 PM) (Friday 8:00 AM to 12:00 Noon) or if you have a scheduled appointment with us, prior to your surgery, and ask for it by name. In addition, you will need to provide us with your name, name of your surgeon, type of surgery, and date of procedure or surgery.  *Opioid medications include: morphine, codeine, oxycodone, oxymorphone, hydrocodone, hydromorphone, meperidine, tramadol, tapentadol, buprenorphine, fentanyl, methadone. **Benzodiazepine medications include: diazepam (Valium), alprazolam (Xanax), clonazepam (Klonopine), lorazepam (Ativan), clorazepate (Tranxene), chlordiazepoxide (Librium), estazolam (Prosom), oxazepam (Serax), temazepam (Restoril), triazolam (Halcion) (Last updated: 09/30/2017) ____________________________________________________________________________________________    

## 2018-04-11 ENCOUNTER — Telehealth: Payer: Self-pay | Admitting: Cardiovascular Disease

## 2018-04-11 NOTE — Telephone Encounter (Signed)
° °  Patient states he may need labs done Patient requesting order for labs. Please advise

## 2018-04-11 NOTE — Telephone Encounter (Signed)
Left the pt a message to call the office back and request to speak with a triage nurse.  

## 2018-04-12 ENCOUNTER — Ambulatory Visit: Payer: Medicare Other | Admitting: Cardiology

## 2018-04-12 NOTE — Telephone Encounter (Signed)
Patient was to come back for fasting lab work per January office visit. He has a 6 mo appointment with Lizabeth Leyden later this month. He lives in [place].  Instructed patient to come to appointment fasting and he can have his labs drawn that day.  He was grateful for assistance.

## 2018-04-25 ENCOUNTER — Encounter: Payer: Self-pay | Admitting: Cardiology

## 2018-04-25 ENCOUNTER — Other Ambulatory Visit: Payer: Medicare Other | Admitting: *Deleted

## 2018-04-25 ENCOUNTER — Ambulatory Visit: Payer: Medicare Other | Admitting: Cardiology

## 2018-04-25 VITALS — BP 120/60 | HR 71 | Ht 68.0 in | Wt 190.0 lb

## 2018-04-25 DIAGNOSIS — I2510 Atherosclerotic heart disease of native coronary artery without angina pectoris: Secondary | ICD-10-CM

## 2018-04-25 DIAGNOSIS — E7800 Pure hypercholesterolemia, unspecified: Secondary | ICD-10-CM

## 2018-04-25 DIAGNOSIS — I10 Essential (primary) hypertension: Secondary | ICD-10-CM

## 2018-04-25 DIAGNOSIS — E785 Hyperlipidemia, unspecified: Secondary | ICD-10-CM

## 2018-04-25 LAB — COMPREHENSIVE METABOLIC PANEL
ALT: 18 IU/L (ref 0–44)
AST: 38 IU/L (ref 0–40)
Albumin/Globulin Ratio: 1.3 (ref 1.2–2.2)
Albumin: 3.9 g/dL (ref 3.6–4.8)
Alkaline Phosphatase: 82 IU/L (ref 39–117)
BILIRUBIN TOTAL: 1.2 mg/dL (ref 0.0–1.2)
BUN/Creatinine Ratio: 9 — ABNORMAL LOW (ref 10–24)
BUN: 9 mg/dL (ref 8–27)
CHLORIDE: 96 mmol/L (ref 96–106)
CO2: 29 mmol/L (ref 20–29)
Calcium: 9.2 mg/dL (ref 8.6–10.2)
Creatinine, Ser: 0.99 mg/dL (ref 0.76–1.27)
GFR calc non Af Amer: 79 mL/min/{1.73_m2} (ref >59)
GFR, EST AFRICAN AMERICAN: 91 mL/min/{1.73_m2} (ref >59)
GLOBULIN, TOTAL: 3 g/dL (ref 1.5–4.5)
Glucose: 98 mg/dL (ref 65–99)
POTASSIUM: 3.3 mmol/L — AB (ref 3.5–5.2)
SODIUM: 141 mmol/L (ref 134–144)
Total Protein: 6.9 g/dL (ref 6.0–8.5)

## 2018-04-25 LAB — LIPID PANEL
CHOL/HDL RATIO: 2.1 ratio (ref 0.0–5.0)
CHOLESTEROL TOTAL: 88 mg/dL — AB (ref 100–199)
HDL: 42 mg/dL (ref >39)
LDL CALC: 35 mg/dL (ref 0–99)
TRIGLYCERIDES: 53 mg/dL (ref 0–149)
VLDL Cholesterol Cal: 11 mg/dL (ref 5–40)

## 2018-04-25 NOTE — Patient Instructions (Signed)
Medication Instructions: Your physician recommends that you continue on your current medications as directed. Please refer to the Current Medication list given to you today.   Labwork: TODAY: CMET & LIPIDS   Procedures/Testing: None  Follow-Up: Your physician wants you to follow-up in: 6 months with Dr. Theodoro Parma will receive a reminder letter in the mail two months in advance. If you don't receive a letter, please call our office to schedule the follow-up appointment.   Any Additional Special Instructions Will Be Listed Below (If Applicable).   DASH Eating Plan DASH stands for "Dietary Approaches to Stop Hypertension." The DASH eating plan is a healthy eating plan that has been shown to reduce high blood pressure (hypertension). It may also reduce your risk for type 2 diabetes, heart disease, and stroke. The DASH eating plan may also help with weight loss. What are tips for following this plan? General guidelines  Avoid eating more than 2,300 mg (milligrams) of salt (sodium) a day. If you have hypertension, you may need to reduce your sodium intake to 1,500 mg a day.  Limit alcohol intake to no more than 1 drink a day for nonpregnant women and 2 drinks a day for men. One drink equals 12 oz of beer, 5 oz of wine, or 1 oz of hard liquor.  Work with your health care provider to maintain a healthy body weight or to lose weight. Ask what an ideal weight is for you.  Get at least 30 minutes of exercise that causes your heart to beat faster (aerobic exercise) most days of the week. Activities may include walking, swimming, or biking.  Work with your health care provider or diet and nutrition specialist (dietitian) to adjust your eating plan to your individual calorie needs. Reading food labels  Check food labels for the amount of sodium per serving. Choose foods with less than 5 percent of the Daily Value of sodium. Generally, foods with less than 300 mg of sodium per serving fit into  this eating plan.  To find whole grains, look for the word "whole" as the first word in the ingredient list. Shopping  Buy products labeled as "low-sodium" or "no salt added."  Buy fresh foods. Avoid canned foods and premade or frozen meals. Cooking  Avoid adding salt when cooking. Use salt-free seasonings or herbs instead of table salt or sea salt. Check with your health care provider or pharmacist before using salt substitutes.  Do not fry foods. Cook foods using healthy methods such as baking, boiling, grilling, and broiling instead.  Cook with heart-healthy oils, such as olive, canola, soybean, or sunflower oil. Meal planning   Eat a balanced diet that includes: ? 5 or more servings of fruits and vegetables each day. At each meal, try to fill half of your plate with fruits and vegetables. ? Up to 6-8 servings of whole grains each day. ? Less than 6 oz of lean meat, poultry, or fish each day. A 3-oz serving of meat is about the same size as a deck of cards. One egg equals 1 oz. ? 2 servings of low-fat dairy each day. ? A serving of nuts, seeds, or beans 5 times each week. ? Heart-healthy fats. Healthy fats called Omega-3 fatty acids are found in foods such as flaxseeds and coldwater fish, like sardines, salmon, and mackerel.  Limit how much you eat of the following: ? Canned or prepackaged foods. ? Food that is high in trans fat, such as fried foods. ? Food that is  high in saturated fat, such as fatty meat. ? Sweets, desserts, sugary drinks, and other foods with added sugar. ? Full-fat dairy products.  Do not salt foods before eating.  Try to eat at least 2 vegetarian meals each week.  Eat more home-cooked food and less restaurant, buffet, and fast food.  When eating at a restaurant, ask that your food be prepared with less salt or no salt, if possible. What foods are recommended? The items listed may not be a complete list. Talk with your dietitian about what dietary  choices are best for you. Grains Whole-grain or whole-wheat bread. Whole-grain or whole-wheat pasta. Brown rice. [name] [name]. Bulgur. Whole-grain and low-sodium cereals. Pita bread. Low-fat, low-sodium crackers. Whole-wheat flour tortillas. Vegetables Fresh or frozen vegetables (raw, steamed, roasted, or grilled). Low-sodium or reduced-sodium tomato and vegetable juice. Low-sodium or reduced-sodium tomato sauce and tomato paste. Low-sodium or reduced-sodium canned vegetables. Fruits All fresh, dried, or frozen fruit. Canned fruit in natural juice (without added sugar). Meat and other protein foods Skinless chicken or [place]. Ground chicken or [place]. Pork with fat trimmed off. Fish and seafood. Egg whites. Dried beans, peas, or lentils. Unsalted nuts, nut butters, and seeds. Unsalted canned beans. Lean cuts of beef with fat trimmed off. Low-sodium, lean deli meat. Dairy Low-fat (1%) or fat-free (skim) milk. Fat-free, low-fat, or reduced-fat cheeses. Nonfat, low-sodium ricotta or cottage cheese. Low-fat or nonfat yogurt. Low-fat, low-sodium cheese. Fats and oils Soft margarine without trans fats. Vegetable oil. Low-fat, reduced-fat, or light mayonnaise and salad dressings (reduced-sodium). Canola, safflower, olive, soybean, and sunflower oils. Avocado. Seasoning and other foods Herbs. Spices. Seasoning mixes without salt. Unsalted popcorn and pretzels. Fat-free sweets. What foods are not recommended? The items listed may not be a complete list. Talk with your dietitian about what dietary choices are best for you. Grains Baked goods made with fat, such as croissants, muffins, or some breads. Dry pasta or rice meal packs. Vegetables Creamed or fried vegetables. Vegetables in a cheese sauce. Regular canned vegetables (not low-sodium or reduced-sodium). Regular canned tomato sauce and paste (not low-sodium or reduced-sodium). Regular tomato and vegetable juice (not low-sodium or reduced-sodium).  [name] [name]. Olives. Fruits Canned fruit in a light or heavy syrup. Fried fruit. Fruit in cream or butter sauce. Meat and other protein foods Fatty cuts of meat. Ribs. Fried meat. [name] [name]. Sausage. Bologna and other processed lunch meats. Salami. Fatback. Hotdogs. Bratwurst. Salted nuts and seeds. Canned beans with added salt. Canned or smoked fish. Whole eggs or egg yolks. Chicken or [place] with skin. Dairy Whole or 2% milk, cream, and half-and-half. Whole or full-fat cream cheese. Whole-fat or sweetened yogurt. Full-fat cheese. Nondairy creamers. Whipped toppings. Processed cheese and cheese spreads. Fats and oils Butter. Stick margarine. Lard. Shortening. Ghee. Bacon fat. Tropical oils, such as coconut, palm kernel, or palm oil. Seasoning and other foods Salted popcorn and pretzels. Onion salt, garlic salt, seasoned salt, table salt, and sea salt. Worcestershire sauce. Tartar sauce. Barbecue sauce. Teriyaki sauce. Soy sauce, including reduced-sodium. Steak sauce. Canned and packaged gravies. Fish sauce. Oyster sauce. Cocktail sauce. Horseradish that you find on the shelf. Ketchup. Mustard. Meat flavorings and tenderizers. Bouillon cubes. Hot sauce and Tabasco sauce. Premade or packaged marinades. Premade or packaged taco seasonings. Relishes. Regular salad dressings. Where to find more information:  National Heart, Lung, and [place]: https://wilson-eaton.com/  American Heart Association: www.heart.org Summary  The DASH eating plan is a healthy eating plan that has been shown to reduce high blood pressure (hypertension). It  may also reduce your risk for type 2 diabetes, heart disease, and stroke.  With the DASH eating plan, you should limit salt (sodium) intake to 2,300 mg a day. If you have hypertension, you may need to reduce your sodium intake to 1,500 mg a day.  When on the DASH eating plan, aim to eat more fresh fruits and vegetables, whole grains, lean proteins, low-fat dairy, and  heart-healthy fats.  Work with your health care provider or diet and nutrition specialist (dietitian) to adjust your eating plan to your individual calorie needs. This information is not intended to replace advice given to you by your health care provider. Make sure you discuss any questions you have with your health care provider. Document Released: 07/09/2011 Document Revised: 07/13/2016 Document Reviewed: 07/13/2016 Elsevier Interactive Patient Education  [employer].    If you need a refill on your cardiac medications before your next appointment, please call your pharmacy.

## 2018-04-25 NOTE — Progress Notes (Signed)
Cardiology Office Note:    Date:  04/25/2018   ID:  CARLES FLOREA, DOB 1951/03/15, MRN [id number]  PCP:  Gordan Payment., MD  Cardiologist:  [name], MD  Referring MD: Gordan Payment., MD   Chief Complaint  Patient presents with  . Follow-up    CAD    History of Present Illness:    [name] [name] is a 67 y.o. 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 He's been maintained on long term DAPT.  He also has a history of hyperlipidemia, hypertension, GERD and chronic pain syndrome  Per cath in 2014 he has patent LIMA to LAD but has distal vessel disease pattern without good target for intervention. Risk factor modification.   He is here today alone for routine follow up. He is retired from disability due to his back and heart. He lives with his wife of 48 years. He drives and does shopping, housework and yard work. He goes to a gym 3-4 days per week with his grandson. He does ~45 minutes on TM and/or bike. No exertional pain/pressue/tightness or shortness of breath. He only gets fatigued after being on the TM for an hour.   He does not smoke. His diet is not too bad. He eats oatmeal for breakfast and eats salads, very little fast food. No palpitations, orthopnea, PND or edema.   Past Medical History:  Diagnosis Date  . CAD (coronary artery disease)    CABG  . Chronic pain syndrome   . GERD (gastroesophageal reflux disease)   . Hyperlipidemia   . Hypertension   . Radiculitis of right cervical region 09/04/2015    Past Surgical History:  Procedure Laterality Date  . CHOLECYSTECTOMY    . CORONARY ARTERY BYPASS GRAFT     multivessel  . HERNIA REPAIR    . LEFT HEART CATHETERIZATION WITH CORONARY/GRAFT ANGIOGRAM N/A 04/21/2013   Procedure: LEFT HEART CATHETERIZATION WITH [name];  Surgeon: Micheline Chapman, MD;  Location: [facility];  Service: Cardiovascular;   Laterality: N/A;  . PCI 2009 with stenting of LIMA-LAD anastamosis      Current Medications: Current Meds  Medication Sig  . aspirin 81 MG tablet Take 1 tablet (81 mg total) by mouth daily.  Marland Kitchen atorvastatin (LIPITOR) 20 MG tablet TAKE 1 TABLET BY MOUTH EVERY DAY  . Cholecalciferol (VITAMIN D3) 2000 units capsule Take 1 capsule (2,000 Units total) by mouth daily.  . clopidogrel (PLAVIX) 75 MG tablet TAKE 1 TABLET BY MOUTH EVERY DAY  . gabapentin (NEURONTIN) 300 MG capsule Take 1-3 capsules (300-900 mg total) by mouth 4 (four) times daily.  . hydrochlorothiazide (HYDRODIURIL) 25 MG tablet TAKE 1 TABLET BY MOUTH EVERY DAY  . isosorbide mononitrate (IMDUR) 30 MG 24 hr tablet TAKE 1 TABLET (30 MG TOTAL) BY MOUTH DAILY.  . magnesium oxide (MAG-OX) 400 MG tablet Take 1 tablet (400 mg total) by mouth daily.  . metoprolol succinate (TOPROL-XL) 50 MG 24 hr tablet TAKE 1 TABLET BY MOUTH EVERY DAY WITH OR IMMEDIATELY FOLLOWING A MEAL  . nitroGLYCERIN (NITROSTAT) 0.4 MG SL tablet Place 1 tablet (0.4 mg total) under the tongue every 5 (five) minutes as needed for chest pain.  [name] ON 05/12/2018] Oxycodone HCl 10 MG TABS Take 1 tablet (10 mg total) by mouth 5 (five) times daily as needed (pain).  . pantoprazole (PROTONIX) 40 MG tablet TAKE 1 TABLET BY  MOUTH EVERY DAY  . potassium chloride SA (K-DUR,KLOR-CON) 20 MEQ tablet Take 20 mEq by mouth daily.  . vitamin B-12 (CYANOCOBALAMIN) 1000 MCG tablet Take 2,000 mcg by mouth daily.     Allergies:   Patient has no known allergies.   Social History   Socioeconomic History  . Marital status: Married    Spouse name: Not on file  . Number of children: Not on file  . Years of education: Not on file  . Highest education level: Not on file  Occupational History  . Not on file  Social Needs  . Financial resource strain: Not on file  . Food insecurity:    Worry: Not on file    Inability: Not on file  . Transportation needs:    Medical: Not on file     Non-medical: Not on file  Tobacco Use  . Smoking status: Former Games developer  . Smokeless tobacco: Never Used  Substance and Sexual Activity  . Alcohol use: No    Alcohol/week: 0.0 standard drinks  . Drug use: No  . Sexual activity: Not on file  Lifestyle  . Physical activity:    Days per week: Not on file    Minutes per session: Not on file  . Stress: Not on file  Relationships  . Social connections:    Talks on phone: Not on file    Gets together: Not on file    Attends religious service: Not on file    Active member of club or organization: Not on file    Attends meetings of clubs or organizations: Not on file    Relationship status: Not on file  Other Topics Concern  . Not on file  Social History Narrative  . Not on file     Family History: The patient's family history includes Multiple sclerosis in his mother; Stroke in his father. ROS:   Please see the history of present illness.     All other systems reviewed and are negative.  EKGs/Labs/Other Studies Reviewed:    The following studies were reviewed today:  Echocardiogram 12/07/2013 Study Conclusions - Left ventricle: The cavity size was normal. There was mild concentric hypertrophy. Systolic function was normal. The estimated ejection fraction was in the range of 60% to 65%. Wall motion was normal; there were no regional wall motion abnormalities. There is diastolic dysfunction with indeterminate filling pressure. - Aortic valve: Trileaflet. Sclerosis without stenosis. Trivial regurgitation. - Left atrium: The atrium was at the upper limits of normal in size. - Right ventricle: The cavity size was normal. Wall thickness was normal. Systolic function was normal. RV systolic pressure: 46mm Hg (S, est). - Right atrium: The atrium was mildly dilated. Central venous pressure: 3mm Hg (est). - Tricuspid valve: Moderate regurgitation. - Pulmonary arteries: PA peak pressure: 46mm Hg (S). - Inferior  vena cava: The vessel was dilated; the respirophasic diameter changes were blunted (< 50%); findings are consistent with elevated central venous pressure.  Left heart catheterization 04/21/2013 Final Conclusions:   1. Severe native three-vessel coronary artery disease as outlined. There is a diffuse distal vessel disease pattern without good targets for revascularization. 2. Continued patency of the LIMA to LAD with moderately severe diffuse disease in the distal LAD beyond the LIMA insertion site  Recommendations: I think ongoing medical therapy as her only option for treatment. Because of his diffuse distal disease pattern he is not a candidate for further revascularization. [name] 04/21/2013, 3:18 PM  EKG:  EKG is not ordered  today.    Recent Labs: No results found for requested labs within last 8760 hours.   Recent Lipid Panel    Component Value Date/Time   CHOL 160 06/18/2016 0954   TRIG 93 06/18/2016 0954   HDL 42 06/18/2016 0954   CHOLHDL 3.8 06/18/2016 0954   VLDL 19 06/18/2016 0954   LDLCALC 99 06/18/2016 0954   LDLDIRECT 92.7 07/04/2009 0946    Physical Exam:    VS:  BP 120/60   Pulse 71   Ht 5\' 8"  (1.727 m)   Wt 190 lb (86.2 kg)   BMI 28.89 kg/m     Wt Readings from Last 3 Encounters:  04/25/18 190 lb (86.2 kg)  03/01/18 194 lb 14.4 oz (88.4 kg)  11/30/17 196 lb (88.9 kg)     Physical Exam  Constitutional: He is oriented to person, place, and time. He appears well-developed and well-nourished. No distress.  HENT:  Head: Normocephalic and atraumatic.  Neck: Normal range of motion. Neck supple. No JVD present.  Cardiovascular: Normal rate, regular rhythm, normal heart sounds and intact distal pulses. Exam reveals no gallop and no friction rub.  No murmur heard. Pulmonary/Chest: Effort normal and breath sounds normal. No respiratory distress. He has no wheezes. He has no rales.  Abdominal: Soft. Bowel sounds are normal.  Musculoskeletal:  Normal range of motion. He exhibits no edema or deformity.  Neurological: He is alert and oriented to person, place, and time.  Skin: Skin is warm and dry.  Psychiatric: He has a normal mood and affect. His behavior is normal. Judgment and thought content normal.  Vitals reviewed.   ASSESSMENT:    1. CAD in native artery   2. Essential hypertension   3. Pure hypercholesterolemia    PLAN:    In order of problems listed above:  CAD: S/P CABG with early graft failure requiring PCI. Per cath in 2014 he has patent LIMA to LAD but has distal vessel disease pattern without good target for intervention. Risk factor modification.  Continue aspirin, statin, Plavix, Imdur, beta-blocker. Tolerating meds well. Is exercising and no exertional symptoms. Continue current therapy.  Continue heart healthy diet and exercise.   Hypertension: BP well controlled. He often forgets his KDur. Last BMet in 08/2017. Will update CMet   Hyperlipidemia: On atorvastatin 20 mg daily. LDL goal <70. No recent lipid panel, will update lipids.   Medication Adjustments/Labs and Tests Ordered: Current medicines are reviewed at length with the patient today.  Concerns regarding medicines are outlined above. Labs and tests ordered and medication changes are outlined in the patient instructions below:  Patient Instructions  Medication Instructions: Your physician recommends that you continue on your current medications as directed. Please refer to the Current Medication list given to you today.   Labwork: TODAY: CMET & LIPIDS   Procedures/Testing: None  Follow-Up: Your physician wants you to follow-up in: 6 months with Dr. Theodoro Parma will receive a reminder letter in the mail two months in advance. If you don't receive a letter, please call our office to schedule the follow-up appointment.   Any Additional Special Instructions Will Be Listed Below (If Applicable).   DASH Eating Plan DASH stands for "Dietary  Approaches to Stop Hypertension." The DASH eating plan is a healthy eating plan that has been shown to reduce high blood pressure (hypertension). It may also reduce your risk for type 2 diabetes, heart disease, and stroke. The DASH eating plan may also help with weight loss. What are tips  for following this plan? General guidelines  Avoid eating more than 2,300 mg (milligrams) of salt (sodium) a day. If you have hypertension, you may need to reduce your sodium intake to 1,500 mg a day.  Limit alcohol intake to no more than 1 drink a day for nonpregnant women and 2 drinks a day for men. One drink equals 12 oz of beer, 5 oz of wine, or 1 oz of hard liquor.  Work with your health care provider to maintain a healthy body weight or to lose weight. Ask what an ideal weight is for you.  Get at least 30 minutes of exercise that causes your heart to beat faster (aerobic exercise) most days of the week. Activities may include walking, swimming, or biking.  Work with your health care provider or diet and nutrition specialist (dietitian) to adjust your eating plan to your individual calorie needs. Reading food labels  Check food labels for the amount of sodium per serving. Choose foods with less than 5 percent of the Daily Value of sodium. Generally, foods with less than 300 mg of sodium per serving fit into this eating plan.  To find whole grains, look for the word "whole" as the first word in the ingredient list. Shopping  Buy products labeled as "low-sodium" or "no salt added."  Buy fresh foods. Avoid canned foods and premade or frozen meals. Cooking  Avoid adding salt when cooking. Use salt-free seasonings or herbs instead of table salt or sea salt. Check with your health care provider or pharmacist before using salt substitutes.  Do not fry foods. Cook foods using healthy methods such as baking, boiling, grilling, and broiling instead.  Cook with heart-healthy oils, such as olive, canola,  soybean, or sunflower oil. Meal planning   Eat a balanced diet that includes: ? 5 or more servings of fruits and vegetables each day. At each meal, try to fill half of your plate with fruits and vegetables. ? Up to 6-8 servings of whole grains each day. ? Less than 6 oz of lean meat, poultry, or fish each day. A 3-oz serving of meat is about the same size as a deck of cards. One egg equals 1 oz. ? 2 servings of low-fat dairy each day. ? A serving of nuts, seeds, or beans 5 times each week. ? Heart-healthy fats. Healthy fats called Omega-3 fatty acids are found in foods such as flaxseeds and coldwater fish, like sardines, salmon, and mackerel.  Limit how much you eat of the following: ? Canned or prepackaged foods. ? Food that is high in trans fat, such as fried foods. ? Food that is high in saturated fat, such as fatty meat. ? Sweets, desserts, sugary drinks, and other foods with added sugar. ? Full-fat dairy products.  Do not salt foods before eating.  Try to eat at least 2 vegetarian meals each week.  Eat more home-cooked food and less restaurant, buffet, and fast food.  When eating at a restaurant, ask that your food be prepared with less salt or no salt, if possible. What foods are recommended? The items listed may not be a complete list. Talk with your dietitian about what dietary choices are best for you. Grains Whole-grain or whole-wheat bread. Whole-grain or whole-wheat pasta. Brown rice. Orpah Cobb. Bulgur. Whole-grain and low-sodium cereals. Pita bread. Low-fat, low-sodium crackers. Whole-wheat flour tortillas. Vegetables Fresh or frozen vegetables (raw, steamed, roasted, or grilled). Low-sodium or reduced-sodium tomato and vegetable juice. Low-sodium or reduced-sodium tomato sauce and tomato paste.  Low-sodium or reduced-sodium canned vegetables. Fruits All fresh, dried, or frozen fruit. Canned fruit in natural juice (without added sugar). Meat and other protein  foods Skinless chicken or [place]. Ground chicken or [place]. Pork with fat trimmed off. Fish and seafood. Egg whites. Dried beans, peas, or lentils. Unsalted nuts, nut butters, and seeds. Unsalted canned beans. Lean cuts of beef with fat trimmed off. Low-sodium, lean deli meat. Dairy Low-fat (1%) or fat-free (skim) milk. Fat-free, low-fat, or reduced-fat cheeses. Nonfat, low-sodium ricotta or cottage cheese. Low-fat or nonfat yogurt. Low-fat, low-sodium cheese. Fats and oils Soft margarine without trans fats. Vegetable oil. Low-fat, reduced-fat, or light mayonnaise and salad dressings (reduced-sodium). Canola, safflower, olive, soybean, and sunflower oils. Avocado. Seasoning and other foods Herbs. Spices. Seasoning mixes without salt. Unsalted popcorn and pretzels. Fat-free sweets. What foods are not recommended? The items listed may not be a complete list. Talk with your dietitian about what dietary choices are best for you. Grains Baked goods made with fat, such as croissants, muffins, or some breads. Dry pasta or rice meal packs. Vegetables Creamed or fried vegetables. Vegetables in a cheese sauce. Regular canned vegetables (not low-sodium or reduced-sodium). Regular canned tomato sauce and paste (not low-sodium or reduced-sodium). Regular tomato and vegetable juice (not low-sodium or reduced-sodium). Rosita Fire. Olives. Fruits Canned fruit in a light or heavy syrup. Fried fruit. Fruit in cream or butter sauce. Meat and other protein foods Fatty cuts of meat. Ribs. Fried meat. Tomasa Blase. Sausage. Bologna and other processed lunch meats. Salami. Fatback. Hotdogs. Bratwurst. Salted nuts and seeds. Canned beans with added salt. Canned or smoked fish. Whole eggs or egg yolks. Chicken or [place] with skin. Dairy Whole or 2% milk, cream, and half-and-half. Whole or full-fat cream cheese. Whole-fat or sweetened yogurt. Full-fat cheese. Nondairy creamers. Whipped toppings. Processed cheese and cheese  spreads. Fats and oils Butter. Stick margarine. Lard. Shortening. Ghee. Bacon fat. Tropical oils, such as coconut, palm kernel, or palm oil. Seasoning and other foods Salted popcorn and pretzels. Onion salt, garlic salt, seasoned salt, table salt, and sea salt. Worcestershire sauce. Tartar sauce. Barbecue sauce. Teriyaki sauce. Soy sauce, including reduced-sodium. Steak sauce. Canned and packaged gravies. Fish sauce. Oyster sauce. Cocktail sauce. Horseradish that you find on the shelf. Ketchup. Mustard. Meat flavorings and tenderizers. Bouillon cubes. Hot sauce and Tabasco sauce. Premade or packaged marinades. Premade or packaged taco seasonings. Relishes. Regular salad dressings. Where to find more information:  National Heart, Lung, and Blood Institute: PopSteam.is  American Heart Association: www.heart.org Summary  The DASH eating plan is a healthy eating plan that has been shown to reduce high blood pressure (hypertension). It may also reduce your risk for type 2 diabetes, heart disease, and stroke.  With the DASH eating plan, you should limit salt (sodium) intake to 2,300 mg a day. If you have hypertension, you may need to reduce your sodium intake to 1,500 mg a day.  When on the DASH eating plan, aim to eat more fresh fruits and vegetables, whole grains, lean proteins, low-fat dairy, and heart-healthy fats.  Work with your health care provider or diet and nutrition specialist (dietitian) to adjust your eating plan to your individual calorie needs. This information is not intended to replace advice given to you by your health care provider. Make sure you discuss any questions you have with your health care provider. Document Released: 07/09/2011 Document Revised: 07/13/2016 Document Reviewed: 07/13/2016 Elsevier Interactive Patient Education  [employer].    If you need a refill on  your cardiac medications before your next appointment, please call your pharmacy.       Signed, Berton Bon, NP  04/25/2018 10:23 AM    [facility]

## 2018-04-26 ENCOUNTER — Other Ambulatory Visit: Payer: Self-pay

## 2018-04-26 ENCOUNTER — Telehealth: Payer: Self-pay

## 2018-04-26 DIAGNOSIS — I10 Essential (primary) hypertension: Secondary | ICD-10-CM

## 2018-04-26 NOTE — Telephone Encounter (Signed)
Informed patient of results and verbal understanding expressed.  Patient has not been taking his K 20 meq daily. Instructed patient to take K 40 meq today and tomorrow then 20 meq daily after that. He will have repeat BMET drawn next week at [facility]. He was grateful for call and agrees with treatment plan.

## 2018-05-04 LAB — BASIC METABOLIC PANEL
BUN/Creatinine Ratio: 10 (ref 10–24)
BUN: 9 mg/dL (ref 8–27)
CALCIUM: 9.3 mg/dL (ref 8.6–10.2)
CO2: 28 mmol/L (ref 20–29)
CREATININE: 0.93 mg/dL (ref 0.76–1.27)
Chloride: 101 mmol/L (ref 96–106)
GFR, EST AFRICAN AMERICAN: 99 mL/min/{1.73_m2} (ref >59)
GFR, EST NON AFRICAN AMERICAN: 85 mL/min/{1.73_m2} (ref >59)
Glucose: 114 mg/dL — ABNORMAL HIGH (ref 65–99)
POTASSIUM: 4.2 mmol/L (ref 3.5–5.2)
Sodium: 139 mmol/L (ref 134–144)

## 2018-05-12 ENCOUNTER — Telehealth: Payer: Self-pay | Admitting: Cardiovascular Disease

## 2018-05-12 NOTE — Telephone Encounter (Signed)
-----   Message from [name], MD sent at 05/10/2018  4:32 PM EDT ----- Lab normal

## 2018-05-12 NOTE — Telephone Encounter (Signed)
Follow Up: ° ° °Returning your call,concerning his lab results. °

## 2018-05-12 NOTE — Telephone Encounter (Signed)
Informed patient of results and verbal understanding expressed.  Scheduled patient for 6 mo OV with Dr. Excell Seltzer 10/27/18. He was grateful for call and agrees with treatment plan.

## 2018-05-25 ENCOUNTER — Encounter: Payer: Self-pay | Admitting: Nurse Practitioner

## 2018-05-25 ENCOUNTER — Ambulatory Visit
Admission: RE | Admit: 2018-05-25 | Discharge: 2018-05-25 | Disposition: A | Discharge status: Home / Self Care | Payer: Medicare Other | Source: Ambulatory Visit | Attending: Nurse Practitioner | Admitting: Nurse Practitioner

## 2018-05-25 ENCOUNTER — Ambulatory Visit: Payer: Medicare Other | Admitting: Nurse Practitioner

## 2018-05-25 VITALS — BP 123/80 | HR 64 | Temp 97.9°F | Resp 16 | Ht 68.0 in | Wt 195.0 lb

## 2018-05-25 DIAGNOSIS — M47816 Spondylosis without myelopathy or radiculopathy, lumbar region: Secondary | ICD-10-CM | POA: Diagnosis not present

## 2018-05-25 DIAGNOSIS — E876 Hypokalemia: Secondary | ICD-10-CM | POA: Insufficient documentation

## 2018-05-25 DIAGNOSIS — I951 Orthostatic hypotension: Secondary | ICD-10-CM | POA: Insufficient documentation

## 2018-05-25 DIAGNOSIS — M5412 Radiculopathy, cervical region: Principal | ICD-10-CM

## 2018-05-25 DIAGNOSIS — G8929 Other chronic pain: Secondary | ICD-10-CM | POA: Diagnosis present

## 2018-05-25 DIAGNOSIS — G894 Chronic pain syndrome: Secondary | ICD-10-CM | POA: Insufficient documentation

## 2018-05-25 DIAGNOSIS — Z79899 Other long term (current) drug therapy: Secondary | ICD-10-CM | POA: Insufficient documentation

## 2018-05-25 DIAGNOSIS — M5137 Other intervertebral disc degeneration, lumbosacral region: Secondary | ICD-10-CM | POA: Insufficient documentation

## 2018-05-25 DIAGNOSIS — M25552 Pain in left hip: Secondary | ICD-10-CM

## 2018-05-25 DIAGNOSIS — M79605 Pain in left leg: Secondary | ICD-10-CM

## 2018-05-25 DIAGNOSIS — M5416 Radiculopathy, lumbar region: Secondary | ICD-10-CM | POA: Diagnosis not present

## 2018-05-25 DIAGNOSIS — B182 Chronic viral hepatitis C: Secondary | ICD-10-CM | POA: Insufficient documentation

## 2018-05-25 DIAGNOSIS — M542 Cervicalgia: Secondary | ICD-10-CM | POA: Insufficient documentation

## 2018-05-25 DIAGNOSIS — Z951 Presence of aortocoronary bypass graft: Secondary | ICD-10-CM | POA: Insufficient documentation

## 2018-05-25 DIAGNOSIS — M25551 Pain in right hip: Secondary | ICD-10-CM

## 2018-05-25 DIAGNOSIS — E559 Vitamin D deficiency, unspecified: Secondary | ICD-10-CM

## 2018-05-25 DIAGNOSIS — Z79891 Long term (current) use of opiate analgesic: Secondary | ICD-10-CM | POA: Insufficient documentation

## 2018-05-25 DIAGNOSIS — M545 Low back pain: Secondary | ICD-10-CM | POA: Insufficient documentation

## 2018-05-25 DIAGNOSIS — R7303 Prediabetes: Secondary | ICD-10-CM

## 2018-05-25 DIAGNOSIS — Z7982 Long term (current) use of aspirin: Secondary | ICD-10-CM

## 2018-05-25 DIAGNOSIS — M79602 Pain in left arm: Secondary | ICD-10-CM | POA: Insufficient documentation

## 2018-05-25 DIAGNOSIS — M4726 Other spondylosis with radiculopathy, lumbar region: Secondary | ICD-10-CM | POA: Insufficient documentation

## 2018-05-25 DIAGNOSIS — K219 Gastro-esophageal reflux disease without esophagitis: Secondary | ICD-10-CM | POA: Insufficient documentation

## 2018-05-25 DIAGNOSIS — M79604 Pain in right leg: Secondary | ICD-10-CM | POA: Insufficient documentation

## 2018-05-25 DIAGNOSIS — E669 Obesity, unspecified: Secondary | ICD-10-CM | POA: Insufficient documentation

## 2018-05-25 DIAGNOSIS — I10 Essential (primary) hypertension: Secondary | ICD-10-CM

## 2018-05-25 DIAGNOSIS — E782 Mixed hyperlipidemia: Secondary | ICD-10-CM | POA: Insufficient documentation

## 2018-05-25 DIAGNOSIS — I252 Old myocardial infarction: Secondary | ICD-10-CM

## 2018-05-25 DIAGNOSIS — M5481 Occipital neuralgia: Secondary | ICD-10-CM

## 2018-05-25 DIAGNOSIS — Z7901 Long term (current) use of anticoagulants: Secondary | ICD-10-CM

## 2018-05-25 DIAGNOSIS — Z7902 Long term (current) use of antithrombotics/antiplatelets: Secondary | ICD-10-CM | POA: Insufficient documentation

## 2018-05-25 DIAGNOSIS — M792 Neuralgia and neuritis, unspecified: Secondary | ICD-10-CM

## 2018-05-25 MED ORDER — OXYCODONE HCL 10 MG PO TABS: 10.0000 mg | ORAL_TABLET | Freq: Every day | ORAL | 0 refills | Status: DC | PRN

## 2018-05-25 MED ORDER — VITAMIN D3 50 MCG (2000 UT) PO CAPS: ORAL_CAPSULE | ORAL | 99 refills | Status: DC

## 2018-05-25 MED ORDER — GABAPENTIN 300 MG PO CAPS: 300.0000 mg | ORAL_CAPSULE | Freq: Four times a day (QID) | ORAL | 2 refills | Status: DC

## 2018-05-25 NOTE — Patient Instructions (Addendum)
____________________________________________________________________________________________  Medication Rules  Applies to: All patients receiving prescriptions (written or electronic).  Pharmacy of record: Pharmacy where electronic prescriptions will be sent. If written prescriptions are taken to a different pharmacy, please inform the nursing staff. The pharmacy listed in the electronic medical record should be the one where you would like electronic prescriptions to be sent.  Prescription refills: Only during scheduled appointments. Applies to both, written and electronic prescriptions.  NOTE: The following applies primarily to controlled substances (Opioid* Pain Medications).   Patient's responsibilities: 1. Pain Pills: Bring all pain pills to every appointment (except for procedure appointments). 2. Pill Bottles: Bring pills in original pharmacy bottle. Always bring newest bottle. Bring bottle, even if empty. 3. Medication refills: You are responsible for knowing and keeping track of what medications you need refilled. The day before your appointment, write a list of all prescriptions that need to be refilled. Bring that list to your appointment and give it to the admitting nurse. Prescriptions will be written only during appointments. If you forget a medication, it will not be "Called in", "Faxed", or "electronically sent". You will need to get another appointment to get these prescribed. 4. Prescription Accuracy: You are responsible for carefully inspecting your prescriptions before leaving our office. Have the discharge nurse carefully go over each prescription with you, before taking them home. Make sure that your name is accurately spelled, that your address is correct. Check the name and dose of your medication to make sure it is accurate. Check the number of pills, and the written instructions to make sure they are clear and accurate. Make sure that you are given enough medication to last  until your next medication refill appointment. 5. Taking Medication: Take medication as prescribed. Never take more pills than instructed. Never take medication more frequently than prescribed. Taking less pills or less frequently is permitted and encouraged, when it comes to controlled substances (written prescriptions).  6. Inform other Doctors: Always inform, all of your healthcare providers, of all the medications you take. 7. Pain Medication from other Providers: You are not allowed to accept any additional pain medication from any other Doctor or Healthcare provider. There are two exceptions to this rule. (see below) In the event that you require additional pain medication, you are responsible for notifying us, as stated below. 8. Medication Agreement: You are responsible for carefully reading and following our Medication Agreement. This must be signed before receiving any prescriptions from our practice. Safely store a copy of your signed Agreement. Violations to the Agreement will result in no further prescriptions. (Additional copies of our Medication Agreement are available upon request.) 9. Laws, Rules, & Regulations: All patients are expected to follow all Federal and State Laws, Statutes, Rules, & Regulations. Ignorance of the Laws does not constitute a valid excuse. The use of any illegal substances is prohibited. 10. Adopted CDC guidelines & recommendations: Target dosing levels will be at or below 60 MME/day. Use of benzodiazepines** is not recommended.  Exceptions: There are only two exceptions to the rule of not receiving pain medications from other Healthcare Providers. 1. Exception #1 (Emergencies): In the event of an emergency (i.e.: accident requiring emergency care), you are allowed to receive additional pain medication. However, you are responsible for: As soon as you are able, call our office [phone], at any time of the day or night, and leave a message stating your name, the  date and nature of the emergency, and the name and dose of the medication   prescribed. In the event that your call is answered by a member of our staff, make sure to document and save the date, time, and the name of the person that took your information.  2. Exception #2 (Planned Surgery): In the event that you are scheduled by another doctor or dentist to have any type of surgery or procedure, you are allowed (for a period no longer than 30 days), to receive additional pain medication, for the acute post-op pain. However, in this case, you are responsible for picking up a copy of our "Post-op Pain Management for Surgeons" handout, and giving it to your surgeon or dentist. This document is available at our office, and does not require an appointment to obtain it. Simply go to our office during business hours (Monday-Thursday from 8:00 AM to 4:00 PM) (Friday 8:00 AM to 12:00 Noon) or if you have a scheduled appointment with [place], prior to your surgery, and ask for it by name. In addition, you will need to provide [place] with your name, name of your surgeon, type of surgery, and date of procedure or surgery.  *Opioid medications include: morphine, codeine, oxycodone, oxymorphone, hydrocodone, hydromorphone, meperidine, tramadol, tapentadol, buprenorphine, fentanyl, methadone. **Benzodiazepine medications include: diazepam (Valium), alprazolam (Xanax), clonazepam (Klonopine), lorazepam (Ativan), clorazepate (Tranxene), chlordiazepoxide (Librium), estazolam (Prosom), oxazepam (Serax), temazepam (Restoril), triazolam (Halcion) (Last updated: 09/30/2017) ____________________________________________________________________________________________   Vitamin D3 and gabapentin 300 mg escribed to pharmacy  oxycocodone 10 mg x 3 months to begin filling 06/12/18 escribed to pharmacy

## 2018-05-25 NOTE — Progress Notes (Signed)
Nursing Pain Medication Assessment:  Safety precautions to be maintained throughout the outpatient stay will include: orient to surroundings, keep bed in low position, maintain call bell within reach at all times, provide assistance with transfer out of bed and ambulation.  Medication Inspection Compliance: Pill count conducted under aseptic conditions, in front of the patient. Neither the pills nor the bottle was removed from the patient's sight at any time. Once count was completed pills were immediately returned to the patient in their original bottle.  Medication: Oxycodone IR Pill/Patch Count: 92 of 150 pills remain Pill/Patch Appearance: Markings consistent with prescribed medication Bottle Appearance: Standard pharmacy container. Clearly labeled. Filled Date: 10 / 11 / 2019 Last Medication intake:  Today

## 2018-05-25 NOTE — Progress Notes (Signed)
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 prescriptions (written or electronic).  Pharmacy of record: Pharmacy where electronic prescriptions will be sent. If written prescriptions are taken to a different pharmacy, please inform the nursing staff. The pharmacy listed in the electronic medical record should be the one where you would like electronic prescriptions to be sent.  Prescription refills: Only during scheduled appointments. Applies to both, written and electronic prescriptions.  NOTE: The following applies primarily to controlled substances (Opioid* Pain Medications).   Patient's responsibilities: 1. Pain Pills: Bring all pain pills to every appointment (except for procedure appointments). 2. Pill Bottles: Bring pills in original pharmacy bottle. Always bring newest bottle. Bring bottle, even if empty. 3. Medication refills: You are responsible  for knowing and keeping track of what medications you need refilled. The day before your appointment, write a list of all prescriptions that need to be refilled. Bring that list to your appointment and give it to the admitting nurse. Prescriptions will be written only during appointments. If you forget a medication, it will not be "Called in", "Faxed", or "electronically sent". You will need to get another appointment to get these prescribed. 4. Prescription Accuracy: You are responsible for carefully inspecting your prescriptions before leaving our office. Have the discharge nurse carefully go over each prescription with you, before taking them home. Make sure that your name is accurately spelled, that your address is correct. Check the name and dose of your medication to make sure it is accurate. Check the number of pills, and the written instructions to make sure they are clear and accurate. Make sure that you are given enough medication to last until your next medication refill appointment. 5. Taking Medication: Take medication as prescribed. Never take more pills than instructed. Never take medication more frequently than prescribed. Taking less pills or less frequently is permitted and encouraged, when it comes to controlled substances (written prescriptions).  6. Inform other Doctors: Always inform, all of your healthcare providers, of  all the medications you take. 7. Pain Medication from other Providers: You are not allowed to accept any additional pain medication from any other Doctor or Healthcare provider. There are two exceptions to this rule. (see below) In the event that you require additional pain medication, you are responsible for notifying us, as stated below. 8. Medication Agreement: You are responsible for carefully reading and following our Medication Agreement. This must be signed before receiving any prescriptions from our practice. Safely store a copy of your signed Agreement. Violations  to the Agreement will result in no further prescriptions. (Additional copies of our Medication Agreement are available upon request.) 9. Laws, Rules, & Regulations: All patients are expected to follow all Federal and [employer], [employer], Rules, [employer]. Ignorance of the Laws does not constitute a valid excuse. The use of any illegal substances is prohibited. 10. Adopted CDC guidelines & recommendations: Target dosing levels will be at or below 60 MME/day. Use of benzodiazepines** is not recommended.  Exceptions: There are only two exceptions to the rule of not receiving pain medications from other Healthcare Providers. 1. Exception #1 (Emergencies): In the event of an emergency (i.e.: accident requiring emergency care), you are allowed to receive additional pain medication. However, you are responsible for: As soon as you are able, call our office [phone], at any time of the day or night, and leave a message stating your name, the date and nature of the emergency, and the name and dose of the medication prescribed. In the event that your call is answered by a member of our staff, make sure to document and save the date, time, and the name of the person that took your information.  2. Exception #2 (Planned Surgery): In the event that you are scheduled by another doctor or dentist to have any type of surgery or procedure, you are allowed (for a period no longer than 30 days), to receive additional pain medication, for the acute post-op pain. However, in this case, you are responsible for picking up a copy of our "Post-op Pain Management for Surgeons" handout, and giving it to your surgeon or dentist. This document is available at our office, and does not require an appointment to obtain it. Simply go to our office during business hours (Monday-Thursday from 8:00 AM to 4:00 PM) (Friday 8:00 AM to 12:00 Noon) or if you have a scheduled appointment with [place], prior to your surgery, and ask for it by  name. In addition, you will need to provide [place] with your name, name of your surgeon, type of surgery, and date of procedure or surgery.  *Opioid medications include: morphine, codeine, oxycodone, oxymorphone, hydrocodone, hydromorphone, meperidine, tramadol, tapentadol, buprenorphine, fentanyl, methadone. **Benzodiazepine medications include: diazepam (Valium), alprazolam (Xanax), clonazepam (Klonopine), lorazepam (Ativan), clorazepate (Tranxene), chlordiazepoxide (Librium), estazolam (Prosom), oxazepam (Serax), temazepam (Restoril), triazolam (Halcion) (Last updated: 09/30/2017) ____________________________________________________________________________________________   Vitamin D3 and gabapentin 300 mg escribed to pharmacy  oxycocodone 10 mg x 3 months to begin filling 06/12/18 escribed to pharmacy

## 2018-05-26 NOTE — Progress Notes (Signed)
Results were reviewed and found to be: abnormal  No acute injury or pathology identified  Review would suggest interventional pain management techniques may be of benefit

## 2018-05-28 LAB — TOXASSURE SELECT 13 (MW), URINE

## 2018-06-15 ENCOUNTER — Other Ambulatory Visit: Payer: Self-pay | Admitting: Cardiovascular Disease

## 2018-06-15 DIAGNOSIS — E785 Hyperlipidemia, unspecified: Secondary | ICD-10-CM

## 2018-06-15 DIAGNOSIS — I25810 Atherosclerosis of coronary artery bypass graft(s) without angina pectoris: Secondary | ICD-10-CM

## 2018-06-15 DIAGNOSIS — I10 Essential (primary) hypertension: Secondary | ICD-10-CM

## 2018-06-16 ENCOUNTER — Telehealth: Payer: Self-pay | Admitting: Nurse Practitioner

## 2018-06-16 NOTE — Telephone Encounter (Signed)
Pharmacy called asking to fill gabapentin for 90 supply. Patient takes 3 tabs 4 times a day. Pharmacist [name] [name] says system is making her call and ask for this? Please let them know

## 2018-06-16 NOTE — Telephone Encounter (Signed)
30 day supply We can potentially adjust the dose when he returns

## 2018-06-28 ENCOUNTER — Other Ambulatory Visit: Payer: Self-pay | Admitting: Cardiovascular Disease

## 2018-08-13 ENCOUNTER — Other Ambulatory Visit: Payer: Self-pay | Admitting: Cardiovascular Disease

## 2018-08-17 ENCOUNTER — Telehealth: Payer: Self-pay | Admitting: Cardiovascular Disease

## 2018-08-17 NOTE — Telephone Encounter (Signed)
New Message   PT is sick with flu-like symptoms and is wondering what over the counter medication is okay to take Please call

## 2018-08-17 NOTE — Telephone Encounter (Signed)
Recommended Coricidin for flu like symptoms. He was grateful for call.

## 2018-08-24 ENCOUNTER — Encounter: Payer: Self-pay | Admitting: Nurse Practitioner

## 2018-08-24 ENCOUNTER — Ambulatory Visit: Payer: Medicare Other | Attending: Nurse Practitioner | Admitting: Nurse Practitioner

## 2018-08-24 VITALS — BP 134/88 | HR 61 | Temp 98.0°F | Resp 16 | Ht 68.0 in | Wt 195.0 lb

## 2018-08-24 DIAGNOSIS — G894 Chronic pain syndrome: Secondary | ICD-10-CM | POA: Diagnosis present

## 2018-08-24 DIAGNOSIS — M792 Neuralgia and neuritis, unspecified: Secondary | ICD-10-CM | POA: Diagnosis present

## 2018-08-24 DIAGNOSIS — M542 Cervicalgia: Secondary | ICD-10-CM | POA: Insufficient documentation

## 2018-08-24 DIAGNOSIS — E559 Vitamin D deficiency, unspecified: Secondary | ICD-10-CM | POA: Diagnosis present

## 2018-08-24 DIAGNOSIS — K219 Gastro-esophageal reflux disease without esophagitis: Secondary | ICD-10-CM | POA: Diagnosis present

## 2018-08-24 DIAGNOSIS — G8929 Other chronic pain: Secondary | ICD-10-CM | POA: Diagnosis present

## 2018-08-24 DIAGNOSIS — M5412 Radiculopathy, cervical region: Secondary | ICD-10-CM | POA: Insufficient documentation

## 2018-08-24 DIAGNOSIS — M47816 Spondylosis without myelopathy or radiculopathy, lumbar region: Secondary | ICD-10-CM | POA: Diagnosis present

## 2018-08-24 MED ORDER — OXYCODONE HCL 10 MG PO TABS: 10.0000 mg | ORAL_TABLET | Freq: Every day | ORAL | 0 refills | Status: DC | PRN

## 2018-08-24 MED ORDER — VITAMIN D3 50 MCG (2000 UT) PO CAPS: ORAL_CAPSULE | ORAL | 99 refills | Status: DC

## 2018-08-24 MED ORDER — MAGNESIUM OXIDE 400 MG PO TABS: 400.0000 mg | ORAL_TABLET | Freq: Every day | ORAL | 0 refills | Status: DC

## 2018-08-24 MED ORDER — GABAPENTIN 300 MG PO CAPS: ORAL_CAPSULE | ORAL | 1 refills | Status: DC

## 2018-08-24 NOTE — Progress Notes (Signed)
Nursing Pain Medication Assessment:  Safety precautions to be maintained throughout the outpatient stay will include: orient to surroundings, keep bed in low position, maintain call bell within reach at all times, provide assistance with transfer out of bed and ambulation.  Medication Inspection Compliance: Pill count conducted under aseptic conditions, in front of the patient. Neither the pills nor the bottle was removed from the patient's sight at any time. Once count was completed pills were immediately returned to the patient in their original bottle.  Medication: Oxycodone IR Pill/Patch Count: 97 of 150 pills remain Pill/Patch Appearance: Markings consistent with prescribed medication Bottle Appearance: Standard pharmacy container. Clearly labeled. Filled Date: 01 / 12 / 2020 Last Medication intake:  Today

## 2018-08-24 NOTE — Progress Notes (Signed)
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 prescriptions will be sent. If written prescriptions are taken to a different pharmacy, please inform the nursing staff. The pharmacy listed in the electronic medical record should be the one where you would like electronic prescriptions to be sent.  Electronic prescriptions: In compliance with the [place] Strengthen Opioid Misuse Prevention (STOP) Act of 2017 (Session Law 2017-74/H243), effective August 03, 2018, all controlled substances must be electronically prescribed. Calling prescriptions to the pharmacy will cease to exist.  Prescription refills: Only during scheduled appointments. Applies to all prescriptions.  NOTE: The following applies primarily to controlled substances (Opioid* Pain Medications).   Patient's responsibilities: 1. Pain Pills: Bring all pain pills to every appointment  (except for procedure appointments). 2. Pill Bottles: Bring pills in original pharmacy bottle. Always bring the newest bottle. Bring bottle, even if empty. 3. Medication refills: You are responsible for knowing and keeping track of what medications you take and those you need refilled. The day before your appointment: write a list of all prescriptions that need to be refilled. The day of the appointment: give the list to the admitting nurse. Prescriptions will be written only during appointments. If you forget a medication: it will not be "Called in", "Faxed", or "electronically sent". You will need to get another appointment to get these prescribed. No early refills. Do not call asking to have your prescription filled early. 4. Prescription Accuracy: You are responsible for carefully inspecting your prescriptions before leaving our office. Have the discharge nurse carefully go over each prescription with you, before taking them home. Make sure that your name is accurately spelled, that your address is correct. Check the name and dose of your medication to make sure it is accurate. Check the number of pills, and the written instructions to make sure they are clear and accurate. Make sure that you are given enough medication to last until your next medication refill appointment. 5. Taking Medication: Take medication as prescribed. When it comes to controlled substances, taking less pills or less frequently than prescribed is permitted and encouraged. Never take more pills than instructed. Never take medication   more frequently than prescribed.  6. Inform other Doctors: Always inform, all of your healthcare providers, of all the medications you take. 7. Pain Medication from other Providers: You are not allowed to accept any additional pain medication from any other Doctor or Healthcare provider. There are two exceptions to this rule. (see below) In the event that you require additional pain medication, you  are responsible for notifying us, as stated below. 8. Medication Agreement: You are responsible for carefully reading and following our Medication Agreement. This must be signed before receiving any prescriptions from our practice. Safely store a copy of your signed Agreement. Violations to the Agreement will result in no further prescriptions. (Additional copies of our Medication Agreement are available upon request.) 9. Laws, Rules, & Regulations: All patients are expected to follow all Federal and State Laws, Statutes, Rules, & Regulations. Ignorance of the Laws does not constitute a valid excuse. The use of any illegal substances is prohibited. 10. Adopted CDC guidelines & recommendations: Target dosing levels will be at or below 60 MME/day. Use of benzodiazepines** is not recommended.  Exceptions: There are only two exceptions to the rule of not receiving pain medications from other Healthcare Providers. 1. Exception #1 (Emergencies): In the event of an emergency (i.e.: accident requiring emergency care), you are allowed to receive additional pain medication. However, you are responsible for: As soon as you are able, call our office [phone], at any time of the day or night, and leave a message stating your name, the date and nature of the emergency, and the name and dose of the medication prescribed. In the event that your call is answered by a member of our staff, make sure to document and save the date, time, and the name of the person that took your information.  2. Exception #2 (Planned Surgery): In the event that you are scheduled by another doctor or dentist to have any type of surgery or procedure, you are allowed (for a period no longer than 30 days), to receive additional pain medication, for the acute post-op pain. However, in this case, you are responsible for picking up a copy of our "Post-op Pain Management for Surgeons" handout, and giving it to your surgeon or dentist. This document  is available at our office, and does not require an appointment to obtain it. Simply go to our office during business hours (Monday-Thursday from 8:00 AM to 4:00 PM) (Friday 8:00 AM to 12:00 Noon) or if you have a scheduled appointment with us, prior to your surgery, and ask for it by name. In addition, you will need to provide us with your name, name of your surgeon, type of surgery, and date of procedure or surgery.  *Opioid medications include: morphine, codeine, oxycodone, oxymorphone, hydrocodone, hydromorphone, meperidine, tramadol, tapentadol, buprenorphine, fentanyl, methadone. **Benzodiazepine medications include: diazepam (Valium), alprazolam (Xanax), clonazepam (Klonopine), lorazepam (Ativan), clorazepate (Tranxene), chlordiazepoxide (Librium), estazolam (Prosom), oxazepam (Serax), temazepam (Restoril), triazolam (Halcion) (Last updated: 09/30/2017) ____________________________________________________________________________________________   BMI Assessment: Estimated body mass index is 29.65 kg/m as calculated from the following:   Height as of this encounter: 5' 8" (1.727 m).   Weight as of this encounter: 195 lb (88.5 kg).  BMI interpretation table: BMI level Category Range association with higher incidence of chronic pain  <18 kg/m2 Underweight   18.5-24.9 kg/m2 Ideal body weight   25-29.9 kg/m2 Overweight  Increased incidence by 20%  30-34.9 kg/m2 Obese (Class I) Increased incidence by 68%  35-39.9 kg/m2 Severe obesity (Class II) Increased incidence   by 136%  >40 kg/m2 Extreme obesity (Class III) Increased incidence by 254%   Patient's current BMI Ideal Body weight  Body mass index is 29.65 kg/m. Ideal body weight: 68.4 kg (150 lb 12.7 oz) Adjusted ideal body weight: 76.4 kg (168 lb 7.6 oz)   BMI Readings from Last 4 Encounters:  08/24/18 29.65 kg/m  05/25/18 29.65 kg/m  04/25/18 28.89 kg/m  03/01/18 29.63 kg/m   Wt Readings from Last 4 Encounters:  08/24/18 195  lb (88.5 kg)  05/25/18 195 lb (88.5 kg)  04/25/18 190 lb (86.2 kg)  03/01/18 194 lb 14.4 oz (88.4 kg)

## 2018-08-24 NOTE — Patient Instructions (Addendum)
____________________________________________________________________________________________  Medication Rules  Purpose: To inform patients, and their family members, of our rules and regulations.  Applies to: All patients receiving prescriptions (written or electronic).  Pharmacy of record: Pharmacy where electronic prescriptions will be sent. If written prescriptions are taken to a different pharmacy, please inform the nursing staff. The pharmacy listed in the electronic medical record should be the one where you would like electronic prescriptions to be sent.  Electronic prescriptions: In compliance with the [place] Strengthen Opioid Misuse Prevention (STOP) Act of 2017 (Session Law 2017-74/H243), effective August 03, 2018, all controlled substances must be electronically prescribed. Calling prescriptions to the pharmacy will cease to exist.  Prescription refills: Only during scheduled appointments. Applies to all prescriptions.  NOTE: The following applies primarily to controlled substances (Opioid* Pain Medications).   Patient's responsibilities: 1. Pain Pills: Bring all pain pills to every appointment (except for procedure appointments). 2. Pill Bottles: Bring pills in original pharmacy bottle. Always bring the newest bottle. Bring bottle, even if empty. 3. Medication refills: You are responsible for knowing and keeping track of what medications you take and those you need refilled. The day before your appointment: write a list of all prescriptions that need to be refilled. The day of the appointment: give the list to the admitting nurse. Prescriptions will be written only during appointments. If you forget a medication: it will not be "Called in", "Faxed", or "electronically sent". You will need to get another appointment to get these prescribed. No early refills. Do not call asking to have your prescription filled early. 4. Prescription Accuracy: You are responsible for  carefully inspecting your prescriptions before leaving our office. Have the discharge nurse carefully go over each prescription with you, before taking them home. Make sure that your name is accurately spelled, that your address is correct. Check the name and dose of your medication to make sure it is accurate. Check the number of pills, and the written instructions to make sure they are clear and accurate. Make sure that you are given enough medication to last until your next medication refill appointment. 5. Taking Medication: Take medication as prescribed. When it comes to controlled substances, taking less pills or less frequently than prescribed is permitted and encouraged. Never take more pills than instructed. Never take medication more frequently than prescribed.  6. Inform other Doctors: Always inform, all of your healthcare providers, of all the medications you take. 7. Pain Medication from other Providers: You are not allowed to accept any additional pain medication from any other Doctor or Healthcare provider. There are two exceptions to this rule. (see below) In the event that you require additional pain medication, you are responsible for notifying us, as stated below. 8. Medication Agreement: You are responsible for carefully reading and following our Medication Agreement. This must be signed before receiving any prescriptions from our practice. Safely store a copy of your signed Agreement. Violations to the Agreement will result in no further prescriptions. (Additional copies of our Medication Agreement are available upon request.) 9. Laws, Rules, & Regulations: All patients are expected to follow all Federal and State Laws, Statutes, Rules, & Regulations. Ignorance of the Laws does not constitute a valid excuse. The use of any illegal substances is prohibited. 10. Adopted CDC guidelines & recommendations: Target dosing levels will be at or below 60 MME/day. Use of benzodiazepines** is not  recommended.  Exceptions: There are only two exceptions to the rule of not receiving pain medications from other Healthcare Providers. 1.   Exception #1 (Emergencies): In the event of an emergency (i.e.: accident requiring emergency care), you are allowed to receive additional pain medication. However, you are responsible for: As soon as you are able, call our office [phone], at any time of the day or night, and leave a message stating your name, the date and nature of the emergency, and the name and dose of the medication prescribed. In the event that your call is answered by a member of our staff, make sure to document and save the date, time, and the name of the person that took your information.  2. Exception #2 (Planned Surgery): In the event that you are scheduled by another doctor or dentist to have any type of surgery or procedure, you are allowed (for a period no longer than 30 days), to receive additional pain medication, for the acute post-op pain. However, in this case, you are responsible for picking up a copy of our "Post-op Pain Management for Surgeons" handout, and giving it to your surgeon or dentist. This document is available at our office, and does not require an appointment to obtain it. Simply go to our office during business hours (Monday-Thursday from 8:00 AM to 4:00 PM) (Friday 8:00 AM to 12:00 Noon) or if you have a scheduled appointment with [place], prior to your surgery, and ask for it by name. In addition, you will need to provide [place] with your name, name of your surgeon, type of surgery, and date of procedure or surgery.  *Opioid medications include: morphine, codeine, oxycodone, oxymorphone, hydrocodone, hydromorphone, meperidine, tramadol, tapentadol, buprenorphine, fentanyl, methadone. **Benzodiazepine medications include: diazepam (Valium), alprazolam (Xanax), clonazepam (Klonopine), lorazepam (Ativan), clorazepate (Tranxene), chlordiazepoxide (Librium), estazolam (Prosom),  oxazepam (Serax), temazepam (Restoril), triazolam (Halcion) (Last updated: 09/30/2017) ____________________________________________________________________________________________   BMI Assessment: Estimated body mass index is 29.65 kg/m as calculated from the following:   Height as of this encounter: 5\' 8"  (1.727 m).   Weight as of this encounter: 195 lb (88.5 kg).  BMI interpretation table: BMI level Category Range association with higher incidence of chronic pain  <18 kg/m2 Underweight   18.5-24.9 kg/m2 Ideal body weight   25-29.9 kg/m2 Overweight  Increased incidence by 20%  30-34.9 kg/m2 Obese (Class I) Increased incidence by 68%  35-39.9 kg/m2 Severe obesity (Class II) Increased incidence by 136%  >40 kg/m2 Extreme obesity (Class III) Increased incidence by 254%   Patient's current BMI Ideal Body weight  Body mass index is 29.65 kg/m. Ideal body weight: 68.4 kg (150 lb 12.7 oz) Adjusted ideal body weight: 76.4 kg (168 lb 7.6 oz)   BMI Readings from Last 4 Encounters:  08/24/18 29.65 kg/m  05/25/18 29.65 kg/m  04/25/18 28.89 kg/m  03/01/18 29.63 kg/m   Wt Readings from Last 4 Encounters:  08/24/18 195 lb (88.5 kg)  05/25/18 195 lb (88.5 kg)  04/25/18 190 lb (86.2 kg)  03/01/18 194 lb 14.4 oz (88.4 kg)

## 2018-08-30 ENCOUNTER — Other Ambulatory Visit: Payer: Self-pay | Admitting: Nurse Practitioner

## 2018-08-30 DIAGNOSIS — M792 Neuralgia and neuritis, unspecified: Secondary | ICD-10-CM

## 2018-10-24 ENCOUNTER — Telehealth: Payer: Self-pay

## 2018-10-24 NOTE — Telephone Encounter (Signed)
Due to COVID-19 pandemic, called to offer to reschedule patient to a later date or to try a Webex visit.  The patient would like to try Webex. Sent the patient MyChart sign-up instructions via email. He understands he needs to sign up in order to receive the waiver and instructions for appointment. He was grateful for assistance.

## 2018-10-27 ENCOUNTER — Other Ambulatory Visit: Payer: Self-pay

## 2018-10-27 ENCOUNTER — Telehealth (INDEPENDENT_AMBULATORY_CARE_PROVIDER_SITE_OTHER): Payer: Medicare Other | Admitting: Cardiovascular Disease

## 2018-10-27 VITALS — BP 131/78 | HR 63 | Wt 194.0 lb

## 2018-10-27 DIAGNOSIS — E782 Mixed hyperlipidemia: Secondary | ICD-10-CM

## 2018-10-27 DIAGNOSIS — I2510 Atherosclerotic heart disease of native coronary artery without angina pectoris: Secondary | ICD-10-CM | POA: Diagnosis not present

## 2018-10-27 DIAGNOSIS — I10 Essential (primary) hypertension: Secondary | ICD-10-CM | POA: Diagnosis not present

## 2018-10-27 NOTE — Progress Notes (Signed)
Virtual Visit via Telephone Note    Evaluation Performed:  Follow-up visit  This visit type was conducted due to national recommendations for restrictions regarding the COVID-19 Pandemic (e.g. social distancing).  This format is felt to be most appropriate for this patient at this time.  All issues noted in this document were discussed and addressed.  No physical exam was performed (except for noted visual exam findings with Video Visits).  Please refer to the patient's chart (MyChart message for video visits and phone note for telephone visits) for the patient's consent to telehealth for [facility].  Date:  10/27/2018   ID:  [name] [name], DOB 05-Feb-1951, [name] [name]  Patient Location:  [address] [place] [address]   Provider location:   [facility]  PCP:  Gordan Payment., MD  Cardiologist:  [name] Bollman, MD  Electrophysiologist:  None   Chief Complaint:  CAD  History of Present Illness:    [name] [name] is a 68 y.o. male who presents via audio/video conferencing for a telehealth visit today.    The patient does not symptoms concerning for COVID-19 infection (fever, chills, cough, or new SHORTNESS OF BREATH).   68 y.o. 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 He's been maintained on long term DAPT. He also has a history of hyperlipidemia, hypertension, GERD and chronic pain syndrome.  He's been feeling well. Had some leg swelling but this has now resolved. He denies chest pain or shortness of breath. No orthopnea or PND. He has taken one NTG since his last visit. He feels like he probably had 'indigestion' as the episode occurred after eating. The NTG did not make any difference. Continues to have some problems with his neck.   Past Medical History:  Diagnosis Date  . CAD (coronary artery disease)    CABG  . Chronic pain syndrome   . GERD  (gastroesophageal reflux disease)   . Hyperlipidemia   . Hypertension   . Radiculitis of right cervical region 09/04/2015   Past Surgical History:  Procedure Laterality Date  . CHOLECYSTECTOMY    . CORONARY ARTERY BYPASS GRAFT     multivessel  . HERNIA REPAIR    . LEFT HEART CATHETERIZATION WITH CORONARY/GRAFT ANGIOGRAM N/A 04/21/2013   Procedure: LEFT HEART CATHETERIZATION WITH [name];  Surgeon: Micheline Chapman, MD;  Location: [facility] CATH LAB;  Service: Cardiovascular;  Laterality: N/A;  . PCI 2009 with stenting of LIMA-LAD anastamosis       Current Meds  Medication Sig  . aspirin 81 MG tablet Take 1 tablet (81 mg total) by mouth daily.  Marland Kitchen atorvastatin (LIPITOR) 20 MG tablet TAKE 1 TABLET BY MOUTH EVERY DAY  . Cholecalciferol (VITAMIN D3) 50 MCG (2000 UT) capsule Take 1 capsule (2,000 Units total) by mouth daily.  . clopidogrel (PLAVIX) 75 MG tablet TAKE 1 TABLET BY MOUTH EVERY DAY  . gabapentin (NEURONTIN) 300 MG capsule 300 mg TID and 900 mg QHS  . hydrochlorothiazide (HYDRODIURIL) 25 MG tablet TAKE 1 TABLET BY MOUTH EVERY DAY  . isosorbide mononitrate (IMDUR) 30 MG 24 hr tablet TAKE 1 TABLET (30 MG TOTAL) BY MOUTH DAILY.  . magnesium oxide (MAG-OX) 400 MG tablet Take 1 tablet (400 mg total) by mouth daily.  . metoprolol succinate (TOPROL-XL) 50 MG 24 hr tablet TAKE 1 TABLET BY MOUTH EVERY DAY WITH OR IMMEDIATELY FOLLOWING A MEAL  .  nitroGLYCERIN (NITROSTAT) 0.4 MG SL tablet Place 1 tablet (0.4 mg total) under the tongue every 5 (five) minutes as needed for chest pain.  [name] ON 11/12/2018] Oxycodone HCl 10 MG TABS Take 1 tablet (10 mg total) by mouth 5 (five) times daily as needed for up to 30 days (pain).  . Oxycodone HCl 10 MG TABS Take 1 tablet (10 mg total) by mouth 5 (five) times daily as needed for up to 30 days.  . pantoprazole (PROTONIX) 40 MG tablet TAKE 1 TABLET BY MOUTH EVERY DAY  . potassium chloride SA (K-DUR,KLOR-CON) 20 MEQ tablet Take 20 mEq by mouth  daily.  . vitamin B-12 (CYANOCOBALAMIN) 1000 MCG tablet Take 2,000 mcg by mouth daily.     Allergies:   Patient has no known allergies.   Social History   Tobacco Use  . Smoking status: Former Games developer  . Smokeless tobacco: Never Used  Substance Use Topics  . Alcohol use: No    Alcohol/week: 0.0 standard drinks  . Drug use: No     Family Hx: The patient's family history includes Multiple sclerosis in his mother; Stroke in his father.  ROS:   Please see the history of present illness.    Positive for back pain. All other systems reviewed and are negative.   Labs/Other Tests and Data Reviewed:    Recent Labs: 04/25/2018: ALT 18 05/04/2018: BUN 9; Creatinine, Ser 0.93; Potassium 4.2; Sodium 139   Recent Lipid Panel Lab Results  Component Value Date/Time   CHOL 88 (L) 04/25/2018 10:24 AM   TRIG 53 04/25/2018 10:24 AM   HDL 42 04/25/2018 10:24 AM   CHOLHDL 2.1 04/25/2018 10:24 AM   CHOLHDL 3.8 06/18/2016 09:54 AM   LDLCALC 35 04/25/2018 10:24 AM   LDLDIRECT 92.7 07/04/2009 09:46 AM    Wt Readings from Last 3 Encounters:  10/27/18 194 lb (88 kg)  08/24/18 195 lb (88.5 kg)  05/25/18 195 lb (88.5 kg)     Exam:    Vital Signs:  BP 131/78   Pulse 63   Wt 194 lb (88 kg)   BMI 29.50 kg/m    Remainder exam not done as this is a phone interview.   ASSESSMENT & PLAN:    1.  CAD, native vessel, without angina: The patient appears stable.  I reviewed his medications and I would be inclined to keep him on long-term dual antiplatelet therapy with aspirin and clopidogrel.  He is treated with a statin drug.  He is also treated with a beta-blocker.  2.  Mixed hyperlipidemia: His lipids are at goal.  In fact, his total cholesterol is less than 100 mg/dL with an LDL cholesterol of 35 mg/dL.  He will continue on atorvastatin.  3.  Essential hypertension: Blood pressure is controlled on current program.  I reviewed his medications with him today.  I reviewed his most recent labs  from October 2019.  Summary: I have recommended the patient follow-up in 6 months with a full lab panel at that time to include a CBC, complete metabolic panel, and lipid panel.  COVID-19 Education: The signs and symptoms of COVID-19 were discussed with the patient and how to seek care for testing (follow up with PCP or arrange E-visit).  The importance of social distancing was discussed today.  Patient Risk:   After full review of this patients clinical status, I feel that they are at least moderate risk at this time.  Time:   Today, I have spent 14 minutes  with the patient with telehealth technology discussing problems outlined above.    Medication Adjustments/Labs and Tests Ordered: Current medicines are reviewed at length with the patient today.  Concerns regarding medicines are outlined above.   Tests Ordered: No orders of the defined types were placed in this encounter.  Medication Changes: No orders of the defined types were placed in this encounter.   Disposition:  in 6 month(s)  Signed, [name] Bollman, MD  10/27/2018 10:02 AM    [facility]

## 2018-11-07 ENCOUNTER — Other Ambulatory Visit: Payer: Self-pay | Admitting: Cardiovascular Disease

## 2018-11-23 ENCOUNTER — Other Ambulatory Visit: Payer: Self-pay

## 2018-11-23 ENCOUNTER — Encounter: Payer: Self-pay | Admitting: Nurse Practitioner

## 2018-11-23 ENCOUNTER — Ambulatory Visit: Payer: Medicare Other | Attending: Nurse Practitioner | Admitting: Nurse Practitioner

## 2018-11-23 DIAGNOSIS — G894 Chronic pain syndrome: Secondary | ICD-10-CM

## 2018-11-23 DIAGNOSIS — M5416 Radiculopathy, lumbar region: Secondary | ICD-10-CM

## 2018-11-23 DIAGNOSIS — K219 Gastro-esophageal reflux disease without esophagitis: Secondary | ICD-10-CM

## 2018-11-23 DIAGNOSIS — M792 Neuralgia and neuritis, unspecified: Secondary | ICD-10-CM

## 2018-11-23 DIAGNOSIS — M5412 Radiculopathy, cervical region: Secondary | ICD-10-CM | POA: Diagnosis not present

## 2018-11-23 DIAGNOSIS — E559 Vitamin D deficiency, unspecified: Secondary | ICD-10-CM

## 2018-11-23 DIAGNOSIS — M47816 Spondylosis without myelopathy or radiculopathy, lumbar region: Secondary | ICD-10-CM

## 2018-11-23 DIAGNOSIS — G8929 Other chronic pain: Secondary | ICD-10-CM

## 2018-11-23 MED ORDER — OXYCODONE HCL 10 MG PO TABS: 10.0000 mg | ORAL_TABLET | Freq: Every day | ORAL | 0 refills | Status: DC | PRN

## 2018-11-23 MED ORDER — GABAPENTIN 300 MG PO CAPS: ORAL_CAPSULE | ORAL | 1 refills | Status: DC

## 2018-11-23 MED ORDER — MAGNESIUM OXIDE 400 MG PO TABS: 400.0000 mg | ORAL_TABLET | Freq: Two times a day (BID) | ORAL | 0 refills | Status: DC

## 2018-11-23 MED ORDER — VITAMIN D3 50 MCG (2000 UT) PO CAPS: ORAL_CAPSULE | ORAL | 2 refills | Status: DC

## 2018-11-23 NOTE — Progress Notes (Signed)
Pain Management Encounter Note - Virtual Visit via Telephone Telehealth (real-time audio visits between healthcare provider and patient).  Patient's Phone No. & Preferred Pharmacy:  [phone] (home); [phone] (mobile); (Preferred) [phone]  [facility] [phone] [facility], [place] - [name] [place] [address] Phone: [phone] Fax: [phone]   Pre-screening note:  Our staff contacted [name] [name] and offered him an "in person", "face-to-face" appointment versus a telephone encounter. He indicated preferring the telephone encounter, at this time.  Reason for Virtual Visit: COVID-19*  Social distancing based on CDC and AMA recommendations.   I contacted [name] [name] on 11/23/2018 at 09:45 AM by telephone and clearly identified myself as Thad Ranger, NP. I verified that I was speaking with the correct person using two identifiers (Name and date of birth: 06/11/1951).  Advanced Informed Consent I sought verbal advanced consent from [name] [name] for telemedicine interactions and virtual visit. I informed [name] [name] of the security and privacy concerns, risks, and limitations associated with performing an evaluation and management service by telephone. I also informed [name] [name] of the availability of "in person" appointments and I informed him of the possibility of a patient responsible charge related to this service. [name] [name] expressed understanding and agreed to proceed.   Historic Elements   [name] [name] is a 68 y.o. year old, male patient evaluated today after his last encounter by our practice on 08/30/2018. [name] [name]  has a past medical history of CAD (coronary artery disease), Chronic pain syndrome, GERD (gastroesophageal reflux disease), Hyperlipidemia, Hypertension, and Radiculitis of right cervical region (09/04/2015). He also  has a past surgical history that includes PCI 2009 with stenting of LIMA-LAD anastamosis; Coronary artery bypass graft;  Hernia repair; Cholecystectomy; and left heart catheterization with coronary/graft angiogram (N/A, 04/21/2013). [name] [name] has a current medication list which includes the following prescription(s): aspirin, atorvastatin, vitamin d3, clopidogrel, gabapentin, hydrochlorothiazide, isosorbide mononitrate, magnesium oxide, metoprolol succinate, nitroglycerin, oxycodone hcl, oxycodone hcl, oxycodone hcl, pantoprazole, potassium chloride sa, and vitamin b-12. He  reports that he has quit smoking. He has never used smokeless tobacco. He reports that he does not drink alcohol or use drugs. [name] [name] has No Known Allergies.   HPI  I last saw him on 08/30/2018. He is being evaluated for medication management. He has 5/10 in lower left side above the hip and down the left leg. He is also having right shoulder burning pain. He states that the pain shifts in the lower back from the left to right. However the left is the worse.  He is having increased cramps in his legs. He tries to walk it out. " He feels like he is already in pain so the just deals with it" . He does take Magnesium 400 mg daily. He does take HCL 25 mg along with potassium supplement.  He is not taking the vitamin D. He did have one day when he fell trying to prevent his granddaughter from falling.  Pharmacotherapy Assessment  Analgesic:Oxycodone/APAP 10/325 one tablet every 5 hours (50 mg/day) MME/day: 75 mg/day Monitoring: Pharmacotherapy: No side-effects or adverse reactions reported. [place] PMP: PDMP reviewed during this encounter.       Compliance: No problems identified. Plan: Refer to "POC".  Review of recent tests  DG Cervical Spine With Flex & Extend CLINICAL DATA:  Left posterior neck pain extending down the left shoulder for 1 year. Radiculitis.  EXAM: CERVICAL SPINE COMPLETE WITH FLEXION AND EXTENSION VIEWS (7  views)  COMPARISON:  Cervical spine MRI from 10/31/2015  FINDINGS: No malalignment or prevertebral soft tissue swelling.  Preserved intervertebral disc spaces.  The patient has restricted flexion and restricted extension of the cervical spine. No abnormal cervical motion with flexion or extension. No subluxation noted.  There is mild uncinate and facet spurring bilaterally at the C3-4 level without overt osseous foraminal impingement.  Upper sternotomy wires noted. No appreciable cervical spine fracture.  IMPRESSION: 1. No subluxation or significant intervertebral disc space loss. 2. No abnormal motion with flexion or extension, although both flexion and extension are mildly restricted, with reduced range of both flexion and extension of the neck. 3. Mild uncinate and facet spurring bilaterally at C3-4 without overt osseous foraminal impingement.  Electronically Signed   By: Gaylyn RongWalter  Liebkemann M.D.   On: 05/25/2018 16:30   Clinical Support on 05/25/2018  Component Date Value Ref Range Status  . Summary 05/25/2018 FINAL   Final   Comment: ==================================================================== TOXASSURE SELECT 13 (MW) ==================================================================== Test                             Result       Flag       Units Drug Present and Declared for Prescription Verification   Oxycodone                      241          EXPECTED   ng/mg creat   Oxymorphone                    858          EXPECTED   ng/mg creat   Noroxycodone                   1143         EXPECTED   ng/mg creat   Noroxymorphone                 582          EXPECTED   ng/mg creat    Sources of oxycodone are scheduled prescription medications.    Oxymorphone, noroxycodone, and noroxymorphone are expected    metabolites of oxycodone. Oxymorphone is also available as a    scheduled prescription medication. ==================================================================== Test                      Result    Flag   Units      Ref Range   Creatinine              113              mg/dL       >=13>=20 ======                          ============================================================== Declared Medications:  The flagging and interpretation on this report are based on the  following declared medications.  Unexpected results may arise from  inaccuracies in the declared medications.  **Note: The testing scope of this panel includes these medications:  Oxycodone  **Note: The testing scope of this panel does not include following  reported medications:  Aspirin (Aspirin 81)  Atorvastatin  Cholecalciferol  Clopidogrel (Plavix)  Cyanocobalamin  Gabapentin (Neurontin)  Hydrochlorothiazide (Hydrodiuril)  Isosorbide (Imdur)  Magnesium (Mag-Ox)  Metoprolol (Toprol)  Nitroglycerin (Nitrostat)  Pantoprazole (Protonix)  Potassium (K-Dur) ==================================================================== For clinical consultation, please call [phone]. ====================================================================    Assessment  The primary encounter diagnosis was Chronic cervical radicular pain (Right). Diagnoses of Vitamin D insufficiency, Gastroesophageal reflux disease without esophagitis, Neurogenic pain, Chronic pain syndrome, Lumbar spondylosis, and Chronic lumbar radicular pain (Location of Primary Source of Pain) (Left) (S1 dermatome) were also pertinent to this visit.  Plan of Care  I have changed Walker Kehr. Marzan's magnesium oxide, Oxycodone HCl, and Oxycodone HCl. I am also having him maintain his potassium chloride SA, nitroGLYCERIN, aspirin, vitamin B-12, clopidogrel, atorvastatin, isosorbide mononitrate, hydrochlorothiazide, metoprolol succinate, pantoprazole, Vitamin D3, Oxycodone HCl, and gabapentin.  Pharmacotherapy (Medications Ordered): Meds ordered this encounter  Medications  . Cholecalciferol (VITAMIN D3) 50 MCG (2000 UT) capsule    Sig: Take 1 capsule (2,000 Units total) by mouth daily.    Dispense:  30 capsule    Refill:  2    Do not  add to the "Automatic Refill" notification system.    Order Specific Question:   Supervising Provider    Answer:   Delano Metz [phone]  . magnesium oxide (MAG-OX) 400 MG tablet    Sig: Take 1 tablet (400 mg total) by mouth 2 (two) times daily.    Dispense:  180 tablet    Refill:  0    Do not place this medication, or any other prescription from our practice, on "Automatic Refill". Patient may have prescription filled one day early if pharmacy is closed on scheduled refill date.    Order Specific Question:   Supervising Provider    Answer:   Delano Metz [phone]  . Oxycodone HCl 10 MG TABS    Sig: Take 1 tablet (10 mg total) by mouth 5 (five) times daily as needed for up to 30 days (pain).    Dispense:  150 tablet    Refill:  0    Do not add this medication to the electronic "Automatic Refill" notification system. Patient may have prescription filled one day early if pharmacy is closed on scheduled refill date.    Order Specific Question:   Supervising Provider    Answer:   Delano Metz [phone]  . gabapentin (NEURONTIN) 300 MG capsule    Sig: 300 mg TID and 900 mg QHS    Dispense:  180 capsule    Refill:  1    Do not place this medication, or any other prescription from our practice, on "Automatic Refill". Patient may have prescription filled one day early if pharmacy is closed on scheduled refill date.    Order Specific Question:   Supervising Provider    Answer:   Delano Metz [phone]  . Oxycodone HCl 10 MG TABS    Sig: Take 1 tablet (10 mg total) by mouth 5 (five) times daily as needed for up to 30 days (pain).    Dispense:  150 tablet    Refill:  0    Do not add this medication to the electronic "Automatic Refill" notification system. Patient may have prescription filled one day early if pharmacy is closed on scheduled refill date.    Order Specific Question:   Supervising Provider    Answer:   Delano Metz [phone]  . Oxycodone HCl 10 MG TABS     Sig: Take 1 tablet (10 mg total) by mouth 5 (five) times daily as needed for up to 30 days.    Dispense:  150 tablet    Refill:  0    Do not add this  medication to the electronic "Automatic Refill" notification system. Patient may have prescription filled one day early if pharmacy is closed on scheduled refill date.    Order Specific Question:   Supervising Provider    Answer:   Delano Metz [phone]   Orders:  No orders of the defined types were placed in this encounter.  Follow-up plan:   Return in about 3 months (around 02/22/2019) for MedMgmt.   I discussed the assessment and treatment plan with the patient. The patient was provided an opportunity to ask questions and all were answered. The patient agreed with the plan and demonstrated an understanding of the instructions.  Patient advised to call back or seek an in-person evaluation if the symptoms or condition worsens.  Total duration of non-face-to-face encounter:  13 minutes.  Note by: Thad Ranger, NP Date: 11/23/2018; Time: 10:58 AM  Disclaimer:  * Given the special circumstances of the COVID-19 pandemic, the federal government has announced that the Office for Civil Rights (OCR) will exercise its enforcement discretion and will not impose penalties on physicians using telehealth in the event of noncompliance with regulatory requirements under the [employer] Portability and Accountability Act (HIPAA) in connection with the good faith provision of telehealth during the COVID-19 national public health emergency. (AMA)

## 2018-11-23 NOTE — Patient Instructions (Signed)
____________________________________________________________________________________________  Medication Rules  Purpose: To inform patients, and their family members, of our rules and regulations.  Applies to: All patients receiving prescriptions (written or electronic).  Pharmacy of record: Pharmacy where electronic prescriptions will be sent. If written prescriptions are taken to a different pharmacy, please inform the nursing staff. The pharmacy listed in the electronic medical record should be the one where you would like electronic prescriptions to be sent.  Electronic prescriptions: In compliance with the [place] Strengthen Opioid Misuse Prevention (STOP) Act of 2017 (Session Law 2017-74/H243), effective August 03, 2018, all controlled substances must be electronically prescribed. Calling prescriptions to the pharmacy will cease to exist.  Prescription refills: Only during scheduled appointments. Applies to all prescriptions.  NOTE: The following applies primarily to controlled substances (Opioid* Pain Medications).   Patient's responsibilities: 1. Pain Pills: Bring all pain pills to every appointment (except for procedure appointments). 2. Pill Bottles: Bring pills in original pharmacy bottle. Always bring the newest bottle. Bring bottle, even if empty. 3. Medication refills: You are responsible for knowing and keeping track of what medications you take and those you need refilled. The day before your appointment: write a list of all prescriptions that need to be refilled. The day of the appointment: give the list to the admitting nurse. Prescriptions will be written only during appointments. No prescriptions will be written on procedure days. If you forget a medication: it will not be "Called in", "Faxed", or "electronically sent". You will need to get another appointment to get these prescribed. No early refills. Do not call asking to have your prescription filled  early. 4. Prescription Accuracy: You are responsible for carefully inspecting your prescriptions before leaving our office. Have the discharge nurse carefully go over each prescription with you, before taking them home. Make sure that your name is accurately spelled, that your address is correct. Check the name and dose of your medication to make sure it is accurate. Check the number of pills, and the written instructions to make sure they are clear and accurate. Make sure that you are given enough medication to last until your next medication refill appointment. 5. Taking Medication: Take medication as prescribed. When it comes to controlled substances, taking less pills or less frequently than prescribed is permitted and encouraged. Never take more pills than instructed. Never take medication more frequently than prescribed.  6. Inform other Doctors: Always inform, all of your healthcare providers, of all the medications you take. 7. Pain Medication from other Providers: You are not allowed to accept any additional pain medication from any other Doctor or Healthcare provider. There are two exceptions to this rule. (see below) In the event that you require additional pain medication, you are responsible for notifying us, as stated below. 8. Medication Agreement: You are responsible for carefully reading and following our Medication Agreement. This must be signed before receiving any prescriptions from our practice. Safely store a copy of your signed Agreement. Violations to the Agreement will result in no further prescriptions. (Additional copies of our Medication Agreement are available upon request.) 9. Laws, Rules, & Regulations: All patients are expected to follow all Federal and State Laws, Statutes, Rules, & Regulations. Ignorance of the Laws does not constitute a valid excuse. The use of any illegal substances is prohibited. 10. Adopted CDC guidelines & recommendations: Target dosing levels will be  at or below 60 MME/day. Use of benzodiazepines** is not recommended.  Exceptions: There are only two exceptions to the rule of not   receiving pain medications from other Healthcare Providers. 1. Exception #1 (Emergencies): In the event of an emergency (i.e.: accident requiring emergency care), you are allowed to receive additional pain medication. However, you are responsible for: As soon as you are able, call our office [phone], at any time of the day or night, and leave a message stating your name, the date and nature of the emergency, and the name and dose of the medication prescribed. In the event that your call is answered by a member of our staff, make sure to document and save the date, time, and the name of the person that took your information.  2. Exception #2 (Planned Surgery): In the event that you are scheduled by another doctor or dentist to have any type of surgery or procedure, you are allowed (for a period no longer than 30 days), to receive additional pain medication, for the acute post-op pain. However, in this case, you are responsible for picking up a copy of our "Post-op Pain Management for Surgeons" handout, and giving it to your surgeon or dentist. This document is available at our office, and does not require an appointment to obtain it. Simply go to our office during business hours (Monday-Thursday from 8:00 AM to 4:00 PM) (Friday 8:00 AM to 12:00 Noon) or if you have a scheduled appointment with us, prior to your surgery, and ask for it by name. In addition, you will need to provide us with your name, name of your surgeon, type of surgery, and date of procedure or surgery.  *Opioid medications include: morphine, codeine, oxycodone, oxymorphone, hydrocodone, hydromorphone, meperidine, tramadol, tapentadol, buprenorphine, fentanyl, methadone. **Benzodiazepine medications include: diazepam (Valium), alprazolam (Xanax), clonazepam (Klonopine), lorazepam (Ativan), clorazepate  (Tranxene), chlordiazepoxide (Librium), estazolam (Prosom), oxazepam (Serax), temazepam (Restoril), triazolam (Halcion) (Last updated: 09/30/2017) ____________________________________________________________________________________________    

## 2019-02-21 ENCOUNTER — Encounter: Payer: Medicare Other | Admitting: Nurse Practitioner

## 2019-02-21 ENCOUNTER — Encounter: Payer: Self-pay | Admitting: Pain Medicine

## 2019-02-21 DIAGNOSIS — Z79899 Other long term (current) drug therapy: Secondary | ICD-10-CM | POA: Insufficient documentation

## 2019-02-21 DIAGNOSIS — M899 Disorder of bone, unspecified: Secondary | ICD-10-CM | POA: Insufficient documentation

## 2019-02-21 DIAGNOSIS — Z789 Other specified health status: Secondary | ICD-10-CM | POA: Insufficient documentation

## 2019-02-21 NOTE — Patient Instructions (Signed)
____________________________________________________________________________________________  Medication Rules  Purpose: To inform patients, and their family members, of our rules and regulations.  Applies to: All patients receiving prescriptions (written or electronic).  Pharmacy of record: Pharmacy where electronic prescriptions will be sent. If written prescriptions are taken to a different pharmacy, please inform the nursing staff. The pharmacy listed in the electronic medical record should be the one where you would like electronic prescriptions to be sent.  Electronic prescriptions: In compliance with the [place] Strengthen Opioid Misuse Prevention (STOP) Act of 2017 (Session Law 2017-74/H243), effective August 03, 2018, all controlled substances must be electronically prescribed. Calling prescriptions to the pharmacy will cease to exist.  Prescription refills: Only during scheduled appointments. Applies to all prescriptions.  NOTE: The following applies primarily to controlled substances (Opioid* Pain Medications).   Patient's responsibilities: 1. Pain Pills: Bring all pain pills to every appointment (except for procedure appointments). 2. Pill Bottles: Bring pills in original pharmacy bottle. Always bring the newest bottle. Bring bottle, even if empty. 3. Medication refills: You are responsible for knowing and keeping track of what medications you take and those you need refilled. The day before your appointment: write a list of all prescriptions that need to be refilled. The day of the appointment: give the list to the admitting nurse. Prescriptions will be written only during appointments. No prescriptions will be written on procedure days. If you forget a medication: it will not be "Called in", "Faxed", or "electronically sent". You will need to get another appointment to get these prescribed. No early refills. Do not call asking to have your prescription filled  early. 4. Prescription Accuracy: You are responsible for carefully inspecting your prescriptions before leaving our office. Have the discharge nurse carefully go over each prescription with you, before taking them home. Make sure that your name is accurately spelled, that your address is correct. Check the name and dose of your medication to make sure it is accurate. Check the number of pills, and the written instructions to make sure they are clear and accurate. Make sure that you are given enough medication to last until your next medication refill appointment. 5. Taking Medication: Take medication as prescribed. When it comes to controlled substances, taking less pills or less frequently than prescribed is permitted and encouraged. Never take more pills than instructed. Never take medication more frequently than prescribed.  6. Inform other Doctors: Always inform, all of your healthcare providers, of all the medications you take. 7. Pain Medication from other Providers: You are not allowed to accept any additional pain medication from any other Doctor or Healthcare provider. There are two exceptions to this rule. (see below) In the event that you require additional pain medication, you are responsible for notifying us, as stated below. 8. Medication Agreement: You are responsible for carefully reading and following our Medication Agreement. This must be signed before receiving any prescriptions from our practice. Safely store a copy of your signed Agreement. Violations to the Agreement will result in no further prescriptions. (Additional copies of our Medication Agreement are available upon request.) 9. Laws, Rules, & Regulations: All patients are expected to follow all Federal and State Laws, Statutes, Rules, & Regulations. Ignorance of the Laws does not constitute a valid excuse. The use of any illegal substances is prohibited. 10. Adopted CDC guidelines & recommendations: Target dosing levels will be  at or below 60 MME/day. Use of benzodiazepines** is not recommended.  Exceptions: There are only two exceptions to the rule of not   receiving pain medications from other Healthcare Providers. 1. Exception #1 (Emergencies): In the event of an emergency (i.e.: accident requiring emergency care), you are allowed to receive additional pain medication. However, you are responsible for: As soon as you are able, call our office [phone], at any time of the day or night, and leave a message stating your name, the date and nature of the emergency, and the name and dose of the medication prescribed. In the event that your call is answered by a member of our staff, make sure to document and save the date, time, and the name of the person that took your information.  2. Exception #2 (Planned Surgery): In the event that you are scheduled by another doctor or dentist to have any type of surgery or procedure, you are allowed (for a period no longer than 30 days), to receive additional pain medication, for the acute post-op pain. However, in this case, you are responsible for picking up a copy of our "Post-op Pain Management for Surgeons" handout, and giving it to your surgeon or dentist. This document is available at our office, and does not require an appointment to obtain it. Simply go to our office during business hours (Monday-Thursday from 8:00 AM to 4:00 PM) (Friday 8:00 AM to 12:00 Noon) or if you have a scheduled appointment with us, prior to your surgery, and ask for it by name. In addition, you will need to provide us with your name, name of your surgeon, type of surgery, and date of procedure or surgery.  *Opioid medications include: morphine, codeine, oxycodone, oxymorphone, hydrocodone, hydromorphone, meperidine, tramadol, tapentadol, buprenorphine, fentanyl, methadone. **Benzodiazepine medications include: diazepam (Valium), alprazolam (Xanax), clonazepam (Klonopine), lorazepam (Ativan), clorazepate  (Tranxene), chlordiazepoxide (Librium), estazolam (Prosom), oxazepam (Serax), temazepam (Restoril), triazolam (Halcion) (Last updated: 09/30/2017) ____________________________________________________________________________________________   ____________________________________________________________________________________________  Medication Recommendations and Reminders  Applies to: All patients receiving prescriptions (written and/or electronic).  Medication Rules & Regulations: These rules and regulations exist for your safety and that of others. They are not flexible and neither are we. Dismissing or ignoring them will be considered "non-compliance" with medication therapy, resulting in complete and irreversible termination of such therapy. (See document titled "Medication Rules" for more details.) In all conscience, because of safety reasons, we cannot continue providing a therapy where the patient does not follow instructions.  Pharmacy of record:   Definition: This is the pharmacy where your electronic prescriptions will be sent.   We do not endorse any particular pharmacy.  You are not restricted in your choice of pharmacy.  The pharmacy listed in the electronic medical record should be the one where you want electronic prescriptions to be sent.  If you choose to change pharmacy, simply notify our nursing staff of your choice of new pharmacy.  Recommendations:  Keep all of your pain medications in a safe place, under lock and key, even if you live alone.   After you fill your prescription, take 1 week's worth of pills and put them away in a safe place. You should keep a separate, properly labeled bottle for this purpose. The remainder should be kept in the original bottle. Use this as your primary supply, until it runs out. Once it's gone, then you know that you have 1 week's worth of medicine, and it is time to come in for a prescription refill. If you do this correctly, it  is unlikely that you will ever run out of medicine.  To make sure that the above recommendation works,   it is very important that you make sure your medication refill appointments are scheduled at least 1 week before you run out of medicine. To do this in an effective manner, make sure that you do not leave the office without scheduling your next medication management appointment. Always ask the nursing staff to show you in your prescription , when your medication will be running out. Then arrange for the receptionist to get you a return appointment, at least 7 days before you run out of medicine. Do not wait until you have 1 or 2 pills left, to come in. This is very poor planning and does not take into consideration that we may need to cancel appointments due to bad weather, sickness, or emergencies affecting our staff.  "Partial Fill": If for any reason your pharmacy does not have enough pills/tablets to completely fill or refill your prescription, do not allow for a "partial fill". You will need a separate prescription to fill the remaining amount, which we will not provide. If the reason for the partial fill is your insurance, you will need to talk to the pharmacist about payment alternatives for the remaining tablets, but again, do not accept a partial fill.  Prescription refills and/or changes in medication(s):   Prescription refills, and/or changes in dose or medication, will be conducted only during scheduled medication management appointments. (Applies to both, written and electronic prescriptions.)  No refills on procedure days. No medication will be changed or started on procedure days. No changes, adjustments, and/or refills will be conducted on a procedure day. Doing so will interfere with the diagnostic portion of the procedure.  No phone refills. No medications will be "called into the pharmacy".  No Fax refills.  No weekend refills.  No Holliday refills.  No after hours  refills.  Remember:  Business hours are:  Monday to Thursday 8:00 AM to 4:00 PM Provider's Schedule: Crystal King, NP - Appointments are:  Medication management: Monday to Thursday 8:00 AM to 4:00 PM Ozetta Flatley, MD - Appointments are:  Medication management: Monday and Wednesday 8:00 AM to 4:00 PM Procedure day: Tuesday and Thursday 7:30 AM to 4:00 PM Bilal Lateef, MD - Appointments are:  Medication management: Tuesday and Thursday 8:00 AM to 4:00 PM Procedure day: Monday and Wednesday 7:30 AM to 4:00 PM (Last update: 09/30/2017) ____________________________________________________________________________________________   ____________________________________________________________________________________________  CANNABIDIOL (AKA: CBD Oil or Pills)  Applies to: All patients receiving prescriptions of controlled substances (written and/or electronic).  General Information: Cannabidiol (CBD) was discovered in 1940. It is one of some 113 identified cannabinoids in cannabis (Marijuana) plants, accounting for up to 40% of the plant's extract. As of 2018, preliminary clinical research on cannabidiol included studies of anxiety, cognition, movement disorders, and pain.  Cannabidiol is consummed in multiple ways, including inhalation of cannabis smoke or vapor, as an aerosol spray into the cheek, and by mouth. It may be supplied as CBD oil containing CBD as the active ingredient (no added tetrahydrocannabinol (THC) or terpenes), a full-plant CBD-dominant hemp extract oil, capsules, dried cannabis, or as a liquid solution. CBD is thought not have the same psychoactivity as THC, and may affect the actions of THC. Studies suggest that CBD may interact with different biological targets, including cannabinoid receptors and other neurotransmitter receptors. As of 2018 the mechanism of action for its biological effects has not been determined.  In the [place], cannabidiol has a limited  approval by the Food and Drug Administration (FDA) for treatment of only two types   of epilepsy disorders. The side effects of long-term use of the drug include somnolence, decreased appetite, diarrhea, fatigue, malaise, weakness, sleeping problems, and others.  CBD remains a Schedule I drug prohibited for any use.  Legality: Some manufacturers ship CBD products nationally, an illegal action which the FDA has not enforced in 2018, with CBD remaining the subject of an FDA investigational new drug evaluation, and is not considered legal as a dietary supplement or food ingredient as of December 2018. Federal illegality has made it difficult historically to conduct research on CBD. CBD is openly sold in head shops and health food stores in some states where such sales have not been explicitly legalized.  Warning: Because it is not FDA approved for general use or treatment of pain, it is not required to undergo the same manufacturing controls as prescription drugs.  This means that the available cannabidiol (CBD) may be contaminated with THC.  If this is the case, it will trigger a positive urine drug screen (UDS) test for cannabinoids (Marijuana).  Because a positive UDS for illicit substances is a violation of our medication agreement, your opioid analgesics (pain medicine) may be permanently discontinued. (Last update: 10/21/2017) ____________________________________________________________________________________________    

## 2019-02-21 NOTE — Progress Notes (Signed)
Pain Management Virtual Encounter Note - Virtual Visit via Telephone Telehealth (real-time audio visits between healthcare provider and patient).   Patient's Phone No. & Preferred Pharmacy:  [phone] (home); [phone] (mobile); (Preferred) [phone] [email or web address]  [id number] - [place], [place] - [address] [place] [address] Phone: [phone] Fax: [phone]    Pre-screening note:  Our staff contacted [name] [name] and offered him an "in person", "face-to-face" appointment versus a telephone encounter. He indicated preferring the telephone encounter, at this time.   Reason for Virtual Visit: COVID-19*  Social distancing based on CDC and AMA recommendations.   I contacted [name] [name] on 02/22/2019 via telephone.      I clearly identified myself as Gaspar Cola, MD. I verified that I was speaking with the correct person using two identifiers (Name: [name] [name], and date of birth: [birth year]).  Advanced Informed Consent I sought verbal advanced consent from [name] [name] for virtual visit interactions. I informed [name] [name] of possible security and privacy concerns, risks, and limitations associated with providing "not-in-person" medical evaluation and management services. I also informed [name] [name] of the availability of "in-person" appointments. Finally, I informed him that there would be a charge for the virtual visit and that he could be  personally, fully or partially, financially responsible for it. [name] [name] expressed understanding and agreed to proceed.   Historic Elements   [name] [name] is a 68 y.o. year old, male patient evaluated today after his last encounter by our practice on 11/23/2018. [name] [name]  has a past medical history of CAD (coronary artery disease), Chronic pain syndrome, GERD (gastroesophageal reflux disease), Hyperlipidemia, Hypertension, and Radiculitis of right cervical region (09/04/2015). He also   has a past surgical history that includes PCI 2009 with stenting of LIMA-LAD anastamosis; Coronary artery bypass graft; Hernia repair; Cholecystectomy; and left heart catheterization with coronary/graft angiogram (N/A, 04/21/2013). [name] [name] has a current medication list which includes the following prescription(s): aspirin, atorvastatin, vitamin d3, clopidogrel, gabapentin, hydrochlorothiazide, isosorbide mononitrate, magnesium oxide, metoprolol succinate, nitroglycerin, oxycodone hcl, oxycodone hcl, oxycodone hcl, pantoprazole, potassium chloride sa, and vitamin b-12. He  reports that he has quit smoking. He has never used smokeless tobacco. He reports that he does not drink alcohol or use drugs. [name] [name] has No Known Allergies.   HPI  Today, he is being contacted for medication management.  The patient indicates having absolutely no problems with the medications.  However, for the past 2 weeks he has been having a lot of problems with his right knee.  He is not sure what caused it and he denies any type of trauma.  He indicates that it has gotten to the point where he is having difficulty ambulating.  Today I will go ahead and put an order for some x-rays on the right knee and once he has those done, I will talk to him again and consider the possibility of some intra-articular knee injections.  We would need to stop the Plavix prior to the injection.  Pharmacotherapy Assessment  Analgesic: Oxycodone IR 10 mg, 1 tab PO 5X daily (50 mg/day of oxycodone)(enough to last until 06/10/2019) MME/day: 75 mg/day.   Monitoring: Pharmacotherapy: No side-effects or adverse reactions reported. [place] PMP: PDMP reviewed during this encounter.       Compliance: No problems identified. Effectiveness: Clinically acceptable. Plan: Refer to "POC".  Pertinent Labs   SAFETY SCREENING Profile No results found for: SARSCOV2NAA, COVIDSOURCE, [place], [place],  HCVAB, HIV, PREGTESTUR Renal Function Lab Results   Component Value Date   BUN 9 05/04/2018   CREATININE 0.93 05/04/2018   BCR 10 05/04/2018   GFRAA 99 05/04/2018   GFRNONAA 85 05/04/2018   Hepatic Function Lab Results  Component Value Date   AST 38 04/25/2018   ALT 18 04/25/2018   ALBUMIN 3.9 04/25/2018   UDS Summary  Date Value Ref Range Status  05/25/2018 FINAL  Final    Comment:    ==================================================================== TOXASSURE SELECT 13 (MW) ==================================================================== Test                             Result       Flag       Units Drug Present and Declared for Prescription Verification   Oxycodone                      241          EXPECTED   ng/mg creat   Oxymorphone                    858          EXPECTED   ng/mg creat   Noroxycodone                   1143         EXPECTED   ng/mg creat   Noroxymorphone                 582          EXPECTED   ng/mg creat    Sources of oxycodone are scheduled prescription medications.    Oxymorphone, noroxycodone, and noroxymorphone are expected    metabolites of oxycodone. Oxymorphone is also available as a    scheduled prescription medication. ==================================================================== Test                      Result    Flag   Units      Ref Range   Creatinine              113              mg/dL      >=44>=20 ==================================================================== Declared Medications:  The flagging and interpretation on this report are based on the  following declared medications.  Unexpected results may arise from  inaccuracies in the declared medications.  **Note: The testing scope of this panel includes these medications:  Oxycodone  **Note: The testing scope of this panel does not include following  reported medications:  Aspirin (Aspirin 81)  Atorvastatin  Cholecalciferol  Clopidogrel (Plavix)  Cyanocobalamin  Gabapentin (Neurontin)  Hydrochlorothiazide  (Hydrodiuril)  Isosorbide (Imdur)  Magnesium (Mag-Ox)  Metoprolol (Toprol)  Nitroglycerin (Nitrostat)  Pantoprazole (Protonix)  Potassium (K-Dur) ==================================================================== For clinical consultation, please call [phone]. ====================================================================    Note: Above Lab results reviewed.  Recent imaging  DG Cervical Spine With Flex & Extend CLINICAL DATA:  Left posterior neck pain extending down the left shoulder for 1 year. Radiculitis.  EXAM: CERVICAL SPINE COMPLETE WITH FLEXION AND EXTENSION VIEWS (7 views)  COMPARISON:  Cervical spine MRI from 10/31/2015  FINDINGS: No malalignment or prevertebral soft tissue swelling. Preserved intervertebral disc spaces.  The patient has restricted flexion and restricted extension of the cervical spine. No abnormal cervical motion with flexion or extension. No subluxation noted.  There is mild  uncinate and facet spurring bilaterally at the C3-4 level without overt osseous foraminal impingement.  Upper sternotomy wires noted. No appreciable cervical spine fracture.  IMPRESSION: 1. No subluxation or significant intervertebral disc space loss. 2. No abnormal motion with flexion or extension, although both flexion and extension are mildly restricted, with reduced range of both flexion and extension of the neck. 3. Mild uncinate and facet spurring bilaterally at C3-4 without overt osseous foraminal impingement.  Electronically Signed   By: Gaylyn RongWalter  Liebkemann M.D.   On: 05/25/2018 16:30  Assessment  The primary encounter diagnosis was Chronic pain syndrome. Diagnoses of Acute pain of right knee, Chronic lower extremity pain (Primary Area of Pain) (Bilateral) (L>R), Chronic gout without tophus, unspecified cause, unspecified site, Gastroesophageal reflux disease without esophagitis, Neurogenic pain, Pharmacologic therapy, Disorder of skeletal  system, Problems influencing health status, Vitamin B12 deficiency, Vitamin D insufficiency, and Chronic anticoagulation (Plavix) were also pertinent to this visit.  Plan of Care  I have discontinued Walker KehrWillie G. Amey's Oxycodone HCl, Oxycodone HCl, Oxycodone HCl, Oxycodone HCl, and Oxycodone HCl. I have also changed his gabapentin and Oxycodone HCl. Additionally, I am having him start on Oxycodone HCl and Oxycodone HCl. Lastly, I am having him maintain his potassium chloride SA, nitroGLYCERIN, aspirin, vitamin B-12, clopidogrel, atorvastatin, isosorbide mononitrate, hydrochlorothiazide, metoprolol succinate, pantoprazole, magnesium oxide, and Vitamin D3.  Pharmacotherapy (Medications Ordered): Meds ordered this encounter  Medications  . magnesium oxide (MAG-OX) 400 MG tablet    Sig: Take 1 tablet (400 mg total) by mouth 2 (two) times daily.    Dispense:  180 tablet    Refill:  0    Fill one day early if pharmacy is closed on scheduled refill date. May substitute for generic if available.  . gabapentin (NEURONTIN) 300 MG capsule    Sig: Take 1 capsule (300 mg total) by mouth 3 (three) times daily AND 3 capsules (900 mg total) at bedtime.    Dispense:  180 capsule    Refill:  2    Fill one day early if pharmacy is closed on scheduled refill date. May substitute for generic if available.  . Oxycodone HCl 10 MG TABS    Sig: Take 1 tablet (10 mg total) by mouth 5 (five) times daily. Must last 30 days    Dispense:  150 tablet    Refill:  0    Chronic Pain: STOP Act (Not applicable) Fill 1 day early if closed on refill date. Do not fill until: 03/12/2019. To last until: 04/11/2019. Avoid benzodiazepines within 8 hours of opioids  . Cholecalciferol (VITAMIN D3) 50 MCG (2000 UT) capsule    Sig: Take 1 capsule (2,000 Units total) by mouth daily.    Dispense:  90 capsule    Refill:  0    Fill one day early if pharmacy is closed on scheduled refill date. May substitute for generic if available.  .  Oxycodone HCl 10 MG TABS    Sig: Take 1 tablet (10 mg total) by mouth 5 (five) times daily. Must last 30 days    Dispense:  150 tablet    Refill:  0    Chronic Pain: STOP Act (Not applicable) Fill 1 day early if closed on refill date. Do not fill until: 04/11/2019. To last until: 05/11/2019. Avoid benzodiazepines within 8 hours of opioids  . Oxycodone HCl 10 MG TABS    Sig: Take 1 tablet (10 mg total) by mouth 5 (five) times daily. Must last 30 days  Dispense:  150 tablet    Refill:  0    Chronic Pain: STOP Act (Not applicable) Fill 1 day early if closed on refill date. Do not fill until: 05/11/2019. To last until: 06/10/2019. Avoid benzodiazepines within 8 hours of opioids   Orders:  Orders Placed This Encounter  Procedures  . DG Knee 1-2 Views Right    Standing Status:   Future    Standing Expiration Date:   02/22/2020    Order Specific Question:   Reason for Exam (SYMPTOM  OR DIAGNOSIS REQUIRED)    Answer:   Right knee pain/arthralgia    Order Specific Question:   Preferred imaging location?    Answer:   [facility]    Order Specific Question:   Call Results- Best Contact Number?    Answer:   [phone] (Pain Clinic facility) (Dr. Laban Emperor)  . ToxASSURE Select 13 (MW), Urine    Volume: 30 ml(s). Minimum 3 ml of urine is needed. Document temperature of fresh sample. Indications: Long term (current) use of opiate analgesic (Z79.891)  . Comp. Metabolic Panel (12)    With GFR. Indications: Chronic Pain Syndrome (G89.4) & Pharmacotherapy (Q73.419)    Order Specific Question:   Has the patient fasted?    Answer:   No    Order Specific Question:   CC Results    Answer:   PCP-NURSE [[id number]]  . Magnesium    Indication: Pharmacologic therapy (F79.024)    Order Specific Question:   CC Results    Answer:   PCP-NURSE [[id number]]  . Vitamin B12    Indication: Pharmacologic therapy (G99.242).    Order Specific Question:   CC Results    Answer:   PCP-NURSE [[id number]]  . Sedimentation  rate    Indication: Disorder of skeletal system (M89.9)    Order Specific Question:   CC Results    Answer:   PCP-NURSE [[id number]]  . 25-Hydroxyvitamin D Lcms D2+D3    Indication: Disorder of skeletal system (M89.9).    Order Specific Question:   CC Results    Answer:   PCP-NURSE [[id number]]  . C-reactive protein    Indication: Problems influencing health status (Z78.9)    Order Specific Question:   CC Results    Answer:   PCP-NURSE [[id number]]  . Uric acid    Order Specific Question:   CC Results    Answer:   PCP-NURSE [[id number]]  . Blood Thinner Instructions to Nursing    If unable to stop, ask if Lovenox-bridge therapy may be possible, and if so, request their assistance in implementing it.    Scheduling Instructions:     Contact the physician prescribing the blood thinner and request clearance to stop it for time period stipulated below.     If approved by prescribing physician, stop Plavix (Clopidogrel) x 7-10 days prior to procedure or surgery.  . Blood Thinner Instructions to Nursing    If the patient requires a Lovenox-bridge therapy, make sure arrangements are made to institute it with the assistance of the PCP.    Standing Status:   Standing    Number of Occurrences:   36    Standing Expiration Date:   08/24/2020    Scheduling Instructions:     Always stop the Plavix (Clopidogrel) x 7-10 days prior to procedure or surgery.   Follow-up plan:   Return in about 15 weeks (around 06/07/2019) for (VV), E/M (MM).      Interventional therapies:  Considering:   NOTE: PLAVIX ANTICOAGULATION (  Stop: 7-10 days  Restart: 2 hrs) Diagnostic right intra-articular knee joint injection #1 (w/ L.A. + steroid)  Therapeutic series of 5 intra-articular Hyalgan knee injections #1  Diagnostic right-sided genicular nerve block #1  Possible right-sided genicular nerve RFA #1    Palliative PRN treatment(s):   None at this time.     Recent Visits No visits were found meeting these conditions.  Showing  recent visits within past 90 days and meeting all other requirements   Today's Visits Date Type Provider Dept  02/22/19 Office Visit Delano MetzNaveira, Olamide Lahaie, MD [facility]  Showing today's visits and meeting all other requirements   Future Appointments No visits were found meeting these conditions.  Showing future appointments within next 90 days and meeting all other requirements   I discussed the assessment and treatment plan with the patient. The patient was provided an opportunity to ask questions and all were answered. The patient agreed with the plan and demonstrated an understanding of the instructions.  Patient advised to call back or seek an in-person evaluation if the symptoms or condition worsens.  Total duration of non-face-to-face encounter: 15 minutes.  Note by: Oswaldo DoneFrancisco A Jamariah Tony, MD Date: 02/22/2019; Time: 10:15 AM  Note: This dictation was prepared with Dragon dictation. Any transcriptional errors that may result from this process are unintentional.  Disclaimer:  * Given the special circumstances of the COVID-19 pandemic, the federal government has announced that the Office for Civil Rights (OCR) will exercise its enforcement discretion and will not impose penalties on physicians using telehealth in the event of noncompliance with regulatory requirements under the [employer] Insurance Portability and Accountability Act (HIPAA) in connection with the good faith provision of telehealth during the COVID-19 national public health emergency. (AMA)

## 2019-02-22 ENCOUNTER — Other Ambulatory Visit: Payer: Self-pay

## 2019-02-22 ENCOUNTER — Ambulatory Visit: Payer: Medicare Other | Attending: Nurse Practitioner | Admitting: Pain Medicine

## 2019-02-22 DIAGNOSIS — M1A9XX0 Chronic gout, unspecified, without tophus (tophi): Secondary | ICD-10-CM | POA: Diagnosis not present

## 2019-02-22 DIAGNOSIS — M899 Disorder of bone, unspecified: Secondary | ICD-10-CM

## 2019-02-22 DIAGNOSIS — E559 Vitamin D deficiency, unspecified: Secondary | ICD-10-CM

## 2019-02-22 DIAGNOSIS — G894 Chronic pain syndrome: Secondary | ICD-10-CM

## 2019-02-22 DIAGNOSIS — K219 Gastro-esophageal reflux disease without esophagitis: Secondary | ICD-10-CM

## 2019-02-22 DIAGNOSIS — E538 Deficiency of other specified B group vitamins: Secondary | ICD-10-CM

## 2019-02-22 DIAGNOSIS — M79605 Pain in left leg: Secondary | ICD-10-CM

## 2019-02-22 DIAGNOSIS — Z79899 Other long term (current) drug therapy: Secondary | ICD-10-CM

## 2019-02-22 DIAGNOSIS — G8929 Other chronic pain: Secondary | ICD-10-CM

## 2019-02-22 DIAGNOSIS — M792 Neuralgia and neuritis, unspecified: Secondary | ICD-10-CM

## 2019-02-22 DIAGNOSIS — Z7901 Long term (current) use of anticoagulants: Secondary | ICD-10-CM

## 2019-02-22 DIAGNOSIS — M25561 Pain in right knee: Secondary | ICD-10-CM | POA: Diagnosis not present

## 2019-02-22 DIAGNOSIS — Z789 Other specified health status: Secondary | ICD-10-CM

## 2019-02-22 MED ORDER — OXYCODONE HCL 10 MG PO TABS: 10.0000 mg | ORAL_TABLET | Freq: Every day | ORAL | 0 refills | Status: DC

## 2019-02-22 MED ORDER — GABAPENTIN 300 MG PO CAPS: ORAL_CAPSULE | ORAL | 2 refills | Status: DC

## 2019-02-22 MED ORDER — MAGNESIUM OXIDE 400 MG PO TABS: 400.0000 mg | ORAL_TABLET | Freq: Two times a day (BID) | ORAL | 0 refills | Status: DC

## 2019-02-22 MED ORDER — VITAMIN D3 50 MCG (2000 UT) PO CAPS: 2000.0000 [IU] | ORAL_CAPSULE | Freq: Every day | ORAL | 0 refills | Status: DC

## 2019-06-01 ENCOUNTER — Encounter: Payer: Self-pay | Admitting: Pain Medicine

## 2019-06-05 ENCOUNTER — Telehealth: Payer: Self-pay | Admitting: *Deleted

## 2019-06-05 ENCOUNTER — Ambulatory Visit: Payer: Medicare Other | Attending: Pain Medicine | Admitting: Pain Medicine

## 2019-06-05 ENCOUNTER — Other Ambulatory Visit: Payer: Self-pay

## 2019-06-05 DIAGNOSIS — M65341 Trigger finger, right ring finger: Secondary | ICD-10-CM

## 2019-06-05 DIAGNOSIS — M542 Cervicalgia: Secondary | ICD-10-CM | POA: Diagnosis not present

## 2019-06-05 DIAGNOSIS — M4802 Spinal stenosis, cervical region: Secondary | ICD-10-CM | POA: Diagnosis not present

## 2019-06-05 DIAGNOSIS — E559 Vitamin D deficiency, unspecified: Secondary | ICD-10-CM

## 2019-06-05 DIAGNOSIS — M65331 Trigger finger, right middle finger: Secondary | ICD-10-CM

## 2019-06-05 DIAGNOSIS — M5412 Radiculopathy, cervical region: Secondary | ICD-10-CM | POA: Diagnosis not present

## 2019-06-05 DIAGNOSIS — K219 Gastro-esophageal reflux disease without esophagitis: Secondary | ICD-10-CM

## 2019-06-05 DIAGNOSIS — M792 Neuralgia and neuritis, unspecified: Secondary | ICD-10-CM | POA: Diagnosis not present

## 2019-06-05 DIAGNOSIS — G894 Chronic pain syndrome: Secondary | ICD-10-CM

## 2019-06-05 MED ORDER — OXYCODONE HCL 10 MG PO TABS: 10.0000 mg | ORAL_TABLET | Freq: Every day | ORAL | 0 refills | Status: DC

## 2019-06-05 MED ORDER — VITAMIN D3 50 MCG (2000 UT) PO CAPS: 2000.0000 [IU] | ORAL_CAPSULE | Freq: Every day | ORAL | 3 refills | Status: DC

## 2019-06-05 MED ORDER — GABAPENTIN 300 MG PO CAPS: ORAL_CAPSULE | ORAL | 2 refills | Status: DC

## 2019-06-05 MED ORDER — MAGNESIUM OXIDE 400 MG PO TABS: 400.0000 mg | ORAL_TABLET | Freq: Two times a day (BID) | ORAL | 3 refills | Status: DC

## 2019-06-05 NOTE — Patient Instructions (Signed)

## 2019-06-05 NOTE — Progress Notes (Signed)
Pain Management Virtual Encounter Note - Virtual Visit via Telephone Telehealth (real-time audio visits between healthcare provider and patient).   Patient's Phone No. & Preferred Pharmacy:  [phone] (home); [phone] (mobile); (Preferred) [phone] [name]  [id number] - [place], [place] - [address] [place] [address] Phone: [phone] Fax: [phone]    Pre-screening note:  Our staff contacted [name] [name] and offered him an "in person", "face-to-face" appointment versus a telephone encounter. He indicated preferring the telephone encounter, at this time.   Reason for Virtual Visit: COVID-19*  Social distancing based on CDC and AMA recommendations.   I contacted [name] [name] on 06/05/2019 via telephone.      I clearly identified myself as [name] Done, MD. I verified that I was speaking with the correct person using two identifiers (Name: [name] [name], and date of birth: 1950/09/29).  Advanced Informed Consent I sought verbal advanced consent from [name] [name] for virtual visit interactions. I informed [name] [name] of possible security and privacy concerns, risks, and limitations associated with providing "not-in-person" medical evaluation and management services. I also informed [name] [name] of the availability of "in-person" appointments. Finally, I informed him that there would be a charge for the virtual visit and that he could be  personally, fully or partially, financially responsible for it. [name] [name] expressed understanding and agreed to proceed.   Historic Elements   [name] [name] is a 68 y.o. year old, male patient evaluated today after his last encounter by our practice on 02/22/2019. [name] [name]  has a past medical history of CAD (coronary artery disease), Chronic pain syndrome, GERD (gastroesophageal reflux disease), Hyperlipidemia, Hypertension, and Radiculitis of right cervical region (09/04/2015). He also   has a past surgical history that includes PCI 2009 with stenting of LIMA-LAD anastamosis; Coronary artery bypass graft; Hernia repair; Cholecystectomy; and left heart catheterization with coronary/graft angiogram (N/A, 04/21/2013). [name] [name] has a current medication list which includes the following prescription(s): aspirin, atorvastatin, vitamin d3, clopidogrel, gabapentin, hydrochlorothiazide, isosorbide mononitrate, magnesium oxide, metoprolol succinate, nitroglycerin, oxycodone hcl, oxycodone hcl, oxycodone hcl, pantoprazole, potassium chloride sa, and vitamin b-12. He  reports that he has quit smoking. He has never used smokeless tobacco. He reports that he does not drink alcohol or use drugs. [name] [name] has No Known Allergies.   HPI  Today, he is being contacted for medication management.  The patient indicates doing well with the current medication regimen. No adverse reactions or side effects reported to the medications.  The patient indicates that approximately 3 years ago he was having more problems with the neck area, but because his wife was also having some issues he decided to hold and take care of her first.  The pain actually improved to a certain degree and therefore he let her go until about 6 months ago when it again started flaring up.  On 10/31/2015 he had a cervical MRI that I had ordered.  This MRI showed: IMPRESSION: 1. 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 2. 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 3. Congenitally narrow spinal canal.  He would like to have this MRI repeated since his condition has been getting worse and he has neck pain with bilateral shoulder and upper extremity problems.  However, he indicates that the worst is his left shoulder that is constantly in pain and numb.  In the case of the right upper extremity he is also experiencing pain going all the  way down into his hand but he describes having problems with his middle finger and ring  finger that seem to be "locking".  This would suggest that he has a trigger finger affecting the middle and ring finger on that right hand.  However this may be superimposed on his radicular symptoms.  Today I am going to order a repeat MRI since he describes symptoms to be getting worse and I will be referring him to a neurosurgeon.  Should he need any injections, I will be more than happy to assist him with this and therefore I will be putting a standing order for a cervical epidural steroid injection.  He is on chronic Plavix anticoagulation therefore he would need to stop his blood thinners for 7 to 10 days prior to any axial spinal injection.  Pharmacotherapy Assessment  Analgesic: Oxycodone IR 10 mg, 1 tab PO 5X daily (50 mg/day of oxycodone) MME/day: 75 mg/day.   Monitoring: Pharmacotherapy: No side-effects or adverse reactions reported. [place] PMP: PDMP reviewed during this encounter.       Compliance: No problems identified. Effectiveness: Clinically acceptable. Plan: Refer to "POC".  UDS:  Summary  Date Value Ref Range Status  05/25/2018 FINAL  Final    Comment:    ==================================================================== TOXASSURE SELECT 13 (MW) ==================================================================== Test                             Result       Flag       Units Drug Present and Declared for Prescription Verification   Oxycodone                      241          EXPECTED   ng/mg creat   Oxymorphone                    858          EXPECTED   ng/mg creat   Noroxycodone                   1143         EXPECTED   ng/mg creat   Noroxymorphone                 582          EXPECTED   ng/mg creat    Sources of oxycodone are scheduled prescription medications.    Oxymorphone, noroxycodone, and noroxymorphone are expected    metabolites of oxycodone. Oxymorphone is also available as a    scheduled prescription  medication. ==================================================================== Test                      Result    Flag   Units      Ref Range   Creatinine              113              mg/dL      >=16>=20 ==================================================================== Declared Medications:  The flagging and interpretation on this report are based on the  following declared medications.  Unexpected results may arise from  inaccuracies in the declared medications.  **Note: The testing scope of this panel includes these medications:  Oxycodone  **Note: The testing scope of this panel does not include following  reported medications:  Aspirin (Aspirin 81)  Atorvastatin  Cholecalciferol  Clopidogrel (  Plavix)  Cyanocobalamin  Gabapentin (Neurontin)  Hydrochlorothiazide (Hydrodiuril)  Isosorbide (Imdur)  Magnesium (Mag-Ox)  Metoprolol (Toprol)  Nitroglycerin (Nitrostat)  Pantoprazole (Protonix)  Potassium (K-Dur) ==================================================================== For clinical consultation, please call [phone]. ====================================================================    Laboratory Chemistry Profile (12 mo)  Renal: No results found for requested labs within last 8760 hours.  Lab Results  Component Value Date   GFR 108.38 12/07/2013   GFRAA 99 05/04/2018   GFRNONAA 85 05/04/2018   Hepatic: No results found for requested labs within last 8760 hours. Lab Results  Component Value Date   AST 38 04/25/2018   ALT 18 04/25/2018   Other: No results found for requested labs within last 8760 hours. Note: Above Lab results reviewed.  Imaging  Last 90 days:  No results found.  Assessment  The primary encounter diagnosis was Shoulder radicular pain and numbness (Left). Diagnoses of Chronic upper extremity radicular pain (Left), Cervicalgia (Bilateral) (L>R), Cervical spinal stenosis (degenerative cord compression C4-5), Cervical foraminal  stenosis (Bilateral) (C3-4, C4-5), Trigger finger of ring finger (Right), Trigger finger of middle finger (Right), Chronic pain syndrome, Gastroesophageal reflux disease without esophagitis, Neurogenic pain, and Vitamin D insufficiency were also pertinent to this visit.  Plan of Care  I am having [name] [name]. [name] [name] start on Oxycodone HCl and Oxycodone HCl. I am also having him maintain his potassium chloride SA, nitroGLYCERIN, aspirin, vitamin B-12, clopidogrel, atorvastatin, isosorbide mononitrate, hydrochlorothiazide, metoprolol succinate, pantoprazole, magnesium oxide, gabapentin, Vitamin D3, and Oxycodone HCl.  Pharmacotherapy (Medications Ordered): Meds ordered this encounter  Medications  . magnesium oxide (MAG-OX) 400 MG tablet    Sig: Take 1 tablet (400 mg total) by mouth 2 (two) times daily.    Dispense:  180 tablet    Refill:  3    Fill one day early if pharmacy is closed on scheduled refill date. May substitute for generic if available.  . gabapentin (NEURONTIN) 300 MG capsule    Sig: Take 1 capsule (300 mg total) by mouth 3 (three) times daily AND 3 capsules (900 mg total) at bedtime.    Dispense:  180 capsule    Refill:  2    Fill one day early if pharmacy is closed on scheduled refill date. May substitute for generic if available.  . Cholecalciferol (VITAMIN D3) 50 MCG (2000 UT) capsule    Sig: Take 1 capsule (2,000 Units total) by mouth daily.    Dispense:  90 capsule    Refill:  3    Fill one day early if pharmacy is closed on scheduled refill date. May substitute for generic if available.  . Oxycodone HCl 10 MG TABS    Sig: Take 1 tablet (10 mg total) by mouth 5 (five) times daily. Must last 30 days    Dispense:  150 tablet    Refill:  0    Chronic Pain: STOP Act (Not applicable) Fill 1 day early if closed on refill date. Do not fill until: 06/10/2019. To last until: 07/10/2019. Avoid benzodiazepines within 8 hours of opioids  . Oxycodone HCl 10 MG TABS    Sig: Take 1 tablet  (10 mg total) by mouth 5 (five) times daily. Must last 30 days    Dispense:  150 tablet    Refill:  0    Chronic Pain: STOP Act (Not applicable) Fill 1 day early if closed on refill date. Do not fill until: 07/10/2019. To last until: 08/09/2019. Avoid benzodiazepines within 8 hours of opioids  . Oxycodone HCl 10 MG TABS  Sig: Take 1 tablet (10 mg total) by mouth 5 (five) times daily. Must last 30 days    Dispense:  150 tablet    Refill:  0    Chronic Pain: STOP Act (Not applicable) Fill 1 day early if closed on refill date. Do not fill until: 08/09/2019. To last until: 09/08/2019. Avoid benzodiazepines within 8 hours of opioids   Orders:  Orders Placed This Encounter  Procedures  . Cervical Epidural Injection    Procedure: Cervical Epidural Steroid Injection/Block Purpose: Therapeutic Indication(s): Radiculitis and/or cervicalgia associater with cervical degenerative disc disease.    Standing Status:   Standing    Number of Occurrences:   6    Standing Expiration Date:   12/02/2020    Scheduling Instructions:     Level(s): C7-T1     Laterality: Left-sided     Sedation: Patient's choice.     Timeframe: PRN    Order Specific Question:   Where will this procedure be performed?    Answer:   [facility]    Comments:   by Dr. Laban EmperorNaveira  . MR CERVICAL SPINE WO CONTRAST    In addition to any acute findings, please report on degenerative changes related to: (Please specify level(s)) (1) ROM & instability (>414mm displacement) (2) Facet joint (Zygoapophyseal Joint) (3) DDD and/or IVDD (4) Pars defects (5) Previous surgical changes (Include description of hardware and hardware status, if present) (6) Presence and degree of spondylolisthesis, spondylosis, and/or spondyloarthropathies)  (7) Old Fractures (8) Demineralization (9) Additional bone pathology (10) Stenosis (Central, Lateral Recess, Foraminal) (11) If at all possible, please provide AP diameter (mm) of foraminal and/or central  canal.    Standing Status:   Future    Standing Expiration Date:   09/05/2019    Order Specific Question:   What is the patient's sedation requirement?    Answer:   No Sedation    Order Specific Question:   Does the patient have a pacemaker or implanted devices?    Answer:   No    Order Specific Question:   Preferred imaging location?    Answer:   [facility] (table limit-350lbs)    Order Specific Question:   Call Results- Best Contact Number?    Answer:   [phone] [facility])    Order Specific Question:   Radiology Contrast Protocol - do NOT remove file path    Answer:   [email or web address]    Order Specific Question:   ** REASON FOR EXAM (FREE TEXT)    Answer:   Neck Pain & Radiculitis  . Ambulatory referral to Neurosurgery    Referral Priority:   Routine    Referral Type:   Surgical    Referral Reason:   Specialty Services Required    Requested Specialty:   Neurosurgery    Number of Visits Requested:   1   Follow-up plan:   Return in about 3 months (around 09/06/2019) for (VV), (MM) & F/U s/p C-MRI 7 NS-Referral, PRN Procedure(s): (L) CESI, (Blood-thinner Protocol).      Interventional therapies:  Considering:   NOTE: PLAVIX ANTICOAGULATION (Stop: 7-10 days  Restart: 2 hrs) Diagnostic right intra-articular knee joint injection #1 (w/ L.A. + steroid)  Therapeutic series of 5 intra-articular Hyalgan knee injections #1  Diagnostic right-sided genicular nerve block #1  Possible right-sided genicular nerve RFA #1    Palliative PRN treatment(s):   None at this time.      Recent Visits No visits were found meeting these conditions.  Showing recent visits within past 90 days and meeting all other requirements   Today's Visits Date Type Provider Dept  06/05/19 Telemedicine Delano Metz, MD [facility]  Showing today's visits and meeting all other requirements   Future Appointments No visits were found meeting these  conditions.  Showing future appointments within next 90 days and meeting all other requirements   I discussed the assessment and treatment plan with the patient. The patient was provided an opportunity to ask questions and all were answered. The patient agreed with the plan and demonstrated an understanding of the instructions.  Patient advised to call back or seek an in-person evaluation if the symptoms or condition worsens.  Total duration of non-face-to-face encounter: 18 minutes.  Note by: [name] Done, MD Date: 06/05/2019; Time: 11:59 AM  Note: This dictation was prepared with Dragon dictation. Any transcriptional errors that may result from this process are unintentional.  Disclaimer:  * Given the special circumstances of the COVID-19 pandemic, the federal government has announced that the Office for Civil Rights (OCR) will exercise its enforcement discretion and will not impose penalties on physicians using telehealth in the event of noncompliance with regulatory requirements under the [employer] Portability and Accountability Act (HIPAA) in connection with the good faith provision of telehealth during the COVID-19 national public health emergency. (AMA)

## 2019-06-16 ENCOUNTER — Other Ambulatory Visit: Payer: Self-pay

## 2019-06-16 ENCOUNTER — Ambulatory Visit
Admission: RE | Admit: 2019-06-16 | Discharge: 2019-06-16 | Disposition: A | Discharge status: Home / Self Care | Payer: Medicare Other | Source: Ambulatory Visit | Attending: Pain Medicine | Admitting: Pain Medicine

## 2019-06-16 DIAGNOSIS — M4802 Spinal stenosis, cervical region: Secondary | ICD-10-CM | POA: Diagnosis present

## 2019-06-16 DIAGNOSIS — M542 Cervicalgia: Secondary | ICD-10-CM | POA: Insufficient documentation

## 2019-06-16 DIAGNOSIS — M25561 Pain in right knee: Secondary | ICD-10-CM | POA: Diagnosis present

## 2019-06-16 DIAGNOSIS — M5412 Radiculopathy, cervical region: Secondary | ICD-10-CM | POA: Diagnosis present

## 2019-06-16 DIAGNOSIS — M792 Neuralgia and neuritis, unspecified: Secondary | ICD-10-CM | POA: Insufficient documentation

## 2019-06-20 DIAGNOSIS — N400 Enlarged prostate without lower urinary tract symptoms: Secondary | ICD-10-CM | POA: Insufficient documentation

## 2019-06-20 DIAGNOSIS — N529 Male erectile dysfunction, unspecified: Secondary | ICD-10-CM | POA: Insufficient documentation

## 2019-07-04 ENCOUNTER — Other Ambulatory Visit: Payer: Self-pay

## 2019-07-04 ENCOUNTER — Other Ambulatory Visit: Payer: Medicare Other

## 2019-07-04 DIAGNOSIS — I2510 Atherosclerotic heart disease of native coronary artery without angina pectoris: Secondary | ICD-10-CM

## 2019-07-04 DIAGNOSIS — E782 Mixed hyperlipidemia: Secondary | ICD-10-CM

## 2019-07-04 DIAGNOSIS — I10 Essential (primary) hypertension: Secondary | ICD-10-CM

## 2019-07-04 LAB — CBC WITH DIFFERENTIAL/PLATELET
Basophils Absolute: 0 10*3/uL (ref 0.0–0.2)
Basos: 0 %
EOS (ABSOLUTE): 0.1 10*3/uL (ref 0.0–0.4)
Eos: 2 %
Hematocrit: 39.4 % (ref 37.5–51.0)
Hemoglobin: 12.9 g/dL — ABNORMAL LOW (ref 13.0–17.7)
Immature Grans (Abs): 0 10*3/uL (ref 0.0–0.1)
Immature Granulocytes: 0 %
Lymphocytes Absolute: 1.4 10*3/uL (ref 0.7–3.1)
Lymphs: 32 %
MCH: 27.6 pg (ref 26.6–33.0)
MCHC: 32.7 g/dL (ref 31.5–35.7)
MCV: 84 fL (ref 79–97)
Monocytes Absolute: 0.4 10*3/uL (ref 0.1–0.9)
Monocytes: 9 %
Neutrophils Absolute: 2.5 10*3/uL (ref 1.4–7.0)
Neutrophils: 57 %
Platelets: 153 10*3/uL (ref 150–450)
RBC: 4.67 x10E6/uL (ref 4.14–5.80)
RDW: 13.6 % (ref 11.6–15.4)
WBC: 4.3 10*3/uL (ref 3.4–10.8)

## 2019-07-04 LAB — LIPID PANEL
Chol/HDL Ratio: 2.2 ratio (ref 0.0–5.0)
Cholesterol, Total: 107 mg/dL (ref 100–199)
HDL: 48 mg/dL (ref >39)
LDL Chol Calc (NIH): 44 mg/dL (ref 0–99)
Triglycerides: 71 mg/dL (ref 0–149)
VLDL Cholesterol Cal: 15 mg/dL (ref 5–40)

## 2019-07-04 LAB — COMPREHENSIVE METABOLIC PANEL
ALT: 26 IU/L (ref 0–44)
AST: 38 IU/L (ref 0–40)
Albumin/Globulin Ratio: 1.2 (ref 1.2–2.2)
Albumin: 3.8 g/dL (ref 3.8–4.8)
Alkaline Phosphatase: 93 IU/L (ref 39–117)
BUN/Creatinine Ratio: 12 (ref 10–24)
BUN: 11 mg/dL (ref 8–27)
Bilirubin Total: 1.1 mg/dL (ref 0.0–1.2)
CO2: 27 mmol/L (ref 20–29)
Calcium: 9.2 mg/dL (ref 8.6–10.2)
Chloride: 100 mmol/L (ref 96–106)
Creatinine, Ser: 0.9 mg/dL (ref 0.76–1.27)
GFR calc Af Amer: 101 mL/min/{1.73_m2} (ref >59)
GFR calc non Af Amer: 87 mL/min/{1.73_m2} (ref >59)
Globulin, Total: 3.1 g/dL (ref 1.5–4.5)
Glucose: 108 mg/dL — ABNORMAL HIGH (ref 65–99)
Potassium: 3.7 mmol/L (ref 3.5–5.2)
Sodium: 139 mmol/L (ref 134–144)
Total Protein: 6.9 g/dL (ref 6.0–8.5)

## 2019-07-09 ENCOUNTER — Other Ambulatory Visit: Payer: Self-pay | Admitting: Cardiovascular Disease

## 2019-07-09 DIAGNOSIS — I25810 Atherosclerosis of coronary artery bypass graft(s) without angina pectoris: Secondary | ICD-10-CM

## 2019-07-09 DIAGNOSIS — I10 Essential (primary) hypertension: Secondary | ICD-10-CM

## 2019-07-09 DIAGNOSIS — E785 Hyperlipidemia, unspecified: Secondary | ICD-10-CM

## 2019-07-10 ENCOUNTER — Other Ambulatory Visit: Payer: Self-pay

## 2019-07-10 ENCOUNTER — Ambulatory Visit: Payer: Medicare Other | Admitting: Cardiovascular Disease

## 2019-07-10 ENCOUNTER — Encounter: Payer: Self-pay | Admitting: Cardiovascular Disease

## 2019-07-10 DIAGNOSIS — E785 Hyperlipidemia, unspecified: Secondary | ICD-10-CM | POA: Diagnosis not present

## 2019-07-10 DIAGNOSIS — I25810 Atherosclerosis of coronary artery bypass graft(s) without angina pectoris: Secondary | ICD-10-CM | POA: Diagnosis not present

## 2019-07-10 DIAGNOSIS — I10 Essential (primary) hypertension: Secondary | ICD-10-CM | POA: Diagnosis not present

## 2019-07-10 MED ORDER — ATORVASTATIN CALCIUM 20 MG PO TABS: 20.0000 mg | ORAL_TABLET | Freq: Every day | ORAL | 3 refills | Status: DC

## 2019-07-10 MED ORDER — ISOSORBIDE MONONITRATE ER 30 MG PO TB24: 30.0000 mg | ORAL_TABLET | Freq: Every day | ORAL | 3 refills | Status: DC

## 2019-07-10 NOTE — Progress Notes (Signed)
Cardiology Office Note:    Date:  07/10/2019   ID:  KOL [name], DOB [birth year], MRN [id number]  PCP:  Gordan Payment., MD  Cardiologist:  [name], MD  Electrophysiologist:  None   Referring MD: Gordan Payment., MD   Chief Complaint  Patient presents with  . Coronary Artery Disease    History of Present Illness:    [name] [name] is a 68 y.o. male with a hx of CAD S/P CABG with early SVG failureand ultimately required PCI of the LIMA-LAD anastomosis. He's been maintained on long term DAPT.He also has a history of hyperlipidemia, hypertension,GERD and chronic pain syndrome.  The patient is here alone today.  He has been feeling relatively well.  He denies chest pain or shortness of breath.  Has had no leg swelling, orthopnea, PND, or heart palpitations.  He has problems with chronic pain related to back and neck trouble.  He also has problems sleeping at night.  Otherwise no specific complaints today.  Past Medical History:  Diagnosis Date  . CAD (coronary artery disease)    CABG  . Chronic pain syndrome   . GERD (gastroesophageal reflux disease)   . Hyperlipidemia   . Hypertension   . Radiculitis of right cervical region 09/04/2015    Past Surgical History:  Procedure Laterality Date  . CHOLECYSTECTOMY    . CORONARY ARTERY BYPASS GRAFT     multivessel  . HERNIA REPAIR    . LEFT HEART CATHETERIZATION WITH CORONARY/GRAFT ANGIOGRAM N/A 04/21/2013   Procedure: LEFT HEART CATHETERIZATION WITH [name];  Surgeon: Micheline Chapman, MD;  Location: [facility] CATH [facility];  Service: Cardiovascular;  Laterality: N/A;  . PCI 2009 with stenting of LIMA-LAD anastamosis      Current Medications: Current Meds  Medication Sig  . aspirin 81 MG tablet Take 1 tablet (81 mg total) by mouth daily.  Marland Kitchen atorvastatin (LIPITOR) 20 MG tablet Take 1 tablet (20 mg total) by mouth daily.  . Cholecalciferol (VITAMIN D3) 50 MCG (2000 UT) capsule Take 1 capsule (2,000 Units total) by  mouth daily.  . clopidogrel (PLAVIX) 75 MG tablet TAKE 1 TABLET BY MOUTH EVERY DAY  . gabapentin (NEURONTIN) 300 MG capsule Take 1 capsule (300 mg total) by mouth 3 (three) times daily AND 3 capsules (900 mg total) at bedtime.  . hydrochlorothiazide (HYDRODIURIL) 25 MG tablet TAKE 1 TABLET BY MOUTH EVERY DAY  . isosorbide mononitrate (IMDUR) 30 MG 24 hr tablet Take 1 tablet (30 mg total) by mouth daily.  . magnesium oxide (MAG-OX) 400 MG tablet Take 1 tablet (400 mg total) by mouth 2 (two) times daily.  . metoprolol succinate (TOPROL-XL) 50 MG 24 hr tablet TAKE 1 TABLET BY MOUTH EVERY DAY WITH OR IMMEDIATELY FOLLOWING A MEAL  . nitroGLYCERIN (NITROSTAT) 0.4 MG SL tablet Place 1 tablet (0.4 mg total) under the tongue every 5 (five) minutes as needed for chest pain.  . Oxycodone HCl 10 MG TABS Take 1 tablet (10 mg total) by mouth 5 (five) times daily. Must last 30 days  . Oxycodone HCl 10 MG TABS Take 1 tablet (10 mg total) by mouth 5 (five) times daily. Must last 30 days  . [START ON 08/09/2019] Oxycodone HCl 10 MG TABS Take 1 tablet (10 mg total) by mouth 5 (five) times daily. Must last 30 days  . pantoprazole (PROTONIX) 40 MG tablet TAKE 1 TABLET BY MOUTH EVERY DAY  . potassium chloride SA (K-DUR,KLOR-CON) 20 MEQ tablet Take 20 mEq  by mouth daily.  . vitamin B-12 (CYANOCOBALAMIN) 1000 MCG tablet Take 2,000 mcg by mouth daily.  . [DISCONTINUED] atorvastatin (LIPITOR) 20 MG tablet TAKE 1 TABLET BY MOUTH EVERY DAY  . [DISCONTINUED] isosorbide mononitrate (IMDUR) 30 MG 24 hr tablet TAKE 1 TABLET (30 MG TOTAL) BY MOUTH DAILY.     Allergies:   Patient has no known allergies.   Social History   Socioeconomic History  . Marital status: Married    Spouse name: Not on file  . Number of children: Not on file  . Years of education: Not on file  . Highest education level: Not on file  Occupational History  . Not on file  Social Needs  . Financial resource strain: Not on file  . Food insecurity     Worry: Not on file    Inability: Not on file  . Transportation needs    Medical: Not on file    Non-medical: Not on file  Tobacco Use  . Smoking status: Former Games developer  . Smokeless tobacco: Never Used  Substance and Sexual Activity  . Alcohol use: No    Alcohol/week: 0.0 standard drinks  . Drug use: No  . Sexual activity: Not on file  Lifestyle  . Physical activity    Days per week: Not on file    Minutes per session: Not on file  . Stress: Not on file  Relationships  . Social Musician on phone: Not on file    Gets together: Not on file    Attends religious service: Not on file    Active member of club or organization: Not on file    Attends meetings of clubs or organizations: Not on file    Relationship status: Not on file  Other Topics Concern  . Not on file  Social History Narrative  . Not on file     Family History: The patient's family history includes Multiple sclerosis in his mother; Stroke in his father.  ROS:   Please see the history of present illness.    All other systems reviewed and are negative.  EKGs/Labs/Other Studies Reviewed:    EKG:  EKG is ordered today.  The ekg ordered today demonstrates normal sinus rhythm 71 bpm, right bundle branch block, no significant change from previous.  Recent Labs: 07/04/2019: ALT 26; BUN 11; Creatinine, Ser 0.90; Hemoglobin 12.9; Platelets 153; Potassium 3.7; Sodium 139  Recent Lipid Panel    Component Value Date/Time   CHOL 107 07/04/2019 1102   TRIG 71 07/04/2019 1102   HDL 48 07/04/2019 1102   CHOLHDL 2.2 07/04/2019 1102   CHOLHDL 3.8 06/18/2016 0954   VLDL 19 06/18/2016 0954   LDLCALC 44 07/04/2019 1102   LDLDIRECT 92.7 07/04/2009 0946    Physical Exam:    VS:  BP 120/72   Pulse 71   Ht 5' 8.5" (1.74 m)   Wt 191 lb 1.9 oz (86.7 kg)   SpO2 96%   BMI 28.64 kg/m     Wt Readings from Last 3 Encounters:  07/10/19 191 lb 1.9 oz (86.7 kg)  10/27/18 194 lb (88 kg)  08/24/18 195 lb (88.5 kg)      GEN: Well nourished, well developed in no acute distress HEENT: Normal NECK: No JVD; No carotid bruits LYMPHATICS: No lymphadenopathy CARDIAC: RRR, no murmurs, rubs, gallops RESPIRATORY:  Clear to auscultation without rales, wheezing or rhonchi  ABDOMEN: Soft, non-tender, non-distended MUSCULOSKELETAL:  No edema; No deformity  SKIN: Warm and dry  NEUROLOGIC:  Alert and oriented x 3 PSYCHIATRIC:  Normal affect   ASSESSMENT:    1. Atherosclerosis of coronary artery bypass graft of native heart without angina pectoris   2. Essential hypertension, benign   3. Hyperlipidemia, unspecified hyperlipidemia type    PLAN:    In order of problems listed above:  1. The patient is stable without symptoms of angina.  He remains on long-term dual antiplatelet therapy with aspirin and clopidogrel because of early bypass graft failure and stenting of the LIMA to LAD anastomotic site. 2. Blood pressure is well controlled on hydrochlorothiazide, isosorbide, and metoprolol. 3. The patient is treated with atorvastatin.  His lipids are excellent with total cholesterol 107, HDL 48, LDL 44, and normal LFTs.   Medication Adjustments/Labs and Tests Ordered: Current medicines are reviewed at length with the patient today.  Concerns regarding medicines are outlined above.  Orders Placed This Encounter  Procedures  . EKG 12-Lead   Meds ordered this encounter  Medications  . atorvastatin (LIPITOR) 20 MG tablet    Sig: Take 1 tablet (20 mg total) by mouth daily.    Dispense:  90 tablet    Refill:  3  . isosorbide mononitrate (IMDUR) 30 MG 24 hr tablet    Sig: Take 1 tablet (30 mg total) by mouth daily.    Dispense:  90 tablet    Refill:  3    Patient Instructions  Medication Instructions:  Your provider recommends that you continue on your current medications as directed. Please refer to the Current Medication list given to you today.   *If you need a refill on your cardiac medications before  your next appointment, please call your pharmacy*  Follow-Up: At [facility], you and your health needs are our priority.  As part of our continuing mission to provide you with exceptional heart care, we have created designated Provider Care Teams.  These Care Teams include your primary Cardiologist (physician) and Advanced Practice Providers (APPs -  Physician Assistants and Nurse Practitioners) who all work together to provide you with the care you need, when you need it. Your next appointment:   6 month(s) The format for your next appointment:   In Person Provider:   You may see Sherren Mocha, MD or one of the following Advanced Practice Providers on your designated Care Team:    Richardson Dopp, PA-C  Vin [place], PA-C  Daune Perch, [place]     Signed, Sherren Mocha, MD  07/10/2019 3:19 PM    [place]

## 2019-07-10 NOTE — Patient Instructions (Signed)
Medication Instructions:  Your provider recommends that you continue on your current medications as directed. Please refer to the Current Medication list given to you today.   *If you need a refill on your cardiac medications before your next appointment, please call your pharmacy*   Follow-Up: At [facility], you and your health needs are our priority.  As part of our continuing mission to provide you with exceptional heart care, we have created designated Provider Care Teams.  These Care Teams include your primary Cardiologist (physician) and Advanced Practice Providers (APPs -  Physician Assistants and Nurse Practitioners) who all work together to provide you with the care you need, when you need it. Your next appointment:   6 month(s) The format for your next appointment:   In Person Provider:   You may see Michael Cooper, MD or one of the following Advanced Practice Providers on your designated Care Team:    Scott Weaver, PA-C  Vin Bhagat, PA-C  Janine Hammond, NP   

## 2019-08-29 ENCOUNTER — Telehealth: Payer: Self-pay | Admitting: Cardiovascular Disease

## 2019-08-29 NOTE — Telephone Encounter (Signed)
We are recommending the COVID-19 vaccine to all of our patients. Cardiac medications (including blood thinners) should not deter anyone from being vaccinated and there is no need to hold any of those medications prior to vaccine administration.     Currently, there is a hotline to call (active 08/11/19) to schedule vaccination appointments as no walk-ins will be accepted.   Number: [phone].    If an appointment is not available please go to [email or web address] to sign up for notification when additional vaccine appointments are available.   If you have further questions or concerns about the vaccine process, please visit [email or web address] or contact your primary care physician.   I have informed patient of instructions.   

## 2019-08-30 ENCOUNTER — Telehealth: Payer: Self-pay

## 2019-08-30 ENCOUNTER — Encounter: Payer: Self-pay | Admitting: Pain Medicine

## 2019-08-30 NOTE — Telephone Encounter (Signed)
Patient called in thinking he had missed a call. He said that the nurse got all his information for the appt Monday, so I told him it was probably a reminder phone call for his appt.

## 2019-09-03 NOTE — Progress Notes (Signed)
Patient: [name] [name]  Service Category: E/M  Provider: Oswaldo Done, MD  DOB: 03/06/1951  DOS: 09/04/2019  Location: Office  MRN: [id number]  Setting: Ambulatory outpatient  Referring Provider: Gordan Payment., MD  Type: Established Patient  Specialty: Interventional Pain Management  PCP: Gordan Payment., MD  Location: Remote location  Delivery: TeleHealth     Virtual Encounter - Pain Management PROVIDER NOTE: Information contained herein reflects review and annotations entered in association with encounter. Interpretation of such information and data should be left to medically-trained personnel. Information provided to patient can be located elsewhere in the medical record under "Patient Instructions". Document created using STT-dictation technology, any transcriptional errors that may result from process are unintentional.    Contact & Pharmacy Preferred: [phone] Home: [phone] (home) Mobile: [phone] (mobile) E-mail: [email or web address]  [id number] - [place], [place] - [address] [place] [address] Phone: [phone] Fax: [phone]   Pre-screening  Mr. Imhoff offered "in-person" vs "virtual" encounter. He indicated preferring virtual for this encounter.   Reason COVID-19*  Social distancing based on CDC and AMA recommendations.   I contacted [name] on 09/04/2019 via telephone.      I clearly identified myself as Oswaldo Done, MD. I verified that I was speaking with the correct person using two identifiers (Name: YANDELL MCJUNKINS, and date of birth: [birth year]).  Consent I sought verbal advanced consent from [name] for virtual visit interactions. I informed Mr. Gaffey of possible security and privacy concerns, risks, and limitations associated with providing "not-in-person" medical evaluation and management services. I also informed Mr. Brander of the availability of "in-person" appointments. Finally, I  informed him that there would be a charge for the virtual visit and that he could be  personally, fully or partially, financially responsible for it. Mr. Bertran expressed understanding and agreed to proceed.   Historic Elements   Mr. MANUELITO POAGE is a 69 y.o. year old, male patient evaluated today after his last encounter by our practice on 08/30/2019. Mr. Purohit  has a past medical history of CAD (coronary artery disease), Chronic pain syndrome, GERD (gastroesophageal reflux disease), Hyperlipidemia, Hypertension, and Radiculitis of right cervical region (09/04/2015). He also  has a past surgical history that includes PCI 2009 with stenting of LIMA-LAD anastamosis; Coronary artery bypass graft; Hernia repair; Cholecystectomy; and left heart catheterization with coronary/graft angiogram (N/A, 04/21/2013). Mr. Bartell has a current medication list which includes the following prescription(s): aspirin, atorvastatin, vitamin d3, clopidogrel, gabapentin, hydrochlorothiazide, isosorbide mononitrate, magnesium oxide, metoprolol succinate, nitroglycerin, [START ON 09/08/2019] oxycodone hcl, [START ON 10/08/2019] oxycodone hcl, [START ON 11/07/2019] oxycodone hcl, pantoprazole, potassium chloride sa, and vitamin b-12. He  reports that he has quit smoking. He has never used smokeless tobacco. He reports that he does not drink alcohol or use drugs. Mr. Murley has No Known Allergies.   HPI  Today, he is being contacted for medication management. The patient indicates doing well with the current medication regimen. No adverse reactions or side effects reported to the medications.  However, he continues to have a lot of pain in the area of the shoulders and the neck.  Today we have reviewed the results of his recent cervical MRI.  This shows that he has a severe C4-5 central spinal stenosis and severe bilateral C4-5 foraminal stenosis.  The same MRI indicates the patient to be having myelomalacia, possibly compressive, at that level.   The patient has had  problems with cervical spinal stenosis for quite some time and he has tried to avoid having surgery.  However, I believe it has gotten to the point where he now realizes that surgery is unavoidable, unless he wants to continue having pain, numbness, weakness, and allowing this to progress further.  Several times I have encouraged him to have a neurosurgical evaluation for decompressive surgery and I believe that at this time this is medically necessary.  He appears to finally agree with me and he is willing to have that referral at this time to have that decompressive surgery.  Pharmacotherapy Assessment  Analgesic: Oxycodone IR 10 mg, 1 tab PO 5X daily (50 mg/day of oxycodone) MME/day: 75 mg/day.   Monitoring: Pharmacotherapy: No side-effects or adverse reactions reported. [place] PMP: PDMP reviewed during this encounter.       Compliance: No problems identified. Effectiveness: Clinically acceptable. Plan: Refer to "POC".  UDS:  Summary  Date Value Ref Range Status  05/25/2018 FINAL  Final    Comment:    ==================================================================== TOXASSURE SELECT 13 (MW) ==================================================================== Test                             Result       Flag       Units Drug Present and Declared for Prescription Verification   Oxycodone                      241          EXPECTED   ng/mg creat   Oxymorphone                    858          EXPECTED   ng/mg creat   Noroxycodone                   1143         EXPECTED   ng/mg creat   Noroxymorphone                 582          EXPECTED   ng/mg creat    Sources of oxycodone are scheduled prescription medications.    Oxymorphone, noroxycodone, and noroxymorphone are expected    metabolites of oxycodone. Oxymorphone is also available as a    scheduled prescription medication. ==================================================================== Test                       Result    Flag   Units      Ref Range   Creatinine              113              mg/dL      >=23 ==================================================================== Declared Medications:  The flagging and interpretation on this report are based on the  following declared medications.  Unexpected results may arise from  inaccuracies in the declared medications.  **Note: The testing scope of this panel includes these medications:  Oxycodone  **Note: The testing scope of this panel does not include following  reported medications:  Aspirin (Aspirin 81)  Atorvastatin  Cholecalciferol  Clopidogrel (Plavix)  Cyanocobalamin  Gabapentin (Neurontin)  Hydrochlorothiazide (Hydrodiuril)  Isosorbide (Imdur)  Magnesium (Mag-Ox)  Metoprolol (Toprol)  Nitroglycerin (Nitrostat)  Pantoprazole (Protonix)  Potassium (K-Dur) ==================================================================== For clinical consultation, please call [phone]. ====================================================================  Laboratory Chemistry Profile (12 mo)  Renal: 07/04/2019: BUN 11; BUN/Creatinine Ratio 12; Creatinine, Ser 0.90  Lab Results  Component Value Date   GFR 108.38 12/07/2013   GFRAA 101 07/04/2019   GFRNONAA 87 07/04/2019   Hepatic: 07/04/2019: Albumin 3.8 Lab Results  Component Value Date   AST 38 07/04/2019   ALT 26 07/04/2019   Other: No results found for requested labs within last 8760 hours.  Note: Above Lab results reviewed.  Imaging  DG Knee 1-2 Views Right CLINICAL DATA:  Right knee pain acutely.  No injury.  EXAM: RIGHT KNEE - 1-2 VIEW  COMPARISON:  None.  FINDINGS: No evidence of fracture, dislocation, or joint effusion. No evidence of arthropathy or other focal bone abnormality. Soft tissues are unremarkable.  IMPRESSION: No acute findings.  Electronically Signed   By: Elberta Fortis M.D.   On: 06/16/2019 15:50 MR CERVICAL SPINE WO CONTRAST CLINICAL  DATA:  Neck pain and radiculitis. Right worse than left arm pain 5 years.  EXAM: MRI CERVICAL SPINE WITHOUT CONTRAST  TECHNIQUE: Multiplanar, multisequence MR imaging of the cervical spine was performed. No intravenous contrast was administered.  COMPARISON:  MRI cervical spine 10/31/2015  FINDINGS: Alignment: Normal  Vertebrae: Normal bone marrow.  Negative for fracture or mass  Cord: Bilateral cord hyperintensity at C4-5 with cord flattening and spinal stenosis. The appearance is similar to the prior study. Remaining cord signal normal.  Posterior Fossa, vertebral arteries, paraspinal tissues: Negative  Disc levels:  C2-3: Mild disc degeneration.  Negative for stenosis  C3-4: Disc degeneration with diffuse uncinate spurring. Moderate spinal stenosis unchanged. Severe foraminal encroachment bilaterally unchanged.  C4-5: Disc degeneration with extensive uncinate spurring. Cord flattening with moderate to severe spinal stenosis unchanged. Bilateral cord hyperintensity. Moderate to severe foraminal encroachment bilaterally.  C5-6: Disc degeneration with diffuse uncinate spurring. Mild spinal stenosis. Moderate left foraminal encroachment and mild right foraminal encroachment due to spurring  C6-7: Disc degeneration and spondylosis. Mild spinal stenosis and mild foraminal stenosis bilaterally  C7-T1: Mild disc degeneration.  Negative for stenosis.  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 This is unchanged. Moderate to severe foraminal encroachment C4-5 unchanged.  Electronically Signed   By: Marlan Palau M.D.   On: 06/16/2019 14:15   Assessment  The primary encounter diagnosis was Cervical spinal stenosis (degenerative cord compression C4-5). Diagnoses of Cervical  foraminal stenosis (Bilateral) (C3-4, C4-5), Cervicalgia (Bilateral) (L>R), Chronic upper extremity radicular pain (Left), Chronic cervical radicular pain (Right), Chronic pain syndrome, and Neurogenic pain were also pertinent to this visit.  Plan of Care  Problem-specific:  No problem-specific Assessment & Plan notes found for this encounter.  I am having Walker Kehr. Ranes start on Oxycodone HCl and Oxycodone HCl. I am also having him maintain his potassium chloride SA, nitroGLYCERIN, aspirin, vitamin B-12, clopidogrel, hydrochlorothiazide, metoprolol succinate, pantoprazole, magnesium oxide, Vitamin D3, atorvastatin, isosorbide mononitrate, Oxycodone HCl, and gabapentin.  Pharmacotherapy (Medications Ordered): Meds ordered this encounter  Medications  . Oxycodone HCl 10 MG TABS    Sig: Take 1 tablet (10 mg total) by mouth 5 (five) times daily. Must last 30 days    Dispense:  150 tablet    Refill:  0    Chronic Pain: STOP Act (Not applicable) Fill 1 day early if closed on refill date. Do not fill until: 09/08/2019. To last until:  10/08/2019. Avoid benzodiazepines within 8 hours of opioids  . gabapentin (NEURONTIN) 300 MG capsule    Sig: Take 1 capsule (300 mg total) by mouth 3 (three) times daily AND 3 capsules (900 mg total) at bedtime.    Dispense:  180 capsule    Refill:  5    Fill one day early if pharmacy is closed on scheduled refill date. May substitute for generic if available.  . Oxycodone HCl 10 MG TABS    Sig: Take 1 tablet (10 mg total) by mouth 5 (five) times daily. Must last 30 days    Dispense:  150 tablet    Refill:  0    Chronic Pain: STOP Act (Not applicable) Fill 1 day early if closed on refill date. Do not fill until: 10/08/2019. To last until: 11/07/2019. Avoid benzodiazepines within 8 hours of opioids  . Oxycodone HCl 10 MG TABS    Sig: Take 1 tablet (10 mg total) by mouth 5 (five) times daily. Must last 30 days    Dispense:  150 tablet    Refill:  0    Chronic Pain: STOP  Act (Not applicable) Fill 1 day early if closed on refill date. Do not fill until: 11/07/2019. To last until: 12/07/2019. Avoid benzodiazepines within 8 hours of opioids   Orders:  Orders Placed This Encounter  Procedures  . Ambulatory referral to Neurosurgery    Referral Priority:   Routine    Referral Type:   Surgical    Referral Reason:   Specialty Services Required    Requested Specialty:   Neurosurgery    Number of Visits Requested:   1   Follow-up plan:   Return in about 3 months (around 12/06/2019) for (VV), (MM) for follow-up on neurosurgical referral.      Interventional therapies:  Considering:   NOTE: PLAVIX ANTICOAGULATION (Stop: 7-10 days  Restart: 2 hrs) Diagnostic right intra-articular knee joint injection #1 (w/ L.A. + steroid)  Therapeutic series of 5 intra-articular Hyalgan knee injections #1  Diagnostic right-sided genicular nerve block #1  Possible right-sided genicular nerve RFA #1    Palliative PRN treatment(s):   None at this time.     Recent Visits No visits were found meeting these conditions.  Showing recent visits within past 90 days and meeting all other requirements   Today's Visits Date Type Provider Dept  09/04/19 Telemedicine Milinda Pointer, MD [facility]  Showing today's visits and meeting all other requirements   Future Appointments No visits were found meeting these conditions.  Showing future appointments within next 90 days and meeting all other requirements   I discussed the assessment and treatment plan with the patient. The patient was provided an opportunity to ask questions and all were answered. The patient agreed with the plan and demonstrated an understanding of the instructions.  Patient advised to call back or seek an in-person evaluation if the symptoms or condition worsens.  Duration of encounter: 18 minutes.  Note by: Gaspar Cola, MD Date: 09/04/2019; Time: 9:41 AM

## 2019-09-04 ENCOUNTER — Other Ambulatory Visit: Payer: Self-pay

## 2019-09-04 ENCOUNTER — Ambulatory Visit: Payer: Medicare Other | Attending: Pain Medicine | Admitting: Pain Medicine

## 2019-09-04 DIAGNOSIS — M5412 Radiculopathy, cervical region: Secondary | ICD-10-CM | POA: Diagnosis not present

## 2019-09-04 DIAGNOSIS — M542 Cervicalgia: Secondary | ICD-10-CM | POA: Diagnosis not present

## 2019-09-04 DIAGNOSIS — M792 Neuralgia and neuritis, unspecified: Secondary | ICD-10-CM | POA: Diagnosis not present

## 2019-09-04 DIAGNOSIS — M4802 Spinal stenosis, cervical region: Secondary | ICD-10-CM | POA: Diagnosis not present

## 2019-09-04 DIAGNOSIS — G8929 Other chronic pain: Secondary | ICD-10-CM

## 2019-09-04 DIAGNOSIS — G894 Chronic pain syndrome: Secondary | ICD-10-CM

## 2019-09-04 MED ORDER — GABAPENTIN 300 MG PO CAPS: ORAL_CAPSULE | ORAL | 5 refills | Status: DC

## 2019-09-04 MED ORDER — OXYCODONE HCL 10 MG PO TABS: 10.0000 mg | ORAL_TABLET | Freq: Every day | ORAL | 0 refills | Status: DC

## 2019-12-04 ENCOUNTER — Telehealth: Payer: Medicare Other | Admitting: Pain Medicine

## 2019-12-07 ENCOUNTER — Telehealth: Payer: Self-pay | Admitting: *Deleted

## 2019-12-07 ENCOUNTER — Encounter: Payer: Self-pay | Admitting: Pain Medicine

## 2019-12-07 ENCOUNTER — Telehealth: Payer: Self-pay | Admitting: Pain Medicine

## 2019-12-07 NOTE — Telephone Encounter (Signed)
Left message at work number to please call us back to review medications prior to VV,

## 2019-12-07 NOTE — Telephone Encounter (Signed)
Patient lvmail stating he was returning call. He has appt on Monday, virtual, [place]

## 2019-12-07 NOTE — Telephone Encounter (Signed)
Attempted to call for pre appointment review of allergies/meds. Unable to leave message at both numbers.

## 2019-12-08 NOTE — Progress Notes (Signed)
Patient: [name] [name]  Service Category: E/M  Provider: Gaspar Cola, MD  DOB: 1951/07/13  DOS: 12/11/2019  Location: Office  MRN: [id number]  Setting: Ambulatory outpatient  Referring Provider: Raina Mina., MD  Type: Established Patient  Specialty: Interventional Pain Management  PCP: Raina Mina., MD  Location: Remote location  Delivery: TeleHealth     Virtual Encounter - Pain Management PROVIDER NOTE: Information contained herein reflects review and annotations entered in association with encounter. Interpretation of such information and data should be left to medically-trained personnel. Information provided to patient can be located elsewhere in the medical record under "Patient Instructions". Document created using STT-dictation technology, any transcriptional errors that may result from process are unintentional.    Contact & Pharmacy Preferred: [phone] Home: [phone] (home) Mobile: [phone] (mobile) E-mail: [email or web address]  [id number] [place], [place] - [address] [place] [address]Phone: [phone]: [phone]  Pre-screening  Mr. SMonetteoffered "in-person" vs "virtual" encounter. He indicated preferring virtual for this encounter.   Reason COVID-19*  Social distancing based on CDC and AMA recommendations.   I contacted WHowell Pringleon 12/11/2019 via telephone.      I clearly identified myself as FGaspar Cola MD. I verified that I was speaking with the correct person using two identifiers (Name: WJEYDEN COFFELT and date of birth: 1April 19, 1952.  Consent I sought verbal advanced consent from WHowell Pringlefor virtual visit interactions. I informed Mr. SMauof possible security and privacy concerns, risks, and limitations associated with providing "not-in-person" medical evaluation and management services. I also informed Mr. SPreeceof the availability of "in-person" appointments. Finally, I  informed him that there would be a charge for the virtual visit and that he could be  personally, fully or partially, financially responsible for it. Mr. SMcsweeneyexpressed understanding and agreed to proceed.   Historic Elements   Mr. WNOEMI ISHMAELis a 69y.o. year old, male patient evaluated today after his last contact with our practice on 12/07/2019. Mr. SWhitford has a past medical history of CAD (coronary artery disease), Chronic pain syndrome, GERD (gastroesophageal reflux disease), Hyperlipidemia, Hypertension, and Radiculitis of right cervical region (09/04/2015). He also  has a past surgical history that includes PCI 2009 with stenting of LIMA-LAD anastamosis; Coronary artery bypass graft; Hernia repair; Cholecystectomy; and left heart catheterization with coronary/graft angiogram (N/A, 04/21/2013). Mr. SNesthas a current medication list which includes the following prescription(s): aspirin, atorvastatin, vitamin d3, clopidogrel, gabapentin, hydrochlorothiazide, isosorbide mononitrate, magnesium oxide, metoprolol succinate, nitroglycerin, oxycodone hcl, [START ON 01/10/2020] oxycodone hcl, [START ON 02/09/2020] oxycodone hcl, pantoprazole, potassium chloride sa, and vitamin b-12. He  reports that he has quit smoking. He has never used smokeless tobacco. He reports that he does not drink alcohol or use drugs. Mr. SLiouhas No Known Allergies.   HPI  Today, he is being contacted for medication management. The patient indicates doing well with the current medication regimen. No adverse reactions or side effects reported to the medications.  The patient indicates that he had his evaluation for his cervical spine, but he was not quite sure if the physician thought that surgery was necessary.  However, [place], but he was not able to do so at that time due to the fact that his wife was pending to also have surgery done around the same  time  and he elected to wait so that he could help her.  At this point, he feels that he needs to be seen by that physician again so that they can put him on the schedule for the cervical surgery.  He describes that it is getting worse where he is constantly feeling a burning sensation and the shoulder that its not allowing him to sleep.  He is not having any problems with his medicines, but he did run out because of scheduling problems.  We have tried to call him before, but apparently the voicemail on his cellular phone is full and is not accepting messages.  Today when I initially called him to his cell phone I was unable to contact him and it was only when I called his secondary number that I was able to speak to him.  Today I told him that I would be in agreement with him seen another physician for a second opinion and if needed, surgery.  Pharmacotherapy Assessment  Analgesic: Oxycodone IR 10 mg, 1 tab PO 5X daily (50 mg/day of oxycodone) MME/day: 75 mg/day.   Monitoring: [place] PMP: PDMP reviewed during this encounter.       Pharmacotherapy: No side-effects or adverse reactions reported. Compliance: No problems identified. Effectiveness: Clinically acceptable. Plan: Refer to "POC".  UDS:  Summary  Date Value Ref Range Status  05/25/2018 FINAL  Final    Comment:    ==================================================================== TOXASSURE SELECT 13 (MW) ==================================================================== Test                             Result       Flag       Units Drug Present and Declared for Prescription Verification   Oxycodone                      241          EXPECTED   ng/mg creat   Oxymorphone                    858          EXPECTED   ng/mg creat   Noroxycodone                   1143         EXPECTED   ng/mg creat   Noroxymorphone                 582          EXPECTED   ng/mg creat    Sources of oxycodone are scheduled prescription medications.     Oxymorphone, noroxycodone, and noroxymorphone are expected    metabolites of oxycodone. Oxymorphone is also available as a    scheduled prescription medication. ==================================================================== Test                      Result    Flag   Units      Ref Range   Creatinine              113              mg/dL      >=20 ==================================================================== Declared Medications:  The flagging and interpretation on this report are based on the  following declared medications.  Unexpected results may arise from  inaccuracies in the declared medications.  **Note: The testing scope of this panel includes these  medications:  Oxycodone  **Note: The testing scope of this panel does not include following  reported medications:  Aspirin (Aspirin 81)  Atorvastatin  Cholecalciferol  Clopidogrel (Plavix)  Cyanocobalamin  Gabapentin (Neurontin)  Hydrochlorothiazide (Hydrodiuril)  Isosorbide (Imdur)  Magnesium (Mag-Ox)  Metoprolol (Toprol)  Nitroglycerin (Nitrostat)  Pantoprazole (Protonix)  Potassium (K-Dur) ==================================================================== For clinical consultation, please call [phone]. ====================================================================    Laboratory Chemistry Profile   Renal Lab Results  Component Value Date   BUN 11 07/04/2019   CREATININE 0.90 07/04/2019   BCR 12 07/04/2019   GFR 108.38 12/07/2013   GFRAA 101 07/04/2019   GFRNONAA 87 07/04/2019     Hepatic Lab Results  Component Value Date   AST 38 07/04/2019   ALT 26 07/04/2019   ALBUMIN 3.8 07/04/2019   ALKPHOS 93 07/04/2019     Electrolytes Lab Results  Component Value Date   NA 139 07/04/2019   K 3.7 07/04/2019   CL 100 07/04/2019   CALCIUM 9.2 07/04/2019   MG 1.9 03/12/2016     Bone Lab Results  Component Value Date   25OHVITD1 21 (L) 03/12/2016   25OHVITD2 2.2 03/12/2016    25OHVITD3 19 03/12/2016     Inflammation (CRP: Acute Phase) (ESR: Chronic Phase) Lab Results  Component Value Date   CRP 0.6 03/12/2016   ESRSEDRATE 21 (H) 03/12/2016       Note: Above Lab results reviewed.  Imaging  DG Knee 1-2 Views Right CLINICAL DATA:  Right knee pain acutely.  No injury.  EXAM: RIGHT KNEE - 1-2 VIEW  COMPARISON:  None.  FINDINGS: No evidence of fracture, dislocation, or joint effusion. No evidence of arthropathy or other focal bone abnormality. Soft tissues are unremarkable.  IMPRESSION: No acute findings.  Electronically Signed   By: Marin Olp M.D.   On: 06/16/2019 15:50 MR CERVICAL SPINE WO CONTRAST CLINICAL DATA:  Neck pain and radiculitis. Right worse than left arm pain 5 years.  EXAM: MRI CERVICAL SPINE WITHOUT CONTRAST  TECHNIQUE: Multiplanar, multisequence MR imaging of the cervical spine was performed. No intravenous contrast was administered.  COMPARISON:  MRI cervical spine 10/31/2015  FINDINGS: Alignment: Normal  Vertebrae: Normal bone marrow.  Negative for fracture or mass  Cord: Bilateral cord hyperintensity at C4-5 with cord flattening and spinal stenosis. The appearance is similar to the prior study. Remaining cord signal normal.  Posterior Fossa, vertebral arteries, paraspinal tissues: Negative  Disc levels:  C2-3: Mild disc degeneration.  Negative for stenosis  C3-4: Disc degeneration with diffuse uncinate spurring. Moderate spinal stenosis unchanged. Severe foraminal encroachment bilaterally unchanged.  C4-5: Disc degeneration with extensive uncinate spurring. Cord flattening with moderate to severe spinal stenosis unchanged. Bilateral cord hyperintensity. Moderate to severe foraminal encroachment bilaterally.  C5-6: Disc degeneration with diffuse uncinate spurring. Mild spinal stenosis. Moderate left foraminal encroachment and mild right foraminal encroachment due to spurring  C6-7: Disc degeneration  and spondylosis. Mild spinal stenosis and mild foraminal stenosis bilaterally  C7-T1: Mild disc degeneration.  Negative for stenosis.  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 This is unchanged. Moderate to severe foraminal encroachment C4-5 unchanged.  Electronically Signed   By: Franchot Gallo M.D.   On: 06/16/2019 14:15  Assessment  The primary encounter diagnosis was Chronic pain syndrome. Diagnoses of Cervical spinal stenosis (degenerative cord compression C4-5), Cervical foraminal stenosis (Bilateral) (  C3-4, C4-5), Cervicalgia (Bilateral) (L>R), Chronic upper extremity radicular pain (Left), Chronic cervical radicular pain (Right), and Pharmacologic therapy were also pertinent to this visit.  Plan of Care  Problem-specific:  No problem-specific Assessment & Plan notes found for this encounter.  Mr. GOLDIE DIMMER has a current medication list which includes the following long-term medication(s): atorvastatin, vitamin d3, gabapentin, hydrochlorothiazide, isosorbide mononitrate, magnesium oxide, metoprolol succinate, nitroglycerin, oxycodone hcl, [START ON 01/10/2020] oxycodone hcl, [START ON 02/09/2020] oxycodone hcl, and pantoprazole.  Pharmacotherapy (Medications Ordered): Meds ordered this encounter  Medications  . Oxycodone HCl 10 MG TABS    Sig: Take 1 tablet (10 mg total) by mouth 5 (five) times daily. Must last 30 days    Dispense:  150 tablet    Refill:  0    Chronic Pain: STOP Act (Not applicable) Fill 1 day early if closed on refill date. Do not fill until: 12/11/2019. To last until: 01/10/2020. Avoid benzodiazepines within 8 hours of opioids  . Oxycodone HCl 10 MG TABS    Sig: Take 1 tablet (10 mg total) by mouth 5 (five) times daily. Must last 30 days    Dispense:  150 tablet     Refill:  0    Chronic Pain: STOP Act (Not applicable) Fill 1 day early if closed on refill date. Do not fill until: 01/10/2020. To last until: 02/09/2020. Avoid benzodiazepines within 8 hours of opioids  . Oxycodone HCl 10 MG TABS    Sig: Take 1 tablet (10 mg total) by mouth 5 (five) times daily. Must last 30 days    Dispense:  150 tablet    Refill:  0    Chronic Pain: STOP Act (Not applicable) Fill 1 day early if closed on refill date. Do not fill until: 02/09/2020. To last until: 03/10/2020. Avoid benzodiazepines within 8 hours of opioids   Orders:  Orders Placed This Encounter  Procedures  . ToxASSURE Select 13 (MW), Urine    Volume: 30 ml(s). Minimum 3 ml of urine is needed. Document temperature of fresh sample. Indications: Long term (current) use of opiate analgesic (Z56.387)    Order Specific Question:   Release to patient    Answer:   Immediate   Follow-up plan:   Return in about 3 months (around 02/28/2020) for (F2F), (MM).      Interventional therapies:  Considering:   NOTE: PLAVIX ANTICOAGULATION (Stop: 7-10 days  Restart: 2 hrs) Diagnostic right intra-articular knee joint injection #1 (w/ L.A. + steroid)  Therapeutic series of 5 intra-articular Hyalgan knee injections #1  Diagnostic right-sided genicular nerve block #1  Possible right-sided genicular nerve RFA #1    Palliative PRN treatment(s):   None at this time.     Recent Visits No visits were found meeting these conditions.  Showing recent visits within past 90 days and meeting all other requirements   Future Appointments No visits were found meeting these conditions.  Showing future appointments within next 90 days and meeting all other requirements   I discussed the assessment and treatment plan with the patient. The patient was provided an opportunity to ask questions and all were answered. The patient agreed with the plan and demonstrated an understanding of the instructions.  Patient advised to call back or  seek an in-person evaluation if the symptoms or condition worsens.  Duration of encounter: 15 minutes.  Note by: Gaspar Cola, MD Date: 12/11/2019; Time: 2:08 PM

## 2019-12-11 ENCOUNTER — Other Ambulatory Visit: Payer: Self-pay

## 2019-12-11 ENCOUNTER — Ambulatory Visit: Payer: Medicare Other | Attending: Pain Medicine | Admitting: Pain Medicine

## 2019-12-11 DIAGNOSIS — Z79899 Other long term (current) drug therapy: Secondary | ICD-10-CM

## 2019-12-11 DIAGNOSIS — G8929 Other chronic pain: Secondary | ICD-10-CM

## 2019-12-11 DIAGNOSIS — M792 Neuralgia and neuritis, unspecified: Secondary | ICD-10-CM

## 2019-12-11 DIAGNOSIS — M542 Cervicalgia: Secondary | ICD-10-CM | POA: Diagnosis not present

## 2019-12-11 DIAGNOSIS — M5412 Radiculopathy, cervical region: Secondary | ICD-10-CM

## 2019-12-11 DIAGNOSIS — G894 Chronic pain syndrome: Secondary | ICD-10-CM

## 2019-12-11 DIAGNOSIS — M4802 Spinal stenosis, cervical region: Secondary | ICD-10-CM | POA: Diagnosis not present

## 2019-12-11 MED ORDER — OXYCODONE HCL 10 MG PO TABS: 10.0000 mg | ORAL_TABLET | Freq: Every day | ORAL | 0 refills | Status: DC

## 2019-12-22 ENCOUNTER — Telehealth: Payer: Self-pay | Admitting: Cardiovascular Disease

## 2019-12-22 NOTE — Telephone Encounter (Signed)
Patient wants to know if he needs to have fasting labs prior to his appt on 01/26/20 with Dr. Excell Seltzer.

## 2019-12-22 NOTE — Telephone Encounter (Signed)
Informed the patient he had labs drawn 6 months ago and they were "excellent" and are usually drawn q12 months.  Reiterated to him that unless he is having issues, he will likely have labs drawn prior to next visit, not the one on 6/25. He was grateful for assistance.

## 2020-01-10 ENCOUNTER — Telehealth: Payer: Self-pay | Admitting: Cardiovascular Disease

## 2020-01-10 MED ORDER — NITROGLYCERIN 0.4 MG SL SUBL: 0.4000 mg | SUBLINGUAL_TABLET | SUBLINGUAL | 3 refills | Status: DC | PRN

## 2020-01-10 MED ORDER — METOPROLOL SUCCINATE ER 50 MG PO TB24: ORAL_TABLET | ORAL | 1 refills | Status: DC

## 2020-01-10 NOTE — Telephone Encounter (Signed)
Pt's medications were sent to pt's pharmacy as requested. Confirmation received.  

## 2020-01-10 NOTE — Telephone Encounter (Signed)
°*  STAT* If patient is at the pharmacy, call can be transferred to refill team.   1. Which medications need to be refilled? (please list name of each medication and dose if known)  nitroGLYCERIN (NITROSTAT) 0.4 MG SL tablet metoprolol succinate (TOPROL-XL) 50 MG 24 hr tablet  2. Which pharmacy/location (including street and city if local pharmacy) is medication to be sent to? [id number] - [place], [place] - [address]  3. Do they need a 30 day or 90 day supply? 90 day supply   Pharmacy stated he can not get more metoprolol until August and Avik has been out of the medication for almost two weeks.

## 2020-01-26 ENCOUNTER — Other Ambulatory Visit: Payer: Self-pay

## 2020-01-26 ENCOUNTER — Ambulatory Visit: Payer: Medicare Other | Admitting: Cardiovascular Disease

## 2020-01-26 ENCOUNTER — Encounter: Payer: Self-pay | Admitting: Cardiovascular Disease

## 2020-01-26 VITALS — BP 104/66 | HR 73 | Wt 190.2 lb

## 2020-01-26 DIAGNOSIS — I10 Essential (primary) hypertension: Secondary | ICD-10-CM

## 2020-01-26 DIAGNOSIS — I2510 Atherosclerotic heart disease of native coronary artery without angina pectoris: Secondary | ICD-10-CM

## 2020-01-26 DIAGNOSIS — E782 Mixed hyperlipidemia: Secondary | ICD-10-CM

## 2020-01-26 DIAGNOSIS — R011 Cardiac murmur, unspecified: Secondary | ICD-10-CM | POA: Diagnosis not present

## 2020-01-26 NOTE — Progress Notes (Signed)
Cardiology Office Note:    Date:  01/26/2020   ID:  MERIK MIGNANO, DOB November 27, 1950, MRN [id number]  PCP:  Gordan Payment., MD  [facility] Cardiologist:  [name], MD  [facility] Electrophysiologist:  None   Referring MD: Gordan Payment., MD   Chief Complaint  Patient presents with  . Coronary Artery Disease    History of Present Illness:    [name] [name] is a 69 y.o. male with a hx of CAD S/P CABG with early SVG failureand ultimately required PCI of the LIMA-LAD anastomosis. He's been maintained on long term DAPT.He also has a history of hyperlipidemia, hypertension,GERD and chronic pain syndrome.  The patient is here alone today.  He is doing very well.  He has been able to keep his weight down through watching his diet.  He denies chest pain, chest pressure, or shortness of breath.  He denies leg swelling, orthopnea, heart palpitations, lightheadedness, or syncope.  He is compliant with his medications.  Reports good blood pressure control at home.  He continues to have problems with his neck.  He was under the impression he could not have neck surgery because he was required to stay on antiplatelet therapy without interruption.  We had a lengthy discussion about this today please see below.  Past Medical History:  Diagnosis Date  . CAD (coronary artery disease)    CABG  . Chronic pain syndrome   . GERD (gastroesophageal reflux disease)   . Hyperlipidemia   . Hypertension   . Radiculitis of right cervical region 09/04/2015    Past Surgical History:  Procedure Laterality Date  . CHOLECYSTECTOMY    . CORONARY ARTERY BYPASS GRAFT     multivessel  . HERNIA REPAIR    . LEFT HEART CATHETERIZATION WITH CORONARY/GRAFT ANGIOGRAM N/A 04/21/2013   Procedure: LEFT HEART CATHETERIZATION WITH [name];  Surgeon: Micheline Chapman, MD;  Location: [facility] CATH LAB;  Service: Cardiovascular;  Laterality: N/A;  . PCI 2009 with stenting of LIMA-LAD anastamosis       Current Medications: Current Meds  Medication Sig  . aspirin 81 MG tablet Take 1 tablet (81 mg total) by mouth daily.  Marland Kitchen atorvastatin (LIPITOR) 20 MG tablet Take 1 tablet (20 mg total) by mouth daily.  . Cholecalciferol (VITAMIN D3) 50 MCG (2000 UT) capsule Take 1 capsule (2,000 Units total) by mouth daily.  . clopidogrel (PLAVIX) 75 MG tablet TAKE 1 TABLET BY MOUTH EVERY DAY  . gabapentin (NEURONTIN) 300 MG capsule Take 1 capsule (300 mg total) by mouth 3 (three) times daily AND 3 capsules (900 mg total) at bedtime.  . hydrochlorothiazide (HYDRODIURIL) 25 MG tablet TAKE 1 TABLET BY MOUTH EVERY DAY  . isosorbide mononitrate (IMDUR) 30 MG 24 hr tablet Take 1 tablet (30 mg total) by mouth daily.  . magnesium oxide (MAG-OX) 400 MG tablet Take 1 tablet (400 mg total) by mouth 2 (two) times daily.  . metoprolol succinate (TOPROL-XL) 50 MG 24 hr tablet Take with or immediately following a meal.  . nitroGLYCERIN (NITROSTAT) 0.4 MG SL tablet Place 1 tablet (0.4 mg total) under the tongue every 5 (five) minutes as needed for chest pain.  . Oxycodone HCl 10 MG TABS Take 1 tablet (10 mg total) by mouth 5 (five) times daily. Must last 30 days  . [START ON 02/09/2020] Oxycodone HCl 10 MG TABS Take 1 tablet (10 mg total) by mouth 5 (five) times daily. Must last 30 days  . pantoprazole (PROTONIX)  40 MG tablet TAKE 1 TABLET BY MOUTH EVERY DAY  . potassium chloride SA (K-DUR,KLOR-CON) 20 MEQ tablet Take 20 mEq by mouth daily.  . vitamin B-12 (CYANOCOBALAMIN) 1000 MCG tablet Take 2,000 mcg by mouth daily.     Allergies:   Patient has no known allergies.   Social History   Socioeconomic History  . Marital status: Married    Spouse name: Not on file  . Number of children: Not on file  . Years of education: Not on file  . Highest education level: Not on file  Occupational History  . Not on file  Tobacco Use  . Smoking status: Former Research scientist (life sciences)  . Smokeless tobacco: Never Used  Substance and Sexual  Activity  . Alcohol use: No    Alcohol/week: 0.0 standard drinks  . Drug use: No  . Sexual activity: Not on file  Other Topics Concern  . Not on file  Social History Narrative  . Not on file   Social Determinants of Health   Financial Resource Strain:   . Difficulty of Paying Living Expenses:   Food Insecurity:   . Worried About Charity fundraiser in the Last Year:   . Arboriculturist in the Last Year:   Transportation Needs:   . Film/video editor (Medical):   Marland Kitchen Lack of Transportation (Non-Medical):   Physical Activity:   . Days of Exercise per Week:   . Minutes of Exercise per Session:   Stress:   . Feeling of Stress :   Social Connections:   . Frequency of Communication with Friends and Family:   . Frequency of Social Gatherings with Friends and Family:   . Attends Religious Services:   . Active Member of Clubs or Organizations:   . Attends Archivist Meetings:   Marland Kitchen Marital Status:      Family History: The patient's family history includes Multiple sclerosis in his mother; Stroke in his father.  ROS:   Please see the history of present illness.    All other systems reviewed and are negative.  EKGs/Labs/Other Studies Reviewed:    EKG:  EKG is not ordered today.   Recent Labs: 07/04/2019: ALT 26; BUN 11; Creatinine, Ser 0.90; Hemoglobin 12.9; Platelets 153; Potassium 3.7; Sodium 139  Recent Lipid Panel    Component Value Date/Time   CHOL 107 07/04/2019 1102   TRIG 71 07/04/2019 1102   HDL 48 07/04/2019 1102   CHOLHDL 2.2 07/04/2019 1102   CHOLHDL 3.8 06/18/2016 0954   VLDL 19 06/18/2016 0954   LDLCALC 44 07/04/2019 1102   LDLDIRECT 92.7 07/04/2009 0946    Physical Exam:    VS:  BP 104/66 Comment: pt BP in left arm 110/70  Pulse 73   Wt 190 lb 3.2 oz (86.3 kg)   SpO2 97%   BMI 28.50 kg/m     Wt Readings from Last 3 Encounters:  01/26/20 190 lb 3.2 oz (86.3 kg)  07/10/19 191 lb 1.9 oz (86.7 kg)  10/27/18 194 lb (88 kg)     GEN:   Well nourished, well developed in no acute distress HEENT: Normal NECK: No JVD; No carotid bruits LYMPHATICS: No lymphadenopathy CARDIAC: RRR, 2/6 systolic murmur at the apex RESPIRATORY:  Clear to auscultation without rales, wheezing or rhonchi  ABDOMEN: Soft, non-tender, non-distended MUSCULOSKELETAL:  No edema; No deformity  SKIN: Warm and dry NEUROLOGIC:  Alert and oriented x 3 PSYCHIATRIC:  Normal affect   ASSESSMENT:    1. Coronary  artery disease involving native coronary artery of native heart without angina pectoris   2. Essential hypertension   3. Mixed hyperlipidemia   4. Murmur, cardiac    PLAN:    In order of problems listed above:  1. Stable on current therapy.  Medications reviewed today.  He remains on long-term dual antiplatelet therapy with aspirin and clopidogrel.  He is completely asymptomatic with no anginal symptoms at present.  If he requires neck surgery down the road, I would be inclined to clear him for holding aspirin and clopidogrel for 7 days before surgery.  His stent in the LIMA to LAD was placed many years ago.  I would do a preoperative stress Myoview scan to risk stratify him with known vein graft occlusion.  Certainly his workload is greater than 4 metabolic equivalents and I do not think he would be at high risk of noncardiac surgery. 2. Blood pressure is very well controlled on current medicine.  I suspect his weight loss is helped keep his blood pressure in an excellent range. 3. Treated with a high intensity statin drug.  Most recent lipids reviewed from December 2020 demonstrating LDL cholesterol less than 70 mg/dL.  Will update labs at the time of his next office visit in 6 months. 4. I have recommended an echocardiogram.  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  The murmur is located in the apical region but has characteristics of an aortic valve murmur.  Again the patient is asymptomatic.   Medication  Adjustments/Labs and Tests Ordered: Current medicines are reviewed at length with the patient today.  Concerns regarding medicines are outlined above.  Orders Placed This Encounter  Procedures  . CBC with Differential/Platelet  . Comprehensive metabolic panel  . Lipid panel  . ECHOCARDIOGRAM COMPLETE   No orders of the defined types were placed in this encounter.   Patient Instructions  Medication Instructions:  Your provider recommends that you continue on your current medications as directed. Please refer to the Current Medication list given to you today.   *If you need a refill on your cardiac medications before your next appointment, please call your pharmacy*  Lab Work: Please return for fasting labs prior to your 6 month visit. If you have labs (blood work) drawn today and your tests are completely normal, you will receive your results only by: Marland Kitchen MyChart Message (if you have MyChart) OR . A paper copy in the mail If you have any lab test that is abnormal or we need to change your treatment, we will call you to review the results.  Testing/Procedures: Your provider has requested that you have an echocardiogram. Echocardiography is a painless test that uses sound waves to create images of your heart. It provides your doctor with information about the size and shape of your heart and how well your heart's chambers and valves are working. This procedure takes approximately one hour. There are no restrictions for this procedure.     Follow-Up: Your provider recommends that you schedule a follow-up appointment in 6 months with Dr. Excell Seltzer.    Signed, [name], MD  01/26/2020 12:46 PM    [facility]

## 2020-01-26 NOTE — Patient Instructions (Signed)
Medication Instructions:  Your provider recommends that you continue on your current medications as directed. Please refer to the Current Medication list given to you today.   *If you need a refill on your cardiac medications before your next appointment, please call your pharmacy*  Lab Work: Please return for fasting labs prior to your 6 month visit. If you have labs (blood work) drawn today and your tests are completely normal, you will receive your results only by: Marland Kitchen MyChart Message (if you have MyChart) OR . A paper copy in the mail If you have any lab test that is abnormal or we need to change your treatment, we will call you to review the results.  Testing/Procedures: Your provider has requested that you have an echocardiogram. Echocardiography is a painless test that uses sound waves to create images of your heart. It provides your doctor with information about the size and shape of your heart and how well your heart's chambers and valves are working. This procedure takes approximately one hour. There are no restrictions for this procedure.     Follow-Up: Your provider recommends that you schedule a follow-up appointment in 6 months with Dr. Excell Seltzer.

## 2020-01-29 ENCOUNTER — Other Ambulatory Visit: Payer: Self-pay

## 2020-01-29 ENCOUNTER — Ambulatory Visit (HOSPITAL_COMMUNITY): Payer: Medicare Other | Attending: Cardiovascular Disease

## 2020-01-29 DIAGNOSIS — I2510 Atherosclerotic heart disease of native coronary artery without angina pectoris: Secondary | ICD-10-CM | POA: Diagnosis not present

## 2020-01-29 DIAGNOSIS — I10 Essential (primary) hypertension: Secondary | ICD-10-CM | POA: Insufficient documentation

## 2020-01-29 DIAGNOSIS — E782 Mixed hyperlipidemia: Secondary | ICD-10-CM | POA: Diagnosis present

## 2020-01-30 ENCOUNTER — Telehealth: Payer: Self-pay | Admitting: Cardiovascular Disease

## 2020-01-30 DIAGNOSIS — R011 Cardiac murmur, unspecified: Secondary | ICD-10-CM

## 2020-01-30 NOTE — Telephone Encounter (Signed)
Patient calling for echo results 

## 2020-01-30 NOTE — Telephone Encounter (Signed)
The patient has been notified of the result and verbalized understanding.  All questions (if any) were answered. Echo order placed for one year.  Theresia Majors, RN 01/30/2020 1:11 PM
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
## 2020-02-26 ENCOUNTER — Other Ambulatory Visit: Payer: Self-pay

## 2020-02-26 ENCOUNTER — Encounter: Payer: Self-pay | Admitting: Pain Medicine

## 2020-02-26 ENCOUNTER — Telehealth: Payer: Self-pay | Admitting: *Deleted

## 2020-02-26 ENCOUNTER — Ambulatory Visit: Payer: Medicare Other | Attending: Pain Medicine | Admitting: Pain Medicine

## 2020-02-26 VITALS — BP 130/80 | HR 64 | Temp 97.5°F | Resp 16 | Ht 68.0 in | Wt 185.0 lb

## 2020-02-26 DIAGNOSIS — M792 Neuralgia and neuritis, unspecified: Secondary | ICD-10-CM | POA: Diagnosis not present

## 2020-02-26 DIAGNOSIS — G894 Chronic pain syndrome: Secondary | ICD-10-CM | POA: Insufficient documentation

## 2020-02-26 DIAGNOSIS — Z79899 Other long term (current) drug therapy: Secondary | ICD-10-CM | POA: Diagnosis present

## 2020-02-26 DIAGNOSIS — M79605 Pain in left leg: Secondary | ICD-10-CM | POA: Insufficient documentation

## 2020-02-26 DIAGNOSIS — G8929 Other chronic pain: Secondary | ICD-10-CM | POA: Diagnosis present

## 2020-02-26 MED ORDER — GABAPENTIN 300 MG PO CAPS: ORAL_CAPSULE | ORAL | 1 refills | Status: DC

## 2020-02-26 MED ORDER — OXYCODONE HCL 10 MG PO TABS: 10.0000 mg | ORAL_TABLET | Freq: Every day | ORAL | 0 refills | Status: DC

## 2020-02-26 NOTE — Progress Notes (Signed)
Nursing Pain Medication Assessment:  Safety precautions to be maintained throughout the outpatient stay will include: orient to surroundings, keep bed in low position, maintain call bell within reach at all times, provide assistance with transfer out of bed and ambulation.  Medication Inspection Compliance: Pill count conducted under aseptic conditions, in front of the patient. Neither the pills nor the bottle was removed from the patient's sight at any time. Once count was completed pills were immediately returned to the patient in their original bottle.  Medication: Oxycodone IR Pill/Patch Count: 74 of 150 pills remain Pill/Patch Appearance: Markings consistent with prescribed medication Bottle Appearance: Standard pharmacy container. Clearly labeled. Filled Date:02-09-2020  Last Medication intake:  Today

## 2020-02-26 NOTE — Patient Instructions (Signed)
____________________________________________________________________________________________  Drug Holidays (Slow)  What is a "Drug Holiday"? Drug Holiday: is the name given to the period of time during which a patient stops taking a medication(s) for the purpose of eliminating tolerance to the drug.  Benefits . Improved effectiveness of opioids. . Decreased opioid dose needed to achieve benefits. . Improved pain with lesser dose.  What is tolerance? Tolerance: is the progressive decreased in effectiveness of a drug due to its repetitive use. With repetitive use, the body gets use to the medication and as a consequence, it loses its effectiveness. This is a common problem seen with opioid pain medications. As a result, a larger dose of the drug is needed to achieve the same effect that used to be obtained with a smaller dose.  How long should a "Drug Holiday" last? You should stay off of the pain medicine for at least 14 consecutive days. (2 weeks)  Should I stop the medicine "cold turkey"? No. You should always coordinate with your Pain Specialist so that he/she can provide you with the correct medication dose to make the transition as smoothly as possible.  How do I stop the medicine? Slowly. You will be instructed to decrease the daily amount of pills that you take by one (1) pill every seven (7) days. This is called a "slow downward taper" of your dose. For example: if you normally take four (4) pills per day, you will be asked to drop this dose to three (3) pills per day for seven (7) days, then to two (2) pills per day for seven (7) days, then to one (1) per day for seven (7) days, and at the end of those last seven (7) days, this is when the "Drug Holiday" would start.   Will I have withdrawals? By doing a "slow downward taper" like this one, it is unlikely that you will experience any significant withdrawal symptoms. Typically, what triggers withdrawals is the sudden stop of a high  dose opioid therapy. Withdrawals can usually be avoided by slowly decreasing the dose over a prolonged period of time. If you do not follow these instructions and decide to stop your medication abruptly, withdrawals may be possible.  What are withdrawals? Withdrawals: refers to the wide range of symptoms that occur after stopping or dramatically reducing opiate drugs after heavy and prolonged use. Withdrawal symptoms do not occur to patients that use low dose opioids, or those who take the medication sporadically. Contrary to benzodiazepine (example: Valium, Xanax, etc.) or alcohol withdrawals ("Delirium Tremens"), opioid withdrawals are not lethal. Withdrawals are the physical manifestation of the body getting rid of the excess receptors.  Expected Symptoms Early symptoms of withdrawal may include: . Agitation . Anxiety . Muscle aches . Increased tearing . Insomnia . Runny nose . Sweating . Yawning  Late symptoms of withdrawal may include: . Abdominal cramping . Diarrhea . Dilated pupils . Goose bumps . Nausea . Vomiting  Will I experience withdrawals? Due to the slow nature of the taper, it is very unlikely that you will experience any.  What is a slow taper? Taper: refers to the gradual decrease in dose.  (Last update: 02/21/2020) ____________________________________________________________________________________________    ____________________________________________________________________________________________  Medication Rules  Purpose: To inform patients, and their family members, of our rules and regulations.  Applies to: All patients receiving prescriptions (written or electronic).  Pharmacy of record: Pharmacy where electronic prescriptions will be sent. If written prescriptions are taken to a different pharmacy, please inform the nursing staff. The pharmacy   listed in the electronic medical record should be the one where you would like electronic prescriptions  to be sent.  Electronic prescriptions: In compliance with the [place] Strengthen Opioid Misuse Prevention (STOP) Act of 2017 (Session Law 2017-74/H243), effective August 03, 2018, all controlled substances must be electronically prescribed. Calling prescriptions to the pharmacy will cease to exist.  Prescription refills: Only during scheduled appointments. Applies to all prescriptions.  NOTE: The following applies primarily to controlled substances (Opioid* Pain Medications).   Type of encounter (visit): For patients receiving controlled substances, face-to-face visits are required. (Not an option or up to the patient.)  Patient's responsibilities: 1. Pain Pills: Bring all pain pills to every appointment (except for procedure appointments). 2. Pill Bottles: Bring pills in original pharmacy bottle. Always bring the newest bottle. Bring bottle, even if empty. 3. Medication refills: You are responsible for knowing and keeping track of what medications you take and those you need refilled. The day before your appointment: write a list of all prescriptions that need to be refilled. The day of the appointment: give the list to the admitting nurse. Prescriptions will be written only during appointments. No prescriptions will be written on procedure days. If you forget a medication: it will not be "Called in", "Faxed", or "electronically sent". You will need to get another appointment to get these prescribed. No early refills. Do not call asking to have your prescription filled early. 4. Prescription Accuracy: You are responsible for carefully inspecting your prescriptions before leaving our office. Have the discharge nurse carefully go over each prescription with you, before taking them home. Make sure that your name is accurately spelled, that your address is correct. Check the name and dose of your medication to make sure it is accurate. Check the number of pills, and the written instructions to  make sure they are clear and accurate. Make sure that you are given enough medication to last until your next medication refill appointment. 5. Taking Medication: Take medication as prescribed. When it comes to controlled substances, taking less pills or less frequently than prescribed is permitted and encouraged. Never take more pills than instructed. Never take medication more frequently than prescribed.  6. Inform other Doctors: Always inform, all of your healthcare providers, of all the medications you take. 7. Pain Medication from other Providers: You are not allowed to accept any additional pain medication from any other Doctor or Healthcare provider. There are two exceptions to this rule. (see below) In the event that you require additional pain medication, you are responsible for notifying us, as stated below. 8. Medication Agreement: You are responsible for carefully reading and following our Medication Agreement. This must be signed before receiving any prescriptions from our practice. Safely store a copy of your signed Agreement. Violations to the Agreement will result in no further prescriptions. (Additional copies of our Medication Agreement are available upon request.) 9. Laws, Rules, & Regulations: All patients are expected to follow all Federal and State Laws, Statutes, Rules, & Regulations. Ignorance of the Laws does not constitute a valid excuse.  10. Illegal drugs and Controlled Substances: The use of illegal substances (including, but not limited to marijuana and its derivatives) and/or the illegal use of any controlled substances is strictly prohibited. Violation of this rule may result in the immediate and permanent discontinuation of any and all prescriptions being written by our practice. The use of any illegal substances is prohibited. 11. Adopted CDC guidelines & recommendations: Target dosing levels will be at or   below 60 MME/day. Use of benzodiazepines** is not  recommended.  Exceptions: There are only two exceptions to the rule of not receiving pain medications from other Healthcare Providers. 1. Exception #1 (Emergencies): In the event of an emergency (i.e.: accident requiring emergency care), you are allowed to receive additional pain medication. However, you are responsible for: As soon as you are able, call our office [phone], at any time of the day or night, and leave a message stating your name, the date and nature of the emergency, and the name and dose of the medication prescribed. In the event that your call is answered by a member of our staff, make sure to document and save the date, time, and the name of the person that took your information.  2. Exception #2 (Planned Surgery): In the event that you are scheduled by another doctor or dentist to have any type of surgery or procedure, you are allowed (for a period no longer than 30 days), to receive additional pain medication, for the acute post-op pain. However, in this case, you are responsible for picking up a copy of our "Post-op Pain Management for Surgeons" handout, and giving it to your surgeon or dentist. This document is available at our office, and does not require an appointment to obtain it. Simply go to our office during business hours (Monday-Thursday from 8:00 AM to 4:00 PM) (Friday 8:00 AM to 12:00 Noon) or if you have a scheduled appointment with us, prior to your surgery, and ask for it by name. In addition, you will need to provide us with your name, name of your surgeon, type of surgery, and date of procedure or surgery.  *Opioid medications include: morphine, codeine, oxycodone, oxymorphone, hydrocodone, hydromorphone, meperidine, tramadol, tapentadol, buprenorphine, fentanyl, methadone. **Benzodiazepine medications include: diazepam (Valium), alprazolam (Xanax), clonazepam (Klonopine), lorazepam (Ativan), clorazepate (Tranxene), chlordiazepoxide (Librium), estazolam (Prosom),  oxazepam (Serax), temazepam (Restoril), triazolam (Halcion) (Last updated: 09/30/2017) ____________________________________________________________________________________________   ____________________________________________________________________________________________  Medication Recommendations and Reminders  Applies to: All patients receiving prescriptions (written and/or electronic).  Medication Rules & Regulations: These rules and regulations exist for your safety and that of others. They are not flexible and neither are we. Dismissing or ignoring them will be considered "non-compliance" with medication therapy, resulting in complete and irreversible termination of such therapy. (See document titled "Medication Rules" for more details.) In all conscience, because of safety reasons, we cannot continue providing a therapy where the patient does not follow instructions.  Pharmacy of record:   Definition: This is the pharmacy where your electronic prescriptions will be sent.   We do not endorse any particular pharmacy, however, we have experienced problems with [facility] not securing enough medication supply for the community.  We do not restrict you in your choice of pharmacy. However, once we write for your prescriptions, we will NOT be re-sending more prescriptions to fix restricted supply problems created by your pharmacy, or your insurance.   The pharmacy listed in the electronic medical record should be the one where you want electronic prescriptions to be sent.  If you choose to change pharmacy, simply notify our nursing staff.  Recommendations:  Keep all of your pain medications in a safe place, under lock and key, even if you live alone. We will NOT replace lost, stolen, or damaged medication.  After you fill your prescription, take 1 week's worth of pills and put them away in a safe place. You should keep a separate, properly labeled bottle for this purpose. The remainder    should be kept in the original bottle. Use this as your primary supply, until it runs out. Once it's gone, then you know that you have 1 week's worth of medicine, and it is time to come in for a prescription refill. If you do this correctly, it is unlikely that you will ever run out of medicine.  To make sure that the above recommendation works, it is very important that you make sure your medication refill appointments are scheduled at least 1 week before you run out of medicine. To do this in an effective manner, make sure that you do not leave the office without scheduling your next medication management appointment. Always ask the nursing staff to show you in your prescription , when your medication will be running out. Then arrange for the receptionist to get you a return appointment, at least 7 days before you run out of medicine. Do not wait until you have 1 or 2 pills left, to come in. This is very poor planning and does not take into consideration that we may need to cancel appointments due to bad weather, sickness, or emergencies affecting our staff.  DO NOT ACCEPT A "Partial Fill": If for any reason your pharmacy does not have enough pills/tablets to completely fill or refill your prescription, do not allow for a "partial fill". The law allows the pharmacy to complete that prescription within 72 hours, without requiring a new prescription. If they do not fill the rest of your prescription within those 72 hours, you will need a separate prescription to fill the remaining amount, which we will NOT provide. If the reason for the partial fill is your insurance, you will need to talk to the pharmacist about payment alternatives for the remaining tablets, but again, DO NOT ACCEPT A PARTIAL FILL, unless you can trust your pharmacist to obtain the remainder of the pills within 72 hours.  Prescription refills and/or changes in medication(s):   Prescription refills, and/or changes in dose or medication,  will be conducted only during scheduled medication management appointments. (Applies to both, written and electronic prescriptions.)  No refills on procedure days. No medication will be changed or started on procedure days. No changes, adjustments, and/or refills will be conducted on a procedure day. Doing so will interfere with the diagnostic portion of the procedure.  No phone refills. No medications will be "called into the pharmacy".  No Fax refills.  No weekend refills.  No Holliday refills.  No after hours refills.  Remember:  Business hours are:  Monday to Thursday 8:00 AM to 4:00 PM Provider's Schedule: Jasmene Goswami, MD - Appointments are:  Medication management: Monday and Wednesday 8:00 AM to 4:00 PM Procedure day: Tuesday and Thursday 7:30 AM to 4:00 PM Bilal Lateef, MD - Appointments are:  Medication management: Tuesday and Thursday 8:00 AM to 4:00 PM Procedure day: Monday and Wednesday 7:30 AM to 4:00 PM (Last update: 02/21/2020) ____________________________________________________________________________________________   ____________________________________________________________________________________________  CANNABIDIOL (AKA: CBD Oil or Pills)  Applies to: All patients receiving prescriptions of controlled substances (written and/or electronic).  General Information: Cannabidiol (CBD), a derivative of Marijuana, was discovered in 1940. It is one of some 113 identified cannabinoids in cannabis (Marijuana) plants, accounting for up to 40% of the plant's extract. As of 2018, preliminary clinical research on cannabidiol included studies of anxiety, cognition, movement disorders, and pain.  Cannabidiol is consummed in multiple ways, including inhalation of cannabis smoke or vapor, as an aerosol spray into the cheek, and by mouth. It   may be supplied as CBD oil containing CBD as the active ingredient (no added tetrahydrocannabinol (THC) or terpenes), a full-plant  CBD-dominant hemp extract oil, capsules, dried cannabis, or as a liquid solution. CBD is thought not have the same psychoactivity as THC, and may affect the actions of THC. Studies suggest that CBD may interact with different biological targets, including cannabinoid receptors and other neurotransmitter receptors. As of 2018 the mechanism of action for its biological effects has not been determined.  In the [place], cannabidiol has a limited approval by the Food and Drug Administration (FDA) for treatment of only two types of epilepsy disorders. The side effects of long-term use of the drug include somnolence, decreased appetite, diarrhea, fatigue, malaise, weakness, sleeping problems, and others.  CBD remains a Schedule I drug prohibited for any use.  Legality: Some manufacturers ship CBD products nationally, an illegal action which the FDA has not enforced in 2018, with CBD remaining the subject of an FDA investigational new drug evaluation, and is not considered legal as a dietary supplement or food ingredient as of December 2018. Federal illegality has made it difficult historically to conduct research on CBD. CBD is openly sold in head shops and health food stores in some states where such sales have not been explicitly legalized.  Warning: Because it is not FDA approved for general use or treatment of pain, it is not required to undergo the same manufacturing controls as prescription drugs.  This means that the available cannabidiol (CBD) may be contaminated with THC.  If this is the case, it will trigger a positive urine drug screen (UDS) test for cannabinoids (Marijuana).  Because a positive UDS for illicit substances is a violation of our medication agreement, your opioid analgesics (pain medicine) may be permanently discontinued. (Last update: 02/21/2020) ____________________________________________________________________________________________    

## 2020-03-01 LAB — TOXASSURE SELECT 13 (MW), URINE

## 2020-03-11 ENCOUNTER — Telehealth: Payer: Self-pay | Admitting: Pain Medicine

## 2020-03-11 NOTE — Telephone Encounter (Signed)
Please advise 

## 2020-03-11 NOTE — Telephone Encounter (Signed)
Patient lvmail saying his pharmacy does not have and doesn't know when they will have Oxycodone 10mg . Can a script for 5mg s be sent to the pharmacy please. He has 2 days of meds left

## 2020-03-12 ENCOUNTER — Other Ambulatory Visit: Payer: Self-pay | Admitting: Pain Medicine

## 2020-03-12 DIAGNOSIS — G894 Chronic pain syndrome: Secondary | ICD-10-CM

## 2020-03-12 MED ORDER — OXYCODONE HCL 10 MG PO TABS: 10.0000 mg | ORAL_TABLET | Freq: Every day | ORAL | 0 refills | Status: DC

## 2020-03-12 NOTE — Telephone Encounter (Signed)
I have asked Dr. Laban Emperor to resend the script to [facility].

## 2020-03-12 NOTE — Progress Notes (Unsigned)
Today you I had to resend his last prescription to another pharmacy because once again [facility] did not have his.

## 2020-03-12 NOTE — Telephone Encounter (Signed)
Pt is calling again asking about his pain medication and when Dr Dorris Carnes is going to fix it.

## 2020-03-13 NOTE — Telephone Encounter (Signed)
Patient notified that script for Oxycodone was sent to [facility] yesterday. Script at [facility] cancelled.

## 2020-03-15 ENCOUNTER — Other Ambulatory Visit: Payer: Self-pay | Admitting: Pain Medicine

## 2020-03-15 ENCOUNTER — Telehealth: Payer: Self-pay | Admitting: Pain Medicine

## 2020-03-15 DIAGNOSIS — G894 Chronic pain syndrome: Secondary | ICD-10-CM

## 2020-03-15 MED ORDER — MORPHINE SULFATE ER 30 MG PO TBCR: 30.0000 mg | EXTENDED_RELEASE_TABLET | Freq: Two times a day (BID) | ORAL | 0 refills | Status: DC

## 2020-03-15 NOTE — Telephone Encounter (Addendum)
Patient lvmail stating his pharmacy does not have his pain meds script . Please advise patient what to do.

## 2020-03-15 NOTE — Telephone Encounter (Signed)
Oxycodone 10 mg cancelled at [facility].  Has been replaced with MS Contin 30 mg every 12 hours. Patient aware.

## 2020-03-15 NOTE — Telephone Encounter (Signed)
[name] [name] called patient to let him know that Rx has been sent for MS Contin to replace oxycodone

## 2020-03-15 NOTE — Telephone Encounter (Signed)
Voicemail left with patient to verify his need.  Also called [facility] and they do have Rx, they do not have the quantity. Bubble message sent to Dr Laban Emperor.

## 2020-03-18 ENCOUNTER — Telehealth: Payer: Self-pay | Admitting: Cardiovascular Disease

## 2020-03-18 ENCOUNTER — Other Ambulatory Visit: Payer: Self-pay | Admitting: Pain Medicine

## 2020-03-18 NOTE — Telephone Encounter (Signed)
Pt c/o medication issue:  1. Name of Medication: morphine (MS CONTIN) 30 MG 12 hr tablet  2. How are you currently taking this medication (dosage and times per day)? Prescribed for every 12 hours, but will take as needed  3. Are you having a reaction (difficulty breathing--STAT)? No   4. What is your medication issue? [name] [name] is calling stating he was prescribed this medication due to the pharmacy not currently having percocet's like he is normally prescribed. He is wanting to clarify with Dr. Excell Seltzer that it is okay for him to take this before he starts the medication. Please advise.

## 2020-03-18 NOTE — Telephone Encounter (Signed)
There should not be any interactions between morphine and his current medications

## 2020-03-18 NOTE — Telephone Encounter (Signed)
Informed patient there are no interactions between morphine and his cardiac meds. Reiterated to him to be cautious if he has never taken morphine as he may feel slightly disoriented. Advised him not to drive as well. He was grateful for assistance and info.

## 2020-03-25 ENCOUNTER — Ambulatory Visit: Payer: Medicare Other | Admitting: Pain Medicine

## 2020-04-07 ENCOUNTER — Other Ambulatory Visit: Payer: Self-pay | Admitting: Cardiovascular Disease
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
## 2020-04-09 ENCOUNTER — Ambulatory Visit: Payer: Medicare Other | Attending: Pain Medicine | Admitting: Pain Medicine

## 2020-04-09 ENCOUNTER — Other Ambulatory Visit: Payer: Self-pay

## 2020-04-09 ENCOUNTER — Encounter: Payer: Self-pay | Admitting: Pain Medicine

## 2020-04-09 VITALS — BP 137/88 | HR 72 | Temp 97.4°F | Resp 18 | Ht 68.0 in | Wt 185.0 lb

## 2020-04-09 DIAGNOSIS — M5412 Radiculopathy, cervical region: Secondary | ICD-10-CM | POA: Diagnosis present

## 2020-04-09 DIAGNOSIS — G8929 Other chronic pain: Secondary | ICD-10-CM

## 2020-04-09 DIAGNOSIS — M79605 Pain in left leg: Secondary | ICD-10-CM | POA: Insufficient documentation

## 2020-04-09 DIAGNOSIS — M4802 Spinal stenosis, cervical region: Secondary | ICD-10-CM | POA: Diagnosis present

## 2020-04-09 DIAGNOSIS — M542 Cervicalgia: Secondary | ICD-10-CM

## 2020-04-09 DIAGNOSIS — G894 Chronic pain syndrome: Secondary | ICD-10-CM | POA: Diagnosis not present

## 2020-04-09 DIAGNOSIS — F1120 Opioid dependence, uncomplicated: Secondary | ICD-10-CM | POA: Diagnosis present

## 2020-04-09 DIAGNOSIS — Z79899 Other long term (current) drug therapy: Secondary | ICD-10-CM

## 2020-04-09 MED ORDER — OXYCODONE HCL 5 MG PO TABS: 10.0000 mg | ORAL_TABLET | Freq: Every day | ORAL | 0 refills | Status: DC

## 2020-04-09 NOTE — Patient Instructions (Signed)
____________________________________________________________________________________________  Drug Holidays (Slow)  What is a "Drug Holiday"? Drug Holiday: is the name given to the period of time during which a patient stops taking a medication(s) for the purpose of eliminating tolerance to the drug.  Benefits . Improved effectiveness of opioids. . Decreased opioid dose needed to achieve benefits. . Improved pain with lesser dose.  What is tolerance? Tolerance: is the progressive decreased in effectiveness of a drug due to its repetitive use. With repetitive use, the body gets use to the medication and as a consequence, it loses its effectiveness. This is a common problem seen with opioid pain medications. As a result, a larger dose of the drug is needed to achieve the same effect that used to be obtained with a smaller dose.  How long should a "Drug Holiday" last? You should stay off of the pain medicine for at least 14 consecutive days. (2 weeks)  Should I stop the medicine "cold turkey"? No. You should always coordinate with your Pain Specialist so that he/she can provide you with the correct medication dose to make the transition as smoothly as possible.  How do I stop the medicine? Slowly. You will be instructed to decrease the daily amount of pills that you take by one (1) pill every seven (7) days. This is called a "slow downward taper" of your dose. For example: if you normally take four (4) pills per day, you will be asked to drop this dose to three (3) pills per day for seven (7) days, then to two (2) pills per day for seven (7) days, then to one (1) per day for seven (7) days, and at the end of those last seven (7) days, this is when the "Drug Holiday" would start.   Will I have withdrawals? By doing a "slow downward taper" like this one, it is unlikely that you will experience any significant withdrawal symptoms. Typically, what triggers withdrawals is the sudden stop of a high  dose opioid therapy. Withdrawals can usually be avoided by slowly decreasing the dose over a prolonged period of time. If you do not follow these instructions and decide to stop your medication abruptly, withdrawals may be possible.  What are withdrawals? Withdrawals: refers to the wide range of symptoms that occur after stopping or dramatically reducing opiate drugs after heavy and prolonged use. Withdrawal symptoms do not occur to patients that use low dose opioids, or those who take the medication sporadically. Contrary to benzodiazepine (example: Valium, Xanax, etc.) or alcohol withdrawals ("Delirium Tremens"), opioid withdrawals are not lethal. Withdrawals are the physical manifestation of the body getting rid of the excess receptors.  Expected Symptoms Early symptoms of withdrawal may include: . Agitation . Anxiety . Muscle aches . Increased tearing . Insomnia . Runny nose . Sweating . Yawning  Late symptoms of withdrawal may include: . Abdominal cramping . Diarrhea . Dilated pupils . Goose bumps . Nausea . Vomiting  Will I experience withdrawals? Due to the slow nature of the taper, it is very unlikely that you will experience any.  What is a slow taper? Taper: refers to the gradual decrease in dose.  (Last update: 02/21/2020) ____________________________________________________________________________________________    ____________________________________________________________________________________________  Medication Rules  Purpose: To inform patients, and their family members, of our rules and regulations.  Applies to: All patients receiving prescriptions (written or electronic).  Pharmacy of record: Pharmacy where electronic prescriptions will be sent. If written prescriptions are taken to a different pharmacy, please inform the nursing staff. The pharmacy   listed in the electronic medical record should be the one where you would like electronic prescriptions  to be sent.  Electronic prescriptions: In compliance with the [place] Strengthen Opioid Misuse Prevention (STOP) Act of 2017 (Session Law 2017-74/H243), effective August 03, 2018, all controlled substances must be electronically prescribed. Calling prescriptions to the pharmacy will cease to exist.  Prescription refills: Only during scheduled appointments. Applies to all prescriptions.  NOTE: The following applies primarily to controlled substances (Opioid* Pain Medications).   Type of encounter (visit): For patients receiving controlled substances, face-to-face visits are required. (Not an option or up to the patient.)  Patient's responsibilities: 1. Pain Pills: Bring all pain pills to every appointment (except for procedure appointments). 2. Pill Bottles: Bring pills in original pharmacy bottle. Always bring the newest bottle. Bring bottle, even if empty. 3. Medication refills: You are responsible for knowing and keeping track of what medications you take and those you need refilled. The day before your appointment: write a list of all prescriptions that need to be refilled. The day of the appointment: give the list to the admitting nurse. Prescriptions will be written only during appointments. No prescriptions will be written on procedure days. If you forget a medication: it will not be "Called in", "Faxed", or "electronically sent". You will need to get another appointment to get these prescribed. No early refills. Do not call asking to have your prescription filled early. 4. Prescription Accuracy: You are responsible for carefully inspecting your prescriptions before leaving our office. Have the discharge nurse carefully go over each prescription with you, before taking them home. Make sure that your name is accurately spelled, that your address is correct. Check the name and dose of your medication to make sure it is accurate. Check the number of pills, and the written instructions to  make sure they are clear and accurate. Make sure that you are given enough medication to last until your next medication refill appointment. 5. Taking Medication: Take medication as prescribed. When it comes to controlled substances, taking less pills or less frequently than prescribed is permitted and encouraged. Never take more pills than instructed. Never take medication more frequently than prescribed.  6. Inform other Doctors: Always inform, all of your healthcare providers, of all the medications you take. 7. Pain Medication from other Providers: You are not allowed to accept any additional pain medication from any other Doctor or Healthcare provider. There are two exceptions to this rule. (see below) In the event that you require additional pain medication, you are responsible for notifying us, as stated below. 8. Medication Agreement: You are responsible for carefully reading and following our Medication Agreement. This must be signed before receiving any prescriptions from our practice. Safely store a copy of your signed Agreement. Violations to the Agreement will result in no further prescriptions. (Additional copies of our Medication Agreement are available upon request.) 9. Laws, Rules, & Regulations: All patients are expected to follow all Federal and State Laws, Statutes, Rules, & Regulations. Ignorance of the Laws does not constitute a valid excuse.  10. Illegal drugs and Controlled Substances: The use of illegal substances (including, but not limited to marijuana and its derivatives) and/or the illegal use of any controlled substances is strictly prohibited. Violation of this rule may result in the immediate and permanent discontinuation of any and all prescriptions being written by our practice. The use of any illegal substances is prohibited. 11. Adopted CDC guidelines & recommendations: Target dosing levels will be at or   below 60 MME/day. Use of benzodiazepines** is not  recommended.  Exceptions: There are only two exceptions to the rule of not receiving pain medications from other Healthcare Providers. 1. Exception #1 (Emergencies): In the event of an emergency (i.e.: accident requiring emergency care), you are allowed to receive additional pain medication. However, you are responsible for: As soon as you are able, call our office [phone], at any time of the day or night, and leave a message stating your name, the date and nature of the emergency, and the name and dose of the medication prescribed. In the event that your call is answered by a member of our staff, make sure to document and save the date, time, and the name of the person that took your information.  2. Exception #2 (Planned Surgery): In the event that you are scheduled by another doctor or dentist to have any type of surgery or procedure, you are allowed (for a period no longer than 30 days), to receive additional pain medication, for the acute post-op pain. However, in this case, you are responsible for picking up a copy of our "Post-op Pain Management for Surgeons" handout, and giving it to your surgeon or dentist. This document is available at our office, and does not require an appointment to obtain it. Simply go to our office during business hours (Monday-Thursday from 8:00 AM to 4:00 PM) (Friday 8:00 AM to 12:00 Noon) or if you have a scheduled appointment with us, prior to your surgery, and ask for it by name. In addition, you are responsible for: calling our office [phone], at any time of the day or night, and leaving a message stating your name, name of your surgeon, type of surgery, and date of procedure or surgery. Failure to comply with your responsibilities may result in termination of therapy involving the controlled substances.  *Opioid medications include: morphine, codeine, oxycodone, oxymorphone, hydrocodone, hydromorphone, meperidine, tramadol, tapentadol, buprenorphine,  fentanyl, methadone. **Benzodiazepine medications include: diazepam (Valium), alprazolam (Xanax), clonazepam (Klonopine), lorazepam (Ativan), clorazepate (Tranxene), chlordiazepoxide (Librium), estazolam (Prosom), oxazepam (Serax), temazepam (Restoril), triazolam (Halcion) (Last updated: 04/09/2020) ____________________________________________________________________________________________   ____________________________________________________________________________________________  Medication Recommendations and Reminders  Applies to: All patients receiving prescriptions (written and/or electronic).  Medication Rules & Regulations: These rules and regulations exist for your safety and that of others. They are not flexible and neither are we. Dismissing or ignoring them will be considered "non-compliance" with medication therapy, resulting in complete and irreversible termination of such therapy. (See document titled "Medication Rules" for more details.) In all conscience, because of safety reasons, we cannot continue providing a therapy where the patient does not follow instructions.  Pharmacy of record:   Definition: This is the pharmacy where your electronic prescriptions will be sent.   We do not endorse any particular pharmacy, however, we have experienced problems with [facility] not securing enough medication supply for the community.  We do not restrict you in your choice of pharmacy. However, once we write for your prescriptions, we will NOT be re-sending more prescriptions to fix restricted supply problems created by your pharmacy, or your insurance.   The pharmacy listed in the electronic medical record should be the one where you want electronic prescriptions to be sent.  If you choose to change pharmacy, simply notify our nursing staff.  Recommendations:  Keep all of your pain medications in a safe place, under lock and key, even if you live alone. We will NOT replace lost,  stolen, or damaged medication.  After   you fill your prescription, take 1 week's worth of pills and put them away in a safe place. You should keep a separate, properly labeled bottle for this purpose. The remainder should be kept in the original bottle. Use this as your primary supply, until it runs out. Once it's gone, then you know that you have 1 week's worth of medicine, and it is time to come in for a prescription refill. If you do this correctly, it is unlikely that you will ever run out of medicine.  To make sure that the above recommendation works, it is very important that you make sure your medication refill appointments are scheduled at least 1 week before you run out of medicine. To do this in an effective manner, make sure that you do not leave the office without scheduling your next medication management appointment. Always ask the nursing staff to show you in your prescription , when your medication will be running out. Then arrange for the receptionist to get you a return appointment, at least 7 days before you run out of medicine. Do not wait until you have 1 or 2 pills left, to come in. This is very poor planning and does not take into consideration that we may need to cancel appointments due to bad weather, sickness, or emergencies affecting our staff.  DO NOT ACCEPT A "Partial Fill": If for any reason your pharmacy does not have enough pills/tablets to completely fill or refill your prescription, do not allow for a "partial fill". The law allows the pharmacy to complete that prescription within 72 hours, without requiring a new prescription. If they do not fill the rest of your prescription within those 72 hours, you will need a separate prescription to fill the remaining amount, which we will NOT provide. If the reason for the partial fill is your insurance, you will need to talk to the pharmacist about payment alternatives for the remaining tablets, but again, DO NOT ACCEPT A PARTIAL FILL,  unless you can trust your pharmacist to obtain the remainder of the pills within 72 hours.  Prescription refills and/or changes in medication(s):   Prescription refills, and/or changes in dose or medication, will be conducted only during scheduled medication management appointments. (Applies to both, written and electronic prescriptions.)  No refills on procedure days. No medication will be changed or started on procedure days. No changes, adjustments, and/or refills will be conducted on a procedure day. Doing so will interfere with the diagnostic portion of the procedure.  No phone refills. No medications will be "called into the pharmacy".  No Fax refills.  No weekend refills.  No Holliday refills.  No after hours refills.  Remember:  Business hours are:  Monday to Thursday 8:00 AM to 4:00 PM Provider's Schedule: Truth Wolaver, MD - Appointments are:  Medication management: Monday and Wednesday 8:00 AM to 4:00 PM Procedure day: Tuesday and Thursday 7:30 AM to 4:00 PM Bilal Lateef, MD - Appointments are:  Medication management: Tuesday and Thursday 8:00 AM to 4:00 PM Procedure day: Monday and Wednesday 7:30 AM to 4:00 PM (Last update: 02/21/2020) ____________________________________________________________________________________________   ____________________________________________________________________________________________  CBD (cannabidiol) WARNING  Applicable to: All individuals currently taking or considering taking CBD (cannabidiol) and, more important, all patients taking opioid analgesic controlled substances (pain medication). (Example: oxycodone; oxymorphone; hydrocodone; hydromorphone; morphine; methadone; tramadol; tapentadol; fentanyl; buprenorphine; butorphanol; dextromethorphan; meperidine; codeine; etc.)  Legal status: CBD remains a Schedule I drug prohibited for any use. CBD is illegal with one exception. In the   [place], CBD has a limited Food  and Drug Administration (FDA) approval for the treatment of two specific types of epilepsy disorders. Only one CBD product has been approved by the FDA for this purpose: "Epidiolex". FDA is aware that some companies are marketing products containing cannabis and cannabis-derived compounds in ways that violate the Federal Food, Drug and Cosmetic Act (FD&C Act) and that may put the health and safety of consumers at risk. The FDA, a Federal agency, has not enforced the CBD status since 2018.   Legality: Some manufacturers ship CBD products nationally, which is illegal. Often such products are sold online and are therefore available throughout the country. CBD is openly sold in head shops and health food stores in some states where such sales have not been explicitly legalized. Selling unapproved products with unsubstantiated therapeutic claims is not only a violation of the law, but also can put patients at risk, as these products have not been proven to be safe or effective. Federal illegality makes it difficult to conduct research on CBD.  Reference: "FDA Regulation of Cannabis and Cannabis-Derived Products, Including Cannabidiol (CBD)" - https://www.fda.gov/news-events/public-health-focus/fda-regulation-cannabis-and-cannabis-derived-products-including-cannabidiol-cbd  Warning: CBD is not FDA approved and has not undergo the same manufacturing controls as prescription drugs.  This means that the purity and safety of available CBD may be questionable. Most of the time, despite manufacturer's claims, it is contaminated with THC (delta-9-tetrahydrocannabinol - the chemical in marijuana responsible for the "HIGH").  When this is the case, the THC contaminant will trigger a positive urine drug screen (UDS) test for Marijuana (carboxy-THC). Because a positive UDS for any illicit substance is a violation of our medication agreement, your opioid analgesics (pain medicine) may be permanently discontinued.  MORE ABOUT  CBD  General Information: CBD  is a derivative of the Marijuana (cannabis sativa) plant discovered in 1940. It is one of the 113 identified substances found in Marijuana. It accounts for up to 40% of the plant's extract. As of 2018, preliminary clinical studies on CBD included research for the treatment of anxiety, movement disorders, and pain. CBD is available and consumed in multiple forms, including inhalation of smoke or vapor, as an aerosol spray, and by mouth. It may be supplied as an oil containing CBD, capsules, dried cannabis, or as a liquid solution. CBD is thought not to be as psychoactive as THC (delta-9-tetrahydrocannabinol - the chemical in marijuana responsible for the "HIGH"). Studies suggest that CBD may interact with different biological target receptors in the body, including cannabinoid and other neurotransmitter receptors. As of 2018 the mechanism of action for its biological effects has not been determined.  Side-effects  Adverse reactions: Dry mouth, diarrhea, decreased appetite, fatigue, drowsiness, malaise, weakness, sleep disturbances, and others.  Drug interactions: CBC may interact with other medications such as blood-thinners. (Last update: 03/09/2020) ____________________________________________________________________________________________    

## 2020-05-10 ENCOUNTER — Other Ambulatory Visit: Payer: Self-pay | Admitting: Cardiovascular Disease

## 2020-05-10 DIAGNOSIS — I25810 Atherosclerosis of coronary artery bypass graft(s) without angina pectoris: Secondary | ICD-10-CM

## 2020-05-10 DIAGNOSIS — I10 Essential (primary) hypertension: Secondary | ICD-10-CM

## 2020-05-10 DIAGNOSIS — E785 Hyperlipidemia, unspecified: Secondary | ICD-10-CM
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
## 2020-06-26 ENCOUNTER — Other Ambulatory Visit: Payer: Self-pay | Admitting: Pain Medicine

## 2020-06-26 ENCOUNTER — Encounter: Payer: Self-pay | Admitting: Pain Medicine

## 2020-06-26 ENCOUNTER — Ambulatory Visit: Payer: Medicare Other | Attending: Pain Medicine | Admitting: Pain Medicine

## 2020-06-26 ENCOUNTER — Other Ambulatory Visit: Payer: Self-pay

## 2020-06-26 VITALS — BP 144/90 | HR 72 | Temp 97.7°F | Resp 16 | Ht 68.0 in | Wt 185.0 lb

## 2020-06-26 DIAGNOSIS — M4802 Spinal stenosis, cervical region: Secondary | ICD-10-CM | POA: Diagnosis present

## 2020-06-26 DIAGNOSIS — K219 Gastro-esophageal reflux disease without esophagitis: Secondary | ICD-10-CM | POA: Diagnosis present

## 2020-06-26 DIAGNOSIS — F1120 Opioid dependence, uncomplicated: Secondary | ICD-10-CM | POA: Diagnosis present

## 2020-06-26 DIAGNOSIS — E559 Vitamin D deficiency, unspecified: Secondary | ICD-10-CM | POA: Insufficient documentation

## 2020-06-26 DIAGNOSIS — M542 Cervicalgia: Secondary | ICD-10-CM | POA: Diagnosis not present

## 2020-06-26 DIAGNOSIS — M792 Neuralgia and neuritis, unspecified: Secondary | ICD-10-CM | POA: Diagnosis present

## 2020-06-26 DIAGNOSIS — G894 Chronic pain syndrome: Secondary | ICD-10-CM | POA: Diagnosis not present

## 2020-06-26 DIAGNOSIS — M79605 Pain in left leg: Secondary | ICD-10-CM | POA: Diagnosis not present

## 2020-06-26 DIAGNOSIS — Z79899 Other long term (current) drug therapy: Secondary | ICD-10-CM | POA: Diagnosis present

## 2020-06-26 DIAGNOSIS — G8929 Other chronic pain: Secondary | ICD-10-CM | POA: Diagnosis present

## 2020-06-26 DIAGNOSIS — M5412 Radiculopathy, cervical region: Secondary | ICD-10-CM | POA: Insufficient documentation

## 2020-06-26 MED ORDER — OXYCODONE HCL 10 MG PO TABS: 10.0000 mg | ORAL_TABLET | Freq: Every day | ORAL | 0 refills | Status: DC

## 2020-06-26 MED ORDER — GABAPENTIN 300 MG PO CAPS: ORAL_CAPSULE | ORAL | 2 refills | Status: DC

## 2020-06-26 MED ORDER — MAGNESIUM OXIDE 400 MG PO TABS: 400.0000 mg | ORAL_TABLET | Freq: Two times a day (BID) | ORAL | 2 refills | Status: AC

## 2020-06-26 MED ORDER — VITAMIN D3 50 MCG (2000 UT) PO CAPS: 2000.0000 [IU] | ORAL_CAPSULE | Freq: Every day | ORAL | 2 refills | Status: AC

## 2020-06-26 NOTE — Progress Notes (Signed)
Nursing Pain Medication Assessment:  Safety precautions to be maintained throughout the outpatient stay will include: orient to surroundings, keep bed in low position, maintain call bell within reach at all times, provide assistance with transfer out of bed and ambulation.  Medication Inspection Compliance: Pill count conducted under aseptic conditions, in front of the patient. Neither the pills nor the bottle was removed from the patient's sight at any time. Once count was completed pills were immediately returned to the patient in their original bottle.  Medication: Oxycodone IR Pill/Patch Count: 140 of 300 pills remain Pill/Patch Appearance: Markings consistent with prescribed medication Bottle Appearance: Standard pharmacy container. Clearly labeled. Filled Date: 24 / 05 / 2021 Last Medication intake:  Today

## 2020-07-02 ENCOUNTER — Other Ambulatory Visit: Payer: Self-pay | Admitting: Cardiovascular Disease

## 2020-07-17 ENCOUNTER — Other Ambulatory Visit: Payer: Medicare Other | Admitting: *Deleted

## 2020-07-17 ENCOUNTER — Other Ambulatory Visit: Payer: Self-pay

## 2020-07-17 DIAGNOSIS — I2510 Atherosclerotic heart disease of native coronary artery without angina pectoris: Secondary | ICD-10-CM

## 2020-07-17 DIAGNOSIS — I10 Essential (primary) hypertension: Secondary | ICD-10-CM

## 2020-07-17 DIAGNOSIS — E782 Mixed hyperlipidemia: Secondary | ICD-10-CM

## 2020-07-17 LAB — LIPID PANEL
Chol/HDL Ratio: 2.4 ratio (ref 0.0–5.0)
Cholesterol, Total: 114 mg/dL (ref 100–199)
HDL: 48 mg/dL (ref >39)
LDL Chol Calc (NIH): 52 mg/dL (ref 0–99)
Triglycerides: 62 mg/dL (ref 0–149)
VLDL Cholesterol Cal: 14 mg/dL (ref 5–40)

## 2020-07-17 LAB — CBC WITH DIFFERENTIAL/PLATELET
Basophils Absolute: 0 10*3/uL (ref 0.0–0.2)
Basos: 0 %
EOS (ABSOLUTE): 0.3 10*3/uL (ref 0.0–0.4)
Eos: 5 %
Hematocrit: 40.1 % (ref 37.5–51.0)
Hemoglobin: 13.7 g/dL (ref 13.0–17.7)
Immature Grans (Abs): 0 10*3/uL (ref 0.0–0.1)
Immature Granulocytes: 0 %
Lymphocytes Absolute: 1.6 10*3/uL (ref 0.7–3.1)
Lymphs: 32 %
MCH: 28.1 pg (ref 26.6–33.0)
MCHC: 34.2 g/dL (ref 31.5–35.7)
MCV: 82 fL (ref 79–97)
Monocytes Absolute: 0.5 10*3/uL (ref 0.1–0.9)
Monocytes: 9 %
Neutrophils Absolute: 2.5 10*3/uL (ref 1.4–7.0)
Neutrophils: 54 %
Platelets: 218 10*3/uL (ref 150–450)
RBC: 4.87 x10E6/uL (ref 4.14–5.80)
RDW: 12.9 % (ref 11.6–15.4)
WBC: 4.8 10*3/uL (ref 3.4–10.8)

## 2020-07-17 LAB — COMPREHENSIVE METABOLIC PANEL
ALT: 34 IU/L (ref 0–44)
AST: 43 IU/L — ABNORMAL HIGH (ref 0–40)
Albumin/Globulin Ratio: 1.2 (ref 1.2–2.2)
Albumin: 3.8 g/dL (ref 3.8–4.8)
Alkaline Phosphatase: 89 IU/L (ref 44–121)
BUN/Creatinine Ratio: 14 (ref 10–24)
BUN: 14 mg/dL (ref 8–27)
Bilirubin Total: 0.8 mg/dL (ref 0.0–1.2)
CO2: 30 mmol/L — ABNORMAL HIGH (ref 20–29)
Calcium: 9.5 mg/dL (ref 8.6–10.2)
Chloride: 99 mmol/L (ref 96–106)
Creatinine, Ser: 0.97 mg/dL (ref 0.76–1.27)
GFR calc Af Amer: 92 mL/min/{1.73_m2} (ref >59)
GFR calc non Af Amer: 79 mL/min/{1.73_m2} (ref >59)
Globulin, Total: 3.3 g/dL (ref 1.5–4.5)
Glucose: 110 mg/dL — ABNORMAL HIGH (ref 65–99)
Potassium: 3.8 mmol/L (ref 3.5–5.2)
Sodium: 138 mmol/L (ref 134–144)
Total Protein: 7.1 g/dL (ref 6.0–8.5)

## 2020-07-19 ENCOUNTER — Other Ambulatory Visit: Payer: Self-pay

## 2020-07-19 ENCOUNTER — Encounter: Payer: Self-pay | Admitting: Cardiovascular Disease

## 2020-07-19 ENCOUNTER — Ambulatory Visit: Payer: Medicare Other | Admitting: Cardiovascular Disease

## 2020-07-19 VITALS — BP 120/80 | HR 57 | Ht 68.0 in | Wt 186.0 lb

## 2020-07-19 DIAGNOSIS — I2510 Atherosclerotic heart disease of native coronary artery without angina pectoris: Secondary | ICD-10-CM

## 2020-07-19 DIAGNOSIS — I10 Essential (primary) hypertension: Secondary | ICD-10-CM | POA: Diagnosis not present

## 2020-07-19 DIAGNOSIS — E782 Mixed hyperlipidemia: Secondary | ICD-10-CM

## 2020-07-19 NOTE — Patient Instructions (Signed)

## 2020-07-19 NOTE — Progress Notes (Signed)
Cardiology Office Note:    Date:  07/19/2020   ID:  [name] [name], DOB 10-01-1950, MRN [id number]  PCP:  Gordan Payment., MD  [facility] Cardiologist:  [name], MD  [facility] Electrophysiologist:  None   Referring MD: Gordan Payment., MD   Chief Complaint  Patient presents with  . Coronary Artery Disease    History of Present Illness:    [name] [name] is a 69 y.o. male with a hx of CAD S/P CABG with early SVG failureand ultimately required PCI of the LIMA-LAD anastomosis. He's been maintained on long term DAPT.He also has a history of hyperlipidemia, hypertension,GERD and chronic pain syndrome.  He is here alone today. He was last seen in outpatient follow-up in June 2021. He tells me how he and his wife has been raising their grandchildren. They are raising their 35 yo grandson and 2 yo grandson. This has been a big challenge for them at their age. From a cardiac perspective, he is doing well. Today, he denies symptoms of palpitations, chest pain, shortness of breath, orthopnea, PND, lower extremity edema, dizziness, or syncope. He had recent blood work and we reviewed this today.    Past Medical History:  Diagnosis Date  . CAD (coronary artery disease)    CABG  . Chronic pain syndrome   . GERD (gastroesophageal reflux disease)   . Hyperlipidemia   . Hypertension   . Radiculitis of right cervical region 09/04/2015    Past Surgical History:  Procedure Laterality Date  . CHOLECYSTECTOMY    . CORONARY ARTERY BYPASS GRAFT     multivessel  . HERNIA REPAIR    . LEFT HEART CATHETERIZATION WITH CORONARY/GRAFT ANGIOGRAM N/A 04/21/2013   Procedure: LEFT HEART CATHETERIZATION WITH [name];  Surgeon: Micheline Chapman, MD;  Location: [facility] C[facility];  Service: Cardiovascular;  Laterality: N/A;  . PCI 2009 with stenting of LIMA-LAD anastamosis      Current Medications: Current Meds  Medication Sig  . aspirin 81 MG tablet Take 1 tablet (81 mg total)  by mouth daily.  Marland Kitchen atorvastatin (LIPITOR) 20 MG tablet TAKE 1 TABLET BY MOUTH EVERY DAY  . Cholecalciferol (VITAMIN D3) 50 MCG (2000 UT) capsule Take 1 capsule (2,000 Units total) by mouth daily.  . clopidogrel (PLAVIX) 75 MG tablet TAKE 1 TABLET BY MOUTH EVERY DAY  . gabapentin (NEURONTIN) 300 MG capsule Take 1 capsule (300 mg total) by mouth daily AND 2 capsules (600 mg total) at bedtime. takes 1 tablet daily and 2 at night.  . hydrochlorothiazide (HYDRODIURIL) 25 MG tablet Take by mouth.  . isosorbide mononitrate (IMDUR) 30 MG 24 hr tablet Take 1 tablet (30 mg total) by mouth daily.  . magnesium oxide (MAG-OX) 400 MG tablet Take 1 tablet (400 mg total) by mouth 2 (two) times daily.  . metoprolol succinate (TOPROL-XL) 50 MG 24 hr tablet TAKE WITH OR IMMEDIATELY FOLLOWING A MEAL.  . nitroGLYCERIN (NITROSTAT) 0.4 MG SL tablet PLACE 1 TABLET (0.4 MG TOTAL) UNDER THE TONGUE EVERY 5 (FIVE) MINUTES AS NEEDED FOR CHEST PAIN.  Marland Kitchen oxyCODONE 10 MG TABS Take 1 tablet (10 mg total) by mouth 5 (five) times daily. Must last 30 days.  [name] ON 08/07/2020] oxyCODONE 10 MG TABS Take 1 tablet (10 mg total) by mouth 5 (five) times daily. Must last 30 days.  [name] ON 09/06/2020] oxyCODONE 10 MG TABS Take 1 tablet (10 mg total) by mouth 5 (five) times daily. Must last 30 days.  Marland Kitchen  pantoprazole (PROTONIX) 40 MG tablet TAKE 1 TABLET BY MOUTH EVERY DAY  . potassium chloride SA (K-DUR,KLOR-CON) 20 MEQ tablet Take 20 mEq by mouth daily.  . vitamin B-12 (CYANOCOBALAMIN) 1000 MCG tablet Take 2,000 mcg by mouth daily.     Allergies:   Patient has no known allergies.   Social History   Socioeconomic History  . Marital status: Married    Spouse name: Not on file  . Number of children: Not on file  . Years of education: Not on file  . Highest education level: Not on file  Occupational History  . Not on file  Tobacco Use  . Smoking status: Former Games developer  . Smokeless tobacco: Never Used  Substance and Sexual  Activity  . Alcohol use: No    Alcohol/week: 0.0 standard drinks  . Drug use: No  . Sexual activity: Not on file  Other Topics Concern  . Not on file  Social History Narrative  . Not on file   Social Determinants of Health   Financial Resource Strain: Not on file  Food Insecurity: Not on file  Transportation Needs: Not on file  Physical Activity: Not on file  Stress: Not on file  Social Connections: Not on file     Family History: The patient's family history includes Multiple sclerosis in his mother; Stroke in his father.  ROS:   Please see the history of present illness.    All other systems reviewed and are negative.  EKGs/Labs/Other Studies Reviewed:    The following studies were reviewed today: Echo 01/29/2020: 1. Left ventricular ejection fraction, by estimation, is 55 to 60%. The  left ventricle has normal function. The left ventricle has no regional  wall motion abnormalities. Left ventricular diastolic parameters are  consistent with Grade II diastolic  dysfunction (pseudonormalization).  2. Right ventricular systolic function is mildly reduced. The right  ventricular size is moderately enlarged. There is mildly elevated  pulmonary artery systolic pressure. The estimated right ventricular  systolic pressure is 39.6 mmHg.  3. Left atrial size was mildly dilated.  4. Right atrial size was mild to moderately dilated.  5. The mitral valve is grossly normal. Mild mitral valve regurgitation.  No evidence of mitral stenosis.  6. Tricuspid valve regurgitation is mild to moderate.  7. The aortic valve is tricuspid. Aortic valve regurgitation is mild. No  aortic stenosis is present.  8. The inferior vena cava is dilated in size with >50% respiratory  variability, suggesting right atrial pressure of 8 mmHg.   Comparison(s): Changes from prior study are noted. LVEF remains normal,  55-60%. Mild to moderate TR. Moderately enlarged RV with mildly reduced   function.   EKG:  EKG is ordered today.  The ekg ordered today demonstrates sinus bradycardia 57 bpm, RBBB, no changes from prior tracings  Recent Labs: 07/17/2020: ALT 34; BUN 14; Creatinine, Ser 0.97; Hemoglobin 13.7; Platelets 218; Potassium 3.8; Sodium 138  Recent Lipid Panel    Component Value Date/Time   CHOL 114 07/17/2020 0948   TRIG 62 07/17/2020 0948   HDL 48 07/17/2020 0948   CHOLHDL 2.4 07/17/2020 0948   CHOLHDL 3.8 06/18/2016 0954   VLDL 19 06/18/2016 0954   LDLCALC 52 07/17/2020 0948   LDLDIRECT 92.7 07/04/2009 0946         Physical Exam:    VS:  BP 120/80   Pulse (!) 57   Ht 5\' 8"  (1.727 m)   Wt 186 lb (84.4 kg)   SpO2 96%  BMI 28.28 kg/m     Wt Readings from Last 3 Encounters:  07/19/20 186 lb (84.4 kg)  06/26/20 185 lb (83.9 kg)  04/09/20 185 lb (83.9 kg)     GEN:  Well nourished, well developed in no acute distress HEENT: Normal NECK: No JVD; No carotid bruits LYMPHATICS: No lymphadenopathy CARDIAC: RRR, 2/6 systolic murmur at the apex RESPIRATORY:  Clear to auscultation without rales, wheezing or rhonchi  ABDOMEN: Soft, non-tender, non-distended MUSCULOSKELETAL:  No edema; No deformity  SKIN: Warm and dry NEUROLOGIC:  Alert and oriented x 3 PSYCHIATRIC:  Normal affect   ASSESSMENT:    1. Coronary artery disease involving native coronary artery of native heart without angina pectoris   2. Essential hypertension   3. Mixed hyperlipidemia    PLAN:    In order of problems listed above:  1. The patient is doing well with no symptoms of angina on his current medical program.  He continues on dual antiplatelet therapy with aspirin and clopidogrel, isosorbide, metoprolol succinate, and atorvastatin.  No changes are made today.  I will see him back in 6 months for follow-up evaluation. 2. Blood pressure well controlled on current medicines.  Most recent labs reviewed. 3. Lipids demonstrated cholesterol of 107, HDL 48, LDL 44, triglycerides 71.   ALT is normal at 26.  Treated with atorvastatin 20 mg daily.  Medication Adjustments/Labs and Tests Ordered: Current medicines are reviewed at length with the patient today.  Concerns regarding medicines are outlined above.  Orders Placed This Encounter  Procedures  . EKG 12-Lead   No orders of the defined types were placed in this encounter.   Patient Instructions  Medication Instructions:  Your provider recommends that you continue on your current medications as directed. Please refer to the Current Medication list given to you today.   *If you need a refill on your cardiac medications before your next appointment, please call your pharmacy*   Follow-Up: At [facility], you and your health needs are our priority.  As part of our continuing mission to provide you with exceptional heart care, we have created designated Provider Care Teams.  These Care Teams include your primary Cardiologist (physician) and Advanced Practice Providers (APPs -  Physician Assistants and Nurse Practitioners) who all work together to provide you with the care you need, when you need it. Your next appointment:   6 month(s) The format for your next appointment:   In Person Provider:   You may see [name], MD or one of the following Advanced Practice Providers on your designated Care Team:    Tereso Newcomer, PA-C  Chelsea Aus, [place]      Signed, [name], MD  07/19/2020 12:17 PM    [facility]

## 2020-08-04 ENCOUNTER — Other Ambulatory Visit: Payer: Self-pay | Admitting: Cardiovascular Disease

## 2020-08-04 DIAGNOSIS — I10 Essential (primary) hypertension: Secondary | ICD-10-CM

## 2020-08-04 DIAGNOSIS — E785 Hyperlipidemia, unspecified: Secondary | ICD-10-CM

## 2020-08-07 ENCOUNTER — Other Ambulatory Visit: Payer: Self-pay | Admitting: Pain Medicine

## 2020-08-07 DIAGNOSIS — E559 Vitamin D deficiency, unspecified: Secondary | ICD-10-CM

## 2020-08-19 ENCOUNTER — Other Ambulatory Visit: Payer: Self-pay | Admitting: Pain Medicine

## 2020-08-19 DIAGNOSIS — K219 Gastro-esophageal reflux disease without esophagitis: Secondary | ICD-10-CM

## 2020-09-02 ENCOUNTER — Telehealth: Payer: Self-pay | Admitting: Cardiovascular Disease

## 2020-09-02 NOTE — Telephone Encounter (Signed)
I am concerned that he's having recurrent angina. If his symptoms aren't improved after a few days of making the medication change back to Imdur, I would recommend getting him in to see Lorin Picket or setting up a virtual visit to evaluate him. Agree with ER precautions. thanks

## 2020-09-02 NOTE — Telephone Encounter (Signed)
Attempted to call patient. No VM picked up to leave message. Will try again later.

## 2020-09-02 NOTE — Telephone Encounter (Signed)
Pt c/o medication issue:  1. Name of Medication: isosorbide mononitrate (IMDUR) 30 MG 24 hr tablet  2. How are you currently taking this medication (dosage and times per day)? 1 tablet daily  3. Are you having a reaction (difficulty breathing--STAT)?   4. What is your medication issue? Patient states he has been taking isosorbide mononitrate, but was given isosorbide dinitrate and is not sure if they are the same.  Pt c/o of Chest Pain: STAT if CP now or developed within 24 hours  1. Are you having CP right now? yes  2. Are you experiencing any other symptoms (ex. SOB, nausea, vomiting, sweating)? no  3. How long have you been experiencing CP? Couple of days, started when took binitrate  4. Is your CP continuous or coming and going? continous  5. Have you taken Nitroglycerin? Yes, eased up after 1st   Patient states he has been having chest pain for a couple of days and noticed it when he started taking isosorbide dinitrate instead of mononitrate. He states his PCP gave him the dinitrate and is not sure if it is causing the chest pain or not. He states his BP has also been up as well. He states his BP went up last night 1am 155/88 HR112, later 116/58 HR 91, 114/64 HR 87 1:27 am, 2:19 am 137/76 HR 68, states around 10:30 am today it was closer to normal. Patient states he did take 1 nitroglycerin and wanted to call the office before taking another one.

## 2020-09-02 NOTE — Telephone Encounter (Signed)
Pt called in with a compliant of chest pain and medication issues. Pt described the chest pain as non radiating, just enough to bother him. Pt denied any SOB, N/V, sweating. Pt stated that he picked up prescription for isosorbide dinitrate 3 days ago, which he stated was prescribed by PCP Dr. Shary Decamp.  Pt stated that he normal takes  isosorbide mononitrate, prescribed by Dr. Excell Seltzer.  Pt noticed this was a different medication from what Dr. Excell Seltzer had prescribed in the past d/t it having a different name and looked different. Pt stated that his wife said it was the same medication and he could take it, so he did, and after taking  isosorbide dinitrate, he had chest pain. Pt stated he took medication the next day and had some chest pain afterwards as well. Pt stated that he continued to have chest pain today, still non radiating and no other symptoms, denied N/V, SOB and sweating. Pt stated that he took 1 dose of nitroglycerin, and didn't want to take anymore nitroglycerin, or the new medication, today before talking to the office. Pt stated his b/p was 153/83, HR 67 in the right arm.   Rn spoke with [facility] Pharm[facility],  regarding the medication issues and the pt possibly taking both medications and if this could be causing the symptoms he was encountering. Aundra Millet Supple stated that the patient needed to quite taking the isosorbide dinitrate for good, and go back to the isosorbide mononitrate, which would help with his b/p and chest evenly throughout the day.   Rn called the patient back and stated that the Pharm recommended that the patient stop taking the isosorbide dinitrate for good, and to start taking the isosorbide mononitrate, pt stated that he didn't have any of that medication; RN stated a prescription would be put in for the medication and when the medication was ready for him to take it and to call back and let RN know if the chest pain had improved. If the chest pain has not improved, then RN stated to call  EMS.    Rn routed to Dr. Excell Seltzer and Edythe Lynn for further review.

## 2020-09-02 NOTE — Telephone Encounter (Signed)
Rn called patient to follow up, and patient stated that he had not gotten his medication yet; pt stated that his b/p 145/73, HR 59; pt stated that he was not having any chest pain at this time. Rn stated that Dr. Excell Seltzer wanted him to take his medication for 2 days and then call the office to report how any new medication issues. Pt aware to call back with any new issue and if symptoms returned or got worse, pt will call office or EMS, per ER precautions.
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
## 2020-09-25 ENCOUNTER — Other Ambulatory Visit: Payer: Self-pay

## 2020-09-25 ENCOUNTER — Encounter: Payer: Self-pay | Admitting: Pain Medicine

## 2020-09-25 ENCOUNTER — Ambulatory Visit: Payer: Medicare Other | Attending: Pain Medicine | Admitting: Pain Medicine

## 2020-09-25 VITALS — BP 139/99 | HR 85 | Temp 97.1°F | Ht 68.0 in | Wt 187.0 lb

## 2020-09-25 DIAGNOSIS — M542 Cervicalgia: Secondary | ICD-10-CM | POA: Diagnosis present

## 2020-09-25 DIAGNOSIS — M79605 Pain in left leg: Secondary | ICD-10-CM | POA: Insufficient documentation

## 2020-09-25 DIAGNOSIS — G8929 Other chronic pain: Secondary | ICD-10-CM | POA: Insufficient documentation

## 2020-09-25 DIAGNOSIS — Z79899 Other long term (current) drug therapy: Secondary | ICD-10-CM | POA: Diagnosis present

## 2020-09-25 DIAGNOSIS — M4802 Spinal stenosis, cervical region: Secondary | ICD-10-CM | POA: Insufficient documentation

## 2020-09-25 DIAGNOSIS — Z79891 Long term (current) use of opiate analgesic: Secondary | ICD-10-CM | POA: Insufficient documentation

## 2020-09-25 DIAGNOSIS — G894 Chronic pain syndrome: Secondary | ICD-10-CM | POA: Diagnosis not present

## 2020-09-25 DIAGNOSIS — F1120 Opioid dependence, uncomplicated: Secondary | ICD-10-CM | POA: Insufficient documentation

## 2020-09-25 DIAGNOSIS — M5412 Radiculopathy, cervical region: Secondary | ICD-10-CM | POA: Diagnosis present

## 2020-09-25 DIAGNOSIS — M5442 Lumbago with sciatica, left side: Secondary | ICD-10-CM | POA: Diagnosis present

## 2020-09-25 MED ORDER — OXYCODONE HCL 10 MG PO TABS
10.0000 mg | ORAL_TABLET | Freq: Every day | ORAL | 0 refills | Status: DC
Start: 2020-11-05 — End: 2021-01-01

## 2020-09-25 MED ORDER — OXYCODONE HCL 10 MG PO TABS: 10.0000 mg | ORAL_TABLET | Freq: Every day | ORAL | 0 refills | Status: DC

## 2020-09-25 MED ORDER — OXYCODONE HCL 10 MG PO TABS
10.0000 mg | ORAL_TABLET | Freq: Every day | ORAL | 0 refills | Status: DC
Start: 2020-10-06 — End: 2021-01-01

## 2020-09-25 NOTE — Progress Notes (Signed)
Nursing Pain Medication Assessment:  Safety precautions to be maintained throughout the outpatient stay will include: orient to surroundings, keep bed in low position, maintain call bell within reach at all times, provide assistance with transfer out of bed and ambulation.  Medication Inspection Compliance: Pill count conducted under aseptic conditions, in front of the patient. Neither the pills nor the bottle was removed from the patient's sight at any time. Once count was completed pills were immediately returned to the patient in their original bottle.  Medication: Oxycodone IR Pill/Patch Count: 60 of 150 pills remain Pill/Patch Appearance: Markings consistent with prescribed medication Bottle Appearance: Standard pharmacy container. Clearly labeled. Filled Date: 2 / 4 / 22 Last Medication intake:  TodaySafety precautions to be maintained throughout the outpatient stay will include: orient to surroundings, keep bed in low position, maintain call bell within reach at all times, provide assistance with transfer out of bed and ambulation.

## 2020-10-15 ENCOUNTER — Other Ambulatory Visit: Payer: Self-pay | Admitting: Cardiovascular Disease

## 2020-12-17 ENCOUNTER — Encounter (HOSPITAL_COMMUNITY): Payer: Self-pay | Admitting: Cardiovascular Disease

## 2021-01-01 ENCOUNTER — Ambulatory Visit
Admission: RE | Admit: 2021-01-01 | Discharge: 2021-01-01 | Disposition: A | Discharge status: Home / Self Care | Payer: Medicare Other | Source: Ambulatory Visit | Attending: Pain Medicine | Admitting: Pain Medicine

## 2021-01-01 ENCOUNTER — Encounter: Payer: Self-pay | Admitting: Pain Medicine

## 2021-01-01 ENCOUNTER — Other Ambulatory Visit: Payer: Self-pay

## 2021-01-01 ENCOUNTER — Ambulatory Visit (HOSPITAL_BASED_OUTPATIENT_CLINIC_OR_DEPARTMENT_OTHER): Payer: Medicare Other | Admitting: Pain Medicine

## 2021-01-01 VITALS — BP 150/84 | HR 71 | Temp 97.2°F | Resp 18 | Ht 68.0 in | Wt 193.0 lb

## 2021-01-01 DIAGNOSIS — M4802 Spinal stenosis, cervical region: Secondary | ICD-10-CM

## 2021-01-01 DIAGNOSIS — M792 Neuralgia and neuritis, unspecified: Secondary | ICD-10-CM

## 2021-01-01 DIAGNOSIS — M5412 Radiculopathy, cervical region: Secondary | ICD-10-CM | POA: Insufficient documentation

## 2021-01-01 DIAGNOSIS — M65331 Trigger finger, right middle finger: Secondary | ICD-10-CM | POA: Insufficient documentation

## 2021-01-01 DIAGNOSIS — M542 Cervicalgia: Secondary | ICD-10-CM | POA: Insufficient documentation

## 2021-01-01 DIAGNOSIS — Z79891 Long term (current) use of opiate analgesic: Secondary | ICD-10-CM | POA: Insufficient documentation

## 2021-01-01 DIAGNOSIS — Z79899 Other long term (current) drug therapy: Secondary | ICD-10-CM | POA: Insufficient documentation

## 2021-01-01 DIAGNOSIS — G8929 Other chronic pain: Secondary | ICD-10-CM | POA: Insufficient documentation

## 2021-01-01 DIAGNOSIS — G894 Chronic pain syndrome: Secondary | ICD-10-CM | POA: Insufficient documentation

## 2021-01-01 MED ORDER — OXYCODONE HCL 10 MG PO TABS: 10.0000 mg | ORAL_TABLET | Freq: Every day | ORAL | 0 refills | Status: DC

## 2021-01-01 MED ORDER — DIAZEPAM 5 MG PO TABS: 5.0000 mg | ORAL_TABLET | ORAL | 0 refills | Status: DC | PRN

## 2021-01-01 NOTE — Progress Notes (Signed)
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 patient. The patient was provided an opportunity to ask questions and all were answered. The patient agreed with the plan and demonstrated an understanding of the instructions.  Patient advised to call back or seek an in-person evaluation if the symptoms or condition worsens.  Duration of encounter: 41 minutes.  Note by: Gaspar Cola, MD Date: 01/01/2021; Time: 10:21 AM

## 2021-01-01 NOTE — Progress Notes (Signed)
Nursing Pain Medication Assessment:  Safety precautions to be maintained throughout the outpatient stay will include: orient to surroundings, keep bed in low position, maintain call bell within reach at all times, provide assistance with transfer out of bed and ambulation.  Medication Inspection Compliance: Pill count conducted under aseptic conditions, in front of the patient. Neither the pills nor the bottle was removed from the patient's sight at any time. Once count was completed pills were immediately returned to the patient in their original bottle.  Medication: Oxycodone IR Pill/Patch Count: 25 of 150 pills remain Pill/Patch Appearance: Markings consistent with prescribed medication Bottle Appearance: Standard pharmacy container. Clearly labeled. Filled Date: 05 / 05 / 2022 Last Medication intake:  Today

## 2021-01-01 NOTE — Patient Instructions (Signed)
____________________________________________________________________________________________  Medication Rules  Purpose: To inform patients, and their family members, of our rules and regulations.  Applies to: All patients receiving prescriptions (written or electronic).  Pharmacy of record: Pharmacy where electronic prescriptions will be sent. If written prescriptions are taken to a different pharmacy, please inform the nursing staff. The pharmacy listed in the electronic medical record should be the one where you would like electronic prescriptions to be sent.  Electronic prescriptions: In compliance with the [place] Strengthen Opioid Misuse Prevention (STOP) Act of 2017 (Session Law 2017-74/H243), effective August 03, 2018, all controlled substances must be electronically prescribed. Calling prescriptions to the pharmacy will cease to exist.  Prescription refills: Only during scheduled appointments. Applies to all prescriptions.  NOTE: The following applies primarily to controlled substances (Opioid* Pain Medications).   Type of encounter (visit): For patients receiving controlled substances, face-to-face visits are required. (Not an option or up to the patient.)  Patient's responsibilities: 1. Pain Pills: Bring all pain pills to every appointment (except for procedure appointments). 2. Pill Bottles: Bring pills in original pharmacy bottle. Always bring the newest bottle. Bring bottle, even if empty. 3. Medication refills: You are responsible for knowing and keeping track of what medications you take and those you need refilled. The day before your appointment: write a list of all prescriptions that need to be refilled. The day of the appointment: give the list to the admitting nurse. Prescriptions will be written only during appointments. No prescriptions will be written on procedure days. If you forget a medication: it will not be "Called in", "Faxed", or "electronically sent".  You will need to get another appointment to get these prescribed. No early refills. Do not call asking to have your prescription filled early. 4. Prescription Accuracy: You are responsible for carefully inspecting your prescriptions before leaving our office. Have the discharge nurse carefully go over each prescription with you, before taking them home. Make sure that your name is accurately spelled, that your address is correct. Check the name and dose of your medication to make sure it is accurate. Check the number of pills, and the written instructions to make sure they are clear and accurate. Make sure that you are given enough medication to last until your next medication refill appointment. 5. Taking Medication: Take medication as prescribed. When it comes to controlled substances, taking less pills or less frequently than prescribed is permitted and encouraged. Never take more pills than instructed. Never take medication more frequently than prescribed.  6. Inform other Doctors: Always inform, all of your healthcare providers, of all the medications you take. 7. Pain Medication from other Providers: You are not allowed to accept any additional pain medication from any other Doctor or Healthcare provider. There are two exceptions to this rule. (see below) In the event that you require additional pain medication, you are responsible for notifying us, as stated below. 8. Cough Medicine: Often these contain an opioid, such as codeine or hydrocodone. Never accept or take cough medicine containing these opioids if you are already taking an opioid* medication. The combination may cause respiratory failure and death. 9. Medication Agreement: You are responsible for carefully reading and following our Medication Agreement. This must be signed before receiving any prescriptions from our practice. Safely store a copy of your signed Agreement. Violations to the Agreement will result in no further prescriptions.  (Additional copies of our Medication Agreement are available upon request.) 10. Laws, Rules, & Regulations: All patients are expected to follow all   Federal and State Laws, Statutes, Rules, & Regulations. Ignorance of the Laws does not constitute a valid excuse.  11. Illegal drugs and Controlled Substances: The use of illegal substances (including, but not limited to marijuana and its derivatives) and/or the illegal use of any controlled substances is strictly prohibited. Violation of this rule may result in the immediate and permanent discontinuation of any and all prescriptions being written by our practice. The use of any illegal substances is prohibited. 12. Adopted CDC guidelines & recommendations: Target dosing levels will be at or below 60 MME/day. Use of benzodiazepines** is not recommended.  Exceptions: There are only two exceptions to the rule of not receiving pain medications from other Healthcare Providers. 1. Exception #1 (Emergencies): In the event of an emergency (i.e.: accident requiring emergency care), you are allowed to receive additional pain medication. However, you are responsible for: As soon as you are able, call our office [phone], at any time of the day or night, and leave a message stating your name, the date and nature of the emergency, and the name and dose of the medication prescribed. In the event that your call is answered by a member of our staff, make sure to document and save the date, time, and the name of the person that took your information.  2. Exception #2 (Planned Surgery): In the event that you are scheduled by another doctor or dentist to have any type of surgery or procedure, you are allowed (for a period no longer than 30 days), to receive additional pain medication, for the acute post-op pain. However, in this case, you are responsible for picking up a copy of our "Post-op Pain Management for Surgeons" handout, and giving it to your surgeon or dentist. This  document is available at our office, and does not require an appointment to obtain it. Simply go to our office during business hours (Monday-Thursday from 8:00 AM to 4:00 PM) (Friday 8:00 AM to 12:00 Noon) or if you have a scheduled appointment with us, prior to your surgery, and ask for it by name. In addition, you are responsible for: calling our office [phone], at any time of the day or night, and leaving a message stating your name, name of your surgeon, type of surgery, and date of procedure or surgery. Failure to comply with your responsibilities may result in termination of therapy involving the controlled substances.  *Opioid medications include: morphine, codeine, oxycodone, oxymorphone, hydrocodone, hydromorphone, meperidine, tramadol, tapentadol, buprenorphine, fentanyl, methadone. **Benzodiazepine medications include: diazepam (Valium), alprazolam (Xanax), clonazepam (Klonopine), lorazepam (Ativan), clorazepate (Tranxene), chlordiazepoxide (Librium), estazolam (Prosom), oxazepam (Serax), temazepam (Restoril), triazolam (Halcion) (Last updated: 07/01/2020) ____________________________________________________________________________________________   ____________________________________________________________________________________________  Medication Recommendations and Reminders  Applies to: All patients receiving prescriptions (written and/or electronic).  Medication Rules & Regulations: These rules and regulations exist for your safety and that of others. They are not flexible and neither are we. Dismissing or ignoring them will be considered "non-compliance" with medication therapy, resulting in complete and irreversible termination of such therapy. (See document titled "Medication Rules" for more details.) In all conscience, because of safety reasons, we cannot continue providing a therapy where the patient does not follow instructions.  Pharmacy of record:   Definition:  This is the pharmacy where your electronic prescriptions will be sent.   We do not endorse any particular pharmacy, however, we have experienced problems with [facility] not securing enough medication supply for the community.  We do not restrict you in your choice of pharmacy. However,   once we write for your prescriptions, we will NOT be re-sending more prescriptions to fix restricted supply problems created by your pharmacy, or your insurance.   The pharmacy listed in the electronic medical record should be the one where you want electronic prescriptions to be sent.  If you choose to change pharmacy, simply notify our nursing staff.  Recommendations:  Keep all of your pain medications in a safe place, under lock and key, even if you live alone. We will NOT replace lost, stolen, or damaged medication.  After you fill your prescription, take 1 week's worth of pills and put them away in a safe place. You should keep a separate, properly labeled bottle for this purpose. The remainder should be kept in the original bottle. Use this as your primary supply, until it runs out. Once it's gone, then you know that you have 1 week's worth of medicine, and it is time to come in for a prescription refill. If you do this correctly, it is unlikely that you will ever run out of medicine.  To make sure that the above recommendation works, it is very important that you make sure your medication refill appointments are scheduled at least 1 week before you run out of medicine. To do this in an effective manner, make sure that you do not leave the office without scheduling your next medication management appointment. Always ask the nursing staff to show you in your prescription , when your medication will be running out. Then arrange for the receptionist to get you a return appointment, at least 7 days before you run out of medicine. Do not wait until you have 1 or 2 pills left, to come in. This is very poor planning and  does not take into consideration that we may need to cancel appointments due to bad weather, sickness, or emergencies affecting our staff.  DO NOT ACCEPT A "Partial Fill": If for any reason your pharmacy does not have enough pills/tablets to completely fill or refill your prescription, do not allow for a "partial fill". The law allows the pharmacy to complete that prescription within 72 hours, without requiring a new prescription. If they do not fill the rest of your prescription within those 72 hours, you will need a separate prescription to fill the remaining amount, which we will NOT provide. If the reason for the partial fill is your insurance, you will need to talk to the pharmacist about payment alternatives for the remaining tablets, but again, DO NOT ACCEPT A PARTIAL FILL, unless you can trust your pharmacist to obtain the remainder of the pills within 72 hours.  Prescription refills and/or changes in medication(s):   Prescription refills, and/or changes in dose or medication, will be conducted only during scheduled medication management appointments. (Applies to both, written and electronic prescriptions.)  No refills on procedure days. No medication will be changed or started on procedure days. No changes, adjustments, and/or refills will be conducted on a procedure day. Doing so will interfere with the diagnostic portion of the procedure.  No phone refills. No medications will be "called into the pharmacy".  No Fax refills.  No weekend refills.  No Holliday refills.  No after hours refills.  Remember:  Business hours are:  Monday to Thursday 8:00 AM to 4:00 PM Provider's Schedule: Shaylea Ucci, MD - Appointments are:  Medication management: Monday and Wednesday 8:00 AM to 4:00 PM Procedure day: Tuesday and Thursday 7:30 AM to 4:00 PM Bilal Lateef, MD - Appointments are:    Medication management: Tuesday and Thursday 8:00 AM to 4:00 PM Procedure day: Monday and Wednesday  7:30 AM to 4:00 PM (Last update: 02/21/2020) ____________________________________________________________________________________________   ____________________________________________________________________________________________  CBD (cannabidiol) WARNING  Applicable to: All individuals currently taking or considering taking CBD (cannabidiol) and, more important, all patients taking opioid analgesic controlled substances (pain medication). (Example: oxycodone; oxymorphone; hydrocodone; hydromorphone; morphine; methadone; tramadol; tapentadol; fentanyl; buprenorphine; butorphanol; dextromethorphan; meperidine; codeine; etc.)  Legal status: CBD remains a Schedule I drug prohibited for any use. CBD is illegal with one exception. In the [place], CBD has a limited Food and Drug Administration (FDA) approval for the treatment of two specific types of epilepsy disorders. Only one CBD product has been approved by the FDA for this purpose: "Epidiolex". FDA is aware that some companies are marketing products containing cannabis and cannabis-derived compounds in ways that violate the Federal Food, Drug and Cosmetic Act (FD&C Act) and that may put the health and safety of consumers at risk. The FDA, a Federal agency, has not enforced the CBD status since 2018.   Legality: Some manufacturers ship CBD products nationally, which is illegal. Often such products are sold online and are therefore available throughout the country. CBD is openly sold in head shops and health food stores in some states where such sales have not been explicitly legalized. Selling unapproved products with unsubstantiated therapeutic claims is not only a violation of the law, but also can put patients at risk, as these products have not been proven to be safe or effective. Federal illegality makes it difficult to conduct research on CBD.  Reference: "FDA Regulation of Cannabis and Cannabis-Derived Products, Including Cannabidiol  (CBD)" - https://www.fda.gov/news-events/public-health-focus/fda-regulation-cannabis-and-cannabis-derived-products-including-cannabidiol-cbd  Warning: CBD is not FDA approved and has not undergo the same manufacturing controls as prescription drugs.  This means that the purity and safety of available CBD may be questionable. Most of the time, despite manufacturer's claims, it is contaminated with THC (delta-9-tetrahydrocannabinol - the chemical in marijuana responsible for the "HIGH").  When this is the case, the THC contaminant will trigger a positive urine drug screen (UDS) test for Marijuana (carboxy-THC). Because a positive UDS for any illicit substance is a violation of our medication agreement, your opioid analgesics (pain medicine) may be permanently discontinued.  MORE ABOUT CBD  General Information: CBD  is a derivative of the Marijuana (cannabis sativa) plant discovered in 1940. It is one of the 113 identified substances found in Marijuana. It accounts for up to 40% of the plant's extract. As of 2018, preliminary clinical studies on CBD included research for the treatment of anxiety, movement disorders, and pain. CBD is available and consumed in multiple forms, including inhalation of smoke or vapor, as an aerosol spray, and by mouth. It may be supplied as an oil containing CBD, capsules, dried cannabis, or as a liquid solution. CBD is thought not to be as psychoactive as THC (delta-9-tetrahydrocannabinol - the chemical in marijuana responsible for the "HIGH"). Studies suggest that CBD may interact with different biological target receptors in the body, including cannabinoid and other neurotransmitter receptors. As of 2018 the mechanism of action for its biological effects has not been determined.  Side-effects  Adverse reactions: Dry mouth, diarrhea, decreased appetite, fatigue, drowsiness, malaise, weakness, sleep disturbances, and others.  Drug interactions: CBC may interact with other  medications such as blood-thinners. (Last update: 03/09/2020) ____________________________________________________________________________________________   ____________________________________________________________________________________________  Drug Holidays (Slow)  What is a "Drug Holiday"? Drug Holiday: is the name given to the period of time during   which a patient stops taking a medication(s) for the purpose of eliminating tolerance to the drug.  Benefits . Improved effectiveness of opioids. . Decreased opioid dose needed to achieve benefits. . Improved pain with lesser dose.  What is tolerance? Tolerance: is the progressive decreased in effectiveness of a drug due to its repetitive use. With repetitive use, the body gets use to the medication and as a consequence, it loses its effectiveness. This is a common problem seen with opioid pain medications. As a result, a larger dose of the drug is needed to achieve the same effect that used to be obtained with a smaller dose.  How long should a "Drug Holiday" last? You should stay off of the pain medicine for at least 14 consecutive days. (2 weeks)  Should I stop the medicine "cold turkey"? No. You should always coordinate with your Pain Specialist so that he/she can provide you with the correct medication dose to make the transition as smoothly as possible.  How do I stop the medicine? Slowly. You will be instructed to decrease the daily amount of pills that you take by one (1) pill every seven (7) days. This is called a "slow downward taper" of your dose. For example: if you normally take four (4) pills per day, you will be asked to drop this dose to three (3) pills per day for seven (7) days, then to two (2) pills per day for seven (7) days, then to one (1) per day for seven (7) days, and at the end of those last seven (7) days, this is when the "Drug Holiday" would start.   Will I have withdrawals? By doing a "slow downward  taper" like this one, it is unlikely that you will experience any significant withdrawal symptoms. Typically, what triggers withdrawals is the sudden stop of a high dose opioid therapy. Withdrawals can usually be avoided by slowly decreasing the dose over a prolonged period of time. If you do not follow these instructions and decide to stop your medication abruptly, withdrawals may be possible.  What are withdrawals? Withdrawals: refers to the wide range of symptoms that occur after stopping or dramatically reducing opiate drugs after heavy and prolonged use. Withdrawal symptoms do not occur to patients that use low dose opioids, or those who take the medication sporadically. Contrary to benzodiazepine (example: Valium, Xanax, etc.) or alcohol withdrawals ("Delirium Tremens"), opioid withdrawals are not lethal. Withdrawals are the physical manifestation of the body getting rid of the excess receptors.  Expected Symptoms Early symptoms of withdrawal may include: . Agitation . Anxiety . Muscle aches . Increased tearing . Insomnia . Runny nose . Sweating . Yawning  Late symptoms of withdrawal may include: . Abdominal cramping . Diarrhea . Dilated pupils . Goose bumps . Nausea . Vomiting  Will I experience withdrawals? Due to the slow nature of the taper, it is very unlikely that you will experience any.  What is a slow taper? Taper: refers to the gradual decrease in dose.  (Last update: 02/21/2020) ____________________________________________________________________________________________     

## 2021-01-14 NOTE — Progress Notes (Signed)
Cardiology Office Note:    Date:  01/15/2021   ID:  [name] [name], DOB 03-03-1951, MRN [id number]  PCP:  Gordan Payment., MD   [facility] Providers Cardiologist:  [name], MD     Referring MD: Gordan Payment., MD   Chief Complaint:  Follow-up (CAD)    Patient Profile:    [name] [name] is a 70 y.o. male with:  Coronary artery disease  S/p CABG with early SVG failure S/p PCI of L-LAD anastomosis  Long term DAPT Hyperlipidemia  Hypertension  GERD Chronic pain S/p cholecystectomy   Prior CV studies: Echocardiogram 01/29/20 EF 55-60, no RWMA, GRII DD, mildly reduced RVSF, mildly elevated PASP, RVSP 39.6, mild LAE, mild-moderate RAE, mild MR, mild-moderate TR, mild AI  Cardiac catheterization 04/21/13 LM patent LAD prox 50-60, mid 95, D1 80 LCx prox 50, OM1 90, OM2 90 RCA mid 50; branch vessels small with severe diffuse disease L-LAD stent at anastomotic site patent with 70-80 beyond graft insertion  Myoview 04/07/13 Inf-lat defect suggestive of infarction with some peri-infarct ischemia, EF 60; Intermediate risk   History of Present Illness: Mr. Bozzi was last seen by dr. Excell Seltzer in 12/21.  He returns for f/u.  He is here alone.  He has been doing well without exertional chest discomfort, shortness of breath, syncope, orthopnea.  He does have some dependent leg edema.  This improves with elevation.  He has a lot of neck problems and may need surgery in the near future.  He does tend to get neck, bilateral shoulder and arm pain with this.        Past Medical History:  Diagnosis Date   CAD (coronary artery disease)    CABG   Chronic pain syndrome    GERD (gastroesophageal reflux disease)    Hyperlipidemia    Hypertension    Radiculitis of right cervical region 09/04/2015    Current Medications: Current Meds  Medication Sig   aspirin 81 MG tablet Take 1 tablet (81 mg total) by mouth daily.   atorvastatin (LIPITOR) 20 MG tablet TAKE 1 TABLET BY MOUTH EVERY  DAY   Cholecalciferol (VITAMIN D3) 50 MCG (2000 UT) capsule Take 1 capsule (2,000 Units total) by mouth daily.   clopidogrel (PLAVIX) 75 MG tablet TAKE 1 TABLET BY MOUTH EVERY DAY   diazepam (VALIUM) 5 MG tablet Take 1 tablet (5 mg total) by mouth as needed for up to 2 doses for anxiety (Take one tab 45 minutes before MRI. Take second tablet just prior to MRI scan). Do not take medication within 4 hours of taking opioid pain medications. Must have a driver. Do not drive or operate machinery x 24 hours after taking this medication.   gabapentin (NEURONTIN) 300 MG capsule Take 1 capsule (300 mg total) by mouth daily AND 2 capsules (600 mg total) at bedtime. takes 1 tablet daily and 2 at night.   hydrochlorothiazide (HYDRODIURIL) 25 MG tablet Take 25 mg by mouth.   isosorbide mononitrate (IMDUR) 30 MG 24 hr tablet TAKE 1 TABLET BY MOUTH EVERY DAY   metoprolol succinate (TOPROL-XL) 50 MG 24 hr tablet TAKE WITH OR IMMEDIATELY FOLLOWING A MEAL.   nitroGLYCERIN (NITROSTAT) 0.4 MG SL tablet PLACE 1 TABLET (0.4 MG TOTAL) UNDER THE TONGUE EVERY 5 (FIVE) MINUTES AS NEEDED FOR CHEST PAIN.   Oxycodone HCl 10 MG TABS Take 1 tablet (10 mg total) by mouth 5 (five) times daily. Must last 30 days.   pantoprazole (PROTONIX) 40 MG tablet TAKE  1 TABLET BY MOUTH EVERY DAY   potassium chloride SA (K-DUR,KLOR-CON) 20 MEQ tablet Take 20 mEq by mouth daily.   vitamin B-12 (CYANOCOBALAMIN) 1000 MCG tablet Take 2,000 mcg by mouth daily.     Allergies:   Patient has no known allergies.   Social History   Tobacco Use   Smoking status: Former    Pack years: 0.00   Smokeless tobacco: Never  Substance Use Topics   Alcohol use: No    Alcohol/week: 0.0 standard drinks   Drug use: No     Family Hx: The patient's family history includes Multiple sclerosis in his mother; Stroke in his father.  Review of Systems  Cardiovascular:  Negative for claudication.    EKGs/Labs/Other Test Reviewed:    EKG:  EKG is not ordered  today.  The ekg ordered today demonstrates n/a  Recent Labs: 07/17/2020: ALT 34; BUN 14; Creatinine, Ser 0.97; Hemoglobin 13.7; Platelets 218; Potassium 3.8; Sodium 138   Recent Lipid Panel Lab Results  Component Value Date/Time   CHOL 114 07/17/2020 09:48 AM   TRIG 62 07/17/2020 09:48 AM   HDL 48 07/17/2020 09:48 AM   LDLCALC 52 07/17/2020 09:48 AM   LDLDIRECT 92.7 07/04/2009 09:46 AM      Risk Assessment/Calculations:      Physical Exam:    VS:  BP 132/74   Pulse 62   Ht 5\' 8"  (1.727 m)   Wt 194 lb (88 kg)   SpO2 98%   BMI 29.50 kg/m     Wt Readings from Last 3 Encounters:  01/15/21 194 lb (88 kg)  01/01/21 193 lb (87.5 kg)  09/25/20 187 lb (84.8 kg)     Constitutional:      Appearance: Healthy appearance. Not in distress.  Neck:     Vascular: JVD normal.  Pulmonary:     Effort: Pulmonary effort is normal.     Breath sounds: No wheezing. No rales.  Cardiovascular:     Normal rate. Regular rhythm. Normal S1. Normal S2.      Murmurs: There is no murmur.  Edema:    Peripheral edema absent.  Abdominal:     Palpations: Abdomen is soft. There is no hepatomegaly.  Skin:    General: Skin is warm and dry.  Neurological:     General: No focal deficit present.     Mental Status: Alert and oriented to person, place and time.     Cranial Nerves: Cranial nerves are intact.        ASSESSMENT & PLAN:    1. Coronary artery disease involving native coronary artery of native heart without angina pectoris History of CABG with early graft failure and subsequent PCI of the LIMA-LAD anastomosis.  He remains on long-term dual antiplatelet therapy.  He is doing well without anginal symptoms.  Continue current dose of aspirin, atorvastatin, clopidogrel, isosorbide, metoprolol succinate.  Of note, he did have an echocardiogram last June with mild RV dysfunction and mildly elevated pulmonary artery systolic pressure.  A repeat echocardiogram is scheduled for later this month.   Follow-up with Dr. July in 6 months.  2. Essential hypertension Blood pressures at home are optimal.  Continue current dose of isosorbide mononitrate, HCTZ, metoprolol succinate.  3. Mixed hyperlipidemia LDL optimal on most recent lab work.  Continue current Rx.            Dispo:  Return in about 6 months (around 07/17/2021) for Routine follow up in 6 months with Dr. 07/19/2021. Excell Seltzer  Medication Adjustments/Labs and Tests Ordered: Current medicines are reviewed at length with the patient today.  Concerns regarding medicines are outlined above.  Tests Ordered: No orders of the defined types were placed in this encounter.  Medication Changes: No orders of the defined types were placed in this encounter.   Signed, Tereso Newcomer, PA-C  01/15/2021 9:57 AM    [facility] [address] [place], [place], [place]  [address] Phone: [phone]; Fax: [phone]

## 2021-01-15 ENCOUNTER — Encounter: Payer: Self-pay | Admitting: Physician Assistant

## 2021-01-15 ENCOUNTER — Ambulatory Visit: Payer: Medicare Other | Admitting: Physician Assistant

## 2021-01-15 ENCOUNTER — Other Ambulatory Visit: Payer: Self-pay

## 2021-01-15 VITALS — BP 132/74 | HR 62 | Ht 68.0 in | Wt 194.0 lb

## 2021-01-15 DIAGNOSIS — I2510 Atherosclerotic heart disease of native coronary artery without angina pectoris: Secondary | ICD-10-CM | POA: Diagnosis not present

## 2021-01-15 DIAGNOSIS — E782 Mixed hyperlipidemia: Secondary | ICD-10-CM | POA: Diagnosis not present

## 2021-01-15 DIAGNOSIS — I10 Essential (primary) hypertension: Secondary | ICD-10-CM

## 2021-01-15 NOTE — Patient Instructions (Signed)
Medication Instructions:  Your physician recommends that you continue on your current medications as directed. Please refer to the Current Medication list given to you today.   *If you need a refill on your cardiac medications before your next appointment, please call your pharmacy*   Lab Work: -None  If you have labs (blood work) drawn today and your tests are completely normal, you will receive your results only by: MyChart Message (if you have MyChart) OR A paper copy in the mail If you have any lab test that is abnormal or we need to change your treatment, we will call you to review the results.   Testing/Procedures: Keep appointments.    Follow-Up: At [facility], you and your health needs are our priority.  As part of our continuing mission to provide you with exceptional heart care, we have created designated Provider Care Teams.  These Care Teams include your primary Cardiologist (physician) and Advanced Practice Providers (APPs -  Physician Assistants and Nurse Practitioners) who all work together to provide you with the care you need, when you need it.  We recommend signing up for the patient portal called "MyChart".  Sign up information is provided on this After Visit Summary.  MyChart is used to connect with patients for Virtual Visits (Telemedicine).  Patients are able to view lab/test results, encounter notes, upcoming appointments, etc.  Non-urgent messages can be sent to your provider as well.   To learn more about what you can do with MyChart, go to [email or web address].    Your next appointment:   6 month(s)  The format for your next appointment:   In Person  Provider:   [name], MD   Other Instructions Your physician wants you to follow-up in: 6 months with Dr.Cooper.  You will receive a reminder letter in the mail two months in advance. If you don't receive a letter, please call our office to schedule the follow-up appointment.

## 2021-01-20 ENCOUNTER — Ambulatory Visit
Admission: RE | Admit: 2021-01-20 | Discharge: 2021-01-20 | Disposition: A | Discharge status: Home / Self Care | Payer: Medicare Other | Source: Ambulatory Visit | Attending: Pain Medicine | Admitting: Pain Medicine

## 2021-01-20 ENCOUNTER — Other Ambulatory Visit: Payer: Self-pay

## 2021-01-20 DIAGNOSIS — M4802 Spinal stenosis, cervical region: Secondary | ICD-10-CM | POA: Diagnosis present

## 2021-01-20 DIAGNOSIS — M792 Neuralgia and neuritis, unspecified: Secondary | ICD-10-CM | POA: Insufficient documentation

## 2021-01-20 DIAGNOSIS — M542 Cervicalgia: Secondary | ICD-10-CM | POA: Insufficient documentation

## 2021-01-20 DIAGNOSIS — G8929 Other chronic pain: Secondary | ICD-10-CM | POA: Insufficient documentation

## 2021-01-20 DIAGNOSIS — M5412 Radiculopathy, cervical region: Secondary | ICD-10-CM | POA: Diagnosis present

## 2021-01-22 ENCOUNTER — Other Ambulatory Visit: Payer: Self-pay

## 2021-01-22 ENCOUNTER — Ambulatory Visit (HOSPITAL_COMMUNITY): Payer: Medicare Other | Attending: Cardiology

## 2021-01-22 DIAGNOSIS — R011 Cardiac murmur, unspecified: Secondary | ICD-10-CM | POA: Diagnosis not present

## 2021-01-22 LAB — ECHOCARDIOGRAM COMPLETE
Area-P 1/2: 1.91 cm2
P 1/2 time: 402 msec
S' Lateral: 2.8 cm

## 2021-02-10 ENCOUNTER — Telehealth: Payer: Self-pay

## 2021-02-10 DIAGNOSIS — R400 Somnolence: Secondary | ICD-10-CM

## 2021-02-10 NOTE — Telephone Encounter (Signed)
[name] Bollman, MD  02/10/2021 12:41 PM EDT Back to Top     Reviewed echo findings. Worsening pulmonary pressures noted. Recommend overnight sleep study for evaluation of sleep apnea. The patient reports poor sleep and daytime somnolence. Consider pulmonary HTN clinic referral after sleep test results. Discussed with patient over the phone and advised RN willbe in touch regarding sleep apnea testing. thanks    Order placed for split night sleep study.  Message sent to sleep pool and assist.

## 2021-02-17 ENCOUNTER — Encounter: Payer: Self-pay | Admitting: *Deleted

## 2021-02-17 ENCOUNTER — Telehealth: Payer: Self-pay | Admitting: *Deleted

## 2021-02-17 NOTE — Telephone Encounter (Signed)
This encounter was created in error - please disregard.

## 2021-02-17 NOTE — Telephone Encounter (Signed)
Prior Authorization for SPLIT NIGHT sent to [facility] via web portal.   Prior Authorization is not required for the requested services  Decision ID #:[id number] .

## 2021-02-17 NOTE — Telephone Encounter (Signed)
-----   Message from Wiliam Ke, RN sent at 02/10/2021  3:03 PM EDT ----- Regarding: split night sleep study per Dr. Lora Paula, MD  02/10/2021 12:41 PM    Reviewed echo findings. Worsening pulmonary pressures noted. Recommend overnight sleep study for evaluation of sleep apnea. The patient reports poor sleep and daytime somnolence.   Order placed for sleep study  Dierdre Highman

## 2021-02-26 NOTE — Telephone Encounter (Signed)
Patient is scheduled for lab study on 05/02/21. Patient understands his sleep study will be done at [facility] sleep lab. Patient understands he will receive a sleep packet in a week or so. Patient understands to call if he does not receive the sleep packet in a timely manner. Patient agrees with treatment and thanked me for call.
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
## 2021-04-01 NOTE — Patient Instructions (Signed)
____________________________________________________________________________________________  Medication Rules  Purpose: To inform patients, and their family members, of our rules and regulations.  Applies to: All patients receiving prescriptions (written or electronic).  Pharmacy of record: Pharmacy where electronic prescriptions will be sent. If written prescriptions are taken to a different pharmacy, please inform the nursing staff. The pharmacy listed in the electronic medical record should be the one where you would like electronic prescriptions to be sent.  Electronic prescriptions: In compliance with the [place] Strengthen Opioid Misuse Prevention (STOP) Act of 2017 (Session Law 2017-74/H243), effective August 03, 2018, all controlled substances must be electronically prescribed. Calling prescriptions to the pharmacy will cease to exist.  Prescription refills: Only during scheduled appointments. Applies to all prescriptions.  NOTE: The following applies primarily to controlled substances (Opioid* Pain Medications).   Type of encounter (visit): For patients receiving controlled substances, face-to-face visits are required. (Not an option or up to the patient.)  Patient's responsibilities: Pain Pills: Bring all pain pills to every appointment (except for procedure appointments). Pill Bottles: Bring pills in original pharmacy bottle. Always bring the newest bottle. Bring bottle, even if empty. Medication refills: You are responsible for knowing and keeping track of what medications you take and those you need refilled. The day before your appointment: write a list of all prescriptions that need to be refilled. The day of the appointment: give the list to the admitting nurse. Prescriptions will be written only during appointments. No prescriptions will be written on procedure days. If you forget a medication: it will not be "Called in", "Faxed", or "electronically sent". You will  need to get another appointment to get these prescribed. No early refills. Do not call asking to have your prescription filled early. Prescription Accuracy: You are responsible for carefully inspecting your prescriptions before leaving our office. Have the discharge nurse carefully go over each prescription with you, before taking them home. Make sure that your name is accurately spelled, that your address is correct. Check the name and dose of your medication to make sure it is accurate. Check the number of pills, and the written instructions to make sure they are clear and accurate. Make sure that you are given enough medication to last until your next medication refill appointment. Taking Medication: Take medication as prescribed. When it comes to controlled substances, taking less pills or less frequently than prescribed is permitted and encouraged. Never take more pills than instructed. Never take medication more frequently than prescribed.  Inform other Doctors: Always inform, all of your healthcare providers, of all the medications you take. Pain Medication from other Providers: You are not allowed to accept any additional pain medication from any other Doctor or Healthcare provider. There are two exceptions to this rule. (see below) In the event that you require additional pain medication, you are responsible for notifying us, as stated below. Cough Medicine: Often these contain an opioid, such as codeine or hydrocodone. Never accept or take cough medicine containing these opioids if you are already taking an opioid* medication. The combination may cause respiratory failure and death. Medication Agreement: You are responsible for carefully reading and following our Medication Agreement. This must be signed before receiving any prescriptions from our practice. Safely store a copy of your signed Agreement. Violations to the Agreement will result in no further prescriptions. (Additional copies of our  Medication Agreement are available upon request.) Laws, Rules, & Regulations: All patients are expected to follow all Federal and State Laws, Statutes, Rules, & Regulations. Ignorance of   the Laws does not constitute a valid excuse.  Illegal drugs and Controlled Substances: The use of illegal substances (including, but not limited to marijuana and its derivatives) and/or the illegal use of any controlled substances is strictly prohibited. Violation of this rule may result in the immediate and permanent discontinuation of any and all prescriptions being written by our practice. The use of any illegal substances is prohibited. Adopted CDC guidelines & recommendations: Target dosing levels will be at or below 60 MME/day. Use of benzodiazepines** is not recommended.  Exceptions: There are only two exceptions to the rule of not receiving pain medications from other Healthcare Providers. Exception #1 (Emergencies): In the event of an emergency (i.e.: accident requiring emergency care), you are allowed to receive additional pain medication. However, you are responsible for: As soon as you are able, call our office [phone], at any time of the day or night, and leave a message stating your name, the date and nature of the emergency, and the name and dose of the medication prescribed. In the event that your call is answered by a member of our staff, make sure to document and save the date, time, and the name of the person that took your information.  Exception #2 (Planned Surgery): In the event that you are scheduled by another doctor or dentist to have any type of surgery or procedure, you are allowed (for a period no longer than 30 days), to receive additional pain medication, for the acute post-op pain. However, in this case, you are responsible for picking up a copy of our "Post-op Pain Management for Surgeons" handout, and giving it to your surgeon or dentist. This document is available at our office, and  does not require an appointment to obtain it. Simply go to our office during business hours (Monday-Thursday from 8:00 AM to 4:00 PM) (Friday 8:00 AM to 12:00 Noon) or if you have a scheduled appointment with us, prior to your surgery, and ask for it by name. In addition, you are responsible for: calling our office [phone], at any time of the day or night, and leaving a message stating your name, name of your surgeon, type of surgery, and date of procedure or surgery. Failure to comply with your responsibilities may result in termination of therapy involving the controlled substances.  *Opioid medications include: morphine, codeine, oxycodone, oxymorphone, hydrocodone, hydromorphone, meperidine, tramadol, tapentadol, buprenorphine, fentanyl, methadone. **Benzodiazepine medications include: diazepam (Valium), alprazolam (Xanax), clonazepam (Klonopine), lorazepam (Ativan), clorazepate (Tranxene), chlordiazepoxide (Librium), estazolam (Prosom), oxazepam (Serax), temazepam (Restoril), triazolam (Halcion) (Last updated: 07/01/2020) ____________________________________________________________________________________________  ____________________________________________________________________________________________  Medication Recommendations and Reminders  Applies to: All patients receiving prescriptions (written and/or electronic).  Medication Rules & Regulations: These rules and regulations exist for your safety and that of others. They are not flexible and neither are we. Dismissing or ignoring them will be considered "non-compliance" with medication therapy, resulting in complete and irreversible termination of such therapy. (See document titled "Medication Rules" for more details.) In all conscience, because of safety reasons, we cannot continue providing a therapy where the patient does not follow instructions.  Pharmacy of record:  Definition: This is the pharmacy where your electronic  prescriptions will be sent.  We do not endorse any particular pharmacy, however, we have experienced problems with [facility] not securing enough medication supply for the community. We do not restrict you in your choice of pharmacy. However, once we write for your prescriptions, we will NOT be re-sending more prescriptions to fix restricted supply problems   created by your pharmacy, or your insurance.  The pharmacy listed in the electronic medical record should be the one where you want electronic prescriptions to be sent. If you choose to change pharmacy, simply notify our nursing staff.  Recommendations: Keep all of your pain medications in a safe place, under lock and key, even if you live alone. We will NOT replace lost, stolen, or damaged medication. After you fill your prescription, take 1 week's worth of pills and put them away in a safe place. You should keep a separate, properly labeled bottle for this purpose. The remainder should be kept in the original bottle. Use this as your primary supply, until it runs out. Once it's gone, then you know that you have 1 week's worth of medicine, and it is time to come in for a prescription refill. If you do this correctly, it is unlikely that you will ever run out of medicine. To make sure that the above recommendation works, it is very important that you make sure your medication refill appointments are scheduled at least 1 week before you run out of medicine. To do this in an effective manner, make sure that you do not leave the office without scheduling your next medication management appointment. Always ask the nursing staff to show you in your prescription , when your medication will be running out. Then arrange for the receptionist to get you a return appointment, at least 7 days before you run out of medicine. Do not wait until you have 1 or 2 pills left, to come in. This is very poor planning and does not take into consideration that we may need to  cancel appointments due to bad weather, sickness, or emergencies affecting our staff. DO NOT ACCEPT A "Partial Fill": If for any reason your pharmacy does not have enough pills/tablets to completely fill or refill your prescription, do not allow for a "partial fill". The law allows the pharmacy to complete that prescription within 72 hours, without requiring a new prescription. If they do not fill the rest of your prescription within those 72 hours, you will need a separate prescription to fill the remaining amount, which we will NOT provide. If the reason for the partial fill is your insurance, you will need to talk to the pharmacist about payment alternatives for the remaining tablets, but again, DO NOT ACCEPT A PARTIAL FILL, unless you can trust your pharmacist to obtain the remainder of the pills within 72 hours.  Prescription refills and/or changes in medication(s):  Prescription refills, and/or changes in dose or medication, will be conducted only during scheduled medication management appointments. (Applies to both, written and electronic prescriptions.) No refills on procedure days. No medication will be changed or started on procedure days. No changes, adjustments, and/or refills will be conducted on a procedure day. Doing so will interfere with the diagnostic portion of the procedure. No phone refills. No medications will be "called into the pharmacy". No Fax refills. No weekend refills. No Holliday refills. No after hours refills.  Remember:  Business hours are:  Monday to Thursday 8:00 AM to 4:00 PM Provider's Schedule: Shany Marinez, MD - Appointments are:  Medication management: Monday and Wednesday 8:00 AM to 4:00 PM Procedure day: Tuesday and Thursday 7:30 AM to 4:00 PM Bilal Lateef, MD - Appointments are:  Medication management: Tuesday and Thursday 8:00 AM to 4:00 PM Procedure day: Monday and Wednesday 7:30 AM to 4:00 PM (Last update:  02/21/2020) ____________________________________________________________________________________________  ____________________________________________________________________________________________  CBD (cannabidiol) WARNING    Applicable to: All individuals currently taking or considering taking CBD (cannabidiol) and, more important, all patients taking opioid analgesic controlled substances (pain medication). (Example: oxycodone; oxymorphone; hydrocodone; hydromorphone; morphine; methadone; tramadol; tapentadol; fentanyl; buprenorphine; butorphanol; dextromethorphan; meperidine; codeine; etc.)  Legal status: CBD remains a Schedule I drug prohibited for any use. CBD is illegal with one exception. In the [place], CBD has a limited Food and Drug Administration (FDA) approval for the treatment of two specific types of epilepsy disorders. Only one CBD product has been approved by the FDA for this purpose: "Epidiolex". FDA is aware that some companies are marketing products containing cannabis and cannabis-derived compounds in ways that violate the Federal Food, Drug and Cosmetic Act (FD&C Act) and that may put the health and safety of consumers at risk. The FDA, a Federal agency, has not enforced the CBD status since 2018.   Legality: Some manufacturers ship CBD products nationally, which is illegal. Often such products are sold online and are therefore available throughout the country. CBD is openly sold in head shops and health food stores in some states where such sales have not been explicitly legalized. Selling unapproved products with unsubstantiated therapeutic claims is not only a violation of the law, but also can put patients at risk, as these products have not been proven to be safe or effective. Federal illegality makes it difficult to conduct research on CBD.  Reference: "FDA Regulation of Cannabis and Cannabis-Derived Products, Including Cannabidiol (CBD)" -  https://www.fda.gov/news-events/public-health-focus/fda-regulation-cannabis-and-cannabis-derived-products-including-cannabidiol-cbd  Warning: CBD is not FDA approved and has not undergo the same manufacturing controls as prescription drugs.  This means that the purity and safety of available CBD may be questionable. Most of the time, despite manufacturer's claims, it is contaminated with THC (delta-9-tetrahydrocannabinol - the chemical in marijuana responsible for the "HIGH").  When this is the case, the THC contaminant will trigger a positive urine drug screen (UDS) test for Marijuana (carboxy-THC). Because a positive UDS for any illicit substance is a violation of our medication agreement, your opioid analgesics (pain medicine) may be permanently discontinued.  MORE ABOUT CBD  General Information: CBD  is a derivative of the Marijuana (cannabis sativa) plant discovered in 1940. It is one of the 113 identified substances found in Marijuana. It accounts for up to 40% of the plant's extract. As of 2018, preliminary clinical studies on CBD included research for the treatment of anxiety, movement disorders, and pain. CBD is available and consumed in multiple forms, including inhalation of smoke or vapor, as an aerosol spray, and by mouth. It may be supplied as an oil containing CBD, capsules, dried cannabis, or as a liquid solution. CBD is thought not to be as psychoactive as THC (delta-9-tetrahydrocannabinol - the chemical in marijuana responsible for the "HIGH"). Studies suggest that CBD may interact with different biological target receptors in the body, including cannabinoid and other neurotransmitter receptors. As of 2018 the mechanism of action for its biological effects has not been determined.  Side-effects  Adverse reactions: Dry mouth, diarrhea, decreased appetite, fatigue, drowsiness, malaise, weakness, sleep disturbances, and others.  Drug interactions: CBC may interact with other medications  such as blood-thinners. (Last update: 03/09/2020) ____________________________________________________________________________________________  ____________________________________________________________________________________________  Drug Holidays (Slow)  What is a "Drug Holiday"? Drug Holiday: is the name given to the period of time during which a patient stops taking a medication(s) for the purpose of eliminating tolerance to the drug.  Benefits Improved effectiveness of opioids. Decreased opioid dose needed to achieve benefits. Improved pain with lesser dose.    What is tolerance? Tolerance: is the progressive decreased in effectiveness of a drug due to its repetitive use. With repetitive use, the body gets use to the medication and as a consequence, it loses its effectiveness. This is a common problem seen with opioid pain medications. As a result, a larger dose of the drug is needed to achieve the same effect that used to be obtained with a smaller dose.  How long should a "Drug Holiday" last? You should stay off of the pain medicine for at least 14 consecutive days. (2 weeks)  Should I stop the medicine "cold turkey"? No. You should always coordinate with your Pain Specialist so that he/she can provide you with the correct medication dose to make the transition as smoothly as possible.  How do I stop the medicine? Slowly. You will be instructed to decrease the daily amount of pills that you take by one (1) pill every seven (7) days. This is called a "slow downward taper" of your dose. For example: if you normally take four (4) pills per day, you will be asked to drop this dose to three (3) pills per day for seven (7) days, then to two (2) pills per day for seven (7) days, then to one (1) per day for seven (7) days, and at the end of those last seven (7) days, this is when the "Drug Holiday" would start.   Will I have withdrawals? By doing a "slow downward taper" like this one, it  is unlikely that you will experience any significant withdrawal symptoms. Typically, what triggers withdrawals is the sudden stop of a high dose opioid therapy. Withdrawals can usually be avoided by slowly decreasing the dose over a prolonged period of time. If you do not follow these instructions and decide to stop your medication abruptly, withdrawals may be possible.  What are withdrawals? Withdrawals: refers to the wide range of symptoms that occur after stopping or dramatically reducing opiate drugs after heavy and prolonged use. Withdrawal symptoms do not occur to patients that use low dose opioids, or those who take the medication sporadically. Contrary to benzodiazepine (example: Valium, Xanax, etc.) or alcohol withdrawals ("Delirium Tremens"), opioid withdrawals are not lethal. Withdrawals are the physical manifestation of the body getting rid of the excess receptors.  Expected Symptoms Early symptoms of withdrawal may include: Agitation Anxiety Muscle aches Increased tearing Insomnia Runny nose Sweating Yawning  Late symptoms of withdrawal may include: Abdominal cramping Diarrhea Dilated pupils Goose bumps Nausea Vomiting  Will I experience withdrawals? Due to the slow nature of the taper, it is very unlikely that you will experience any.  What is a slow taper? Taper: refers to the gradual decrease in dose.  (Last update: 02/21/2020) ____________________________________________________________________________________________    

## 2021-04-02 ENCOUNTER — Encounter: Payer: Self-pay | Admitting: Pain Medicine

## 2021-04-02 ENCOUNTER — Ambulatory Visit: Payer: Medicare Other | Attending: Pain Medicine | Admitting: Pain Medicine

## 2021-04-02 ENCOUNTER — Other Ambulatory Visit: Payer: Self-pay

## 2021-04-02 VITALS — BP 135/84 | HR 74 | Temp 98.4°F | Resp 16 | Ht 68.0 in | Wt 194.0 lb

## 2021-04-02 DIAGNOSIS — Z79899 Other long term (current) drug therapy: Secondary | ICD-10-CM | POA: Diagnosis present

## 2021-04-02 DIAGNOSIS — M79605 Pain in left leg: Secondary | ICD-10-CM | POA: Insufficient documentation

## 2021-04-02 DIAGNOSIS — Z79891 Long term (current) use of opiate analgesic: Secondary | ICD-10-CM | POA: Diagnosis present

## 2021-04-02 DIAGNOSIS — R937 Abnormal findings on diagnostic imaging of other parts of musculoskeletal system: Secondary | ICD-10-CM | POA: Insufficient documentation

## 2021-04-02 DIAGNOSIS — M47816 Spondylosis without myelopathy or radiculopathy, lumbar region: Secondary | ICD-10-CM | POA: Diagnosis present

## 2021-04-02 DIAGNOSIS — G8929 Other chronic pain: Secondary | ICD-10-CM | POA: Insufficient documentation

## 2021-04-02 DIAGNOSIS — M5030 Other cervical disc degeneration, unspecified cervical region: Secondary | ICD-10-CM | POA: Diagnosis present

## 2021-04-02 DIAGNOSIS — G894 Chronic pain syndrome: Secondary | ICD-10-CM | POA: Insufficient documentation

## 2021-04-02 DIAGNOSIS — M5412 Radiculopathy, cervical region: Secondary | ICD-10-CM | POA: Diagnosis present

## 2021-04-02 DIAGNOSIS — G9589 Other specified diseases of spinal cord: Secondary | ICD-10-CM | POA: Insufficient documentation

## 2021-04-02 DIAGNOSIS — M792 Neuralgia and neuritis, unspecified: Secondary | ICD-10-CM | POA: Insufficient documentation

## 2021-04-02 DIAGNOSIS — M5416 Radiculopathy, lumbar region: Secondary | ICD-10-CM | POA: Diagnosis present

## 2021-04-02 DIAGNOSIS — M1A9XX0 Chronic gout, unspecified, without tophus (tophi): Secondary | ICD-10-CM | POA: Insufficient documentation

## 2021-04-02 DIAGNOSIS — M5442 Lumbago with sciatica, left side: Secondary | ICD-10-CM | POA: Insufficient documentation

## 2021-04-02 DIAGNOSIS — M542 Cervicalgia: Secondary | ICD-10-CM | POA: Insufficient documentation

## 2021-04-02 DIAGNOSIS — M4802 Spinal stenosis, cervical region: Secondary | ICD-10-CM | POA: Diagnosis present

## 2021-04-02 MED ORDER — OXYCODONE HCL 10 MG PO TABS: 10.0000 mg | ORAL_TABLET | Freq: Every day | ORAL | 0 refills | Status: DC

## 2021-04-02 NOTE — Progress Notes (Signed)
Nursing Pain Medication Assessment:  Safety precautions to be maintained throughout the outpatient stay will include: orient to surroundings, keep bed in low position, maintain call bell within reach at all times, provide assistance with transfer out of bed and ambulation.  Medication Inspection Compliance: Pill count conducted under aseptic conditions, in front of the patient. Neither the pills nor the bottle was removed from the patient's sight at any time. Once count was completed pills were immediately returned to the patient in their original bottle.  Medication: Oxycodone IR Pill/Patch Count:  18 of 150 pills remain Pill/Patch Appearance: Markings consistent with prescribed medication Bottle Appearance: Standard pharmacy container. Clearly labeled. Filled Date: 08 / 04 / 2022 Last Medication intake:  Today

## 2021-04-08 LAB — TOXASSURE SELECT 13 (MW), URINE

## 2021-04-25 ENCOUNTER — Inpatient Hospital Stay (HOSPITAL_COMMUNITY): Admission: RE | Admit: 2021-04-25 | Payer: Medicare Other | Source: Ambulatory Visit | Admitting: Internal Medicine

## 2021-05-02 ENCOUNTER — Ambulatory Visit (HOSPITAL_BASED_OUTPATIENT_CLINIC_OR_DEPARTMENT_OTHER): Payer: Medicare Other | Attending: Cardiovascular Disease | Admitting: Cardiology

## 2021-06-13 ENCOUNTER — Other Ambulatory Visit: Payer: Self-pay

## 2021-06-13 ENCOUNTER — Other Ambulatory Visit (HOSPITAL_COMMUNITY): Payer: Self-pay | Admitting: *Deleted

## 2021-06-13 ENCOUNTER — Ambulatory Visit (HOSPITAL_COMMUNITY)
Admission: RE | Admit: 2021-06-13 | Discharge: 2021-06-13 | Disposition: A | Discharge status: Home / Self Care | Payer: Medicare Other | Source: Ambulatory Visit | Attending: Internal Medicine | Admitting: Internal Medicine

## 2021-06-13 ENCOUNTER — Encounter (HOSPITAL_COMMUNITY): Payer: Self-pay | Admitting: Internal Medicine

## 2021-06-13 VITALS — BP 130/78 | HR 69 | Wt 191.2 lb

## 2021-06-13 DIAGNOSIS — G894 Chronic pain syndrome: Secondary | ICD-10-CM | POA: Diagnosis not present

## 2021-06-13 DIAGNOSIS — K219 Gastro-esophageal reflux disease without esophagitis: Secondary | ICD-10-CM | POA: Insufficient documentation

## 2021-06-13 DIAGNOSIS — R400 Somnolence: Secondary | ICD-10-CM | POA: Insufficient documentation

## 2021-06-13 DIAGNOSIS — I5032 Chronic diastolic (congestive) heart failure: Secondary | ICD-10-CM | POA: Diagnosis not present

## 2021-06-13 DIAGNOSIS — Z955 Presence of coronary angioplasty implant and graft: Secondary | ICD-10-CM | POA: Diagnosis not present

## 2021-06-13 DIAGNOSIS — I272 Pulmonary hypertension, unspecified: Secondary | ICD-10-CM

## 2021-06-13 DIAGNOSIS — I2510 Atherosclerotic heart disease of native coronary artery without angina pectoris: Secondary | ICD-10-CM | POA: Diagnosis not present

## 2021-06-13 DIAGNOSIS — I4510 Unspecified right bundle-branch block: Secondary | ICD-10-CM | POA: Insufficient documentation

## 2021-06-13 DIAGNOSIS — Z7982 Long term (current) use of aspirin: Secondary | ICD-10-CM | POA: Insufficient documentation

## 2021-06-13 DIAGNOSIS — Z87891 Personal history of nicotine dependence: Secondary | ICD-10-CM | POA: Insufficient documentation

## 2021-06-13 DIAGNOSIS — I10 Essential (primary) hypertension: Secondary | ICD-10-CM

## 2021-06-13 DIAGNOSIS — Z7902 Long term (current) use of antithrombotics/antiplatelets: Secondary | ICD-10-CM | POA: Insufficient documentation

## 2021-06-13 DIAGNOSIS — R0602 Shortness of breath: Secondary | ICD-10-CM | POA: Diagnosis not present

## 2021-06-13 DIAGNOSIS — Z79899 Other long term (current) drug therapy: Secondary | ICD-10-CM | POA: Diagnosis not present

## 2021-06-13 DIAGNOSIS — E785 Hyperlipidemia, unspecified: Secondary | ICD-10-CM | POA: Diagnosis not present

## 2021-06-13 DIAGNOSIS — Z951 Presence of aortocoronary bypass graft: Secondary | ICD-10-CM | POA: Insufficient documentation

## 2021-06-13 LAB — CBC
HCT: 41.7 % (ref 39.0–52.0)
Hemoglobin: 13.7 g/dL (ref 13.0–17.0)
MCH: 27.9 pg (ref 26.0–34.0)
MCHC: 32.9 g/dL (ref 30.0–36.0)
MCV: 84.9 fL (ref 80.0–100.0)
Platelets: 189 10*3/uL (ref 150–400)
RBC: 4.91 MIL/uL (ref 4.22–5.81)
RDW: 14.1 % (ref 11.5–15.5)
WBC: 4.2 10*3/uL (ref 4.0–10.5)
nRBC: 0 % (ref 0.0–0.2)

## 2021-06-13 LAB — BRAIN NATRIURETIC PEPTIDE: B Natriuretic Peptide: 72.1 pg/mL (ref 0.0–100.0)

## 2021-06-13 LAB — BASIC METABOLIC PANEL
Anion gap: 8 (ref 5–15)
BUN: 10 mg/dL (ref 8–23)
CO2: 28 mmol/L (ref 22–32)
Calcium: 9.2 mg/dL (ref 8.9–10.3)
Chloride: 102 mmol/L (ref 98–111)
Creatinine, Ser: 0.89 mg/dL (ref 0.61–1.24)
GFR, Estimated: >60 mL/min (ref >60)
Glucose, Bld: 104 mg/dL — ABNORMAL HIGH (ref 70–99)
Potassium: 4.2 mmol/L (ref 3.5–5.1)
Sodium: 138 mmol/L (ref 135–145)

## 2021-06-13 MED ORDER — SPIRONOLACTONE 25 MG PO TABS: 12.5000 mg | ORAL_TABLET | Freq: Every day | ORAL | 3 refills | Status: DC

## 2021-06-13 NOTE — Progress Notes (Signed)
ReDS Vest / Clip - 06/13/21 1500       ReDS Vest / Clip   Station Marker C    Ruler Value 26.5    ReDS Value Range Moderate volume overload    ReDS Actual Value 39

## 2021-06-13 NOTE — Patient Instructions (Signed)
Stop Potassium  Start Spironolactone 12.5 mg (1/2 tab) Daily  Labs done today, your results will be available in MyChart, we will contact you for abnormal readings.  Your physician has recommended you have: Pulmonary function test VQ Scan Chest x-ray Heart Catheterization--> 11/16 instructions below  Your physician recommends that you schedule a follow-up appointment in: 1 month  If you have any questions or concerns before your next appointment please send [place] a message through [place] or call our office at [phone].    TO LEAVE A MESSAGE FOR THE NURSE SELECT OPTION 2, PLEASE LEAVE A MESSAGE INCLUDING: YOUR NAME DATE OF BIRTH CALL BACK NUMBER REASON FOR CALL**this is important as we prioritize the call backs  YOU WILL RECEIVE A CALL BACK THE SAME DAY AS LONG AS YOU CALL BEFORE 4:00 PM  At the [facility], you and your health needs are our priority. As part of our continuing mission to provide you with exceptional heart care, we have created designated Provider Care Teams. These Care Teams include your primary Cardiologist (physician) and Advanced Practice Providers (APPs- Physician Assistants and Nurse Practitioners) who all work together to provide you with the care you need, when you need it.   You may see any of the following providers on your designated Care Team at your next follow up: Dr Arvilla Meres Dr Carron Curie, NP Robbie Lis, [place] [facility], [place] Karle Plumber, PharmD   Please be sure to bring in all your medications bottles to every appointment.     CATHETERIZATION INSTRUCTIONS:  You are scheduled for a Cardiac Catheterization on Wednesday, November 16 with Dr. Arvilla Meres.  1. Please arrive at the [facility] (Main Entrance A) at [facility]: [address] [place], [place] [address] at 9:30 AM (This time is two hours before your procedure to ensure your preparation). Free valet parking  service is available.   Special note: Every effort is made to have your procedure done on time. Please understand that emergencies sometimes delay scheduled procedures.  2. Diet: Do not eat solid foods after midnight.  The patient may have clear liquids until 5am upon the day of the procedure.  3. Labs: DONE TODAY  4. Medication instructions in preparation for your procedure:    WED 11/16 AM DO NOT TAKE SPIRONOLACTONE  On the morning of your procedure, take your Aspirin and Plavix/Clopidogrel and any morning medicines NOT listed above.  You may use sips of water.  5. Plan for one night stay--bring personal belongings. 6. Bring a current list of your medications and current insurance cards. 7. You MUST have a responsible person to drive you home. 8. Someone MUST be with you the first 24 hours after you arrive home or your discharge will be delayed. 9. Please wear clothes that are easy to get on and off and wear slip-on shoes.  Thank you for allowing [place] to care for you!   -- [facility]

## 2021-06-13 NOTE — Progress Notes (Signed)
[facility] CONSULT NOTE  Referring Physician: Dr. Burt Knack  Primary Care: Raina Mina., MD Primary Cardiologist: Sherren Mocha, MD   HPI:  [name] [name] is a 70 y.o. male with a hx of CAD S/P CABG with early SVG failure and ultimately required PCI of the LIMA-LAD anastomosis. He's been maintained on long term DAPT.  He also has a history of hyperlipidemia, hypertension, GERD and chronic pain syndrome.   Echocardiogram 01/29/20 EF 55-60% G2DD mildly reduced RVSF, mildly elevated PASP, RVSP 39.6, mild LAE, mild-moderate RAE, mild MR, mild-moderate TR, mild AI  Echo 6/22  EF 60-65% RV moderately dilated mild HK RVSP 56. Given worsening pulmonary pressures, Dr. Burt Knack recommended overnight sleep study to r/o OSA and referred to pulmonary HTN clinic.   Of note, had chest CT in 2008 that showed no PE and no anomalus pulmonary veins.   Presents today for consultation. He lives at home w/ his wife and cares for 2 grandchildren, ages 33 and 78. They live with him full time. He used to walk 4 miles a day but has been less active since his CABG. Does ok w/ ALDs. No dyspnea walking on flat surfaces but SOB ambulating stairs. Denies snoring but reports daytime fatigue/ somnolence. Has not had sleep study yet. Former smoker. Smoked for ~15 years but quit over 40 years ago. Has never been diagnosed w/ COPD/asthma. Denies any h/o DVT/PE. No personal nor family h/o CTD.  EKG shows NSR w/ RBBB, 78 bpm. His BP is controlled 130/78 and usually well controlled at home. Denies any recent angina.   ReDs Clip 39%. Notes occasional LE swelling, though none on exam today. He is on HCTZ for HTN at home. No loop diuretics.     Review of Systems: [y] = yes, [ ]  = no   General: Weight gain [ ] ; Weight loss [ ] ; Anorexia [ ] ; Fatigue [ Y]; Fever [ ] ; Chills [ ] ; Weakness [ ]   Cardiac: Chest pain/pressure [ ] ; Resting SOB [ ] ; Exertional SOB [ Y]; Orthopnea [ ] ; Pedal Edema [Y ]; Palpitations [ ] ;  Syncope [ ] ; Presyncope [ ] ; Paroxysmal nocturnal dyspnea[ ]   Pulmonary: Cough [ ] ; Wheezing[ ] ; Hemoptysis[ ] ; Sputum [ ] ; Snoring [ ]   GI: Vomiting[ ] ; Dysphagia[ ] ; Melena[ ] ; Hematochezia [ ] ; Heartburn[ ] ; Abdominal pain [ ] ; Constipation [ ] ; Diarrhea [ ] ; BRBPR [ ]   GU: Hematuria[ ] ; Dysuria [ ] ; Nocturia[ ]   Vascular: Pain in legs with walking [ ] ; Pain in feet with lying flat [ ] ; Non-healing sores [ ] ; Stroke [ ] ; TIA [ ] ; Slurred speech [ ] ;  Neuro: Headaches[ ] ; Vertigo[ ] ; Seizures[ ] ; Paresthesias[ ] ;Blurred vision [ ] ; Diplopia [ ] ; Vision changes [ ]   Ortho/Skin: Arthritis [ ] ; Joint pain [ ] ; Muscle pain [ ] ; Joint swelling [ ] ; Back Pain [ ] ; Rash [ ]   Psych: Depression[ ] ; Anxiety[ ]   Heme: Bleeding problems [ ] ; Clotting disorders [ ] ; Anemia [ ]   Endocrine: Diabetes [ ] ; Thyroid dysfunction[ ]    Past Medical History:  Diagnosis Date   CAD (coronary artery disease)    CABG   Chronic pain syndrome    GERD (gastroesophageal reflux disease)    Hyperlipidemia    Hypertension    Radiculitis of right cervical region 09/04/2015    Current Outpatient Medications  Medication Sig Dispense Refill   aspirin 81 MG tablet Take 1 tablet (81 mg total) by mouth daily.  atorvastatin (LIPITOR) 20 MG tablet TAKE 1 TABLET BY MOUTH EVERY DAY 90 tablet 3   Cholecalciferol (VITAMIN D3) 50 MCG (2000 UT) capsule Take 1 capsule (2,000 Units total) by mouth daily. 30 capsule 2   clopidogrel (PLAVIX) 75 MG tablet TAKE 1 TABLET BY MOUTH EVERY DAY 90 tablet 3   gabapentin (NEURONTIN) 300 MG capsule Take 300 mg by mouth at bedtime.     hydrochlorothiazide (HYDRODIURIL) 25 MG tablet Take 25 mg by mouth.     isosorbide mononitrate (IMDUR) 30 MG 24 hr tablet TAKE 1 TABLET BY MOUTH EVERY DAY 90 tablet 3   magnesium oxide (MAG-OX) 400 MG tablet Take 1 tablet (400 mg total) by mouth 2 (two) times daily. 60 tablet 2   metoprolol succinate (TOPROL-XL) 50 MG 24 hr tablet TAKE WITH OR IMMEDIATELY  FOLLOWING A MEAL. 90 tablet 1   nitroGLYCERIN (NITROSTAT) 0.4 MG SL tablet PLACE 1 TABLET (0.4 MG TOTAL) UNDER THE TONGUE EVERY 5 (FIVE) MINUTES AS NEEDED FOR CHEST PAIN. 25 tablet 3   Oxycodone HCl 10 MG TABS Take 10 mg by mouth as needed.     pantoprazole (PROTONIX) 40 MG tablet TAKE 1 TABLET BY MOUTH EVERY DAY 90 tablet 3   potassium chloride SA (K-DUR,KLOR-CON) 20 MEQ tablet Take 20 mEq by mouth daily.     vitamin B-12 (CYANOCOBALAMIN) 1000 MCG tablet Take 2,000 mcg by mouth daily.     No current facility-administered medications for this encounter.    No Known Allergies    Social History   Socioeconomic History   Marital status: Married    Spouse name: Not on file   Number of children: Not on file   Years of education: Not on file   Highest education level: Not on file  Occupational History   Not on file  Tobacco Use   Smoking status: Former   Smokeless tobacco: Never  Substance and Sexual Activity   Alcohol use: No    Alcohol/week: 0.0 standard drinks   Drug use: No   Sexual activity: Not on file  Other Topics Concern   Not on file  Social History Narrative   Not on file   Social Determinants of Health   Financial Resource Strain: Not on file  Food Insecurity: Not on file  Transportation Needs: Not on file  Physical Activity: Not on file  Stress: Not on file  Social Connections: Not on file  Intimate Partner Violence: Not on file      Family History  Problem Relation Age of Onset   Stroke Father    Multiple sclerosis Mother     Vitals:   06/13/21 1442  BP: 130/78  Pulse: 69  SpO2: 97%  Weight: 86.7 kg (191 lb 3.2 oz)    PHYSICAL EXAM: ReDs clip 39%  General:  Well appearing. No respiratory difficulty HEENT: normal Neck: supple. no JVD. Carotids 2+ bilat; no bruits. No lymphadenopathy or thryomegaly appreciated. Cor: PMI nondisplaced. Regular rate & rhythm. No rubs, gallops or murmurs. Lungs: clear Abdomen: soft, nontender, nondistended. No  hepatosplenomegaly. No bruits or masses. Good bowel sounds. Extremities: no cyanosis, clubbing, rash, edema Neuro: alert & oriented x 3, cranial nerves grossly intact. moves all 4 extremities w/o difficulty. Affect pleasant.  ECG: NSR w/ RBBB, 78 bpm    ASSESSMENT & PLAN:  1. Pulmonary HTN - Echo 6/21 EF 55-60% G2DD mildly reduced RVSF, mildly elevated PASP, RVSP 39.6 - Echo 6/22: RVSP 56, RV moderately dilated mild HK. LVEF 60-65%. No significant  Left sided valvular dysfunction  - chest CT in 2008 showed no PE and no anomalus pulmonary veins.  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 Will check BNP.  - Start spiro 12.5 mg daily. Stop KCL  - BMP today and in 7 days   2. CAD - h/o CABG w/ early SVG failure, ultimately required PCI of LIMA-LAD anastomosis  - stabe w/o CP  - on ASA, plavix, statin, ? blocker + imdur   3. Hypertension  - controlled on current regimen, 130/78 today  - adding spiro per above   4. HLD  - on statin - LP followed by Dr. Burt Knack    F/u in 4 weeks   Lyda Jester, PA-C  Patient seen and examined with the above-signed Advanced Practice Provider and/or Housestaff. I personally reviewed laboratory data, imaging studies and relevant notes. I independently examined the patient and formulated the important aspects of the plan. I have edited the note to reflect any of my changes or salient points. I have personally discussed the plan with the patient and/or family.  70 y/o male with CAD, HTN and HL. Recent echo with progressive RV dilation and PAH.   Currently with NYHA II-III symptoms. No h/o COPD, DVT/PE or CTD. No comment of anomalous pulmonary venous drainage on previous chest CTA.   General:  Well appearing. No resp difficulty HEENT: normal Neck: supple. JVP 8-9 Carotids 2+ bilat;  no bruits. No lymphadenopathy or thryomegaly appreciated. Cor: PMI nondisplaced. Regular rate & rhythm. No rubs, gallops or murmurs. Lungs: clear Abdomen: soft, nontender, nondistended. No hepatosplenomegaly. No bruits or masses. Good bowel sounds. Extremities: no cyanosis, clubbing, rash, edema Neuro: alert & orientedx3, cranial nerves grossly intact. moves all 4 extremities w/o difficulty. Affect pleasant  Etiology of PAH unclear. Pulmonary pressures and RV dilation seem out of proportion to degree of diastolic dysfunction.  Check RHC, PFTs with DLCO and VQ. Further steps pending results. Will need sleep sudy at next visit. Start spiro to help with mild volume overload.  Glori Bickers, MD  2:24 PM

## 2021-06-18 ENCOUNTER — Encounter (HOSPITAL_COMMUNITY): Admission: RE | Payer: Self-pay | Source: Home / Self Care

## 2021-06-18 ENCOUNTER — Ambulatory Visit (HOSPITAL_COMMUNITY): Admission: RE | Admit: 2021-06-18 | Payer: Medicare Other | Source: Home / Self Care | Admitting: Internal Medicine

## 2021-06-18 SURGERY — RIGHT HEART CATH
Anesthesia: LOCAL

## 2021-06-23 ENCOUNTER — Telehealth (HOSPITAL_COMMUNITY): Payer: Self-pay | Admitting: *Deleted

## 2021-06-24 ENCOUNTER — Other Ambulatory Visit (HOSPITAL_COMMUNITY): Payer: Self-pay | Admitting: *Deleted

## 2021-06-24 DIAGNOSIS — I272 Pulmonary hypertension, unspecified: Secondary | ICD-10-CM

## 2021-06-24 NOTE — Addendum Note (Signed)
Addended by: Noralee Space on: 06/24/2021 10:41 AM   Modules accepted: Orders

## 2021-06-27 NOTE — Progress Notes (Signed)
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 Office Visit Milinda Pointer, MD [facility]  Showing recent visits within past 90 days and meeting all other requirements Today's Visits Date Type Provider Dept  06/30/21 Office Visit Milinda Pointer, MD [facility]  Showing today's visits and meeting all other requirements Future Appointments No visits were found meeting these conditions. Showing future appointments within next 90 days and meeting all other requirements I discussed the assessment and treatment plan with the patient. The patient was provided an opportunity to ask questions and all were answered. The patient agreed with the plan and demonstrated an understanding of the instructions.  Patient advised to call back or seek an in-person evaluation if the symptoms or condition worsens.  Duration of encounter: 30 minutes.  Note by: Gaspar Cola, MD Date: 06/30/2021; Time: 12:22 PM

## 2021-06-30 ENCOUNTER — Ambulatory Visit: Payer: Medicare Other | Attending: Pain Medicine | Admitting: Pain Medicine

## 2021-06-30 ENCOUNTER — Other Ambulatory Visit: Payer: Self-pay

## 2021-06-30 ENCOUNTER — Telehealth: Payer: Self-pay

## 2021-06-30 ENCOUNTER — Encounter: Payer: Self-pay | Admitting: Pain Medicine

## 2021-06-30 VITALS — BP 141/78 | HR 79 | Temp 97.4°F | Resp 16 | Ht 68.0 in | Wt 191.0 lb

## 2021-06-30 DIAGNOSIS — M5137 Other intervertebral disc degeneration, lumbosacral region: Secondary | ICD-10-CM | POA: Insufficient documentation

## 2021-06-30 DIAGNOSIS — M79602 Pain in left arm: Secondary | ICD-10-CM | POA: Diagnosis present

## 2021-06-30 DIAGNOSIS — M5442 Lumbago with sciatica, left side: Secondary | ICD-10-CM | POA: Diagnosis present

## 2021-06-30 DIAGNOSIS — M542 Cervicalgia: Secondary | ICD-10-CM | POA: Diagnosis present

## 2021-06-30 DIAGNOSIS — M47816 Spondylosis without myelopathy or radiculopathy, lumbar region: Secondary | ICD-10-CM | POA: Diagnosis present

## 2021-06-30 DIAGNOSIS — G894 Chronic pain syndrome: Secondary | ICD-10-CM | POA: Diagnosis present

## 2021-06-30 DIAGNOSIS — M79605 Pain in left leg: Secondary | ICD-10-CM | POA: Diagnosis present

## 2021-06-30 DIAGNOSIS — G8929 Other chronic pain: Secondary | ICD-10-CM | POA: Insufficient documentation

## 2021-06-30 DIAGNOSIS — M5030 Other cervical disc degeneration, unspecified cervical region: Secondary | ICD-10-CM | POA: Diagnosis present

## 2021-06-30 DIAGNOSIS — Z79899 Other long term (current) drug therapy: Secondary | ICD-10-CM | POA: Insufficient documentation

## 2021-06-30 DIAGNOSIS — M79601 Pain in right arm: Secondary | ICD-10-CM | POA: Diagnosis present

## 2021-06-30 DIAGNOSIS — Z79891 Long term (current) use of opiate analgesic: Secondary | ICD-10-CM | POA: Insufficient documentation

## 2021-06-30 MED ORDER — OXYCODONE HCL 10 MG PO TABS: 10.0000 mg | ORAL_TABLET | Freq: Every day | ORAL | 0 refills | Status: DC

## 2021-06-30 NOTE — Progress Notes (Signed)
Nursing Pain Medication Assessment:  Safety precautions to be maintained throughout the outpatient stay will include: orient to surroundings, keep bed in low position, maintain call bell within reach at all times, provide assistance with transfer out of bed and ambulation.  Medication Inspection Compliance: Pill count conducted under aseptic conditions, in front of the patient. Neither the pills nor the bottle was removed from the patient's sight at any time. Once count was completed pills were immediately returned to the patient in their original bottle.  Medication: Oxycodone IR Pill/Patch Count:  36 of 150 pills remain Pill/Patch Appearance: Markings consistent with prescribed medication Bottle Appearance: Standard pharmacy container. Clearly labeled. Filled Date: 22 / 2 / 2022 Last Medication intake:  Today

## 2021-06-30 NOTE — Telephone Encounter (Signed)
Letter has been sent to patient instructing them to call us if they are still interested in completing their sleep study. If we have not received a response from the patient within 30 days of this notice, the order will be cancelled and they will need to discuss the need for a sleep study at their next office visit.  ° °

## 2021-07-01 ENCOUNTER — Ambulatory Visit (HOSPITAL_COMMUNITY): Payer: Medicare Other

## 2021-07-09 ENCOUNTER — Ambulatory Visit (HOSPITAL_COMMUNITY)
Admission: RE | Admit: 2021-07-09 | Discharge: 2021-07-09 | Disposition: A | Discharge status: Home / Self Care | Payer: Medicare Other | Source: Ambulatory Visit | Attending: Internal Medicine | Admitting: Internal Medicine

## 2021-07-09 ENCOUNTER — Other Ambulatory Visit: Payer: Self-pay

## 2021-07-09 DIAGNOSIS — I272 Pulmonary hypertension, unspecified: Secondary | ICD-10-CM

## 2021-07-22 ENCOUNTER — Encounter (HOSPITAL_COMMUNITY): Payer: Medicare Other | Admitting: Internal Medicine

## 2021-07-30 NOTE — Telephone Encounter (Signed)
Sleep study order deleted. Letter was previously mailed to pt 30 days ago. Pt can call back to scheduled sleep study.

## 2021-08-01 ENCOUNTER — Encounter (HOSPITAL_COMMUNITY): Payer: Medicare Other

## 2021-08-11 ENCOUNTER — Other Ambulatory Visit: Payer: Self-pay | Admitting: Internal Medicine

## 2021-08-12 LAB — SARS CORONAVIRUS 2 (TAT 6-24 HRS): SARS Coronavirus 2: NEGATIVE

## 2021-08-14 ENCOUNTER — Other Ambulatory Visit: Payer: Self-pay

## 2021-08-14 ENCOUNTER — Ambulatory Visit (HOSPITAL_COMMUNITY)
Admission: RE | Admit: 2021-08-14 | Discharge: 2021-08-14 | Disposition: A | Discharge status: Home / Self Care | Payer: Medicare Other | Source: Ambulatory Visit | Attending: Internal Medicine | Admitting: Internal Medicine

## 2021-08-14 DIAGNOSIS — I272 Pulmonary hypertension, unspecified: Secondary | ICD-10-CM | POA: Insufficient documentation

## 2021-08-14 LAB — PULMONARY FUNCTION TEST
DL/VA % pred: 97 %
DL/VA: 4 ml/min/mmHg/L
DLCO unc % pred: 99 %
DLCO unc: 23.5 ml/min/mmHg
FEF 25-75 Post: 1.83 L/sec
FEF 25-75 Pre: 1.04 L/sec
FEF2575-%Change-Post: 75 %
FEF2575-%Pred-Post: 82 %
FEF2575-%Pred-Pre: 47 %
FEV1-%Change-Post: 5 %
FEV1-%Pred-Post: 103 %
FEV1-%Pred-Pre: 98 %
FEV1-Post: 2.63 L
FEV1-Pre: 2.5 L
FEV1FVC-%Change-Post: 10 %
FEV1FVC-%Pred-Pre: 90 %
FEV6-%Change-Post: 1 %
FEV6-%Pred-Post: 103 %
FEV6-%Pred-Pre: 102 %
FEV6-Post: 3.35 L
FEV6-Pre: 3.31 L
FEV6FVC-%Change-Post: 7 %
FEV6FVC-%Pred-Post: 104 %
FEV6FVC-%Pred-Pre: 96 %
FVC-%Change-Post: -4 %
FVC-%Pred-Post: 101 %
FVC-%Pred-Pre: 106 %
FVC-Post: 3.43 L
FVC-Pre: 3.6 L
Post FEV1/FVC ratio: 77 %
Post FEV6/FVC ratio: 99 %
Pre FEV1/FVC ratio: 70 %
Pre FEV6/FVC Ratio: 92 %
RV % pred: 119 %
RV: 2.73 L
TLC % pred: 97 %
TLC: 6.29 L

## 2021-08-14 MED ORDER — ALBUTEROL SULFATE (2.5 MG/3ML) 0.083% IN NEBU
2.5000 mg | INHALATION_SOLUTION | Freq: Once | RESPIRATORY_TRACT | Status: AC
  Administered 2021-08-14: 2.5 mg via RESPIRATORY_TRACT

## 2021-09-03 ENCOUNTER — Other Ambulatory Visit: Payer: Self-pay | Admitting: Cardiovascular Disease

## 2021-09-03 DIAGNOSIS — E785 Hyperlipidemia, unspecified: Secondary | ICD-10-CM

## 2021-09-03 DIAGNOSIS — I10 Essential (primary) hypertension: Secondary | ICD-10-CM

## 2021-09-09 ENCOUNTER — Other Ambulatory Visit (HOSPITAL_COMMUNITY): Payer: Self-pay | Admitting: Internal Medicine
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
## 2021-10-01 ENCOUNTER — Ambulatory Visit: Payer: Medicare Other | Attending: Pain Medicine | Admitting: Pain Medicine

## 2021-10-01 ENCOUNTER — Other Ambulatory Visit: Payer: Self-pay

## 2021-10-01 ENCOUNTER — Encounter: Payer: Self-pay | Admitting: Pain Medicine

## 2021-10-01 VITALS — BP 142/86 | HR 85 | Temp 97.1°F | Ht 68.0 in | Wt 191.0 lb

## 2021-10-01 DIAGNOSIS — M79602 Pain in left arm: Secondary | ICD-10-CM | POA: Insufficient documentation

## 2021-10-01 DIAGNOSIS — M5137 Other intervertebral disc degeneration, lumbosacral region: Secondary | ICD-10-CM | POA: Diagnosis present

## 2021-10-01 DIAGNOSIS — Z79899 Other long term (current) drug therapy: Secondary | ICD-10-CM | POA: Insufficient documentation

## 2021-10-01 DIAGNOSIS — M47816 Spondylosis without myelopathy or radiculopathy, lumbar region: Secondary | ICD-10-CM | POA: Insufficient documentation

## 2021-10-01 DIAGNOSIS — M5442 Lumbago with sciatica, left side: Secondary | ICD-10-CM | POA: Diagnosis present

## 2021-10-01 DIAGNOSIS — M79601 Pain in right arm: Secondary | ICD-10-CM | POA: Diagnosis present

## 2021-10-01 DIAGNOSIS — M5030 Other cervical disc degeneration, unspecified cervical region: Secondary | ICD-10-CM | POA: Diagnosis present

## 2021-10-01 DIAGNOSIS — M542 Cervicalgia: Secondary | ICD-10-CM | POA: Insufficient documentation

## 2021-10-01 DIAGNOSIS — G894 Chronic pain syndrome: Secondary | ICD-10-CM | POA: Insufficient documentation

## 2021-10-01 DIAGNOSIS — G8929 Other chronic pain: Secondary | ICD-10-CM | POA: Diagnosis present

## 2021-10-01 DIAGNOSIS — M79605 Pain in left leg: Secondary | ICD-10-CM | POA: Diagnosis present

## 2021-10-01 DIAGNOSIS — Z79891 Long term (current) use of opiate analgesic: Secondary | ICD-10-CM | POA: Diagnosis present

## 2021-10-01 MED ORDER — OXYCODONE HCL 10 MG PO TABS: 10.0000 mg | ORAL_TABLET | Freq: Every day | ORAL | 0 refills | Status: DC

## 2021-10-01 NOTE — Patient Instructions (Signed)
____________________________________________________________________________________________ ° °Medication Rules ° °Purpose: To inform patients, and their family members, of our rules and regulations. ° °Applies to: All patients receiving prescriptions (written or electronic). ° °Pharmacy of record: Pharmacy where electronic prescriptions will be sent. If written prescriptions are taken to a different pharmacy, please inform the nursing staff. The pharmacy listed in the electronic medical record should be the one where you would like electronic prescriptions to be sent. ° °Electronic prescriptions: In compliance with the [place] Strengthen Opioid Misuse Prevention (STOP) Act of 2017 (Session Law 2017-74/H243), effective August 03, 2018, all controlled substances must be electronically prescribed. Calling prescriptions to the pharmacy will cease to exist. ° °Prescription refills: Only during scheduled appointments. Applies to all prescriptions. ° °NOTE: The following applies primarily to controlled substances (Opioid* Pain Medications).  ° °Type of encounter (visit): For patients receiving controlled substances, face-to-face visits are required. (Not an option or up to the patient.) ° °Patient's responsibilities: °Pain Pills: Bring all pain pills to every appointment (except for procedure appointments). °Pill Bottles: Bring pills in original pharmacy bottle. Always bring the newest bottle. Bring bottle, even if empty. °Medication refills: You are responsible for knowing and keeping track of what medications you take and those you need refilled. °The day before your appointment: write a list of all prescriptions that need to be refilled. °The day of the appointment: give the list to the admitting nurse. Prescriptions will be written only during appointments. No prescriptions will be written on procedure days. °If you forget a medication: it will not be "Called in", "Faxed", or "electronically sent". You will  need to get another appointment to get these prescribed. °No early refills. Do not call asking to have your prescription filled early. °Prescription Accuracy: You are responsible for carefully inspecting your prescriptions before leaving our office. Have the discharge nurse carefully go over each prescription with you, before taking them home. Make sure that your name is accurately spelled, that your address is correct. Check the name and dose of your medication to make sure it is accurate. Check the number of pills, and the written instructions to make sure they are clear and accurate. Make sure that you are given enough medication to last until your next medication refill appointment. °Taking Medication: Take medication as prescribed. When it comes to controlled substances, taking less pills or less frequently than prescribed is permitted and encouraged. °Never take more pills than instructed. °Never take medication more frequently than prescribed.  °Inform other Doctors: Always inform, all of your healthcare providers, of all the medications you take. °Pain Medication from other Providers: You are not allowed to accept any additional pain medication from any other Doctor or Healthcare provider. There are two exceptions to this rule. (see below) In the event that you require additional pain medication, you are responsible for notifying us, as stated below. °Cough Medicine: Often these contain an opioid, such as codeine or hydrocodone. Never accept or take cough medicine containing these opioids if you are already taking an opioid* medication. The combination may cause respiratory failure and death. °Medication Agreement: You are responsible for carefully reading and following our Medication Agreement. This must be signed before receiving any prescriptions from our practice. Safely store a copy of your signed Agreement. Violations to the Agreement will result in no further prescriptions. (Additional copies of our  Medication Agreement are available upon request.) °Laws, Rules, & Regulations: All patients are expected to follow all Federal and State Laws, Statutes, Rules, & Regulations. Ignorance of   the Laws does not constitute a valid excuse.  °Illegal drugs and Controlled Substances: The use of illegal substances (including, but not limited to marijuana and its derivatives) and/or the illegal use of any controlled substances is strictly prohibited. Violation of this rule may result in the immediate and permanent discontinuation of any and all prescriptions being written by our practice. The use of any illegal substances is prohibited. °Adopted CDC guidelines & recommendations: Target dosing levels will be at or below 60 MME/day. Use of benzodiazepines** is not recommended. ° °Exceptions: There are only two exceptions to the rule of not receiving pain medications from other Healthcare Providers. °Exception #1 (Emergencies): In the event of an emergency (i.e.: accident requiring emergency care), you are allowed to receive additional pain medication. However, you are responsible for: As soon as you are able, call our office [phone], at any time of the day or night, and leave a message stating your name, the date and nature of the emergency, and the name and dose of the medication prescribed. In the event that your call is answered by a member of our staff, make sure to document and save the date, time, and the name of the person that took your information.  °Exception #2 (Planned Surgery): In the event that you are scheduled by another doctor or dentist to have any type of surgery or procedure, you are allowed (for a period no longer than 30 days), to receive additional pain medication, for the acute post-op pain. However, in this case, you are responsible for picking up a copy of our "Post-op Pain Management for Surgeons" handout, and giving it to your surgeon or dentist. This document is available at our office, and  does not require an appointment to obtain it. Simply go to our office during business hours (Monday-Thursday from 8:00 AM to 4:00 PM) (Friday 8:00 AM to 12:00 Noon) or if you have a scheduled appointment with us, prior to your surgery, and ask for it by name. In addition, you are responsible for: calling our office [phone], at any time of the day or night, and leaving a message stating your name, name of your surgeon, type of surgery, and date of procedure or surgery. Failure to comply with your responsibilities may result in termination of therapy involving the controlled substances. °Medication Agreement Violation. Following the above rules, including your responsibilities will help you in avoiding a Medication Agreement Violation (“Breaking your Pain Medication Contract”). ° °*Opioid medications include: morphine, codeine, oxycodone, oxymorphone, hydrocodone, hydromorphone, meperidine, tramadol, tapentadol, buprenorphine, fentanyl, methadone. °**Benzodiazepine medications include: diazepam (Valium), alprazolam (Xanax), clonazepam (Klonopine), lorazepam (Ativan), clorazepate (Tranxene), chlordiazepoxide (Librium), estazolam (Prosom), oxazepam (Serax), temazepam (Restoril), triazolam (Halcion) °(Last updated: 04/30/2021) °____________________________________________________________________________________________ ° ____________________________________________________________________________________________ ° °Medication Recommendations and Reminders ° °Applies to: All patients receiving prescriptions (written and/or electronic). ° °Medication Rules & Regulations: These rules and regulations exist for your safety and that of others. They are not flexible and neither are we. Dismissing or ignoring them will be considered "non-compliance" with medication therapy, resulting in complete and irreversible termination of such therapy. (See document titled "Medication Rules" for more details.) In all conscience,  because of safety reasons, we cannot continue providing a therapy where the patient does not follow instructions. ° °Pharmacy of record:  °Definition: This is the pharmacy where your electronic prescriptions will be sent.  °We do not endorse any particular pharmacy, however, we have experienced problems with [facility] not securing enough medication supply for the community. °We do not restrict you   in your choice of pharmacy. However, once we write for your prescriptions, we will NOT be re-sending more prescriptions to fix restricted supply problems created by your pharmacy, or your insurance.  °The pharmacy listed in the electronic medical record should be the one where you want electronic prescriptions to be sent. °If you choose to change pharmacy, simply notify our nursing staff. ° °Recommendations: °Keep all of your pain medications in a safe place, under lock and key, even if you live alone. We will NOT replace lost, stolen, or damaged medication. °After you fill your prescription, take 1 week's worth of pills and put them away in a safe place. You should keep a separate, properly labeled bottle for this purpose. The remainder should be kept in the original bottle. Use this as your primary supply, until it runs out. Once it's gone, then you know that you have 1 week's worth of medicine, and it is time to come in for a prescription refill. If you do this correctly, it is unlikely that you will ever run out of medicine. °To make sure that the above recommendation works, it is very important that you make sure your medication refill appointments are scheduled at least 1 week before you run out of medicine. To do this in an effective manner, make sure that you do not leave the office without scheduling your next medication management appointment. Always ask the nursing staff to show you in your prescription , when your medication will be running out. Then arrange for the receptionist to get you a return appointment,  at least 7 days before you run out of medicine. Do not wait until you have 1 or 2 pills left, to come in. This is very poor planning and does not take into consideration that we may need to cancel appointments due to bad weather, sickness, or emergencies affecting our staff. °DO NOT ACCEPT A "Partial Fill": If for any reason your pharmacy does not have enough pills/tablets to completely fill or refill your prescription, do not allow for a "partial fill". The law allows the pharmacy to complete that prescription within 72 hours, without requiring a new prescription. If they do not fill the rest of your prescription within those 72 hours, you will need a separate prescription to fill the remaining amount, which we will NOT provide. If the reason for the partial fill is your insurance, you will need to talk to the pharmacist about payment alternatives for the remaining tablets, but again, DO NOT ACCEPT A PARTIAL FILL, unless you can trust your pharmacist to obtain the remainder of the pills within 72 hours. ° °Prescription refills and/or changes in medication(s):  °Prescription refills, and/or changes in dose or medication, will be conducted only during scheduled medication management appointments. (Applies to both, written and electronic prescriptions.) °No refills on procedure days. No medication will be changed or started on procedure days. No changes, adjustments, and/or refills will be conducted on a procedure day. Doing so will interfere with the diagnostic portion of the procedure. °No phone refills. No medications will be "called into the pharmacy". °No Fax refills. °No weekend refills. °No Holliday refills. °No after hours refills. ° °Remember:  °Business hours are:  °Monday to Thursday 8:00 AM to 4:00 PM °Provider's Schedule: °Devani Odonnel, MD - Appointments are:  °Medication management: Monday and Wednesday 8:00 AM to 4:00 PM °Procedure day: Tuesday and Thursday 7:30 AM to 4:00 PM °Bilal Lateef, MD -  Appointments are:  °Medication management: Tuesday and Thursday 8:00   AM to 4:00 PM °Procedure day: Monday and Wednesday 7:30 AM to 4:00 PM °(Last update: 02/21/2020) °____________________________________________________________________________________________ °  °

## 2021-10-01 NOTE — Progress Notes (Signed)
Nursing Pain Medication Assessment:  ?Safety precautions to be maintained throughout the outpatient stay will include: orient to surroundings, keep bed in low position, maintain call bell within reach at all times, provide assistance with transfer out of bed and ambulation.  ?Medication Inspection Compliance: Pill count conducted under aseptic conditions, in front of the patient. Neither the pills nor the bottle was removed from the patient's sight at any time. Once count was completed pills were immediately returned to the patient in their original bottle. ? ?Medication: Oxycodone IR ?Pill/Patch Count:  15 of 150 pills remain ?Pill/Patch Appearance: Markings consistent with prescribed medication ?Bottle Appearance: Standard pharmacy container. Clearly labeled. ?Filled Date: 2 / 1 / 2023 ?Last Medication intake:  Today ?

## 2021-11-04 ENCOUNTER — Ambulatory Visit: Payer: Medicare Other | Admitting: Cardiovascular Disease
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
## 2021-12-31 ENCOUNTER — Encounter: Payer: Self-pay | Admitting: Pain Medicine

## 2021-12-31 ENCOUNTER — Ambulatory Visit: Payer: Medicare Other | Attending: Pain Medicine | Admitting: Pain Medicine

## 2021-12-31 VITALS — BP 124/71 | HR 63 | Temp 97.2°F | Resp 18 | Ht 68.0 in | Wt 191.0 lb

## 2021-12-31 DIAGNOSIS — G894 Chronic pain syndrome: Secondary | ICD-10-CM | POA: Diagnosis present

## 2021-12-31 DIAGNOSIS — M79601 Pain in right arm: Secondary | ICD-10-CM | POA: Insufficient documentation

## 2021-12-31 DIAGNOSIS — M47816 Spondylosis without myelopathy or radiculopathy, lumbar region: Secondary | ICD-10-CM | POA: Insufficient documentation

## 2021-12-31 DIAGNOSIS — Z79891 Long term (current) use of opiate analgesic: Secondary | ICD-10-CM | POA: Diagnosis present

## 2021-12-31 DIAGNOSIS — M542 Cervicalgia: Secondary | ICD-10-CM | POA: Insufficient documentation

## 2021-12-31 DIAGNOSIS — M5137 Other intervertebral disc degeneration, lumbosacral region: Secondary | ICD-10-CM | POA: Insufficient documentation

## 2021-12-31 DIAGNOSIS — G8929 Other chronic pain: Secondary | ICD-10-CM | POA: Insufficient documentation

## 2021-12-31 DIAGNOSIS — M79605 Pain in left leg: Secondary | ICD-10-CM | POA: Insufficient documentation

## 2021-12-31 DIAGNOSIS — M79602 Pain in left arm: Secondary | ICD-10-CM | POA: Diagnosis present

## 2021-12-31 DIAGNOSIS — M5442 Lumbago with sciatica, left side: Secondary | ICD-10-CM | POA: Diagnosis not present

## 2021-12-31 DIAGNOSIS — M5030 Other cervical disc degeneration, unspecified cervical region: Secondary | ICD-10-CM | POA: Insufficient documentation

## 2021-12-31 DIAGNOSIS — Z79899 Other long term (current) drug therapy: Secondary | ICD-10-CM | POA: Insufficient documentation

## 2021-12-31 MED ORDER — NALOXONE HCL 4 MG/0.1ML NA LIQD: 1.0000 | NASAL | 0 refills | Status: AC | PRN

## 2021-12-31 MED ORDER — OXYCODONE HCL 10 MG PO TABS: 10.0000 mg | ORAL_TABLET | Freq: Every day | ORAL | 0 refills | Status: DC

## 2021-12-31 NOTE — Patient Instructions (Signed)
____________________________________________________________________________________________  Medication Rules  Purpose: To inform patients, and their family members, of our rules and regulations.  Applies to: All patients receiving prescriptions (written or electronic).  Pharmacy of record: Pharmacy where electronic prescriptions will be sent. If written prescriptions are taken to a different pharmacy, please inform the nursing staff. The pharmacy listed in the electronic medical record should be the one where you would like electronic prescriptions to be sent.  Electronic prescriptions: In compliance with the [place] Strengthen Opioid Misuse Prevention (STOP) Act of 2017 (Session Law 2017-74/H243), effective August 03, 2018, all controlled substances must be electronically prescribed. Calling prescriptions to the pharmacy will cease to exist.  Prescription refills: Only during scheduled appointments. Applies to all prescriptions.  NOTE: The following applies primarily to controlled substances (Opioid* Pain Medications).   Type of encounter (visit): For patients receiving controlled substances, face-to-face visits are required. (Not an option or up to the patient.)  Patient's responsibilities: Pain Pills: Bring all pain pills to every appointment (except for procedure appointments). Pill Bottles: Bring pills in original pharmacy bottle. Always bring the newest bottle. Bring bottle, even if empty. Medication refills: You are responsible for knowing and keeping track of what medications you take and those you need refilled. The day before your appointment: write a list of all prescriptions that need to be refilled. The day of the appointment: give the list to the admitting nurse. Prescriptions will be written only during appointments. No prescriptions will be written on procedure days. If you forget a medication: it will not be "Called in", "Faxed", or "electronically sent". You will  need to get another appointment to get these prescribed. No early refills. Do not call asking to have your prescription filled early. Prescription Accuracy: You are responsible for carefully inspecting your prescriptions before leaving our office. Have the discharge nurse carefully go over each prescription with you, before taking them home. Make sure that your name is accurately spelled, that your address is correct. Check the name and dose of your medication to make sure it is accurate. Check the number of pills, and the written instructions to make sure they are clear and accurate. Make sure that you are given enough medication to last until your next medication refill appointment. Taking Medication: Take medication as prescribed. When it comes to controlled substances, taking less pills or less frequently than prescribed is permitted and encouraged. Never take more pills than instructed. Never take medication more frequently than prescribed.  Inform other Doctors: Always inform, all of your healthcare providers, of all the medications you take. Pain Medication from other Providers: You are not allowed to accept any additional pain medication from any other Doctor or Healthcare provider. There are two exceptions to this rule. (see below) In the event that you require additional pain medication, you are responsible for notifying us, as stated below. Cough Medicine: Often these contain an opioid, such as codeine or hydrocodone. Never accept or take cough medicine containing these opioids if you are already taking an opioid* medication. The combination may cause respiratory failure and death. Medication Agreement: You are responsible for carefully reading and following our Medication Agreement. This must be signed before receiving any prescriptions from our practice. Safely store a copy of your signed Agreement. Violations to the Agreement will result in no further prescriptions. (Additional copies of our  Medication Agreement are available upon request.) Laws, Rules, & Regulations: All patients are expected to follow all Federal and State Laws, Statutes, Rules, & Regulations. Ignorance of   the Laws does not constitute a valid excuse.  Illegal drugs and Controlled Substances: The use of illegal substances (including, but not limited to marijuana and its derivatives) and/or the illegal use of any controlled substances is strictly prohibited. Violation of this rule may result in the immediate and permanent discontinuation of any and all prescriptions being written by our practice. The use of any illegal substances is prohibited. Adopted CDC guidelines & recommendations: Target dosing levels will be at or below 60 MME/day. Use of benzodiazepines** is not recommended.  Exceptions: There are only two exceptions to the rule of not receiving pain medications from other Healthcare Providers. Exception #1 (Emergencies): In the event of an emergency (i.e.: accident requiring emergency care), you are allowed to receive additional pain medication. However, you are responsible for: As soon as you are able, call our office [phone], at any time of the day or night, and leave a message stating your name, the date and nature of the emergency, and the name and dose of the medication prescribed. In the event that your call is answered by a member of our staff, make sure to document and save the date, time, and the name of the person that took your information.  Exception #2 (Planned Surgery): In the event that you are scheduled by another doctor or dentist to have any type of surgery or procedure, you are allowed (for a period no longer than 30 days), to receive additional pain medication, for the acute post-op pain. However, in this case, you are responsible for picking up a copy of our "Post-op Pain Management for Surgeons" handout, and giving it to your surgeon or dentist. This document is available at our office, and  does not require an appointment to obtain it. Simply go to our office during business hours (Monday-Thursday from 8:00 AM to 4:00 PM) (Friday 8:00 AM to 12:00 Noon) or if you have a scheduled appointment with us, prior to your surgery, and ask for it by name. In addition, you are responsible for: calling our office [phone], at any time of the day or night, and leaving a message stating your name, name of your surgeon, type of surgery, and date of procedure or surgery. Failure to comply with your responsibilities may result in termination of therapy involving the controlled substances. Medication Agreement Violation. Following the above rules, including your responsibilities will help you in avoiding a Medication Agreement Violation ("Breaking your Pain Medication Contract").  *Opioid medications include: morphine, codeine, oxycodone, oxymorphone, hydrocodone, hydromorphone, meperidine, tramadol, tapentadol, buprenorphine, fentanyl, methadone. **Benzodiazepine medications include: diazepam (Valium), alprazolam (Xanax), clonazepam (Klonopine), lorazepam (Ativan), clorazepate (Tranxene), chlordiazepoxide (Librium), estazolam (Prosom), oxazepam (Serax), temazepam (Restoril), triazolam (Halcion) (Last updated: 04/30/2021) ____________________________________________________________________________________________  ____________________________________________________________________________________________  Medication Recommendations and Reminders  Applies to: All patients receiving prescriptions (written and/or electronic).  Medication Rules & Regulations: These rules and regulations exist for your safety and that of others. They are not flexible and neither are we. Dismissing or ignoring them will be considered "non-compliance" with medication therapy, resulting in complete and irreversible termination of such therapy. (See document titled "Medication Rules" for more details.) In all conscience,  because of safety reasons, we cannot continue providing a therapy where the patient does not follow instructions.  Pharmacy of record:  Definition: This is the pharmacy where your electronic prescriptions will be sent.  We do not endorse any particular pharmacy, however, we have experienced problems with [facility] not securing enough medication supply for the community. We do not restrict you   in your choice of pharmacy. However, once we write for your prescriptions, we will NOT be re-sending more prescriptions to fix restricted supply problems created by your pharmacy, or your insurance.  The pharmacy listed in the electronic medical record should be the one where you want electronic prescriptions to be sent. If you choose to change pharmacy, simply notify our nursing staff.  Recommendations: Keep all of your pain medications in a safe place, under lock and key, even if you live alone. We will NOT replace lost, stolen, or damaged medication. After you fill your prescription, take 1 week's worth of pills and put them away in a safe place. You should keep a separate, properly labeled bottle for this purpose. The remainder should be kept in the original bottle. Use this as your primary supply, until it runs out. Once it's gone, then you know that you have 1 week's worth of medicine, and it is time to come in for a prescription refill. If you do this correctly, it is unlikely that you will ever run out of medicine. To make sure that the above recommendation works, it is very important that you make sure your medication refill appointments are scheduled at least 1 week before you run out of medicine. To do this in an effective manner, make sure that you do not leave the office without scheduling your next medication management appointment. Always ask the nursing staff to show you in your prescription , when your medication will be running out. Then arrange for the receptionist to get you a return appointment,  at least 7 days before you run out of medicine. Do not wait until you have 1 or 2 pills left, to come in. This is very poor planning and does not take into consideration that we may need to cancel appointments due to bad weather, sickness, or emergencies affecting our staff. DO NOT ACCEPT A "Partial Fill": If for any reason your pharmacy does not have enough pills/tablets to completely fill or refill your prescription, do not allow for a "partial fill". The law allows the pharmacy to complete that prescription within 72 hours, without requiring a new prescription. If they do not fill the rest of your prescription within those 72 hours, you will need a separate prescription to fill the remaining amount, which we will NOT provide. If the reason for the partial fill is your insurance, you will need to talk to the pharmacist about payment alternatives for the remaining tablets, but again, DO NOT ACCEPT A PARTIAL FILL, unless you can trust your pharmacist to obtain the remainder of the pills within 72 hours.  Prescription refills and/or changes in medication(s):  Prescription refills, and/or changes in dose or medication, will be conducted only during scheduled medication management appointments. (Applies to both, written and electronic prescriptions.) No refills on procedure days. No medication will be changed or started on procedure days. No changes, adjustments, and/or refills will be conducted on a procedure day. Doing so will interfere with the diagnostic portion of the procedure. No phone refills. No medications will be "called into the pharmacy". No Fax refills. No weekend refills. No Holliday refills. No after hours refills.  Remember:  Business hours are:  Monday to Thursday 8:00 AM to 4:00 PM Provider's Schedule: Rieley Hausman, MD - Appointments are:  Medication management: Monday and Wednesday 8:00 AM to 4:00 PM Procedure day: Tuesday and Thursday 7:30 AM to 4:00 PM Bilal Lateef, MD -  Appointments are:  Medication management: Tuesday and Thursday 8:00   AM to 4:00 PM Procedure day: Monday and Wednesday 7:30 AM to 4:00 PM (Last update: 02/21/2020) ____________________________________________________________________________________________  ____________________________________________________________________________________________  CBD (cannabidiol) & Delta-8 (Delta-8 tetrahydrocannabinol) WARNING  Intro: Cannabidiol (CBD) and tetrahydrocannabinol (THC), are two natural compounds found in plants of the Cannabis genus. They can both be extracted from hemp or cannabis. Hemp and cannabis come from the Cannabis sativa plant. Both compounds interact with your body's endocannabinoid system, but they have very different effects. CBD does not produce the high sensation associated with cannabis. Delta-8 tetrahydrocannabinol, also known as delta-8 THC, is a psychoactive substance found in the Cannabis sativa plant, of which marijuana and hemp are two varieties. THC is responsible for the high associated with the illicit use of marijuana.  Applicable to: All individuals currently taking or considering taking CBD (cannabidiol) and, more important, all patients taking opioid analgesic controlled substances (pain medication). (Example: oxycodone; oxymorphone; hydrocodone; hydromorphone; morphine; methadone; tramadol; tapentadol; fentanyl; buprenorphine; butorphanol; dextromethorphan; meperidine; codeine; etc.)  Legal status: CBD remains a Schedule I drug prohibited for any use. CBD is illegal with one exception. In the [place], CBD has a limited Food and Drug Administration (FDA) approval for the treatment of two specific types of epilepsy disorders. Only one CBD product has been approved by the FDA for this purpose: "Epidiolex". FDA is aware that some companies are marketing products containing cannabis and cannabis-derived compounds in ways that violate the Federal Food, Drug and Cosmetic Act  (FD&C Act) and that may put the health and safety of consumers at risk. The FDA, a Federal agency, has not enforced the CBD status since 2018. UPDATE: (09/19/2021) The Drug Enforcement Agency (DEA) issued a letter stating that "delta" cannabinoids, including Delta-8-THCO and Delta-9-THCO, synthetically derived from hemp do not qualify as hemp and will be viewed as Schedule I drugs. (Schedule I drugs, substances, or chemicals are defined as drugs with no currently accepted medical use and a high potential for abuse. Some examples of Schedule I drugs are: heroin, lysergic acid diethylamide (LSD), marijuana (cannabis), 3,4-methylenedioxymethamphetamine (ecstasy), methaqualone, and peyote.) (https://www.dea.gov)  Legality: Some manufacturers ship CBD products nationally, which is illegal. Often such products are sold online and are therefore available throughout the country. CBD is openly sold in head shops and health food stores in some states where such sales have not been explicitly legalized. Selling unapproved products with unsubstantiated therapeutic claims is not only a violation of the law, but also can put patients at risk, as these products have not been proven to be safe or effective. Federal illegality makes it difficult to conduct research on CBD.  Reference: "FDA Regulation of Cannabis and Cannabis-Derived Products, Including Cannabidiol (CBD)" - https://www.fda.gov/news-events/public-health-focus/fda-regulation-cannabis-and-cannabis-derived-products-including-cannabidiol-cbd  Warning: CBD is not FDA approved and has not undergo the same manufacturing controls as prescription drugs.  This means that the purity and safety of available CBD may be questionable. Most of the time, despite manufacturer's claims, it is contaminated with THC (delta-9-tetrahydrocannabinol - the chemical in marijuana responsible for the "HIGH").  When this is the case, the THC contaminant will trigger a positive urine drug  screen (UDS) test for Marijuana (carboxy-THC). Because a positive UDS for any illicit substance is a violation of our medication agreement, your opioid analgesics (pain medicine) may be permanently discontinued. The FDA recently put out a warning about 5 things that everyone should be aware of regarding Delta-8 THC: Delta-8 THC products have not been evaluated or approved by the FDA for safe use and may be marketed in ways that put the   public health at risk. The FDA has received adverse event reports involving delta-8 THC-containing products. Delta-8 THC has psychoactive and intoxicating effects. Delta-8 THC manufacturing often involve use of potentially harmful chemicals to create the concentrations of delta-8 THC claimed in the marketplace. The final delta-8 THC product may have potentially harmful by-products (contaminants) due to the chemicals used in the process. Manufacturing of delta-8 THC products may occur in uncontrolled or unsanitary settings, which may lead to the presence of unsafe contaminants or other potentially harmful substances. Delta-8 THC products should be kept out of the reach of children and pets.  MORE ABOUT CBD  General Information: CBD was discovered in 1940 and it is a derivative of the cannabis sativa genus plants (Marijuana and Hemp). It is one of the 113 identified substances found in Marijuana. It accounts for up to 40% of the plant's extract. As of 2018, preliminary clinical studies on CBD included research for the treatment of anxiety, movement disorders, and pain. CBD is available and consumed in multiple forms, including inhalation of smoke or vapor, as an aerosol spray, and by mouth. It may be supplied as an oil containing CBD, capsules, dried cannabis, or as a liquid solution. CBD is thought not to be as psychoactive as THC (delta-9-tetrahydrocannabinol - the chemical in marijuana responsible for the "HIGH"). Studies suggest that CBD may interact with different  biological target receptors in the body, including cannabinoid and other neurotransmitter receptors. As of 2018 the mechanism of action for its biological effects has not been determined.  Side-effects  Adverse reactions: Dry mouth, diarrhea, decreased appetite, fatigue, drowsiness, malaise, weakness, sleep disturbances, and others.  Drug interactions: CBC may interact with other medications such as blood-thinners. Because CBD causes drowsiness on its own, it also increases the drowsiness caused by other medications, including antihistamines (such as Benadryl), benzodiazepines (Xanax, Ativan, Valium), antipsychotics, antidepressants and opioids, as well as alcohol and supplements such as kava, melatonin and St. John's Wort. Be cautious with the following combinations:   Brivaracetam (Briviact) Brivaracetam is changed and broken down by the body. CBD might decrease how quickly the body breaks down brivaracetam. This might increase levels of brivaracetam in the body.  Caffeine Caffeine is changed and broken down by the body. CBD might decrease how quickly the body breaks down caffeine. This might increase levels of caffeine in the body.  Carbamazepine (Tegretol) Carbamazepine is changed and broken down by the body. CBD might decrease how quickly the body breaks down carbamazepine. This might increase levels of carbamazepine in the body and increase its side effects.  Citalopram (Celexa) Citalopram is changed and broken down by the body. CBD might decrease how quickly the body breaks down citalopram. This might increase levels of citalopram in the body and increase its side effects.  Clobazam (Onfi) Clobazam is changed and broken down by the liver. CBD might decrease how quickly the liver breaks down clobazam. This might increase the effects and side effects of clobazam.  Eslicarbazepine (Aptiom) Eslicarbazepine is changed and broken down by the body. CBD might decrease how quickly the body  breaks down eslicarbazepine. This might increase levels of eslicarbazepine in the body by a small amount.  Everolimus (Zostress) Everolimus is changed and broken down by the body. CBD might decrease how quickly the body breaks down everolimus. This might increase levels of everolimus in the body.  Lithium Taking higher doses of CBD might increase levels of lithium. This can increase the risk of lithium toxicity.  Medications changed by the liver (  Cytochrome P450 1A1 (CYP1A1) substrates) Some medications are changed and broken down by the liver. CBD might change how quickly the liver breaks down these medications. This could change the effects and side effects of these medications.  Medications changed by the liver (Cytochrome P450 1A2 (CYP1A2) substrates) Some medications are changed and broken down by the liver. CBD might change how quickly the liver breaks down these medications. This could change the effects and side effects of these medications.  Medications changed by the liver (Cytochrome P450 1B1 (CYP1B1) substrates) Some medications are changed and broken down by the liver. CBD might change how quickly the liver breaks down these medications. This could change the effects and side effects of these medications.  Medications changed by the liver (Cytochrome P450 2A6 (CYP2A6) substrates) Some medications are changed and broken down by the liver. CBD might change how quickly the liver breaks down these medications. This could change the effects and side effects of these medications.  Medications changed by the liver (Cytochrome P450 2B6 (CYP2B6) substrates) Some medications are changed and broken down by the liver. CBD might change how quickly the liver breaks down these medications. This could change the effects and side effects of these medications.  Medications changed by the liver (Cytochrome P450 2C19 (CYP2C19) substrates) Some medications are changed and broken down by the liver.  CBD might change how quickly the liver breaks down these medications. This could change the effects and side effects of these medications.  Medications changed by the liver (Cytochrome P450 2C8 (CYP2C8) substrates) Some medications are changed and broken down by the liver. CBD might change how quickly the liver breaks down these medications. This could change the effects and side effects of these medications.  Medications changed by the liver (Cytochrome P450 2C9 (CYP2C9) substrates) Some medications are changed and broken down by the liver. CBD might change how quickly the liver breaks down these medications. This could change the effects and side effects of these medications.  Medications changed by the liver (Cytochrome P450 2D6 (CYP2D6) substrates) Some medications are changed and broken down by the liver. CBD might change how quickly the liver breaks down these medications. This could change the effects and side effects of these medications.  Medications changed by the liver (Cytochrome P450 2E1 (CYP2E1) substrates) Some medications are changed and broken down by the liver. CBD might change how quickly the liver breaks down these medications. This could change the effects and side effects of these medications.  Medications changed by the liver (Cytochrome P450 3A4 (CYP3A4) substrates) Some medications are changed and broken down by the liver. CBD might change how quickly the liver breaks down these medications. This could change the effects and side effects of these medications.  Medications changed by the liver (Glucuronidated drugs) Some medications are changed and broken down by the liver. CBD might change how quickly the liver breaks down these medications. This could change the effects and side effects of these medications.  Medications that decrease the breakdown of other medications by the liver (Cytochrome P450 2C19 (CYP2C19) inhibitors) CBD is changed and broken down by the liver.  Some drugs decrease how quickly the liver changes and breaks down CBD. This could change the effects and side effects of CBD.  Medications that decrease the breakdown of other medications in the liver (Cytochrome P450 3A4 (CYP3A4) inhibitors) CBD is changed and broken down by the liver. Some drugs decrease how quickly the liver changes and breaks down CBD. This could change the effects   and side effects of CBD.  Medications that increase breakdown of other medications by the liver (Cytochrome P450 3A4 (CYP3A4) inducers) CBD is changed and broken down by the liver. Some drugs increase how quickly the liver changes and breaks down CBD. This could change the effects and side effects of CBD.  Medications that increase the breakdown of other medications by the liver (Cytochrome P450 2C19 (CYP2C19) inducers) CBD is changed and broken down by the liver. Some drugs increase how quickly the liver changes and breaks down CBD. This could change the effects and side effects of CBD.  Methadone (Dolophine) Methadone is broken down by the liver. CBD might decrease how quickly the liver breaks down methadone. Taking cannabidiol along with methadone might increase the effects and side effects of methadone.  Rufinamide (Banzel) Rufinamide is changed and broken down by the body. CBD might decrease how quickly the body breaks down rufinamide. This might increase levels of rufinamide in the body by a small amount.  Sedative medications (CNS depressants) CBD might cause sleepiness and slowed breathing. Some medications, called sedatives, can also cause sleepiness and slowed breathing. Taking CBD with sedative medications might cause breathing problems and/or too much sleepiness.  Sirolimus (Rapamune) Sirolimus is changed and broken down by the body. CBD might decrease how quickly the body breaks down sirolimus. This might increase levels of sirolimus in the body.  Stiripentol (Diacomit) Stiripentol is changed and  broken down by the body. CBD might decrease how quickly the body breaks down stiripentol. This might increase levels of stiripentol in the body and increase its side effects.  Tacrolimus (Prograf) Tacrolimus is changed and broken down by the body. CBD might decrease how quickly the body breaks down tacrolimus. This might increase levels of tacrolimus in the body.  Tamoxifen (Soltamox) Tamoxifen is changed and broken down by the body. CBD might affect how quickly the body breaks down tamoxifen. This might affect levels of tamoxifen in the body.  Topiramate (Topamax) Topiramate is changed and broken down by the body. CBD might decrease how quickly the body breaks down topiramate. This might increase levels of topiramate in the body by a small amount.  Valproate Valproic acid can cause liver injury. Taking cannabidiol with valproic acid might increase the chance of liver injury. CBD and/or valproic acid might need to be stopped, or the dose might need to be reduced.  Warfarin (Coumadin) CBD might increase levels of warfarin, which can increase the risk for bleeding. CBD and/or warfarin might need to be stopped, or the dose might need to be reduced.  Zonisamide Zonisamide is changed and broken down by the body. CBD might decrease how quickly the body breaks down zonisamide. This might increase levels of zonisamide in the body by a small amount. (Last update: 10/01/2021) ____________________________________________________________________________________________  ____________________________________________________________________________________________  Drug Holidays (Slow)  What is a "Drug Holiday"? Drug Holiday: is the name given to the period of time during which a patient stops taking a medication(s) for the purpose of eliminating tolerance to the drug.  Benefits Improved effectiveness of opioids. Decreased opioid dose needed to achieve benefits. Improved pain with lesser  dose.  What is tolerance? Tolerance: is the progressive decreased in effectiveness of a drug due to its repetitive use. With repetitive use, the body gets use to the medication and as a consequence, it loses its effectiveness. This is a common problem seen with opioid pain medications. As a result, a larger dose of the drug is needed to achieve the same effect that   used to be obtained with a smaller dose.  How long should a "Drug Holiday" last? You should stay off of the pain medicine for at least 14 consecutive days. (2 weeks)  Should I stop the medicine "cold turkey"? No. You should always coordinate with your Pain Specialist so that he/she can provide you with the correct medication dose to make the transition as smoothly as possible.  How do I stop the medicine? Slowly. You will be instructed to decrease the daily amount of pills that you take by one (1) pill every seven (7) days. This is called a "slow downward taper" of your dose. For example: if you normally take four (4) pills per day, you will be asked to drop this dose to three (3) pills per day for seven (7) days, then to two (2) pills per day for seven (7) days, then to one (1) per day for seven (7) days, and at the end of those last seven (7) days, this is when the "Drug Holiday" would start.   Will I have withdrawals? By doing a "slow downward taper" like this one, it is unlikely that you will experience any significant withdrawal symptoms. Typically, what triggers withdrawals is the sudden stop of a high dose opioid therapy. Withdrawals can usually be avoided by slowly decreasing the dose over a prolonged period of time. If you do not follow these instructions and decide to stop your medication abruptly, withdrawals may be possible.  What are withdrawals? Withdrawals: refers to the wide range of symptoms that occur after stopping or dramatically reducing opiate drugs after heavy and prolonged use. Withdrawal symptoms do not occur to  patients that use low dose opioids, or those who take the medication sporadically. Contrary to benzodiazepine (example: Valium, Xanax, etc.) or alcohol withdrawals ("Delirium Tremens"), opioid withdrawals are not lethal. Withdrawals are the physical manifestation of the body getting rid of the excess receptors.  Expected Symptoms Early symptoms of withdrawal may include: Agitation Anxiety Muscle aches Increased tearing Insomnia Runny nose Sweating Yawning  Late symptoms of withdrawal may include: Abdominal cramping Diarrhea Dilated pupils Goose bumps Nausea Vomiting  Will I experience withdrawals? Due to the slow nature of the taper, it is very unlikely that you will experience any.  What is a slow taper? Taper: refers to the gradual decrease in dose.  (Last update: 02/21/2020) ____________________________________________________________________________________________    

## 2021-12-31 NOTE — Progress Notes (Signed)
Nursing Pain Medication Assessment:  Safety precautions to be maintained throughout the outpatient stay will include: orient to surroundings, keep bed in low position, maintain call bell within reach at all times, provide assistance with transfer out of bed and ambulation.  Medication Inspection Compliance: Pill count conducted under aseptic conditions, in front of the patient. Neither the pills nor the bottle was removed from the patient's sight at any time. Once count was completed pills were immediately returned to the patient in their original bottle.  Medication: Oxycodone IR Pill/Patch Count:  7 of 150 pills remain Pill/Patch Appearance: Markings consistent with prescribed medication Bottle Appearance: Standard pharmacy container. Clearly labeled. Filled Date: 05 / 01 / 2023 Last Medication intake:  Today

## 2022-01-04 LAB — TOXASSURE SELECT 13 (MW), URINE

## 2022-01-26 ENCOUNTER — Telehealth: Payer: Self-pay | Admitting: Pain Medicine

## 2022-01-26 NOTE — Telephone Encounter (Signed)
Patient instructed to call other pharmacies to find out which have his meds in stock.

## 2022-01-26 NOTE — Telephone Encounter (Signed)
Patient stated that pharmacy is still out of meds. Pt wants to see if doctor can give him something else to take until meds are in at his current pharmacy. Please give patient a call. Thanks

## 2022-01-27 ENCOUNTER — Telehealth: Payer: Self-pay | Admitting: Pain Medicine

## 2022-01-27 NOTE — Telephone Encounter (Signed)
Called patient. He is going to call the [facility] and make sure they have the supply and call me back.

## 2022-02-02 ENCOUNTER — Other Ambulatory Visit: Payer: Self-pay | Admitting: Cardiovascular Disease

## 2022-02-02 DIAGNOSIS — E785 Hyperlipidemia, unspecified: Secondary | ICD-10-CM

## 2022-02-02 DIAGNOSIS — I10 Essential (primary) hypertension: Secondary | ICD-10-CM

## 2022-02-05 ENCOUNTER — Telehealth: Payer: Self-pay | Admitting: Pain Medicine

## 2022-02-05 ENCOUNTER — Other Ambulatory Visit: Payer: Self-pay

## 2022-02-05 ENCOUNTER — Telehealth: Payer: Self-pay

## 2022-02-05 DIAGNOSIS — Z79899 Other long term (current) drug therapy: Secondary | ICD-10-CM

## 2022-02-05 DIAGNOSIS — M5030 Other cervical disc degeneration, unspecified cervical region: Secondary | ICD-10-CM

## 2022-02-05 DIAGNOSIS — G8929 Other chronic pain: Secondary | ICD-10-CM

## 2022-02-05 DIAGNOSIS — M5137 Other intervertebral disc degeneration, lumbosacral region: Secondary | ICD-10-CM

## 2022-02-05 DIAGNOSIS — M47816 Spondylosis without myelopathy or radiculopathy, lumbar region: Secondary | ICD-10-CM

## 2022-02-05 DIAGNOSIS — Z79891 Long term (current) use of opiate analgesic: Secondary | ICD-10-CM

## 2022-02-05 DIAGNOSIS — G894 Chronic pain syndrome: Secondary | ICD-10-CM

## 2022-02-05 MED ORDER — OXYCODONE HCL 10 MG PO TABS: 10.0000 mg | ORAL_TABLET | Freq: Every day | ORAL | 0 refills | Status: DC

## 2022-02-05 NOTE — Telephone Encounter (Signed)
Patient has been unable to get his pain medicine, Oxycodone 10mg , he has been without almost a month. Is there anything Dr. can do. Can his meds be changed? The pharmacies dont have Oxycodone or Hydrocodone. Please talk to dr Laban Emperor for this patient.

## 2022-02-05 NOTE — Telephone Encounter (Signed)
[facility] pharmacy has his medicines. The phone number is [phone] Please call out today, he has been out for a month due to the shortage and is in a lot of pain.

## 2022-02-05 NOTE — Telephone Encounter (Signed)
Called current pharm and discont nacr and notified pt

## 2022-02-05 NOTE — Telephone Encounter (Signed)
Spoke with patient and ask him to find a pharmacy that has his medication and we will be glad to ask [facility] to resend the Rx.

## 2022-03-10 ENCOUNTER — Other Ambulatory Visit: Payer: Self-pay | Admitting: Cardiovascular Disease

## 2022-03-10 ENCOUNTER — Ambulatory Visit: Payer: Medicare Other | Admitting: Cardiovascular Disease

## 2022-03-10 DIAGNOSIS — E785 Hyperlipidemia, unspecified: Secondary | ICD-10-CM

## 2022-03-10 DIAGNOSIS — I10 Essential (primary) hypertension: Secondary | ICD-10-CM

## 2022-03-14 ENCOUNTER — Other Ambulatory Visit (HOSPITAL_COMMUNITY): Payer: Self-pay | Admitting: Internal Medicine

## 2022-03-23 ENCOUNTER — Encounter: Payer: Medicare Other | Admitting: Pain Medicine

## 2022-03-23 ENCOUNTER — Ambulatory Visit: Payer: Medicare Other | Admitting: Cardiovascular Disease
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
## 2022-03-25 ENCOUNTER — Telehealth: Payer: Self-pay

## 2022-03-25 ENCOUNTER — Other Ambulatory Visit: Payer: Self-pay

## 2022-03-25 ENCOUNTER — Ambulatory Visit: Payer: Medicare Other | Attending: Pain Medicine | Admitting: Pain Medicine

## 2022-03-25 ENCOUNTER — Encounter: Payer: Self-pay | Admitting: Pain Medicine

## 2022-03-25 VITALS — BP 125/67 | HR 71 | Temp 97.0°F | Resp 16 | Ht 68.0 in | Wt 187.0 lb

## 2022-03-25 DIAGNOSIS — Z79899 Other long term (current) drug therapy: Secondary | ICD-10-CM

## 2022-03-25 DIAGNOSIS — Z79891 Long term (current) use of opiate analgesic: Secondary | ICD-10-CM | POA: Insufficient documentation

## 2022-03-25 DIAGNOSIS — G894 Chronic pain syndrome: Secondary | ICD-10-CM

## 2022-03-25 DIAGNOSIS — M79602 Pain in left arm: Secondary | ICD-10-CM | POA: Diagnosis present

## 2022-03-25 DIAGNOSIS — M79601 Pain in right arm: Secondary | ICD-10-CM | POA: Insufficient documentation

## 2022-03-25 DIAGNOSIS — G8929 Other chronic pain: Secondary | ICD-10-CM | POA: Diagnosis present

## 2022-03-25 DIAGNOSIS — M47816 Spondylosis without myelopathy or radiculopathy, lumbar region: Secondary | ICD-10-CM | POA: Insufficient documentation

## 2022-03-25 DIAGNOSIS — M5137 Other intervertebral disc degeneration, lumbosacral region: Secondary | ICD-10-CM | POA: Diagnosis present

## 2022-03-25 DIAGNOSIS — M5030 Other cervical disc degeneration, unspecified cervical region: Secondary | ICD-10-CM | POA: Diagnosis present

## 2022-03-25 DIAGNOSIS — M5442 Lumbago with sciatica, left side: Secondary | ICD-10-CM | POA: Diagnosis present

## 2022-03-25 DIAGNOSIS — M542 Cervicalgia: Secondary | ICD-10-CM | POA: Diagnosis present

## 2022-03-25 DIAGNOSIS — M79605 Pain in left leg: Secondary | ICD-10-CM | POA: Insufficient documentation

## 2022-03-25 MED ORDER — OXYCODONE HCL 10 MG PO TABS: 10.0000 mg | ORAL_TABLET | Freq: Every day | ORAL | 0 refills | Status: DC

## 2022-03-25 NOTE — Progress Notes (Signed)
Nursing Pain Medication Assessment:  Safety precautions to be maintained throughout the outpatient stay will include: orient to surroundings, keep bed in low position, maintain call bell within reach at all times, provide assistance with transfer out of bed and ambulation.  Medication Inspection Compliance: Pill count conducted under aseptic conditions, in front of the patient. Neither the pills nor the bottle was removed from the patient's sight at any time. Once count was completed pills were immediately returned to the patient in their original bottle.  Medication: Oxycodone IR Pill/Patch Count:  67 of 150 pills remain Pill/Patch Appearance: Markings consistent with prescribed medication Bottle Appearance: Standard pharmacy container. Clearly labeled. Filled Date: 08 / 08 / 2023 Last Medication intake:  Today

## 2022-03-25 NOTE — Patient Instructions (Signed)

## 2022-03-25 NOTE — Telephone Encounter (Signed)
Spoke to pharmacy at [facility] and cancelled 3 scripts for oxycodone that were sent today by Dr Laban Emperor.

## 2022-03-31 ENCOUNTER — Other Ambulatory Visit: Payer: Self-pay | Admitting: Cardiovascular Disease

## 2022-03-31 DIAGNOSIS — E785 Hyperlipidemia, unspecified: Secondary | ICD-10-CM

## 2022-03-31 DIAGNOSIS — I10 Essential (primary) hypertension: Secondary | ICD-10-CM

## 2022-04-01 ENCOUNTER — Ambulatory Visit: Payer: Medicare Other | Attending: Cardiovascular Disease | Admitting: Cardiovascular Disease

## 2022-04-01 ENCOUNTER — Telehealth: Payer: Self-pay

## 2022-04-01 ENCOUNTER — Encounter: Payer: Self-pay | Admitting: Cardiovascular Disease

## 2022-04-01 VITALS — BP 110/70 | HR 71 | Ht 68.0 in | Wt 189.0 lb

## 2022-04-01 DIAGNOSIS — I25118 Atherosclerotic heart disease of native coronary artery with other forms of angina pectoris: Secondary | ICD-10-CM

## 2022-04-01 DIAGNOSIS — I272 Pulmonary hypertension, unspecified: Secondary | ICD-10-CM | POA: Diagnosis not present

## 2022-04-01 DIAGNOSIS — E782 Mixed hyperlipidemia: Secondary | ICD-10-CM | POA: Diagnosis not present

## 2022-04-01 DIAGNOSIS — I10 Essential (primary) hypertension: Secondary | ICD-10-CM | POA: Diagnosis not present

## 2022-04-01 DIAGNOSIS — G4700 Insomnia, unspecified: Secondary | ICD-10-CM

## 2022-04-01 MED ORDER — ISOSORBIDE MONONITRATE ER 30 MG PO TB24: 30.0000 mg | ORAL_TABLET | Freq: Every day | ORAL | 3 refills | Status: DC

## 2022-04-01 MED ORDER — SPIRONOLACTONE 25 MG PO TABS: 12.5000 mg | ORAL_TABLET | Freq: Every day | ORAL | 3 refills | Status: AC

## 2022-04-01 NOTE — Telephone Encounter (Signed)
Prior Authorization for ITAMAR sent to [employer] via Phone. Reference # . NO PA REQ 

## 2022-04-01 NOTE — Progress Notes (Signed)
Cardiology Office Note:    Date:  04/01/2022   ID:  [name] [name], DOB [birth year], MRN [id number]  PCP:  Gordan Payment., MD   [facility] Providers Cardiologist:  [name], MD     Referring MD: Gordan Payment., MD   Chief Complaint  Patient presents with   Coronary Artery Disease    History of Present Illness:    [name] [name] is a 71 y.o. male with a hx of: Coronary artery disease  S/p CABG with early SVG failure S/p PCI of L-LAD anastomosis  Long term DAPT Hyperlipidemia  Hypertension  GERD Chronic pain S/p cholecystectomy Pulmonary hypertension  The patient is here alone today.  He is feeling well.  I referred him to Dr. Gala Romney last year for pulmonary hypertension evaluation after an echocardiogram showed RV dilatation and elevated PA systolic pressure.  The patient's PFTs were unremarkable.  It does not look like the patient followed through with his VQ scan or right heart catheterization.  He was started on spironolactone 12.5 mg daily for exam evidence of mild volume overload and he has done well on this.  His potassium was within normal limits in March of this year by his PCP.  He has no cardiac-related complaints today.  He has some mild limitation from low back and leg problems.  He denies shortness of breath, orthopnea, PND, heart palpitations, or chest pain.  Past Medical History:  Diagnosis Date   CAD (coronary artery disease)    CABG   Chronic pain syndrome    GERD (gastroesophageal reflux disease)    Hyperlipidemia    Hypertension    Radiculitis of right cervical region 09/04/2015    Past Surgical History:  Procedure Laterality Date   CHOLECYSTECTOMY     CORONARY ARTERY BYPASS GRAFT     multivessel   HERNIA REPAIR     LEFT HEART CATHETERIZATION WITH CORONARY/GRAFT ANGIOGRAM N/A 04/21/2013   Procedure: LEFT HEART CATHETERIZATION WITH [name];  Surgeon: Micheline Chapman, MD;  Location: [facility] CATH LAB;  Service:  Cardiovascular;  Laterality: N/A;   PCI 2009 with stenting of LIMA-LAD anastamosis      Current Medications: Current Meds  Medication Sig   aspirin 81 MG tablet Take 1 tablet (81 mg total) by mouth daily.   atorvastatin (LIPITOR) 20 MG tablet TAKE 1 TABLET BY MOUTH EVERY DAY   Cholecalciferol (VITAMIN D3) 50 MCG (2000 UT) capsule Take 1 capsule (2,000 Units total) by mouth daily.   clopidogrel (PLAVIX) 75 MG tablet TAKE 1 TABLET BY MOUTH EVERY DAY   hydrochlorothiazide (HYDRODIURIL) 25 MG tablet Take 25 mg by mouth.   magnesium oxide (MAG-OX) 400 MG tablet Take 1 tablet (400 mg total) by mouth 2 (two) times daily.   metoprolol succinate (TOPROL-XL) 50 MG 24 hr tablet TAKE WITH OR IMMEDIATELY FOLLOWING A MEAL.   nitroGLYCERIN (NITROSTAT) 0.4 MG SL tablet PLACE 1 TABLET (0.4 MG TOTAL) UNDER THE TONGUE EVERY 5 (FIVE) MINUTES AS NEEDED FOR CHEST PAIN.   Oxycodone HCl 10 MG TABS Take 1 tablet (10 mg total) by mouth 5 (five) times daily. Must last 30 days.   [START ON 04/30/2022] Oxycodone HCl 10 MG TABS Take 1 tablet (10 mg total) by mouth 5 (five) times daily. Must last 30 days.   [START ON 05/30/2022] Oxycodone HCl 10 MG TABS Take 1 tablet (10 mg total) by mouth 5 (five) times daily. Must last 30 days.   pantoprazole (PROTONIX) 40 MG tablet TAKE 1  TABLET BY MOUTH EVERY DAY   vitamin B-12 (CYANOCOBALAMIN) 1000 MCG tablet Take 2,000 mcg by mouth daily.   [DISCONTINUED] isosorbide mononitrate (IMDUR) 30 MG 24 hr tablet TAKE 1 TABLET (30 MG TOTAL) BY MOUTH DAILY.   [DISCONTINUED] spironolactone (ALDACTONE) 25 MG tablet Take 0.5 tablets (12.5 mg total) by mouth daily. NEEDS OFFICE VISIT FOR ANYMORE REFILLS     Allergies:   Gabapentin   Social History   Socioeconomic History   Marital status: Married    Spouse name: Not on file   Number of children: Not on file   Years of education: Not on file   Highest education level: Not on file  Occupational History   Not on file  Tobacco Use    Smoking status: Former   Smokeless tobacco: Never  Substance and Sexual Activity   Alcohol use: No    Alcohol/week: 0.0 standard drinks of alcohol   Drug use: No   Sexual activity: Not on file  Other Topics Concern   Not on file  Social History Narrative   Not on file   Social Determinants of Health   Financial Resource Strain: Not on file  Food Insecurity: Not on file  Transportation Needs: Not on file  Physical Activity: Not on file  Stress: Not on file  Social Connections: Not on file     Family History: The patient's family history includes Multiple sclerosis in his mother; Stroke in his father.  ROS:   Please see the history of present illness.    Positive for insomnia, low back pain, and leg weakness.  All other systems reviewed and are negative.  EKGs/Labs/Other Studies Reviewed:    The following studies were reviewed today: Echocardiogram 01/22/2021: 1. Left ventricular ejection fraction, by estimation, is 60 to 65%. The  left ventricle has normal function. The left ventricle has no regional  wall motion abnormalities. There is mild left ventricular hypertrophy.  Left ventricular diastolic parameters  were normal.   2. Right ventricular systolic function is mildly reduced. The right  ventricular size is moderately enlarged. There is moderately elevated  pulmonary artery systolic pressure. The estimated right ventricular  systolic pressure is 55.9 mmHg.   3. Right atrial size was severely dilated.   4. The mitral valve is normal in structure. Trivial mitral valve  regurgitation. No evidence of mitral stenosis.   5. Tricuspid valve regurgitation is mild to moderate.   6. The aortic valve is tricuspid. Aortic valve regurgitation is mild.  Mild aortic valve sclerosis is present, with no evidence of aortic valve  stenosis.   7. Aortic dilatation noted. There is borderline dilatation of the  ascending aorta, measuring 38 mm.   8. The inferior vena cava is dilated  in size with >50% respiratory  variability, suggesting right atrial pressure of 8 mmHg.   EKG:  EKG is not ordered today.   Recent Labs: 06/13/2021: B Natriuretic Peptide 72.1; BUN 10; Creatinine, Ser 0.89; Hemoglobin 13.7; Platelets 189; Potassium 4.2; Sodium 138  Recent Lipid Panel    Component Value Date/Time   CHOL 114 07/17/2020 0948   TRIG 62 07/17/2020 0948   HDL 48 07/17/2020 0948   CHOLHDL 2.4 07/17/2020 0948   CHOLHDL 3.8 06/18/2016 0954   VLDL 19 06/18/2016 0954   LDLCALC 52 07/17/2020 0948   LDLDIRECT 92.7 07/04/2009 0946     Risk Assessment/Calculations:                Physical Exam:    VS:  BP 110/70   Pulse 71   Ht 5\' 8"  (1.727 m)   Wt 189 lb (85.7 kg)   SpO2 98%   BMI 28.74 kg/m     Wt Readings from Last 3 Encounters:  04/01/22 189 lb (85.7 kg)  03/25/22 187 lb (84.8 kg)  12/31/21 191 lb (86.6 kg)     GEN:  Well nourished, well developed in no acute distress HEENT: Normal NECK: No JVD; No carotid bruits LYMPHATICS: No lymphadenopathy CARDIAC: RRR, no murmurs, rubs, gallops RESPIRATORY:  Clear to auscultation without rales, wheezing or rhonchi  ABDOMEN: Soft, non-tender, non-distended MUSCULOSKELETAL:  No edema; No deformity  SKIN: Warm and dry NEUROLOGIC:  Alert and oriented x 3 PSYCHIATRIC:  Normal affect   ASSESSMENT:    1. Insomnia, unspecified type   2. Mixed hyperlipidemia   3. Essential hypertension, benign   4. Pulmonary hypertension, unspecified (HCC)   5. Coronary artery disease of native artery of native heart with stable angina pectoris (HCC)    PLAN:    In order of problems listed above:  In the setting of pulmonary hypertension, I have recommended a sleep study Treated with atorvastatin 20 mg daily.  LDL in February of this year was 55 at goal.  Continue same. Blood pressure is controlled on his current regimen of hydrochlorothiazide, isosorbide, metoprolol succinate, and spironolactone. Repeat 2D echocardiogram as it  has been 1 year since his last evaluation.  This will help evaluate RV size and function as well as estimated PA pressures.  Minimally symptomatic at present.  Check sleep study as above. Maintained on dual antiplatelet therapy with aspirin and clopidogrel, statin drug, a beta-blocker, and a long-acting nitrate.  No angina on his current regimen.           Medication Adjustments/Labs and Tests Ordered: Current medicines are reviewed at length with the patient today.  Concerns regarding medicines are outlined above.  Orders Placed This Encounter  Procedures   ECHOCARDIOGRAM COMPLETE   Itamar Sleep Study   Meds ordered this encounter  Medications   isosorbide mononitrate (IMDUR) 30 MG 24 hr tablet    Sig: Take 1 tablet (30 mg total) by mouth daily.    Dispense:  90 tablet    Refill:  3   spironolactone (ALDACTONE) 25 MG tablet    Sig: Take 0.5 tablets (12.5 mg total) by mouth daily.    Dispense:  45 tablet    Refill:  3    Patient Instructions  Medication Instructions:  Your physician recommends that you continue on your current medications as directed. Please refer to the Current Medication list given to you today.  *If you need a refill on your cardiac medications before your next appointment, please call your pharmacy*   Lab Work: NONE If you have labs (blood work) drawn today and your tests are completely normal, you will receive your results only by: MyChart Message (if you have MyChart) OR A paper copy in the mail If you have any lab test that is abnormal or we need to change your treatment, we will call you to review the results.   Testing/Procedures: ECHO Your physician has requested that you have an echocardiogram. Echocardiography is a painless test that uses sound waves to create images of your heart. It provides your doctor with information about the size and shape of your heart and how well your heart's chambers and valves are working. This procedure takes  approximately one hour. There are no restrictions for this procedure.  Itamar Sleep study Your physician has recommended that you have a sleep study. This test records several body functions during sleep, including: brain activity, eye movement, oxygen and carbon dioxide blood levels, heart rate and rhythm, breathing rate and rhythm, the flow of air through your mouth and nose, snoring, body muscle movements, and chest and belly movement.  Follow-Up: At [facility], you and your health needs are our priority.  As part of our continuing mission to provide you with exceptional heart care, we have created designated Provider Care Teams.  These Care Teams include your primary Cardiologist (physician) and Advanced Practice Providers (APPs -  Physician Assistants and Nurse Practitioners) who all work together to provide you with the care you need, when you need it.  Your next appointment:   6 month(s)  The format for your next appointment:   In Person  Provider:   [name], MD  or APP     Important Information About Sugar          Signed, [name], MD  04/01/2022 9:46 AM    [facility]

## 2022-04-01 NOTE — Patient Instructions (Signed)
Medication Instructions:  Your physician recommends that you continue on your current medications as directed. Please refer to the Current Medication list given to you today.  *If you need a refill on your cardiac medications before your next appointment, please call your pharmacy*   Lab Work: NONE If you have labs (blood work) drawn today and your tests are completely normal, you will receive your results only by: MyChart Message (if you have MyChart) OR A paper copy in the mail If you have any lab test that is abnormal or we need to change your treatment, we will call you to review the results.   Testing/Procedures: ECHO Your physician has requested that you have an echocardiogram. Echocardiography is a painless test that uses sound waves to create images of your heart. It provides your doctor with information about the size and shape of your heart and how well your heart's chambers and valves are working. This procedure takes approximately one hour. There are no restrictions for this procedure.  Itamar Sleep study Your physician has recommended that you have a sleep study. This test records several body functions during sleep, including: brain activity, eye movement, oxygen and carbon dioxide blood levels, heart rate and rhythm, breathing rate and rhythm, the flow of air through your mouth and nose, snoring, body muscle movements, and chest and belly movement.  Follow-Up: At [facility], you and your health needs are our priority.  As part of our continuing mission to provide you with exceptional heart care, we have created designated Provider Care Teams.  These Care Teams include your primary Cardiologist (physician) and Advanced Practice Providers (APPs -  Physician Assistants and Nurse Practitioners) who all work together to provide you with the care you need, when you need it.  Your next appointment:   6 month(s)  The format for your next appointment:   In  Person  Provider:   [name], MD  or APP     Important Information About Sugar

## 2022-04-01 NOTE — Telephone Encounter (Signed)
[name] [name] given to patient in office today by RMA Witty. Registered to [employer].

## 2022-04-02 NOTE — Telephone Encounter (Signed)
Tried to reach the pt to give him the PIN# for Itamar study and ok to proceed. No answer or vm came on

## 2022-04-03 NOTE — Telephone Encounter (Signed)
I have tried to reach the pt x 3 since yesterday to give him the PIN# and ok proceed with Itamar sleep study. I will send a message to the pt through [facility] CHART with the PIN# [id number].

## 2022-04-14 ENCOUNTER — Ambulatory Visit (HOSPITAL_COMMUNITY): Payer: Medicare Other | Attending: Cardiovascular Disease

## 2022-04-14 DIAGNOSIS — I272 Pulmonary hypertension, unspecified: Secondary | ICD-10-CM | POA: Diagnosis present

## 2022-04-14 LAB — ECHOCARDIOGRAM COMPLETE
Area-P 1/2: 4.49 cm2
P 1/2 time: 450 msec
S' Lateral: 3 cm
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
## 2022-06-22 ENCOUNTER — Encounter: Payer: Self-pay | Admitting: Pain Medicine

## 2022-06-22 ENCOUNTER — Ambulatory Visit: Payer: Medicare Other | Attending: Pain Medicine | Admitting: Pain Medicine

## 2022-06-22 VITALS — BP 126/72 | HR 73 | Temp 97.3°F | Resp 18 | Ht 68.0 in | Wt 187.0 lb

## 2022-06-22 DIAGNOSIS — M542 Cervicalgia: Secondary | ICD-10-CM | POA: Insufficient documentation

## 2022-06-22 DIAGNOSIS — M5030 Other cervical disc degeneration, unspecified cervical region: Secondary | ICD-10-CM | POA: Diagnosis present

## 2022-06-22 DIAGNOSIS — M5442 Lumbago with sciatica, left side: Secondary | ICD-10-CM | POA: Insufficient documentation

## 2022-06-22 DIAGNOSIS — G894 Chronic pain syndrome: Secondary | ICD-10-CM | POA: Diagnosis present

## 2022-06-22 DIAGNOSIS — Z79891 Long term (current) use of opiate analgesic: Secondary | ICD-10-CM | POA: Diagnosis present

## 2022-06-22 DIAGNOSIS — Z7901 Long term (current) use of anticoagulants: Secondary | ICD-10-CM | POA: Diagnosis present

## 2022-06-22 DIAGNOSIS — Z79899 Other long term (current) drug therapy: Secondary | ICD-10-CM | POA: Diagnosis present

## 2022-06-22 DIAGNOSIS — M47816 Spondylosis without myelopathy or radiculopathy, lumbar region: Secondary | ICD-10-CM

## 2022-06-22 DIAGNOSIS — M79605 Pain in left leg: Secondary | ICD-10-CM | POA: Diagnosis present

## 2022-06-22 DIAGNOSIS — M79601 Pain in right arm: Secondary | ICD-10-CM | POA: Diagnosis present

## 2022-06-22 DIAGNOSIS — M79602 Pain in left arm: Secondary | ICD-10-CM | POA: Insufficient documentation

## 2022-06-22 DIAGNOSIS — M51379 Other intervertebral disc degeneration, lumbosacral region without mention of lumbar back pain or lower extremity pain: Secondary | ICD-10-CM

## 2022-06-22 DIAGNOSIS — G8929 Other chronic pain: Secondary | ICD-10-CM | POA: Diagnosis present

## 2022-06-22 DIAGNOSIS — M5137 Other intervertebral disc degeneration, lumbosacral region: Secondary | ICD-10-CM | POA: Insufficient documentation

## 2022-06-22 MED ORDER — OXYCODONE HCL 10 MG PO TABS: 10.0000 mg | ORAL_TABLET | Freq: Every day | ORAL | 0 refills | Status: DC

## 2022-06-22 NOTE — Patient Instructions (Signed)
____________________________________________________________________________________________  Patient Information update  To: All of our patients.  Re: Name change.  It has been made official that our current name, "[facility]"   will soon be changed to "[facility]".   The purpose of this change is to eliminate any confusion created by the concept of our practice being a "Medication Management Pain Clinic". In the past this has led to the misconception that we treat pain primarily by the use of prescription medications.  Nothing can be farther from the truth.   Understanding PAIN MANAGEMENT: To further understand what our practice does, you first have to understand that "Pain Management" is a subspecialty that requires additional training once a physician has completed their specialty training, which can be in either Anesthesia, Neurology, Psychiatry, or Physical Medicine and Rehabilitation (PMR). Each one of these contributes to the final approach taken by each physician to the management of their patient's pain. To be a "Pain Management Specialist" you must have first completed one of the specialty trainings below.  Anesthesiologists - trained in clinical pharmacology and interventional techniques such as nerve blockade and regional as well as central neuroanatomy. They are trained to block pain before, during, and after surgical interventions.  Neurologists - trained in the diagnosis and pharmacological treatment of complex neurological conditions, such as Multiple Sclerosis, Parkinson's, spinal cord injuries, and other systemic conditions that may be associated with symptoms that may include but are not limited to pain. They tend to rely primarily on the treatment of chronic pain using prescription medications.  Psychiatrist - trained in conditions affecting the psychosocial wellbeing  of patients including but not limited to depression, anxiety, schizophrenia, personality disorders, addiction, and other substance use disorders that may be associated with chronic pain. They tend to rely primarily on the treatment of chronic pain using prescription medications.   Physical Medicine and Rehabilitation (PMR) physicians, also known as physiatrists - trained to treat a wide variety of medical conditions affecting the brain, spinal cord, nerves, bones, joints, ligaments, muscles, and tendons. Their training is primarily aimed at treating patients that have suffered injuries that have caused severe physical impairment. Their training is primarily aimed at the physical therapy and rehabilitation of those patients. They may also work alongside orthopedic surgeons or neurosurgeons using their expertise in assisting surgical patients to recover after their surgeries.  INTERVENTIONAL PAIN MANAGEMENT is sub-subspecialty of Pain Management.  Our physicians are Board-certified in Anesthesia, Pain Management, and Interventional Pain Management.  This meaning that not only have they been trained and Board-certified in their specialty of Anesthesia, and subspecialty of Pain Management, but they have also received further training in the sub-subspecialty of Interventional Pain Management, in order to become Board-certified as INTERVENTIONAL PAIN MANAGEMENT SPECIALIST.    Mission: Our goal is to use our skills in  INTERVENTIONAL PAIN MANAGEMENT as alternatives to the chronic use of prescription opioid medications for the treatment of pain. To make this more clear, we have changed our name to reflect what we do and offer. We will continue to offer medication management assessment and recommendations, but we will not be taking over any patient's medication management.  ____________________________________________________________________________________________   _______________________________________________________________________  Medication Rules  Purpose: To inform patients, and their family members, of our medication rules and regulations.  Applies to: All patients receiving prescriptions from our practice (written or electronic).  Pharmacy of record: This is the pharmacy where your electronic prescriptions will be sent.   Make sure we have the correct one.  Electronic prescriptions: In compliance with the [place] Strengthen Opioid Misuse Prevention (STOP) Act of 2017 (Session Law 2017-74/H243), effective August 03, 2018, all controlled substances must be electronically prescribed. Written prescriptions, faxing, or calling prescriptions to a pharmacy will no longer be done.  Prescription refills: These will be provided only during in-person appointments. No medications will be renewed without a "face-to-face" evaluation with your provider. Applies to all prescriptions.  NOTE: The following applies primarily to controlled substances (Opioid* Pain Medications).   Type of encounter (visit): For patients receiving controlled substances, face-to-face visits are required. (Not an option and not up to the patient.)  Patient's responsibilities: Pain Pills: Bring all pain pills to every appointment (except for procedure appointments). Pill Bottles: Bring pills in original pharmacy bottle. Bring bottle, even if empty. Always bring the bottle of the most recent fill.  Medication refills: You are responsible for knowing and keeping track of what medications you are taking and when is it that you will need a refill. The day before your appointment: write a list of all prescriptions that need to be refilled. The day of the appointment: give the list to the admitting nurse. Prescriptions will be written only during appointments. No prescriptions will be written on procedure days. If you forget a medication: it will not be "Called in", "Faxed", or  "electronically sent". You will need to get another appointment to get these prescribed. No early refills. Do not call asking to have your prescription filled early. Partial  or short prescriptions: Occasionally your pharmacy may not have enough pills to fill your prescription.  NEVER ACCEPT a partial fill or a prescription that is short of the total amount of pills that you were prescribed.  With controlled substances the law allows 72 hours for the pharmacy to complete the prescription.  If the prescription is not completed within 72 hours, the pharmacist will require a new prescription to be written. This means that you will be short on your medicine and we WILL NOT send another prescription to complete your original prescription.  Instead, request the pharmacy to send a carrier to a nearby branch to get enough medication to provide you with your full prescription. Prescription Accuracy: You are responsible for carefully inspecting your prescriptions before leaving our office. Have the discharge nurse carefully go over each prescription with you, before taking them home. Make sure that your name is accurately spelled, that your address is correct. Check the name and dose of your medication to make sure it is accurate. Check the number of pills, and the written instructions to make sure they are clear and accurate. Make sure that you are given enough medication to last until your next medication refill appointment. Taking Medication: Take medication as prescribed. When it comes to controlled substances, taking less pills or less frequently than prescribed is permitted and encouraged. Never take more pills than instructed. Never take the medication more frequently than prescribed.  Inform other Doctors: Always inform, all of your healthcare providers, of all the medications you take. Pain Medication from other Providers: You are not allowed to accept any additional pain medication from any other Doctor or  Healthcare provider. There are two exceptions to this rule. (see below) In the event that you require additional pain medication, you are responsible for notifying us, as stated below. Cough Medicine: Often these contain an opioid, such as codeine or hydrocodone. Never accept or take cough medicine containing these opioids if   you are already taking an opioid* medication. The combination may cause respiratory failure and death. Medication Agreement: You are responsible for carefully reading and following our Medication Agreement. This must be signed before receiving any prescriptions from our practice. Safely store a copy of your signed Agreement. Violations to the Agreement will result in no further prescriptions. (Additional copies of our Medication Agreement are available upon request.) Laws, Rules, & Regulations: All patients are expected to follow all Federal and State Laws, Statutes, Rules, & Regulations. Ignorance of the Laws does not constitute a valid excuse.  Illegal drugs and Controlled Substances: The use of illegal substances (including, but not limited to marijuana and its derivatives) and/or the illegal use of any controlled substances is strictly prohibited. Violation of this rule may result in the immediate and permanent discontinuation of any and all prescriptions being written by our practice. The use of any illegal substances is prohibited. Adopted CDC guidelines & recommendations: Target dosing levels will be at or below 60 MME/day. Use of benzodiazepines** is not recommended.  Exceptions: There are only two exceptions to the rule of not receiving pain medications from other Healthcare Providers. Exception #1 (Emergencies): In the event of an emergency (i.e.: accident requiring emergency care), you are allowed to receive additional pain medication. However, you are responsible for: As soon as you are able, call our office [phone], at any time of the day or night, and leave a  message stating your name, the date and nature of the emergency, and the name and dose of the medication prescribed. In the event that your call is answered by a member of our staff, make sure to document and save the date, time, and the name of the person that took your information.  Exception #2 (Planned Surgery): In the event that you are scheduled by another doctor or dentist to have any type of surgery or procedure, you are allowed (for a period no longer than 30 days), to receive additional pain medication, for the acute post-op pain. However, in this case, you are responsible for picking up a copy of our "Post-op Pain Management for Surgeons" handout, and giving it to your surgeon or dentist. This document is available at our office, and does not require an appointment to obtain it. Simply go to our office during business hours (Monday-Thursday from 8:00 AM to 4:00 PM) (Friday 8:00 AM to 12:00 Noon) or if you have a scheduled appointment with us, prior to your surgery, and ask for it by name. In addition, you are responsible for: calling our office [phone], at any time of the day or night, and leaving a message stating your name, name of your surgeon, type of surgery, and date of procedure or surgery. Failure to comply with your responsibilities may result in termination of therapy involving the controlled substances. Medication Agreement Violation. Following the above rules, including your responsibilities will help you in avoiding a Medication Agreement Violation ("Breaking your Pain Medication Contract").  Consequences:  Not following the above rules may result in permanent discontinuation of medication prescription therapy.  *Opioid medications include: morphine, codeine, oxycodone, oxymorphone, hydrocodone, hydromorphone, meperidine, tramadol, tapentadol, buprenorphine, fentanyl, methadone. **Benzodiazepine medications include: diazepam (Valium), alprazolam (Xanax), clonazepam (Klonopine),  lorazepam (Ativan), clorazepate (Tranxene), chlordiazepoxide (Librium), estazolam (Prosom), oxazepam (Serax), temazepam (Restoril), triazolam (Halcion) (Last updated: 05/26/2022) ______________________________________________________________________   ______________________________________________________________________  Medication Recommendations and Reminders  Applies to: All patients receiving prescriptions (written and/or electronic).  Medication Rules & Regulations: You are responsible for reading, knowing, and   following our "Medication Rules" document. These exist for your safety and that of others. They are not flexible and neither are we. Dismissing or ignoring them is an act of "non-compliance" that may result in complete and irreversible termination of such medication therapy. For safety reasons, "non-compliance" will not be tolerated. As with the [place] fundamental legal principle of "ignorance of the law is no defense", we will accept no excuses for not having read and knowing the content of documents provided to you by our practice.  Pharmacy of record:  Definition: This is the pharmacy where your electronic prescriptions will be sent.  We do not endorse any particular pharmacy. It is up to you and your insurance to decide what pharmacy to use.  We do not restrict you in your choice of pharmacy. However, once we write for your prescriptions, we will NOT be re-sending more prescriptions to fix restricted supply problems created by your pharmacy, or your insurance.  The pharmacy listed in the electronic medical record should be the one where you want electronic prescriptions to be sent. If you choose to change pharmacy, simply notify our nursing staff. Changes will be made only during your regular appointments and not over the phone.  Recommendations: Keep all of your pain medications in a safe place, under lock and key, even if you live alone. We will NOT replace lost, stolen, or  damaged medication. We do not accept "Police Reports" as proof of medications having been stolen. After you fill your prescription, take 1 week's worth of pills and put them away in a safe place. You should keep a separate, properly labeled bottle for this purpose. The remainder should be kept in the original bottle. Use this as your primary supply, until it runs out. Once it's gone, then you know that you have 1 week's worth of medicine, and it is time to come in for a prescription refill. If you do this correctly, it is unlikely that you will ever run out of medicine. To make sure that the above recommendation works, it is very important that you make sure your medication refill appointments are scheduled at least 1 week before you run out of medicine. To do this in an effective manner, make sure that you do not leave the office without scheduling your next medication management appointment. Always ask the nursing staff to show you in your prescription , when your medication will be running out. Then arrange for the receptionist to get you a return appointment, at least 7 days before you run out of medicine. Do not wait until you have 1 or 2 pills left, to come in. This is very poor planning and does not take into consideration that we may need to cancel appointments due to bad weather, sickness, or emergencies affecting our staff. DO NOT ACCEPT A "Partial Fill": If for any reason your pharmacy does not have enough pills/tablets to completely fill or refill your prescription, do not allow for a "partial fill". The law allows the pharmacy to complete that prescription within 72 hours, without requiring a new prescription. If they do not fill the rest of your prescription within those 72 hours, you will need a separate prescription to fill the remaining amount, which we will NOT provide. If the reason for the partial fill is your insurance, you will need to talk to the pharmacist about payment alternatives for  the remaining tablets, but again, DO NOT ACCEPT A PARTIAL FILL, unless you can trust your pharmacist to obtain   the remainder of the pills within 72 hours.  Prescription refills and/or changes in medication(s):  Prescription refills, and/or changes in dose or medication, will be conducted only during scheduled medication management appointments. (Applies to both, written and electronic prescriptions.) No refills on procedure days. No medication will be changed or started on procedure days. No changes, adjustments, and/or refills will be conducted on a procedure day. Doing so will interfere with the diagnostic portion of the procedure. No phone refills. No medications will be "called into the pharmacy". No Fax refills. No weekend refills. No Holliday refills. No after hours refills.  Remember:  Business hours are:  Monday to Thursday 8:00 AM to 4:00 PM Provider's Schedule: Alilah Mcmeans, MD - Appointments are:  Medication management: Monday and Wednesday 8:00 AM to 4:00 PM Procedure day: Tuesday and Thursday 7:30 AM to 4:00 PM Bilal Lateef, MD - Appointments are:  Medication management: Tuesday and Thursday 8:00 AM to 4:00 PM Procedure day: Monday and Wednesday 7:30 AM to 4:00 PM (Last update: 05/26/2022) ______________________________________________________________________  ____________________________________________________________________________________________  Pharmacy Shortages of Pain Medication   Introduction Shockingly as it may seem, .  "No U.[place] Supreme Court decision has ever interpreted the Constitution as guaranteeing a right to health care for all Americans." - https://www.healthequityandpolicylab.com/elusive-right-to-health-care-under-us-law  "With respect to human rights, the [place] has no formally codified right to health, nor does it participate in a human rights treaty that specifies a right to health." - Scott J. Schweikart, JD, MBE  Situation By  now, most of our patients have had the experience of being told by their pharmacist that they do not have enough medication to cover their prescription. If you have not had this experience, just know that you soon will.  Problem There appears to be a shortage of these medications, either at the national level or locally. This is happening with all pharmacies. When there is not enough medication, patients are offered a partial fill and they are told that they will try to get the rest of the medicine for them at a later time. If they do not have enough for even a partial fill, the pharmacists are telling the patients to call us (the prescribing physicians) to request that we send another prescription to another pharmacy to get the medicine.   This reordering of a controlled substance creates documentation problems where additional paperwork needs to be created to explain why two prescriptions for the same period of time and the same medicine are being prescribed to the same patient. It also creates situations where the last appointment note does not accurately reflect when and what prescriptions were given to a patient. This leads to prescribing errors down the line, in subsequent follow-up visits.   [place] Board of Pharmacy ([place]BOP) Research revealed that Board of Pharmacy Rule .1806 (21 NCAC 46.1806) authorizes pharmacists to the transfer of prescriptions among pharmacies, and it sets forth procedural and recordkeeping requirements for doing so. However, this requires the pharmacist to complete the previously mentioned procedural paperwork to accomplish the transfer. As it turns out, it is much easier for them to have the prescribing physicians do the work.   Possible solutions 1. You can ask your physician to assist you in weaning yourself off these medications. 2. Ask your pharmacy if the medication is in stock, 3 days prior to your refill. 3. If you need a pharmacy change, let us know at your  medication management visit. Prescriptions that have already been electronically sent to a pharmacy will   not be re-sent to a different pharmacy if your pharmacy of record does not have it in stock. Proper stocking of medication is a pharmacy problem, not a prescriber problem. Work with your pharmacist to solve the problem. 4. Have the [place] Assembly add a provision to the "STOP ACT" (the law that mandates how controlled substances are prescribed) where there is an exception to the electronic prescribing rule that states that in the event there are shortages of medications the physicians are allowed to use written prescriptions as opposed to electronic ones. This would allow patients to take their prescriptions to a different pharmacy that may have enough medication available to fill the prescription. The problem is that currently there is a law that does not allow for written prescriptions, with the exception of instances where the electronic medical record is down due to technical issues.  5. Have [place] Congress ease the pressure on pharmaceutical companies, allowing them to produce enough quantities of the medication to adequately supply the population. 6. Have pharmacies keep enough stocks of these medications to cover their client base.  7. Have the [place] Assembly add a provision to the "STOP ACT" where they ease the regulations surrounding the transfer of controlled substances between pharmacies, so as to simplify the transfer of supplies. As an alternative, develop a system to allow patients to obtain the remainder of their prescription at another one of their pharmacies or at an associate pharmacy.   How this shortage will affect you.  Understand that this is a pharmacy supply problem, not a prescriber problem. Work with your pharmacy to solve it. The job of the prescriber is to evaluate and monitor the patient for the appropriate indications and use of these medicines. It is  not the job of the prescriber to supply the medication or to solve problems with that supply. The responsibility and the choice to obtain the medication resides on the patient. By law, supplying the medication is the job of the pharmacy. It is certainly not the job of the prescriber to solve supply problems.   Due to the above problems we are no longer taking patients to write for their pain medication. Future discussions with your physician may include potentially weaning medications or transitioning to alternatives.  We will be focusing primarily on interventional based pain management. We will continue to evaluate for appropriate indications and we may provide recommendations regarding medication, dose, and schedule, as well as monitoring recommendations, however, we will not be taking over the actual prescribing of these substances. On those patients where we are treating their chronic pain with interventional therapies, exceptions will be considered on a case by case basis. At this time, we will try to continue providing this supplemental service to those patients we have been managing in the past. However, as of August 1st, 2023, we no longer will be sending additional prescriptions to other pharmacies for the purpose of solving their supply problems. Once we send a prescription to a pharmacy, we will not be resending it again to another pharmacy to cover for their shortages.   What to do. Write as many letters as you can. Recruit the help of family members in writing these letters. Below are some of the places where you can write to make your voice heard. Let them know what the problem is and push them to look for solutions.   Search internet for: "[place] find your legislators" https://www.ncleg.gov/findyourlegislators  Search internet for: " insurance commissioner   complaints" https://www.ncdoi.gov/contactscomplaints/assistance-or-file-complaint  Search internet for: "[place] Board of Pharmacy complaints" http://www.ncbop.org/contact.htm  Search internet for: "[facility] pharmacy complaints" Email [facility] Customer Relations https://www.[facility].com/help/email-[email or web address].jsp?callType=store  Search internet for: "[facility] pharmacy customer service complaints" https://www.[facility].com/topic/marketing/contactus/contactus_customerservice.jsp  ____________________________________________________________________________________________  ____________________________________________________________________________________________  Drug Holidays (Slow)  What is a "Drug Holiday"? Drug Holiday: is the name given to the period of time during which a patient stops taking a medication(s) for the purpose of eliminating tolerance to the drug.  Benefits Improved effectiveness of opioids. Decreased opioid dose needed to achieve benefits. Improved pain with lesser dose.  What is tolerance? Tolerance: is the progressive decreased in effectiveness of a drug due to its repetitive use. With repetitive use, the body gets use to the medication and as a consequence, it loses its effectiveness. This is a common problem seen with opioid pain medications. As a result, a larger dose of the drug is needed to achieve the same effect that used to be obtained with a smaller dose.  How long should a "Drug Holiday" last? You should stay off of the pain medicine for at least 14 consecutive days. (2 weeks)  Should I stop the medicine "cold turkey"? No. You should always coordinate with your Pain Specialist so that he/she can provide you with the correct medication dose to make the transition as smoothly as possible.  How do I stop the medicine? Slowly. You will be instructed to decrease the daily amount of pills that you take by one (1) pill every seven (7) days. This is called a "slow downward taper" of your dose. For example: if you normally take four (4) pills per day, you will  be asked to drop this dose to three (3) pills per day for seven (7) days, then to two (2) pills per day for seven (7) days, then to one (1) per day for seven (7) days, and at the end of those last seven (7) days, this is when the "Drug Holiday" would start.   Will I have withdrawals? By doing a "slow downward taper" like this one, it is unlikely that you will experience any significant withdrawal symptoms. Typically, what triggers withdrawals is the sudden stop of a high dose opioid therapy. Withdrawals can usually be avoided by slowly decreasing the dose over a prolonged period of time. If you do not follow these instructions and decide to stop your medication abruptly, withdrawals may be possible.  What are withdrawals? Withdrawals: refers to the wide range of symptoms that occur after stopping or dramatically reducing opiate drugs after heavy and prolonged use. Withdrawal symptoms do not occur to patients that use low dose opioids, or those who take the medication sporadically. Contrary to benzodiazepine (example: Valium, Xanax, etc.) or alcohol withdrawals ("Delirium Tremens"), opioid withdrawals are not lethal. Withdrawals are the physical manifestation of the body getting rid of the excess receptors.  Expected Symptoms Early symptoms of withdrawal may include: Agitation Anxiety Muscle aches Increased tearing Insomnia Runny nose Sweating Yawning  Late symptoms of withdrawal may include: Abdominal cramping Diarrhea Dilated pupils Goose bumps Nausea Vomiting  Will I experience withdrawals? Due to the slow nature of the taper, it is very unlikely that you will experience any.  What is a slow taper? Taper: refers to the gradual decrease in dose.  (Last update: 02/21/2020) ____________________________________________________________________________________________   ____________________________________________________________________________________________  CBD (cannabidiol) &  Delta-8 (Delta-8 tetrahydrocannabinol) WARNING  Intro: Cannabidiol (CBD) and tetrahydrocannabinol (THC), are two natural compounds found in plants of the Cannabis genus. They can both be extracted   from hemp or cannabis. Hemp and cannabis come from the Cannabis sativa plant. Both compounds interact with your body's endocannabinoid system, but they have very different effects. CBD does not produce the high sensation associated with cannabis. Delta-8 tetrahydrocannabinol, also known as delta-8 THC, is a psychoactive substance found in the Cannabis sativa plant, of which marijuana and hemp are two varieties. THC is responsible for the high associated with the illicit use of marijuana.  Applicable to: All individuals currently taking or considering taking CBD (cannabidiol) and, more important, all patients taking opioid analgesic controlled substances (pain medication). (Example: oxycodone; oxymorphone; hydrocodone; hydromorphone; morphine; methadone; tramadol; tapentadol; fentanyl; buprenorphine; butorphanol; dextromethorphan; meperidine; codeine; etc.)  Legal status: CBD remains a Schedule I drug prohibited for any use. CBD is illegal with one exception. In the [place], CBD has a limited Food and Drug Administration (FDA) approval for the treatment of two specific types of epilepsy disorders. Only one CBD product has been approved by the FDA for this purpose: "Epidiolex". FDA is aware that some companies are marketing products containing cannabis and cannabis-derived compounds in ways that violate the Federal Food, Drug and Cosmetic Act (FD&C Act) and that may put the health and safety of consumers at risk. The FDA, a Federal agency, has not enforced the CBD status since 2018. UPDATE: (09/19/2021) The Drug Enforcement Agency (DEA) issued a letter stating that "delta" cannabinoids, including Delta-8-THCO and Delta-9-THCO, synthetically derived from hemp do not qualify as hemp and will be viewed as Schedule I  drugs. (Schedule I drugs, substances, or chemicals are defined as drugs with no currently accepted medical use and a high potential for abuse. Some examples of Schedule I drugs are: heroin, lysergic acid diethylamide (LSD), marijuana (cannabis), 3,4-methylenedioxymethamphetamine (ecstasy), methaqualone, and peyote.) (https://www.dea.gov)  Legality: Some manufacturers ship CBD products nationally, which is illegal. Often such products are sold online and are therefore available throughout the country. CBD is openly sold in head shops and health food stores in some states where such sales have not been explicitly legalized. Selling unapproved products with unsubstantiated therapeutic claims is not only a violation of the law, but also can put patients at risk, as these products have not been proven to be safe or effective. Federal illegality makes it difficult to conduct research on CBD.  Reference: "FDA Regulation of Cannabis and Cannabis-Derived Products, Including Cannabidiol (CBD)" - https://www.fda.gov/news-events/public-health-focus/fda-regulation-cannabis-and-cannabis-derived-products-including-cannabidiol-cbd  Warning: CBD is not FDA approved and has not undergo the same manufacturing controls as prescription drugs.  This means that the purity and safety of available CBD may be questionable. Most of the time, despite manufacturer's claims, it is contaminated with THC (delta-9-tetrahydrocannabinol - the chemical in marijuana responsible for the "HIGH").  When this is the case, the THC contaminant will trigger a positive urine drug screen (UDS) test for Marijuana (carboxy-THC). Because a positive UDS for any illicit substance is a violation of our medication agreement, your opioid analgesics (pain medicine) may be permanently discontinued. The FDA recently put out a warning about 5 things that everyone should be aware of regarding Delta-8 THC: Delta-8 THC products have not been evaluated or approved by  the FDA for safe use and may be marketed in ways that put the public health at risk. The FDA has received adverse event reports involving delta-8 THC-containing products. Delta-8 THC has psychoactive and intoxicating effects. Delta-8 THC manufacturing often involve use of potentially harmful chemicals to create the concentrations of delta-8 THC claimed in the marketplace. The final delta-8 THC product may have potentially   harmful by-products (contaminants) due to the chemicals used in the process. Manufacturing of delta-8 THC products may occur in uncontrolled or unsanitary settings, which may lead to the presence of unsafe contaminants or other potentially harmful substances. Delta-8 THC products should be kept out of the reach of children and pets.  MORE ABOUT CBD  General Information: CBD was discovered in 1940 and it is a derivative of the cannabis sativa genus plants (Marijuana and Hemp). It is one of the 113 identified substances found in Marijuana. It accounts for up to 40% of the plant's extract. As of 2018, preliminary clinical studies on CBD included research for the treatment of anxiety, movement disorders, and pain. CBD is available and consumed in multiple forms, including inhalation of smoke or vapor, as an aerosol spray, and by mouth. It may be supplied as an oil containing CBD, capsules, dried cannabis, or as a liquid solution. CBD is thought not to be as psychoactive as THC (delta-9-tetrahydrocannabinol - the chemical in marijuana responsible for the "HIGH"). Studies suggest that CBD may interact with different biological target receptors in the body, including cannabinoid and other neurotransmitter receptors. As of 2018 the mechanism of action for its biological effects has not been determined.  Side-effects  Adverse reactions: Dry mouth, diarrhea, decreased appetite, fatigue, drowsiness, malaise, weakness, sleep disturbances, and others.  Drug interactions: CBC may interact with other  medications such as blood-thinners. Because CBD causes drowsiness on its own, it also increases the drowsiness caused by other medications, including antihistamines (such as Benadryl), benzodiazepines (Xanax, Ativan, Valium), antipsychotics, antidepressants and opioids, as well as alcohol and supplements such as kava, melatonin and St. John's Wort. Be cautious with the following combinations:   Brivaracetam (Briviact) Brivaracetam is changed and broken down by the body. CBD might decrease how quickly the body breaks down brivaracetam. This might increase levels of brivaracetam in the body.  Caffeine Caffeine is changed and broken down by the body. CBD might decrease how quickly the body breaks down caffeine. This might increase levels of caffeine in the body.  Carbamazepine (Tegretol) Carbamazepine is changed and broken down by the body. CBD might decrease how quickly the body breaks down carbamazepine. This might increase levels of carbamazepine in the body and increase its side effects.  Citalopram (Celexa) Citalopram is changed and broken down by the body. CBD might decrease how quickly the body breaks down citalopram. This might increase levels of citalopram in the body and increase its side effects.  Clobazam (Onfi) Clobazam is changed and broken down by the liver. CBD might decrease how quickly the liver breaks down clobazam. This might increase the effects and side effects of clobazam.  Eslicarbazepine (Aptiom) Eslicarbazepine is changed and broken down by the body. CBD might decrease how quickly the body breaks down eslicarbazepine. This might increase levels of eslicarbazepine in the body by a small amount.  Everolimus (Zostress) Everolimus is changed and broken down by the body. CBD might decrease how quickly the body breaks down everolimus. This might increase levels of everolimus in the body.  Lithium Taking higher doses of CBD might increase levels of lithium. This can increase  the risk of lithium toxicity.  Medications changed by the liver (Cytochrome P450 1A1 (CYP1A1) substrates) Some medications are changed and broken down by the liver. CBD might change how quickly the liver breaks down these medications. This could change the effects and side effects of these medications.  Medications changed by the liver (Cytochrome P450 1A2 (CYP1A2) substrates) Some medications are   changed and broken down by the liver. CBD might change how quickly the liver breaks down these medications. This could change the effects and side effects of these medications.  Medications changed by the liver (Cytochrome P450 1B1 (CYP1B1) substrates) Some medications are changed and broken down by the liver. CBD might change how quickly the liver breaks down these medications. This could change the effects and side effects of these medications.  Medications changed by the liver (Cytochrome P450 2A6 (CYP2A6) substrates) Some medications are changed and broken down by the liver. CBD might change how quickly the liver breaks down these medications. This could change the effects and side effects of these medications.  Medications changed by the liver (Cytochrome P450 2B6 (CYP2B6) substrates) Some medications are changed and broken down by the liver. CBD might change how quickly the liver breaks down these medications. This could change the effects and side effects of these medications.  Medications changed by the liver (Cytochrome P450 2C19 (CYP2C19) substrates) Some medications are changed and broken down by the liver. CBD might change how quickly the liver breaks down these medications. This could change the effects and side effects of these medications.  Medications changed by the liver (Cytochrome P450 2C8 (CYP2C8) substrates) Some medications are changed and broken down by the liver. CBD might change how quickly the liver breaks down these medications. This could change the effects and side effects  of these medications.  Medications changed by the liver (Cytochrome P450 2C9 (CYP2C9) substrates) Some medications are changed and broken down by the liver. CBD might change how quickly the liver breaks down these medications. This could change the effects and side effects of these medications.  Medications changed by the liver (Cytochrome P450 2D6 (CYP2D6) substrates) Some medications are changed and broken down by the liver. CBD might change how quickly the liver breaks down these medications. This could change the effects and side effects of these medications.  Medications changed by the liver (Cytochrome P450 2E1 (CYP2E1) substrates) Some medications are changed and broken down by the liver. CBD might change how quickly the liver breaks down these medications. This could change the effects and side effects of these medications.  Medications changed by the liver (Cytochrome P450 3A4 (CYP3A4) substrates) Some medications are changed and broken down by the liver. CBD might change how quickly the liver breaks down these medications. This could change the effects and side effects of these medications.  Medications changed by the liver (Glucuronidated drugs) Some medications are changed and broken down by the liver. CBD might change how quickly the liver breaks down these medications. This could change the effects and side effects of these medications.  Medications that decrease the breakdown of other medications by the liver (Cytochrome P450 2C19 (CYP2C19) inhibitors) CBD is changed and broken down by the liver. Some drugs decrease how quickly the liver changes and breaks down CBD. This could change the effects and side effects of CBD.  Medications that decrease the breakdown of other medications in the liver (Cytochrome P450 3A4 (CYP3A4) inhibitors) CBD is changed and broken down by the liver. Some drugs decrease how quickly the liver changes and breaks down CBD. This could change the effects  and side effects of CBD.  Medications that increase breakdown of other medications by the liver (Cytochrome P450 3A4 (CYP3A4) inducers) CBD is changed and broken down by the liver. Some drugs increase how quickly the liver changes and breaks down CBD. This could change the effects and side effects of   CBD.  Medications that increase the breakdown of other medications by the liver (Cytochrome P450 2C19 (CYP2C19) inducers) CBD is changed and broken down by the liver. Some drugs increase how quickly the liver changes and breaks down CBD. This could change the effects and side effects of CBD.  Methadone (Dolophine) Methadone is broken down by the liver. CBD might decrease how quickly the liver breaks down methadone. Taking cannabidiol along with methadone might increase the effects and side effects of methadone.  Rufinamide (Banzel) Rufinamide is changed and broken down by the body. CBD might decrease how quickly the body breaks down rufinamide. This might increase levels of rufinamide in the body by a small amount.  Sedative medications (CNS depressants) CBD might cause sleepiness and slowed breathing. Some medications, called sedatives, can also cause sleepiness and slowed breathing. Taking CBD with sedative medications might cause breathing problems and/or too much sleepiness.  Sirolimus (Rapamune) Sirolimus is changed and broken down by the body. CBD might decrease how quickly the body breaks down sirolimus. This might increase levels of sirolimus in the body.  Stiripentol (Diacomit) Stiripentol is changed and broken down by the body. CBD might decrease how quickly the body breaks down stiripentol. This might increase levels of stiripentol in the body and increase its side effects.  Tacrolimus (Prograf) Tacrolimus is changed and broken down by the body. CBD might decrease how quickly the body breaks down tacrolimus. This might increase levels of tacrolimus in the body.  Tamoxifen  (Soltamox) Tamoxifen is changed and broken down by the body. CBD might affect how quickly the body breaks down tamoxifen. This might affect levels of tamoxifen in the body.  Topiramate (Topamax) Topiramate is changed and broken down by the body. CBD might decrease how quickly the body breaks down topiramate. This might increase levels of topiramate in the body by a small amount.  Valproate Valproic acid can cause liver injury. Taking cannabidiol with valproic acid might increase the chance of liver injury. CBD and/or valproic acid might need to be stopped, or the dose might need to be reduced.  Warfarin (Coumadin) CBD might increase levels of warfarin, which can increase the risk for bleeding. CBD and/or warfarin might need to be stopped, or the dose might need to be reduced.  Zonisamide Zonisamide is changed and broken down by the body. CBD might decrease how quickly the body breaks down zonisamide. This might increase levels of zonisamide in the body by a small amount. (Last update: 10/01/2021) ____________________________________________________________________________________________   

## 2022-06-22 NOTE — Progress Notes (Signed)
Nursing Pain Medication Assessment:  Safety precautions to be maintained throughout the outpatient stay will include: orient to surroundings, keep bed in low position, maintain call bell within reach at all times, provide assistance with transfer out of bed and ambulation.  Medication Inspection Compliance: Pill count conducted under aseptic conditions, in front of the patient. Neither the pills nor the bottle was removed from the patient's sight at any time. Once count was completed pills were immediately returned to the patient in their original bottle.  Medication: Oxycodone IR Pill/Patch Count:  67 of 150 pills remain Pill/Patch Appearance: Markings consistent with prescribed medication Bottle Appearance: Standard pharmacy container. Clearly labeled. Filled Date: 54 / 04 / 2023 Last Medication intake:  Today

## 2022-08-03 ENCOUNTER — Emergency Department (HOSPITAL_COMMUNITY): Payer: Medicare Other

## 2022-08-03 ENCOUNTER — Other Ambulatory Visit: Payer: Self-pay

## 2022-08-03 ENCOUNTER — Encounter (HOSPITAL_COMMUNITY): Payer: Self-pay | Admitting: Pharmacy Technician

## 2022-08-03 ENCOUNTER — Emergency Department (HOSPITAL_COMMUNITY)
Admission: EM | Admit: 2022-08-03 | Discharge: 2022-08-03 | Disposition: A | Discharge status: Home / Self Care | Payer: Medicare Other | Attending: Emergency Medicine | Admitting: Emergency Medicine

## 2022-08-03 DIAGNOSIS — R519 Headache, unspecified: Secondary | ICD-10-CM | POA: Insufficient documentation

## 2022-08-03 DIAGNOSIS — Z7902 Long term (current) use of antithrombotics/antiplatelets: Secondary | ICD-10-CM | POA: Diagnosis not present

## 2022-08-03 DIAGNOSIS — I2510 Atherosclerotic heart disease of native coronary artery without angina pectoris: Secondary | ICD-10-CM | POA: Insufficient documentation

## 2022-08-03 DIAGNOSIS — M542 Cervicalgia: Secondary | ICD-10-CM | POA: Diagnosis present

## 2022-08-03 DIAGNOSIS — G8929 Other chronic pain: Secondary | ICD-10-CM | POA: Diagnosis not present

## 2022-08-03 DIAGNOSIS — Z79899 Other long term (current) drug therapy: Secondary | ICD-10-CM | POA: Diagnosis not present

## 2022-08-03 DIAGNOSIS — Z7982 Long term (current) use of aspirin: Secondary | ICD-10-CM | POA: Diagnosis not present

## 2022-08-03 MED ORDER — LIDOCAINE 5 % EX PTCH
1.0000 | MEDICATED_PATCH | Freq: Once | CUTANEOUS | Status: DC
  Administered 2022-08-03: 1 via TRANSDERMAL
  Filled 2022-08-03: qty 1

## 2022-08-03 MED ORDER — LIDOCAINE 5 % EX PTCH: 1.0000 | MEDICATED_PATCH | CUTANEOUS | 0 refills | Status: DC

## 2022-08-03 MED ORDER — OXYCODONE HCL 5 MG PO TABS
10.0000 mg | ORAL_TABLET | Freq: Once | ORAL | Status: AC
  Administered 2022-08-03: 10 mg via ORAL
  Filled 2022-08-03: qty 2

## 2022-08-03 NOTE — ED Provider Notes (Signed)
[facility] EMERGENCY DEPARTMENT Provider Note   CSN: [id number] Arrival date & time: 08/03/22  1107     History  Chief Complaint  Patient presents with   Headache   Neck Pain    [name] [name] is a 72 y.o. male.  The history is provided by the patient, the spouse and medical records. No language interpreter was used.  Headache Associated symptoms: neck pain   Neck Pain Associated symptoms: headaches      72 year old male with known history of chronic neck pain, occipital neuralgia, chronic pain syndrome, opioid dependency, gout, CAD with cardiac stenting currently on Plavix who presents with complaints of neck pain.  Patient report for the past 3 days he has had headache, neck pain, and pain radiates to both of his shoulders.  His pain is sharp and achy felt similar to his chronic pain but is getting progressively worse.  Despite taking his home medication he report inadequate improvement of his symptoms.  He does not endorse any fever chills no runny nose sneezing or coughing no confusion no vision changes denies any focal numbness or focal weakness.  Denies any rash.  No recent trauma.  He has been seen evaluate multiple times for this in the past.  He does follow-up with a pain specialist.  Home Medications Prior to Admission medications   Medication Sig Start Date End Date Taking? Authorizing Provider  aspirin 81 MG tablet Take 1 tablet (81 mg total) by mouth daily. 08/16/17   Sherren Mocha, MD  atorvastatin (LIPITOR) 20 MG tablet TAKE 1 TABLET BY MOUTH EVERY DAY 05/10/20   Sherren Mocha, MD  Cholecalciferol (VITAMIN D3) 50 MCG (2000 UT) capsule Take 1 capsule (2,000 Units total) by mouth daily. 06/26/20   Milinda Pointer, MD  clopidogrel (PLAVIX) 75 MG tablet TAKE 1 TABLET BY MOUTH EVERY DAY 12/15/17   Sherren Mocha, MD  hydrochlorothiazide (HYDRODIURIL) 25 MG tablet Take 25 mg by mouth. 07/03/20   [provider]  isosorbide mononitrate (IMDUR)  30 MG 24 hr tablet Take 1 tablet (30 mg total) by mouth daily. 04/01/22   Sherren Mocha, MD  magnesium oxide (MAG-OX) 400 MG tablet Take 1 tablet (400 mg total) by mouth 2 (two) times daily. 06/26/20 06/13/22  Milinda Pointer, MD  metoprolol succinate (TOPROL-XL) 50 MG 24 hr tablet TAKE WITH OR IMMEDIATELY FOLLOWING A MEAL. 07/03/20   Sherren Mocha, MD  naloxone [facility]) nasal spray 4 mg/0.1 mL Place 1 spray into the nose as needed for up to 365 doses (for opioid-induced respiratory depresssion). In case of emergency (overdose), spray once into each nostril. If no response within 3 minutes, repeat application and call [phone]. 12/31/21 12/31/22  Milinda Pointer, MD  nitroGLYCERIN (NITROSTAT) 0.4 MG SL tablet PLACE 1 TABLET (0.4 MG TOTAL) UNDER THE TONGUE EVERY 5 (FIVE) MINUTES AS NEEDED FOR CHEST PAIN. 10/15/20   Sherren Mocha, MD  Oxycodone HCl 10 MG TABS Take 1 tablet (10 mg total) by mouth 5 (five) times daily. Must last 30 days. 06/29/22 07/29/22  Milinda Pointer, MD  Oxycodone HCl 10 MG TABS Take 1 tablet (10 mg total) by mouth 5 (five) times daily. Must last 30 days. 07/29/22 08/28/22  Milinda Pointer, MD  Oxycodone HCl 10 MG TABS Take 1 tablet (10 mg total) by mouth 5 (five) times daily. Must last 30 days. 08/28/22 09/27/22  Milinda Pointer, MD  pantoprazole (PROTONIX) 40 MG tablet TAKE 1 TABLET BY MOUTH EVERY DAY 11/07/18   Sherren Mocha, MD  spironolactone (ALDACTONE) 25 MG tablet Take 0.5 tablets (12.5 mg total) by mouth daily. 04/01/22   Sherren Mocha, MD  vitamin B-12 (CYANOCOBALAMIN) 1000 MCG tablet Take 2,000 mcg by mouth daily.    [provider]      Allergies    Gabapentin    Review of Systems   Review of Systems  Musculoskeletal:  Positive for neck pain.  Neurological:  Positive for headaches.  All other systems reviewed and are negative.   Physical Exam Updated Vital Signs BP 123/81   Pulse 77   Temp 97.9 F (36.6 C) (Oral)   Resp 16   SpO2 97%   Physical Exam Vitals and nursing note reviewed.  Constitutional:      General: He is not in acute distress.    Appearance: He is well-developed.  HENT:     Head: Normocephalic and atraumatic.  Eyes:     Conjunctiva/sclera: Conjunctivae normal.  Neck:     Comments: Tenderness along cervical spine with decreased neck range of motion but no nuchal rigidity. Musculoskeletal:     Cervical back: Normal range of motion and neck supple.     Comments: 5 out of 5 strength to bilateral upper extremities.  Skin:    Findings: No rash.  Neurological:     Mental Status: He is alert.     ED Results / Procedures / Treatments   Labs (all labs ordered are listed, but only abnormal results are displayed) Labs Reviewed - No data to display  EKG None  Radiology CT Head Wo Contrast  Result Date: 08/03/2022 CLINICAL DATA:  Severe headache for 4 days. The patient is on blood thinners. Neck pain. EXAM: CT HEAD WITHOUT CONTRAST CT CERVICAL SPINE WITHOUT CONTRAST TECHNIQUE: Multidetector CT imaging of the head and cervical spine was performed following the standard protocol without intravenous contrast. Multiplanar CT image reconstructions of the cervical spine were also generated. RADIATION DOSE REDUCTION: This exam was performed according to the departmental dose-optimization program which includes automated exposure control, adjustment of the mA and/or kV according to patient size and/or use of iterative reconstruction technique. COMPARISON:  None MR head without contrast 12/13/2013 at [facility]. MR cervical spine 01/20/2021. FINDINGS: CT HEAD FINDINGS Brain: Periventricular and scattered subcortical white matter hypoattenuation is moderately advanced for age. No acute infarct, hemorrhage, or mass lesion is present. Deep brain nuclei are within normal limits. Chronic ischemic changes are present in the thalami bilaterally, left greater than right. The brainstem and cerebellum are within normal limits.  Vascular: Atherosclerotic calcifications are present within the cavernous internal carotid arteries bilaterally. No hyperdense vessel is present. Skull: Calvarium is intact. No focal lytic or blastic lesions are present. A high left posterior parietal scalp lipoma is present. No other significant extracranial soft tissue lesion is present. Sinuses/Orbits: The paranasal sinuses and mastoid air cells are clear. The globes and orbits are within normal limits. CT CERVICAL SPINE FINDINGS Alignment: No significant listhesis is present. Cervical lordosis is stable. Skull base and vertebrae: Soft tissue pannus formation is present at C1-2. Craniocervical junction otherwise within normal limits. Vertebral body heights are maintained. No acute or healing fractures are present. Soft tissues and spinal canal: Paraspinous soft tissues are within normal limits. Atherosclerotic calcifications are present at the carotid bifurcations. Disc levels: Severe central canal stenosis is again noted at C3-4 and C4-5. More moderate central canal stenoses are present at C5-6 and C6-7. Upper chest: The lung apices are clear. IMPRESSION: 1. No acute intracranial abnormality or significant interval  change. 2. Stable moderate atrophy and white matter disease. This likely reflects the sequela of chronic microvascular ischemia. 3. Chronic ischemic changes in the thalami bilaterally, left greater than right. 4. No acute fracture or traumatic subluxation in the cervical spine. 5. Severe central canal stenosis again noted at C3-4 and C4-5. Patient has known chronic myelomalacia at these levels. 6. More moderate central canal stenosis again noted at C5-6 and C6-7. Electronically Signed   By: San Morelle M.D.   On: 08/03/2022 12:14   CT Cervical Spine Wo Contrast  Result Date: 08/03/2022 CLINICAL DATA:  Severe headache for 4 days. The patient is on blood thinners. Neck pain. EXAM: CT HEAD WITHOUT CONTRAST CT CERVICAL SPINE WITHOUT CONTRAST  TECHNIQUE: Multidetector CT imaging of the head and cervical spine was performed following the standard protocol without intravenous contrast. Multiplanar CT image reconstructions of the cervical spine were also generated. RADIATION DOSE REDUCTION: This exam was performed according to the departmental dose-optimization program which includes automated exposure control, adjustment of the mA and/or kV according to patient size and/or use of iterative reconstruction technique. COMPARISON:  None MR head without contrast 12/13/2013 at [facility]. MR cervical spine 01/20/2021. FINDINGS: CT HEAD FINDINGS Brain: Periventricular and scattered subcortical white matter hypoattenuation is moderately advanced for age. No acute infarct, hemorrhage, or mass lesion is present. Deep brain nuclei are within normal limits. Chronic ischemic changes are present in the thalami bilaterally, left greater than right. The brainstem and cerebellum are within normal limits. Vascular: Atherosclerotic calcifications are present within the cavernous internal carotid arteries bilaterally. No hyperdense vessel is present. Skull: Calvarium is intact. No focal lytic or blastic lesions are present. A high left posterior parietal scalp lipoma is present. No other significant extracranial soft tissue lesion is present. Sinuses/Orbits: The paranasal sinuses and mastoid air cells are clear. The globes and orbits are within normal limits. CT CERVICAL SPINE FINDINGS Alignment: No significant listhesis is present. Cervical lordosis is stable. Skull base and vertebrae: Soft tissue pannus formation is present at C1-2. Craniocervical junction otherwise within normal limits. Vertebral body heights are maintained. No acute or healing fractures are present. Soft tissues and spinal canal: Paraspinous soft tissues are within normal limits. Atherosclerotic calcifications are present at the carotid bifurcations. Disc levels: Severe central canal stenosis is again  noted at C3-4 and C4-5. More moderate central canal stenoses are present at C5-6 and C6-7. Upper chest: The lung apices are clear. IMPRESSION: 1. No acute intracranial abnormality or significant interval change. 2. Stable moderate atrophy and white matter disease. This likely reflects the sequela of chronic microvascular ischemia. 3. Chronic ischemic changes in the thalami bilaterally, left greater than right. 4. No acute fracture or traumatic subluxation in the cervical spine. 5. Severe central canal stenosis again noted at C3-4 and C4-5. Patient has known chronic myelomalacia at these levels. 6. More moderate central canal stenosis again noted at C5-6 and C6-7. Electronically Signed   By: San Morelle M.D.   On: 08/03/2022 12:14    Procedures Procedures    Medications Ordered in ED Medications - No data to display  ED Course/ Medical Decision Making/ A&P                           Medical Decision Making Amount and/or Complexity of Data Reviewed Radiology: ordered.  Risk Prescription drug management.   BP 123/74   Pulse 75   Temp 97.9 F (36.6 C) (Oral)   Resp 14  SpO2 97%   54:83 PM  72 year old male with known history of chronic neck pain, occipital neuralgia, chronic pain syndrome, opioid dependency, gout, CAD with cardiac stenting currently on Plavix who presents with complaints of neck pain.  Patient report for the past 3 days he has had headache, neck pain, and pain radiates to both of his shoulders.  His pain is sharp and achy felt similar to his chronic pain but is getting progressively worse.  Despite taking his home medication he report inadequate improvement of his symptoms.  He does not endorse any fever chills no runny nose sneezing or coughing no confusion no vision changes denies any focal numbness or focal weakness.  Denies any rash.  No recent trauma.  He has been seen evaluate multiple times for this in the past.  He does follow-up with a pain  specialist.  On exam, patient is sitting in wheelchair, slumped over but able to answer questions appropriately.  He does have some tenderness along his cervical spine and with decreased neck range of motion but no nuchal rigidity.  He has equal strength throughout bilateral upper extremities.  Scalp exam unremarkable no signs of trauma.  He is neurovascularly intact.   -The patient was maintained on a cardiac monitor.  I personally viewed and interpreted the cardiac monitored which showed an underlying rhythm of: NSR -Imaging independently viewed and interpreted by me and I agree with radiologist's interpretation.  Result remarkable for head and cervical spine CT showing no acute intracranial abnormality or significant interval chantes.  Severe central canal stenosis noted at multiple cervical spinal level, likely contributing to his recurrent pain -This patient presents to the ED for concern of neck pain, this involves an extensive number of treatment options, and is a complaint that carries with it a high risk of complications and morbidity.  The differential diagnosis includes spinal stenosis, spinal fx, meningitis, neck sprain -Co morbidities that complicate the patient evaluation includes chronic opioid dependency, chronic neck pain, cardiac disease on plavix -Treatment includes oxycodone 10mg  and lidoderm patch -Reevaluation of the patient after these medicines showed that the patient improved -PCP office notes or outside notes reviewed -Discussion with attending Dr. Maylon Peppers -Escalation to admission/observation considered: patients feels much better, is comfortable with discharge, and will follow up with PCP -Prescription medication considered, patient comfortable with lidoderm patch -Social Determinant of Health considered   1:36 PM Patient's presentation is consistent with acute on chronic neck pain and headache.  Low suspicion for subarachnoid hemorrhage, stroke, or other acute emergent  medical condition.  Low suspicion for any kind of infection fracture or dislocation.  Doubt meningitis.  Patient was given supportive care with improvement of symptoms.  Will discharge home with Lidoderm patch for pain control.  Recommend patient to follow-up with his pain specialist for further care.        Final Clinical Impression(s) / ED Diagnoses Final diagnoses:  Chronic neck pain    Rx / DC Orders ED Discharge Orders          Ordered    lidocaine (LIDODERM) 5 %  Every 24 hours        08/03/22 1337              Domenic Moras, PA-C 08/03/22 1338    Maylon Peppers, [place], DO 08/03/22 1559

## 2022-08-03 NOTE — Discharge Instructions (Signed)
You have been evaluated for your symptoms.  Fortunately CT scan of your head and neck did not show any concerning finding.  Your pain is likely due to spinal stenosis that you had had in the past.  You may apply Lidoderm patch to the back of your neck for pain relief.  Continue to take your home medication and follow-up closely with your doctor for further care.

## 2022-08-03 NOTE — ED Provider Triage Note (Signed)
Emergency Medicine Provider Triage Evaluation Note  [name] [name] , a 72 y.o. male  was evaluated in triage.  Pt complains of headache. Progressive worsening headache, neck pain radiates to both shoulders.  Hx of chronic opioid dependency, hx of occipital neuralgia.  Also is on Plavix. No fever, chills, n/v/d, focal numbness, focal weakness, confusion, vision changes  Review of Systems  Positive: As above Negative: As above  Physical Exam  BP 130/72   Pulse 90   Temp 97.9 F (36.6 C) (Oral)   Resp 16   SpO2 95%  Gen:   Awake, no distress   Resp:  Normal effort  MSK:   Moves extremities without difficulty  Other:    Medical Decision Making  Medically screening exam initiated at 11:24 AM.  Appropriate orders placed.  SHAHIEM BEDWELL was informed that the remainder of the evaluation will be completed by another provider, this initial triage assessment does not replace that evaluation, and the importance of remaining in the ED until their evaluation is complete.     Domenic Moras, PA-C 08/03/22 1131

## 2022-08-03 NOTE — ED Triage Notes (Signed)
Pt here with neck pain radiating across to both shoulders along with headache onset 4 days ago. Seen at [facility] as well as another ED without a definitive cause. Pt reports ongoing pain.

## 2022-08-04 ENCOUNTER — Telehealth: Payer: Self-pay | Admitting: Cardiovascular Disease

## 2022-08-04 NOTE — Telephone Encounter (Signed)
Spoke to wife and informed that Dr. Burt Knack is not back until next week and that he may not respond until then. Explained that I am not sure he would recommend anything more that starting w/ PCP about the concerns.   Wife mostly concerned w/ pt being "incoherent".  Advised that she call PCP and have pt seen ASAP. Aware will forward to Dr. Burt Knack and his nurse. She appreciates the call back and advisement.

## 2022-08-04 NOTE — Telephone Encounter (Signed)
Patient's wife states the patient has been experiencing severe pain in his neck, back, and head. She mentions that the patient has been incoherent and recently had multiple ED visits. She would like to know if Dr. Burt Knack had any recommendations.

## 2022-08-11 NOTE — Telephone Encounter (Signed)
Thx I agree with Sherri's recommendations. Unfortunately his problems are not cardiac in nature and I think his Primary Care doctor will need to address these. I'm sorry to hear he having such problems.

## 2022-08-11 NOTE — Telephone Encounter (Signed)
Wife informed of MD statement. Advised to contact the surgeon, discuss a date for neck surgery, have them fax clearance request to our office, and call to schedule an OV for clearance. Aware will discuss risks for holding Plavix when they come in for clearance. Wife appreciates all my help with this matter and is agreeable to plan.

## 2022-08-11 NOTE — Telephone Encounter (Signed)
Wife made aware.  She states pt really needs neck surgery but that they were told by the doctor that Dr. Burt Knack said pt could not stop Plavix for procedure. Aware I am forwarding to MD for advisement. (Wife aware they will still need to send pre op clearance request to office)

## 2022-08-11 NOTE — Telephone Encounter (Signed)
Yes. There is some risk to him coming off of plavix, but overall his risk is relatively low. I'm happy to discuss with them if he really needs to have neck surgery.
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
## 2022-09-20 NOTE — Patient Instructions (Signed)
____________________________________________________________________________________________  Patient Information update  To: All of our patients.  Re: Name change.  It has been made official that our current name, "[facility]"   will soon be changed to "[facility]".   The purpose of this change is to eliminate any confusion created by the concept of our practice being a "Medication Management Pain Clinic". In the past this has led to the misconception that we treat pain primarily by the use of prescription medications.  Nothing can be farther from the truth.   Understanding PAIN MANAGEMENT: To further understand what our practice does, you first have to understand that "Pain Management" is a subspecialty that requires additional training once a physician has completed their specialty training, which can be in either Anesthesia, Neurology, Psychiatry, or Physical Medicine and Rehabilitation (PMR). Each one of these contributes to the final approach taken by each physician to the management of their patient's pain. To be a "Pain Management Specialist" you must have first completed one of the specialty trainings below.  Anesthesiologists - trained in clinical pharmacology and interventional techniques such as nerve blockade and regional as well as central neuroanatomy. They are trained to block pain before, during, and after surgical interventions.  Neurologists - trained in the diagnosis and pharmacological treatment of complex neurological conditions, such as Multiple Sclerosis, Parkinson's, spinal cord injuries, and other systemic conditions that may be associated with symptoms that may include but are not limited to pain. They tend to rely primarily on the treatment of chronic pain using prescription medications.  Psychiatrist - trained in conditions affecting the psychosocial  wellbeing of patients including but not limited to depression, anxiety, schizophrenia, personality disorders, addiction, and other substance use disorders that may be associated with chronic pain. They tend to rely primarily on the treatment of chronic pain using prescription medications.   Physical Medicine and Rehabilitation (PMR) physicians, also known as physiatrists - trained to treat a wide variety of medical conditions affecting the brain, spinal cord, nerves, bones, joints, ligaments, muscles, and tendons. Their training is primarily aimed at treating patients that have suffered injuries that have caused severe physical impairment. Their training is primarily aimed at the physical therapy and rehabilitation of those patients. They may also work alongside orthopedic surgeons or neurosurgeons using their expertise in assisting surgical patients to recover after their surgeries.  INTERVENTIONAL PAIN MANAGEMENT is sub-subspecialty of Pain Management.  Our physicians are Board-certified in Anesthesia, Pain Management, and Interventional Pain Management.  This meaning that not only have they been trained and Board-certified in their specialty of Anesthesia, and subspecialty of Pain Management, but they have also received further training in the sub-subspecialty of Interventional Pain Management, in order to become Board-certified as INTERVENTIONAL PAIN MANAGEMENT SPECIALIST.    Mission: Our goal is to use our skills in  [place] as alternatives to the chronic use of prescription opioid medications for the treatment of pain. To make this more clear, we have changed our name to reflect what we do and offer. We will continue to offer medication management assessment and recommendations, but we will not be taking over any patient's medication management.  ____________________________________________________________________________________________      ____________________________________________________________________________________________  Opioid Pain Medication Update  To: All patients taking opioid pain medications. (I.e.: hydrocodone, hydromorphone, oxycodone, oxymorphone, morphine, codeine, methadone, tapentadol, tramadol, buprenorphine, fentanyl, etc.)  Re: Updated review of side effects and adverse reactions of opioid analgesics, as well as new  information about long term effects of this class of medications.  Direct risks of long-term opioid therapy are not limited to opioid addiction and overdose. Potential medical risks include serious fractures, breathing problems during sleep, hyperalgesia, immunosuppression, chronic constipation, bowel obstruction, myocardial infarction, and tooth decay secondary to xerostomia.  Unpredictable adverse effects that can occur even if you take your medication correctly: Cognitive impairment, respiratory depression, and death. Most people think that if they take their medication "correctly", and "as instructed", that they will be safe. Nothing could be farther from the truth. In reality, a significant amount of recorded deaths associated with the use of opioids has occurred in individuals that had taken the medication for a long time, and were taking their medication correctly. The following are examples of how this can happen: Patient taking his/her medication for a long time, as instructed, without any side effects, is given a certain antibiotic or another unrelated medication, which in turn triggers a "Drug-to-drug interaction" leading to disorientation, cognitive impairment, impaired reflexes, respiratory depression or an untoward event leading to serious bodily harm or injury, including death.  Patient taking his/her medication for a long time, as instructed, without any side effects, develops an acute impairment of liver and/or kidney function. This will lead to a rapid inability of the body to  breakdown and eliminate their pain medication, which will result in effects similar to an "overdose", but with the same medicine and dose that they had always taken. This again may lead to disorientation, cognitive impairment, impaired reflexes, respiratory depression or an untoward event leading to serious bodily harm or injury, including death.  A similar problem will occur with patients as they grow older and their liver and kidney function begins to decrease as part of the aging process.  Background information: Historically, the original case for using long-term opioid therapy to treat chronic noncancer pain was based on safety assumptions that subsequent experience has called into question. In 1996, the American Pain Society and the [place] Academy [employer] issued a consensus statement supporting long-term opioid therapy. This statement acknowledged the dangers of opioid prescribing but concluded that the risk for addiction was low; respiratory depression induced by opioids was short-lived, occurred mainly in opioid-naive patients, and was antagonized by pain; tolerance was not a common problem; and efforts to control diversion should not constrain opioid prescribing. This has now proven to be wrong. Experience regarding the risks for opioid addiction, misuse, and overdose in community practice has failed to support these assumptions.  According to the Centers for Disease Control and Prevention, fatal overdoses involving opioid analgesics have increased sharply over the past decade. Currently, more than 96,700 people die from drug overdoses every year. Opioids are a factor in 7 out of every 10 overdose deaths. Deaths from drug overdose have surpassed motor vehicle accidents as the leading cause of death for individuals between the ages of 89 and 65.  Clinical data suggest that neuroendocrine dysfunction may be very common in both men and women, potentially causing hypogonadism, erectile  dysfunction, infertility, decreased libido, osteoporosis, and depression. Recent studies linked higher opioid dose to increased opioid-related mortality. Controlled observational studies reported that long-term opioid therapy may be associated with increased risk for cardiovascular events. Subsequent meta-analysis concluded that the safety of long-term opioid therapy in elderly patients has not been proven.   Side Effects and adverse reactions: Common side effects: Drowsiness (sedation). Dizziness. Nausea and vomiting. Constipation. Physical dependence -- Dependence often manifests with withdrawal symptoms when opioids are discontinued  or decreased. Tolerance -- As you take repeated doses of opioids, you require increased medication to experience the same effect of pain relief. Respiratory depression -- This can occur in healthy people, especially with higher doses. However, people with COPD, asthma or other lung conditions may be even more susceptible to fatal respiratory impairment.  Uncommon side effects: An increased sensitivity to feeling pain and extreme response to pain (hyperalgesia). Chronic use of opioids can lead to this. Delayed gastric emptying (the process by which the contents of your stomach are moved into your small intestine). Muscle rigidity. Immune system and hormonal dysfunction. Quick, involuntary muscle jerks (myoclonus). Arrhythmia. Itchy skin (pruritus). Dry mouth (xerostomia).  Long-term side effects: Chronic constipation. Sleep-disordered breathing (SDB). Increased risk of bone fractures. Hypothalamic-pituitary-adrenal dysregulation. Increased risk of overdose.  RISKS: Fractures and Falls:  Opioids increase the risk and incidence of falls. This is of particular importance in elderly patients.  Endocrine System:  Long-term administration is associated with endocrine abnormalities. Influences on both the hypothalamic-pituitary-adrenal axis?and the  hypothalamic-pituitary-gonadal axis have been demonstrated with consequent hypogonadism and adrenal insufficiency in both sexes. Hypogonadism and decreased levels of dehydroepiandrosterone sulfate have been reported in men and women. Endocrine effects can lead to: Amenorrhoea in women Reduced libido in both sexes Erectile dysfunction in men Infertility Depression and fatigue Patients (particularly women of childbearing age) should avoid opioids. There is insufficient evidence to recommend routine monitoring of asymptomatic patients taking opioids in the long-term for hormonal deficiencies.  Immune System: Human studies have demonstrated that opioids have an immunomodulating effect. These effects are mediated via opioid receptors both on immune effector cells and in the central nervous system. Opioids have been demonstrated to have adverse effects on antimicrobial response and anti-tumour surveillance. Buprenorphine has been demonstrated to have no impact on immune function.  Opioid Induced Hyperalgesia: Human studies have demonstrated that prolonged use of opioids can lead to a state of abnormal pain sensitivity, sometimes called opioid induced hyperalgesia (OIH). Opioid induced hyperalgesia is not usually seen in the absence of tolerance to opioid analgesia. Clinically, hyperalgesia may be diagnosed if the patient on long-term opioid therapy presents with increased pain. This might be qualitatively and anatomically distinct from pain related to disease progression or to breakthrough pain resulting from development of opioid tolerance. Pain associated with hyperalgesia tends to be more diffuse than the pre-existing pain and less defined in quality. Management of opioid induced hyperalgesia requires opioid dose reduction.  Cancer: Chronic opioid therapy has been associated with an increased risk of cancer among noncancer patients with chronic pain. This association was more evident in chronic  strong opioid users. Chronic opioid consumption causes significant pathological changes in the small intestine and colon. Epidemiological studies have found that there is a link between opium dependence and initiation of gastrointestinal cancers. Cancer is the second leading cause of death after cardiovascular disease. Chronic use of opioids can cause multiple conditions such as GERD, immunosuppression and renal damage as well as carcinogenic effects, which are associated with the incidence of cancers.   Mortality: Long-term opioid use has been associated with increased mortality among patients with chronic non-cancer pain (CNCP).  Prescription of long-acting opioids for chronic noncancer pain was associated with a significantly increased risk of all-cause mortality, including deaths from causes other than overdose.  Reference: Von Korff M, Kolodny A, Deyo RA, Chou R. Long-term opioid therapy reconsidered. Ann Intern Med. 2011 Sep 6;155(5):325-8. doi: 10.7326/[id number]. PMID: [id number]; PMCIDLD:501236. Alexandria Lodge RA, Dunn KM,  [place] KP. Risk of adverse events in patients prescribed long-term opioids: A cohort study in the [place] Clinical Practice Research D[employer]ink. Eur J Pain. 2019 May;23(5):908-922. doi: 10.1002/ejp.1357. Epub 2019 Jan 31. PMID: [id number]. Colameco S, Coren JS, Ciervo CA. Continuous opioid treatment for chronic noncancer pain: a time for moderation in prescribing. Postgrad Med. 2009 Jul;121(4):61-6. doi: 10.3810/pgm.2009.07.2032. PMID: [id number]. Heywood Bene RN, Hiram SD, Blazina I, Rosalio Loud, Bougatsos C, Deyo RA. The effectiveness and risks of long-term opioid therapy for chronic pain: a systematic review for a [employer] of Health Pathways to [employer]. Ann Intern Med. 2015 Feb 17;162(4):276-86. doi: [id number]. PMID: [id number]. Marjory Sneddon [facility], Makuc DM. NCHS Data Brief No. 22. [place]:  Centers for Disease Control and Prevention; 2009. Sep, Increase in Fatal Poisonings Involving Opioid Analgesics in the [place], 1999-2006. Song IA, Choi HR, Oh TK. Long-term opioid use and mortality in patients with chronic non-cancer pain: Ten-year follow-up study in [place] from 2010 through 2019. EClinicalMedicine. 2022 Jul 18;51:101558. doi: 10.1016/j.eclinm.2022.UB:[id number]. PMID: [id number]; PMCIDOX:8550940. Huser, W., Schubert, T., Vogelmann, T. et al. All-cause mortality in patients with long-term opioid therapy compared with non-opioid analgesics for chronic non-cancer pain: a database study. [place] Med 18, 162 (2020). [email or web address].[email or web address].com/ Rashidian H, Roxy Cedar, Malekzadeh R, Haghdoost AA. An Ecological Study of the Association between Opiate Use and Incidence of Cancers. Addict Health. 2016 Fall;8(4):252-260. PMID: [id number]; PMCIDQI:9185013.  Our Goal: Our goal is to control your pain with means other than the use of opioid pain medications.  Our Recommendation: Talk to your physician about coming off of these medications. We can assist you with the tapering down and stopping these medicines. Based on the new information, even if you cannot completely stop the medication, a decrease in the dose may be associated with a lesser risk. Ask for other means of controlling the pain. Decrease or eliminate those factors that significantly contribute to your pain such as smoking, obesity, and a diet heavily tilted towards "inflammatory" nutrients.  ____________________________________________________________________________________________     ____________________________________________________________________________________________  [facility] Brazen Pain Medication Shortage  The [place] is experiencing worsening drug shortages. These have had a negative widespread effect on patient care and treatment. Not expected to improve any time soon. Predicted to last past  2029.   Drug shortage list (generic names) Oxycodone IR Oxycodone/APAP Oxymorphone IR Hydromorphone Hydrocodone/APAP Morphine  Where is the problem?  Manufacturing and supply level.  Will this shortage affect you?  Only if you take any of the above pain medications.  How? You may be unable to fill your prescription.  Your pharmacist may offer a "partial fill" of your prescription. (Warning: Do not accept partial fills.) Prescriptions partially filled cannot be transferred to another pharmacy. Read our Medication Rules and Regulation. Depending on how much medicine you are dependent on, you may experience withdrawals when unable to get the medication.  Recommendations: Consider ending your dependence on opioid pain medications. Ask your pain specialist to assist you with the process. Consider switching to a medication currently not in shortage, such as Buprenorphine. Talk to your pain specialist about this option. Consider decreasing your pain medication requirements by managing tolerance thru "Drug Holidays". This may help minimize withdrawals, should you run out of medicine. Control your pain thru the use of non-pharmacological interventional therapies.   Your prescriber: Prescribers cannot be blamed for shortages. Medication manufacturing and supply issues cannot be fixed by the prescriber.   NOTE: The prescriber is not responsible for supplying  the medication, or solving supply issues. Work with your pharmacist to solve it. The patient is responsible for the decision to take or continue taking the medication and for identifying and securing a legal supply source. By law, supplying the medication is the job and responsibility of the pharmacy. The prescriber is responsible for the evaluation, monitoring, and prescribing of these medications.   Prescribers will NOT: Re-issue prescriptions that have been partially filled. Re-issue prescriptions already sent to a pharmacy.  Re-send  prescriptions to a different pharmacy because yours did not have your medication. Ask pharmacist to order more medicine or transfer the prescription to another pharmacy. (Read below.)  New 2023 regulation: "April 03, 2022 Revised Regulation Allows DEA-Registered Pharmacies to Transfer Electronic Prescriptions at a Patient's Request [place] Patients now have the ability to request their electronic prescription be transferred to another pharmacy without having to go back to their practitioner to initiate the request. This revised regulation went into effect on Monday, March 30, 2022.     At a patient's request, a DEA-registered retail pharmacy can now transfer an electronic prescription for a controlled substance (schedules II-V) to another DEA-registered retail pharmacy. Prior to this change, patients would have to go through their practitioner to cancel their prescription and have it re-issued to a different pharmacy. The process was taxing and time consuming for both patients and practitioners.    The Drug Enforcement Administration [facility]) published its intent to revise the process for transferring electronic prescriptions on June 21, 2020.  The final rule was published in the federal register on February 26, 2022 and went into effect 30 days later.  Under the final rule, a prescription can only be transferred once between pharmacies, and only if allowed under existing state or other applicable law. The prescription must remain in its electronic form; may not be altered in any way; and the transfer must be communicated directly between two licensed pharmacists. It's important to note, any authorized refills transfer with the original prescription, which means the entire prescription will be filled at the same pharmacy".  Reference: [email or web address] [facility]  website announcement)  [email or web address] ([place])   [employer] / Vol. 88, No. 143 / Thursday, February 26, 2022 / Rules and Regulations DEPARTMENT OF JUSTICE  Drug Enforcement Administration  21 CFR Part 1306  [Docket No. [id number]]  RIN [id number] Transfer of Electronic Prescriptions for Schedules II-V Controlled Substances Between Pharmacies for Initial Filling  ____________________________________________________________________________________________     _______________________________________________________________________  Medication Rules  Purpose: To inform patients, and their family members, of our medication rules and regulations.  Applies to: All patients receiving prescriptions from our practice (written or electronic).  Pharmacy of record: This is the pharmacy where your electronic prescriptions will be sent. Make sure we have the correct one.  Electronic prescriptions: In compliance with the [place] (STOP) Act of 2017 (Session Lanny Cramp [phone]), effective August 03, 2018, all controlled substances must be electronically prescribed. Written prescriptions, faxing, or calling prescriptions to a pharmacy will no longer be done.  Prescription refills: These will be provided only during in-person appointments. No medications will be renewed without a "face-to-face" evaluation with your provider. Applies to all prescriptions.  NOTE: The following applies primarily to controlled substances (Opioid* Pain Medications).   Type of encounter (visit): For patients receiving controlled substances, face-to-face visits are required. (Not an option and not up to the patient.)  Patient's responsibilities: Pain Pills: Bring  all pain pills to every appointment (except for procedure appointments). Pill Bottles: Bring pills in original pharmacy bottle. Bring  bottle, even if empty. Always bring the bottle of the most recent fill.  Medication refills: You are responsible for knowing and keeping track of what medications you are taking and when is it that you will need a refill. The day before your appointment: write a list of all prescriptions that need to be refilled. The day of the appointment: give the list to the admitting nurse. Prescriptions will be written only during appointments. No prescriptions will be written on procedure days. If you forget a medication: it will not be "Called in", "Faxed", or "electronically sent". You will need to get another appointment to get these prescribed. No early refills. Do not call asking to have your prescription filled early. Partial  or short prescriptions: Occasionally your pharmacy may not have enough pills to fill your prescription.  NEVER ACCEPT a partial fill or a prescription that is short of the total amount of pills that you were prescribed.  With controlled substances the law allows 72 hours for the pharmacy to complete the prescription.  If the prescription is not completed within 72 hours, the pharmacist will require a new prescription to be written. This means that you will be short on your medicine and we WILL NOT send another prescription to complete your original prescription.  Instead, request the pharmacy to send a carrier to a nearby branch to get enough medication to provide you with your full prescription. Prescription Accuracy: You are responsible for carefully inspecting your prescriptions before leaving our office. Have the discharge nurse carefully go over each prescription with you, before taking them home. Make sure that your name is accurately spelled, that your address is correct. Check the name and dose of your medication to make sure it is accurate. Check the number of pills, and the written instructions to make sure they are clear and accurate. Make sure that you are given enough medication  to last until your next medication refill appointment. Taking Medication: Take medication as prescribed. When it comes to controlled substances, taking less pills or less frequently than prescribed is permitted and encouraged. Never take more pills than instructed. Never take the medication more frequently than prescribed.  Inform other Doctors: Always inform, all of your healthcare providers, of all the medications you take. Pain Medication from other Providers: You are not allowed to accept any additional pain medication from any other Doctor or Healthcare provider. There are two exceptions to this rule. (see below) In the event that you require additional pain medication, you are responsible for notifying us, as stated below. Cough Medicine: Often these contain an opioid, such as codeine or hydrocodone. Never accept or take cough medicine containing these opioids if you are already taking an opioid* medication. The combination may cause respiratory failure and death. Medication Agreement: You are responsible for carefully reading and following our Medication Agreement. This must be signed before receiving any prescriptions from our practice. Safely store a copy of your signed Agreement. Violations to the Agreement will result in no further prescriptions. (Additional copies of our Medication Agreement are available upon request.) Laws, Rules, & Regulations: All patients are expected to follow all Federal and [employer], Rules, [facility]. Ignorance of the Laws does not constitute a valid excuse.  Illegal drugs and Controlled Substances: The use of illegal substances (including, but not limited to marijuana and its derivatives) and/or the illegal use of  any controlled substances is strictly prohibited. Violation of this rule may result in the immediate and permanent discontinuation of any and all prescriptions being written by our practice. The use of any illegal substances is  prohibited. Adopted CDC guidelines & recommendations: Target dosing levels will be at or below 60 MME/day. Use of benzodiazepines** is not recommended.  Exceptions: There are only two exceptions to the rule of not receiving pain medications from other Healthcare Providers. Exception #1 (Emergencies): In the event of an emergency (i.e.: accident requiring emergency care), you are allowed to receive additional pain medication. However, you are responsible for: As soon as you are able, call our office [phone], at any time of the day or night, and leave a message stating your name, the date and nature of the emergency, and the name and dose of the medication prescribed. In the event that your call is answered by a member of our staff, make sure to document and save the date, time, and the name of the person that took your information.  Exception #2 (Planned Surgery): In the event that you are scheduled by another doctor or dentist to have any type of surgery or procedure, you are allowed (for a period no longer than 30 days), to receive additional pain medication, for the acute post-op pain. However, in this case, you are responsible for picking up a copy of our "Post-op Pain Management for Surgeons" handout, and giving it to your surgeon or dentist. This document is available at our office, and does not require an appointment to obtain it. Simply go to our office during business hours (Monday-Thursday from 8:00 AM to 4:00 PM) (Friday 8:00 AM to 12:00 Noon) or if you have a scheduled appointment with [place], prior to your surgery, and ask for it by name. In addition, you are responsible for: calling our office [phone], at any time of the day or night, and leaving a message stating your name, name of your surgeon, type of surgery, and date of procedure or surgery. Failure to comply with your responsibilities may result in termination of therapy involving the controlled substances. Medication Agreement  Violation. Following the above rules, including your responsibilities will help you in avoiding a Medication Agreement Violation ("Breaking your Pain Medication Contract").  Consequences:  Not following the above rules may result in permanent discontinuation of medication prescription therapy.  *Opioid medications include: morphine, codeine, oxycodone, oxymorphone, hydrocodone, hydromorphone, meperidine, tramadol, tapentadol, buprenorphine, fentanyl, methadone. **Benzodiazepine medications include: diazepam (Valium), alprazolam (Xanax), clonazepam (Klonopine), lorazepam (Ativan), clorazepate (Tranxene), chlordiazepoxide (Librium), estazolam (Prosom), oxazepam (Serax), temazepam (Restoril), triazolam (Halcion) (Last updated: 05/26/2022) ______________________________________________________________________    ______________________________________________________________________  Medication Recommendations and Reminders  Applies to: All patients receiving prescriptions (written and/or electronic).  Medication Rules & Regulations: You are responsible for reading, knowing, and following our "Medication Rules" document. These exist for your safety and that of others. They are not flexible and neither are we. Dismissing or ignoring them is an act of "non-compliance" that may result in complete and irreversible termination of such medication therapy. For safety reasons, "non-compliance" will not be tolerated. As with the [place] fundamental legal principle of "ignorance of the law is no defense", we will accept no excuses for not having read and knowing the content of documents provided to you by our practice.  Pharmacy of record:  Definition: This is the pharmacy where your electronic prescriptions will be sent.  We do not endorse any particular pharmacy. It is up to you and your insurance  to decide what pharmacy to use.  We do not restrict you in your choice of pharmacy. However, once we write for  your prescriptions, we will NOT be re-sending more prescriptions to fix restricted supply problems created by your pharmacy, or your insurance.  The pharmacy listed in the electronic medical record should be the one where you want electronic prescriptions to be sent. If you choose to change pharmacy, simply notify our nursing staff. Changes will be made only during your regular appointments and not over the phone.  Recommendations: Keep all of your pain medications in a safe place, under lock and key, even if you live alone. We will NOT replace lost, stolen, or damaged medication. We do not accept "Police Reports" as proof of medications having been stolen. After you fill your prescription, take 1 week's worth of pills and put them away in a safe place. You should keep a separate, properly labeled bottle for this purpose. The remainder should be kept in the original bottle. Use this as your primary supply, until it runs out. Once it's gone, then you know that you have 1 week's worth of medicine, and it is time to come in for a prescription refill. If you do this correctly, it is unlikely that you will ever run out of medicine. To make sure that the above recommendation works, it is very important that you make sure your medication refill appointments are scheduled at least 1 week before you run out of medicine. To do this in an effective manner, make sure that you do not leave the office without scheduling your next medication management appointment. Always ask the nursing staff to show you in your prescription , when your medication will be running out. Then arrange for the receptionist to get you a return appointment, at least 7 days before you run out of medicine. Do not wait until you have 1 or 2 pills left, to come in. This is very poor planning and does not take into consideration that we may need to cancel appointments due to bad weather, sickness, or emergencies affecting our staff. DO NOT ACCEPT A  "Partial Fill": If for any reason your pharmacy does not have enough pills/tablets to completely fill or refill your prescription, do not allow for a "partial fill". The law allows the pharmacy to complete that prescription within 72 hours, without requiring a new prescription. If they do not fill the rest of your prescription within those 72 hours, you will need a separate prescription to fill the remaining amount, which we will NOT provide. If the reason for the partial fill is your insurance, you will need to talk to the pharmacist about payment alternatives for the remaining tablets, but again, DO NOT ACCEPT A PARTIAL FILL, unless you can trust your pharmacist to obtain the remainder of the pills within 72 hours.  Prescription refills and/or changes in medication(s):  Prescription refills, and/or changes in dose or medication, will be conducted only during scheduled medication management appointments. (Applies to both, written and electronic prescriptions.) No refills on procedure days. No medication will be changed or started on procedure days. No changes, adjustments, and/or refills will be conducted on a procedure day. Doing so will interfere with the diagnostic portion of the procedure. No phone refills. No medications will be "called into the pharmacy". No Fax refills. No weekend refills. No Holliday refills. No after hours refills.  Remember:  Business hours are:  Monday to Thursday 8:00 AM to 4:00 PM Provider's Schedule: [place]  Dossie Arbour, MD - Appointments are:  Medication management: Monday and Wednesday 8:00 AM to 4:00 PM Procedure day: Tuesday and Thursday 7:30 AM to 4:00 PM Gillis Santa, MD - Appointments are:  Medication management: Tuesday and Thursday 8:00 AM to 4:00 PM Procedure day: Monday and Wednesday 7:30 AM to 4:00 PM (Last update: 05/26/2022) ______________________________________________________________________     ____________________________________________________________________________________________  Drug Holidays  What is a "Drug Holiday"? Drug Holiday: is the name given to the process of slowly tapering down and temporarily stopping the pain medication for the purpose of decreasing or eliminating tolerance to the drug.  Benefits Improved effectiveness Decreased required effective dose Improved pain control End dependence on high dose therapy Decrease cost of therapy Uncovering "opioid-induced hyperalgesia". (OIH)  What is "opioid hyperalgesia"? It is a paradoxical increase in pain caused by exposure to opioids. Stopping the opioid pain medication, contrary to the expected, it actually decreases or completely eliminates the pain. Ref.: "A comprehensive review of opioid-induced hyperalgesia". Brion Aliment, et.al. Pain Physician. 2011 Mar-Apr;14(2):145-61.  What is tolerance? Tolerance: the progressive loss of effectiveness of a pain medicine due to repetitive use. A common problem of opioid pain medications.  How long should a "Drug Holiday" last? Effectiveness depends on the patient staying off all opioid pain medicines for a minimum of 14 consecutive days. (2 weeks)  How about just taking less of the medicine? Does not work. Will not accomplish goal of eliminating the excess receptors.  How about switching to a different pain medicine? (AKA. "Opioid rotation") Does not work. Creates the illusion of effectiveness by taking advantage of inaccurate equivalent dose calculations between different opioids. -This "technique" was promoted by studies funded by [employer], such as [employer], creators of "OxyContin".  Can I stop the medicine "cold [place]"? Depends. You should always coordinate with your Pain Specialist to make the transition as smoothly as possible. Avoid stopping the medicine abruptly without consulting. We recommend a "slow taper".  What is a slow  taper? Taper: refers to the gradual decrease in dose.   How do I stop/taper the dose? Slowly. Decrease the daily amount of pills that you take by one (1) pill every seven (7) days. This is called a "slow downward taper". Example: if you normally take four (4) pills per day, drop it to three (3) pills per day for seven (7) days, then to two (2) pills per day for seven (7) days, then to one (1) per day for seven (7) days, and then stop the medicine. The 14 day "Drug Holiday" starts on the first day without medicine.   Will I experience withdrawals? Unlikely with a slow taper.  What triggers withdrawals? Withdrawals are triggered by the sudden/abrupt stop of high dose opioids. Withdrawals can be avoided by slowly decreasing the dose over a prolonged period of time.  What are withdrawals? Symptoms associated with sudden/abrupt reduction/stopping of high-dose, long-term use of pain medication. Withdrawal are seldom seen on low dose therapy, or patients rarely taking opioid medication.  Early Withdrawal Symptoms may include: Agitation Anxiety Muscle aches Increased tearing Insomnia Runny nose Sweating Yawning  Late symptoms may include: Abdominal cramping Diarrhea Dilated pupils Goose bumps Nausea Vomiting  (Last update: 07/12/2022) ____________________________________________________________________________________________    ____________________________________________________________________________________________  WARNING: CBD (cannabidiol) & Delta (Delta-8 tetrahydrocannabinol) products.   Applicable to:  All individuals currently taking or considering taking CBD (cannabidiol) and, more important, all patients taking opioid analgesic controlled substances (pain medication). (Example: oxycodone; oxymorphone; hydrocodone; hydromorphone; morphine; methadone; tramadol; tapentadol; fentanyl; buprenorphine; butorphanol; dextromethorphan;  meperidine; codeine; etc.)  Introduction:   Recently there has been a drive towards the use of "natural" products for the treatment of different conditions, including pain anxiety and sleep disorders. Marijuana and hemp are two varieties of the cannabis genus plants. Marijuana and its derivatives are illegal, while hemp and its derivatives are not. Cannabidiol (CBD) and tetrahydrocannabinol (THC), are two natural compounds found in plants of the Cannabis genus. They can both be extracted from hemp or marijuana. Both compounds interact with your body's endocannabinoid system in very different ways. CBD is associated with pain relief (analgesia) while THC is associated with the psychoactive effects ("the high") obtained from the use of marijuana products. There are two main types of THC: Delta-9, which comes from the marijuana plant and it is illegal, and Delta-8, which comes from the hemp plant, and it is legal. (Both, Delta-9-THC and Delta-8-THC are psychoactive and give you "the high".)   Legality:  Marijuana and its derivatives: illegal Hemp and its derivatives: Legal (State dependent) UPDATE: (09/19/2021) The Drug Enforcement Agency ([place]) issued a letter stating that "delta" cannabinoids, including Delta-8-THCO and Delta-9-THCO, synthetically derived from hemp do not qualify as hemp and will be viewed as Schedule I drugs. (Schedule I drugs, substances, or chemicals are defined as drugs with no currently accepted medical use and a high potential for abuse. Some examples of Schedule I drugs are: heroin, lysergic acid diethylamide (LSD), marijuana (cannabis), 3,4-methylenedioxymethamphetamine (ecstasy), methaqualone, and peyote.) (https://jennings.com/)  Legal status of CBD in [place]:  "Conditionally Legal"  Reference: "FDA Regulation of Cannabis and Cannabis-Derived Products, Including Cannabidiol (CBD)" - SeekArtists.com.pt  Warning:   CBD is not FDA approved and has not undergo the same manufacturing controls as prescription drugs.  This means that the purity and safety of available CBD may be questionable. Most of the time, despite manufacturer's claims, it is contaminated with THC (delta-9-tetrahydrocannabinol - the chemical in marijuana responsible for the "HIGH").  When this is the case, the [facility] contaminant will trigger a positive urine drug screen (UDS) test for Marijuana (carboxy-THC).   The FDA recently put out a warning about 5 things that everyone should be aware of regarding Delta-8 THC: Delta-8 THC products have not been evaluated or approved by the FDA for safe use and may be marketed in ways that put the public health at risk. The FDA has received adverse event reports involving delta-8 THC-containing products. Delta-8 THC has psychoactive and intoxicating effects. Delta-8 THC manufacturing often involve use of potentially harmful chemicals to create the concentrations of delta-8 THC claimed in the marketplace. The final delta-8 THC product may have potentially harmful by-products (contaminants) due to the chemicals used in the process. Manufacturing of delta-8 THC products may occur in uncontrolled or unsanitary settings, which may lead to the presence of unsafe contaminants or other potentially harmful substances. Delta-8 THC products should be kept out of the reach of children and pets.  NOTE: Because a positive UDS for any illicit substance is a violation of our medication agreement, your opioid analgesics (pain medicine) may be permanently discontinued.  MORE ABOUT CBD  General Information: CBD was discovered in 2 and it is a derivative of the cannabis sativa genus plants (Marijuana and Hemp). It is one of the 113 identified substances found in Marijuana. It accounts for up to 40% of the plant's extract. As of 2018, preliminary clinical studies on CBD included research for the treatment of anxiety, movement  disorders, and pain. CBD is available and consumed in multiple forms, including  inhalation of smoke or vapor, as an aerosol spray, and by mouth. It may be supplied as an oil containing CBD, capsules, dried cannabis, or as a liquid solution. CBD is thought not to be as psychoactive as THC (delta-9-tetrahydrocannabinol - the chemical in marijuana responsible for the "HIGH"). Studies suggest that CBD may interact with different biological target receptors in the body, including cannabinoid and other neurotransmitter receptors. As of 2018 the mechanism of action for its biological effects has not been determined.  Side-effects  Adverse reactions: Dry mouth, diarrhea, decreased appetite, fatigue, drowsiness, malaise, weakness, sleep disturbances, and others.  Drug interactions:  CBD may interact with medications such as blood-thinners. CBD causes drowsiness on its own and it will increase drowsiness caused by other medications, including antihistamines (such as Benadryl), benzodiazepines (Xanax, Ativan, Valium), antipsychotics, antidepressants, opioids, alcohol and supplements such as kava, melatonin and St. John's Wort.  Other drug interactions: Brivaracetam (Briviact); Caffeine; Carbamazepine (Tegretol); Citalopram (Celexa); Clobazam (Onfi); Eslicarbazepine (Aptiom); Everolimus (Zostress); Lithium; Methadone (Dolophine); Rufinamide (Banzel); Sedative medications (CNS depressants); Sirolimus (Rapamune); Stiripentol (Diacomit); Tacrolimus (Prograf); Tamoxifen ; Soltamox); Topiramate (Topamax); Valproate; Warfarin (Coumadin); Zonisamide. (Last update: 07/13/2022) ____________________________________________________________________________________________   ____________________________________________________________________________________________  Naloxone Nasal Spray  Why am I receiving this medication? [place] STOP ACT requires that all patients taking high dose opioids or at risk of opioids  respiratory depression, be prescribed an opioid reversal agent, such as Naloxone (AKA: Narcan).  What is this medication? NALOXONE (nal OX one) treats opioid overdose, which causes slow or shallow breathing, severe drowsiness, or trouble staying awake. Call emergency services after using this medication. You may need additional treatment. Naloxone works by reversing the effects of opioids. It belongs to a group of medications called opioid blockers.  COMMON BRAND NAME(S): Kloxxado, Narcan  What should I tell my care team before I take this medication? They need to know if you have any of these conditions: Heart disease Substance use disorder An unusual or allergic reaction to naloxone, other medications, foods, dyes, or preservatives Pregnant or trying to get pregnant Breast-feeding  When to use this medication? This medication is to be used for the treatment of respiratory depression (less than 8 breaths per minute) secondary to opioid overdose.   How to use this medication? This medication is for use in the nose. Lay the person on their back. Support their neck with your hand and allow the head to tilt back before giving the medication. The nasal spray should be given into 1 nostril. After giving the medication, move the person onto their side. Do not remove or test the nasal spray until ready to use. Get emergency medical help right away after giving the first dose of this medication, even if the person wakes up. You should be familiar with how to recognize the signs and symptoms of a narcotic overdose. If more doses are needed, give the additional dose in the other nostril. Talk to your care team about the use of this medication in children. While this medication may be prescribed for children as young as newborns for selected conditions, precautions do apply.  Naloxone Overdosage: If you think you have taken too much of this medicine contact a poison control center or emergency room at  once.  NOTE: This medicine is only for you. Do not share this medicine with others.  What if I miss a dose? This does not apply.  What may interact with this medication? This is only used during an emergency. No interactions are expected during emergency use.  This list may not describe all possible interactions. Give your health care provider a list of all the medicines, herbs, non-prescription drugs, or dietary supplements you use. Also tell them if you smoke, drink alcohol, or use illegal drugs. Some items may interact with your medicine.  What should I watch for while using this medication? Keep this medication ready for use in the case of an opioid overdose. Make sure that you have the phone number of your care team and local hospital ready. You may need to have additional doses of this medication. Each nasal spray contains a single dose. Some emergencies may require additional doses. After use, bring the treated person to the nearest hospital or call 911. Make sure the treating care team knows that the person has received a dose of this medication. You will receive additional instructions on what to do during and after use of this medication before an emergency occurs.  What side effects may I notice from receiving this medication? Side effects that you should report to your care team as soon as possible: Allergic reactions--skin rash, itching, hives, swelling of the face, lips, tongue, or throat Side effects that usually do not require medical attention (report these to your care team if they continue or are bothersome): Constipation Dryness or irritation inside the nose Headache Increase in blood pressure Muscle spasms Stuffy nose Toothache This list may not describe all possible side effects. Call your doctor for medical advice about side effects. You may report side effects to FDA at 1-800-FDA-1088.  Where should I keep my medication? Because this is an emergency medication, you  should keep it with you at all times.  Keep out of the reach of children and pets. Store between 20 and 25 degrees C (68 and 77 degrees F). Do not freeze. Throw away any unused medication after the expiration date. Keep in original box until ready to use.  NOTE: This sheet is a summary. It may not cover all possible information. If you have questions about this medicine, talk to your doctor, pharmacist, or health care provider.   2023 Elsevier/Gold Standard (2021-03-28 00:00:00)  ____________________________________________________________________________________________

## 2022-09-21 ENCOUNTER — Encounter: Payer: Self-pay | Admitting: Pain Medicine

## 2022-09-21 ENCOUNTER — Ambulatory Visit: Payer: Medicare Other | Attending: Pain Medicine | Admitting: Pain Medicine

## 2022-09-21 VITALS — BP 123/76 | HR 68 | Temp 98.4°F | Resp 18 | Ht 68.0 in | Wt 171.0 lb

## 2022-09-21 DIAGNOSIS — M5442 Lumbago with sciatica, left side: Secondary | ICD-10-CM

## 2022-09-21 DIAGNOSIS — G894 Chronic pain syndrome: Secondary | ICD-10-CM

## 2022-09-21 DIAGNOSIS — M79605 Pain in left leg: Secondary | ICD-10-CM | POA: Insufficient documentation

## 2022-09-21 DIAGNOSIS — G8929 Other chronic pain: Secondary | ICD-10-CM | POA: Diagnosis present

## 2022-09-21 DIAGNOSIS — M79601 Pain in right arm: Secondary | ICD-10-CM | POA: Diagnosis present

## 2022-09-21 DIAGNOSIS — M47816 Spondylosis without myelopathy or radiculopathy, lumbar region: Secondary | ICD-10-CM | POA: Insufficient documentation

## 2022-09-21 DIAGNOSIS — M79602 Pain in left arm: Secondary | ICD-10-CM | POA: Insufficient documentation

## 2022-09-21 DIAGNOSIS — Z79891 Long term (current) use of opiate analgesic: Secondary | ICD-10-CM | POA: Insufficient documentation

## 2022-09-21 DIAGNOSIS — M5137 Other intervertebral disc degeneration, lumbosacral region: Secondary | ICD-10-CM | POA: Diagnosis present

## 2022-09-21 DIAGNOSIS — M542 Cervicalgia: Secondary | ICD-10-CM | POA: Diagnosis present

## 2022-09-21 DIAGNOSIS — Z79899 Other long term (current) drug therapy: Secondary | ICD-10-CM

## 2022-09-21 DIAGNOSIS — M5030 Other cervical disc degeneration, unspecified cervical region: Secondary | ICD-10-CM | POA: Diagnosis not present

## 2022-09-21 MED ORDER — PREDNISONE 20 MG PO TABS: ORAL_TABLET | ORAL | 0 refills | Status: AC

## 2022-09-21 MED ORDER — OXYCODONE HCL 10 MG PO TABS: 10.0000 mg | ORAL_TABLET | Freq: Every day | ORAL | 0 refills | Status: DC

## 2022-09-21 NOTE — Progress Notes (Signed)
Nursing Pain Medication Assessment:  Safety precautions to be maintained throughout the outpatient stay will include: orient to surroundings, keep bed in low position, maintain call bell within reach at all times, provide assistance with transfer out of bed and ambulation.  Medication Inspection Compliance: Pill count conducted under aseptic conditions, in front of the patient. Neither the pills nor the bottle was removed from the patient's sight at any time. Once count was completed pills were immediately returned to the patient in their original bottle.  Medication: Oxycodone IR Pill/Patch Count:  59 of 150 pills remain Pill/Patch Appearance: Markings consistent with prescribed medication Bottle Appearance: Standard pharmacy container. Clearly labeled. Filled Date: 1 / 23 / 2024 Last Medication intake:  Today

## 2022-09-30 ENCOUNTER — Other Ambulatory Visit: Payer: Self-pay

## 2022-09-30 ENCOUNTER — Telehealth: Payer: Self-pay | Admitting: Pain Medicine

## 2022-09-30 DIAGNOSIS — Z79899 Other long term (current) drug therapy: Secondary | ICD-10-CM

## 2022-09-30 DIAGNOSIS — G8929 Other chronic pain: Secondary | ICD-10-CM

## 2022-09-30 DIAGNOSIS — Z79891 Long term (current) use of opiate analgesic: Secondary | ICD-10-CM

## 2022-09-30 DIAGNOSIS — G894 Chronic pain syndrome: Secondary | ICD-10-CM

## 2022-09-30 DIAGNOSIS — M47816 Spondylosis without myelopathy or radiculopathy, lumbar region: Secondary | ICD-10-CM

## 2022-09-30 DIAGNOSIS — M5137 Other intervertebral disc degeneration, lumbosacral region: Secondary | ICD-10-CM

## 2022-09-30 DIAGNOSIS — M5030 Other cervical disc degeneration, unspecified cervical region: Secondary | ICD-10-CM

## 2022-09-30 MED ORDER — OXYCODONE HCL 10 MG PO TABS: 10.0000 mg | ORAL_TABLET | Freq: Every day | ORAL | 0 refills | Status: DC

## 2022-09-30 NOTE — Telephone Encounter (Signed)
Refill request sent to Dr Dossie Arbour

## 2022-09-30 NOTE — Telephone Encounter (Signed)
[facility] states they somehow deleted the script for Oxycodone 10 mg  due to be filled tomorrow. They are asking if phys will resend to pharmacy.

## 2022-10-01 ENCOUNTER — Other Ambulatory Visit: Payer: Self-pay | Admitting: *Deleted

## 2022-10-01 DIAGNOSIS — M47816 Spondylosis without myelopathy or radiculopathy, lumbar region: Secondary | ICD-10-CM

## 2022-10-01 DIAGNOSIS — G8929 Other chronic pain: Secondary | ICD-10-CM

## 2022-10-01 DIAGNOSIS — Z79891 Long term (current) use of opiate analgesic: Secondary | ICD-10-CM

## 2022-10-01 DIAGNOSIS — M5137 Other intervertebral disc degeneration, lumbosacral region: Secondary | ICD-10-CM

## 2022-10-01 DIAGNOSIS — M5030 Other cervical disc degeneration, unspecified cervical region: Secondary | ICD-10-CM

## 2022-10-01 DIAGNOSIS — G894 Chronic pain syndrome: Secondary | ICD-10-CM

## 2022-10-01 DIAGNOSIS — Z79899 Other long term (current) drug therapy: Secondary | ICD-10-CM

## 2022-10-01 NOTE — Telephone Encounter (Signed)
Rx request sent to Dr. Dossie Arbour.

## 2022-10-19 ENCOUNTER — Encounter: Payer: Self-pay | Admitting: Cardiovascular Disease

## 2022-10-19 ENCOUNTER — Ambulatory Visit: Payer: Medicare Other | Attending: Cardiovascular Disease | Admitting: Cardiovascular Disease

## 2022-10-19 VITALS — BP 104/72 | HR 71 | Ht 68.0 in | Wt 181.6 lb

## 2022-10-19 DIAGNOSIS — I10 Essential (primary) hypertension: Secondary | ICD-10-CM | POA: Diagnosis not present

## 2022-10-19 DIAGNOSIS — E782 Mixed hyperlipidemia: Secondary | ICD-10-CM

## 2022-10-19 DIAGNOSIS — I272 Pulmonary hypertension, unspecified: Secondary | ICD-10-CM | POA: Diagnosis not present

## 2022-10-19 DIAGNOSIS — G4700 Insomnia, unspecified: Secondary | ICD-10-CM

## 2022-10-19 DIAGNOSIS — I25119 Atherosclerotic heart disease of native coronary artery with unspecified angina pectoris: Secondary | ICD-10-CM | POA: Diagnosis not present

## 2022-10-19 DIAGNOSIS — R400 Somnolence: Secondary | ICD-10-CM

## 2022-10-19 LAB — CBC
Hematocrit: 38.1 % (ref 37.5–51.0)
Hemoglobin: 13.1 g/dL (ref 13.0–17.7)
MCH: 27.9 pg (ref 26.6–33.0)
MCHC: 34.4 g/dL (ref 31.5–35.7)
MCV: 81 fL (ref 79–97)
Platelets: 166 10*3/uL (ref 150–450)
RBC: 4.69 x10E6/uL (ref 4.14–5.80)
RDW: 14.8 % (ref 11.6–15.4)
WBC: 4.4 10*3/uL (ref 3.4–10.8)

## 2022-10-19 NOTE — Patient Instructions (Addendum)
Medication Instructions:  Your physician recommends that you continue on your current medications as directed. Please refer to the Current Medication list given to you today.  *If you need a refill on your cardiac medications before your next appointment, please call your pharmacy*   Lab Work: CBC today If you have labs (blood work) drawn today and your tests are completely normal, you will receive your results only by: [place] (if you have MyChart) OR A paper copy in the mail If you have any lab test that is abnormal or we need to change your treatment, we will call you to review the results.   Testing/Procedures: R heart Catheterization Your physician has requested that you have a cardiac catheterization. Cardiac catheterization is used to diagnose and/or treat various heart conditions. Doctors may recommend this procedure for a number of different reasons. The most common reason is to evaluate chest pain. Chest pain can be a symptom of coronary artery disease (CAD), and cardiac catheterization can show whether plaque is narrowing or blocking your heart's arteries. This procedure is also used to evaluate the valves, as well as measure the blood flow and oxygen levels in different parts of your heart. For further information please visit [facility]. Please follow instruction sheet, as given.  ECHO Your physician has requested that you have an echocardiogram. Echocardiography is a painless test that uses sound waves to create images of your heart. It provides your doctor with information about the size and shape of your heart and how well your heart's chambers and valves are working. This procedure takes approximately one hour. There are no restrictions for this procedure. Please do NOT wear cologne, perfume, aftershave, or lotions (deodorant is allowed). Please arrive 15 minutes prior to your appointment time.  VQ scan (Test for lungs' ventilation and  perfusion)  Follow-Up: At [facility], you and your health needs are our priority.  As part of our continuing mission to provide you with exceptional heart care, we have created designated Provider Care Teams.  These Care Teams include your primary Cardiologist (physician) and Advanced Practice Providers (APPs -  Physician Assistants and Nurse Practitioners) who all work together to provide you with the care you need, when you need it.  Your next appointment:   6 month(s)  Provider:   Sherren Mocha, MD     Other Instructions       Cardiac/Peripheral Catheterization   You are scheduled for a Cardiac Catheterization on Monday, April 1 with Dr. Sherren Mocha.  1. Please arrive at the Main Entrance A at [facility]: [place], [place] [address] on April 1 at 5:30 AM (This time is two hours before your procedure to ensure your preparation). Free valet parking service is available. You will check in at ADMITTING. The support person will be asked to wait in the waiting room.  It is OK to have someone drop you off and come back when you are ready to be discharged.        Special note: Every effort is made to have your procedure done on time. Please understand that emergencies sometimes delay scheduled procedures.   . 2. Diet: Do not eat solid foods after midnight.  You may have clear liquids until 5 AM the day of the procedure.  3. Labs: Today  4. Medication instructions in preparation for your procedure:   Contrast Allergy: No  DO NOT TAKE Hydrochlorothiazide or Spironolactone the morning of procedure  On the morning of your  procedure, take Aspirin 81 mg and Clopidogrel/Plavix 75mg  any morning medicines NOT listed above.  You may use sips of water.  5. Plan to go home the same day, you will only stay overnight if medically necessary. 6. You MUST have a responsible adult to drive you home. 7. An adult MUST be with you the first 24 hours after you  arrive home. 8. Bring a current list of your medications, and the last time and date medication taken. 9. Bring ID and current insurance cards. 10.Please wear clothes that are easy to get on and off and wear slip-on shoes.  Thank you for allowing [place] to care for you!   -- [facility]

## 2022-10-19 NOTE — H&P (View-Only) (Signed)
Cardiology Office Note:    Date:  10/19/2022   ID:  [name] [name], DOB 09/07/1950, MRN [id number]  PCP:  Grisso, Greg A., MD   [facility] Providers Cardiologist:  Lenox Bink, MD     Referring MD: Grisso, Greg A., MD   Chief Complaint  Patient presents with   pulmonary hypertension    History of Present Illness:    [name] [name] is a 72 y.o. male with a hx of: Coronary artery disease  S/p CABG with early SVG failure S/p PCI of L-LAD anastomosis  Long term DAPT Hyperlipidemia  Hypertension  GERD Chronic pain S/p cholecystectomy Pulmonary hypertension  He is here alone today. He is doing okay but reports a syncopal episode in January and he was evaluated at [facility] with a negative workup per his report.  He otherwise has had no recent problems.  He denies chest pain, chest pressure, heart palpitations, shortness of breath, or leg swelling.  He denies orthopnea or PND.  He has had no other recent episodes of lightheadedness or dizziness.  Past Medical History:  Diagnosis Date   CAD (coronary artery disease)    CABG   Chronic pain syndrome    GERD (gastroesophageal reflux disease)    Hyperlipidemia    Hypertension    Radiculitis of right cervical region 09/04/2015    Past Surgical History:  Procedure Laterality Date   CHOLECYSTECTOMY     CORONARY ARTERY BYPASS GRAFT     multivessel   HERNIA REPAIR     LEFT HEART CATHETERIZATION WITH CORONARY/GRAFT ANGIOGRAM N/A 04/21/2013   Procedure: LEFT HEART CATHETERIZATION WITH CORONARY/GRAFT ANGIOGRAM;  Surgeon: Kellyn Mansfield D Delron Comer, MD;  Location: [facility] CATH LAB;  Service: Cardiovascular;  Laterality: N/A;   PCI 2009 with stenting of LIMA-LAD anastamosis      Current Medications: Current Meds  Medication Sig   aspirin 81 MG tablet Take 1 tablet (81 mg total) by mouth daily.   atorvastatin (LIPITOR) 20 MG tablet TAKE 1 TABLET BY MOUTH EVERY DAY   Cholecalciferol (VITAMIN D3) 50 MCG (2000 UT) capsule Take 1  capsule (2,000 Units total) by mouth daily.   clopidogrel (PLAVIX) 75 MG tablet TAKE 1 TABLET BY MOUTH EVERY DAY   hydrochlorothiazide (HYDRODIURIL) 25 MG tablet Take 25 mg by mouth daily.   isosorbide mononitrate (IMDUR) 30 MG 24 hr tablet Take 1 tablet (30 mg total) by mouth daily.   lidocaine (LIDODERM) 5 % Place 1 patch onto the skin daily. Remove & Discard patch within 12 hours or as directed by MD   magnesium oxide (MAG-OX) 400 MG tablet Take 1 tablet (400 mg total) by mouth 2 (two) times daily.   metoprolol succinate (TOPROL-XL) 50 MG 24 hr tablet TAKE WITH OR IMMEDIATELY FOLLOWING A MEAL.   naloxone (NARCAN) nasal spray 4 mg/0.1 mL Place 1 spray into the nose as needed for up to 365 doses (for opioid-induced respiratory depresssion). In case of emergency (overdose), spray once into each nostril. If no response within 3 minutes, repeat application and call 911.   nitroGLYCERIN (NITROSTAT) 0.4 MG SL tablet PLACE 1 TABLET (0.4 MG TOTAL) UNDER THE TONGUE EVERY 5 (FIVE) MINUTES AS NEEDED FOR CHEST PAIN.   Oxycodone HCl 10 MG TABS Take 1 tablet (10 mg total) by mouth 5 (five) times daily. Must last 30 days. (Patient taking differently: Take 10 mg by mouth in the morning, at noon, and at bedtime. Must last 30 days.)   pantoprazole (PROTONIX) 40 MG tablet TAKE   1 TABLET BY MOUTH EVERY DAY   spironolactone (ALDACTONE) 25 MG tablet Take 0.5 tablets (12.5 mg total) by mouth daily.   vitamin B-12 (CYANOCOBALAMIN) 1000 MCG tablet Take 2,000 mcg by mouth daily.     Allergies:   Gabapentin   Social History   Socioeconomic History   Marital status: Married    Spouse name: Not on file   Number of children: Not on file   Years of education: Not on file   Highest education level: Not on file  Occupational History   Not on file  Tobacco Use   Smoking status: Former   Smokeless tobacco: Never  Substance and Sexual Activity   Alcohol use: No    Alcohol/week: 0.0 standard drinks of alcohol   Drug  use: No   Sexual activity: Not on file  Other Topics Concern   Not on file  Social History Narrative   Not on file   Social Determinants of Health   Financial Resource Strain: Not on file  Food Insecurity: Not on file  Transportation Needs: Not on file  Physical Activity: Not on file  Stress: Not on file  Social Connections: Not on file     Family History: The patient's family history includes Multiple sclerosis in his mother; Stroke in his father.  ROS:   Please see the history of present illness.    Positive for chronic back pain, headaches, neck pain.  All other systems reviewed and are negative.  EKGs/Labs/Other Studies Reviewed:    The following studies were reviewed today: Cardiac Studies & Procedures       ECHOCARDIOGRAM  ECHOCARDIOGRAM COMPLETE 04/14/2022  Narrative ECHOCARDIOGRAM REPORT    Patient Name:   [name] [name] Date of Exam: 04/14/2022 Medical Rec #:  [id number]      Height:       68.0 in Accession #:    [id number]     Weight:       189.0 lb Date of Birth:  04/13/1951     BSA:          1.995 m Patient Age:    72 years       BP:           110/70 mmHg Patient Gender: M              HR:           64 bpm. Exam Location:  [address]  Procedure: 2D Echo, Cardiac Doppler and Color Doppler  Indications:    I27.20 Pulmonary Hypertension  History:        Patient has prior history of Echocardiogram examinations, most recent 01/22/2021. CAD; Risk Factors:Hypertension and Dyslipidemia.  Sonographer:    NaTashia Rodgers-Jones RDCS Referring Phys: [id number] Patience Nuzzo  IMPRESSIONS   1. Left ventricular ejection fraction, by estimation, is 60 to 65%. The left ventricle has normal function. The left ventricle has no regional wall motion abnormalities. Left ventricular diastolic parameters are consistent with Grade I diastolic dysfunction (impaired relaxation). There is the interventricular septum is flattened in systole and diastole, consistent with right  ventricular pressure and volume overload. 2. Right ventricular systolic function is normal. The right ventricular size is moderately enlarged. There is mildly elevated pulmonary artery systolic pressure. The estimated right ventricular systolic pressure is 42.6 mmHg. 3. Left atrial size was mildly dilated. 4. Right atrial size was severely dilated. 5. The mitral valve is normal in structure. No evidence of mitral valve regurgitation. No evidence of mitral stenosis. 6.   Tricuspid valve regurgitation is moderate to severe. 7. The aortic valve is tricuspid. Aortic valve regurgitation is trivial. No aortic stenosis is present. 8. The inferior vena cava is dilated in size with >50% respiratory variability, suggesting right atrial pressure of 8 mmHg.  Comparison(s): No significant change from prior study. Prior images reviewed side by side.  FINDINGS Left Ventricle: Left ventricular ejection fraction, by estimation, is 60 to 65%. The left ventricle has normal function. The left ventricle has no regional wall motion abnormalities. The left ventricular internal cavity size was normal in size. There is no left ventricular hypertrophy. The interventricular septum is flattened in systole and diastole, consistent with right ventricular pressure and volume overload. Left ventricular diastolic parameters are consistent with Grade I diastolic dysfunction (impaired relaxation).  Right Ventricle: The right ventricular size is moderately enlarged. No increase in right ventricular wall thickness. Right ventricular systolic function is normal. There is mildly elevated pulmonary artery systolic pressure. The tricuspid regurgitant velocity is 2.94 m/s, and with an assumed right atrial pressure of 8 mmHg, the estimated right ventricular systolic pressure is 42.6 mmHg.  Left Atrium: Left atrial size was mildly dilated.  Right Atrium: Right atrial size was severely dilated.  Pericardium: There is no evidence of  pericardial effusion.  Mitral Valve: The mitral valve is normal in structure. No evidence of mitral valve regurgitation. No evidence of mitral valve stenosis.  Tricuspid Valve: The tricuspid valve is normal in structure. Tricuspid valve regurgitation is moderate to severe. No evidence of tricuspid stenosis.  Aortic Valve: The aortic valve is tricuspid. Aortic valve regurgitation is trivial. Aortic regurgitation PHT measures 450 msec. No aortic stenosis is present.  Pulmonic Valve: The pulmonic valve was normal in structure. Pulmonic valve regurgitation is trivial. No evidence of pulmonic stenosis.  Aorta: The aortic root is normal in size and structure.  Venous: The inferior vena cava is dilated in size with greater than 50% respiratory variability, suggesting right atrial pressure of 8 mmHg.  IAS/Shunts: No atrial level shunt detected by color flow Doppler.   LEFT VENTRICLE PLAX 2D LVIDd:         4.90 cm   Diastology LVIDs:         3.00 cm   LV e' medial:    7.18 cm/s LV PW:         0.90 cm   LV E/e' medial:  11.9 LV IVS:        0.90 cm   LV e' lateral:   11.70 cm/s LVOT diam:     2.00 cm   LV E/e' lateral: 7.3 LV SV:         68 LV SV Index:   34 LVOT Area:     3.14 cm   RIGHT VENTRICLE            IVC RV Basal diam:  5.90 cm    IVC diam: 2.40 cm RV Mid diam:    4.20 cm RV S prime:     9.84 cm/s TAPSE (M-mode): 1.6 cm  LEFT ATRIUM             Index        RIGHT ATRIUM           Index LA diam:        4.90 cm 2.46 cm/m   RA Area:     22.40 cm LA Vol (A2C):   52.7 ml 26.41 ml/m  RA Volume:   71.50 ml  35.84 ml/m LA Vol (A4C):     61.0 ml 30.57 ml/m LA Biplane Vol: 57.6 ml 28.87 ml/m AORTIC VALVE LVOT Vmax:   93.00 cm/s LVOT Vmean:  58.950 cm/s LVOT VTI:    0.216 m AI PHT:      450 msec  AORTA Ao Root diam: 3.50 cm Ao Asc diam:  3.80 cm  MITRAL VALVE               TRICUSPID VALVE MV Area (PHT): 4.49 cm    TR Peak grad:   34.6 mmHg MV Decel Time: 169 msec    TR  Vmax:        294.00 cm/s MV E velocity: 85.15 cm/s MV A velocity: 58.70 cm/s  SHUNTS MV E/A ratio:  1.45        Systemic VTI:  0.22 m Systemic Diam: 2.00 cm  Mark Skains MD Electronically signed by Mark Skains MD Signature Date/Time: 04/14/2022/2:20:59 PM    Final              EKG:  EKG is ordered today.  The ekg ordered today demonstrates NSR with PAC's, RBBB, HR 71 bpm  Recent Labs: No results found for requested labs within last 365 days.  Recent Lipid Panel    Component Value Date/Time   CHOL 114 07/17/2020 0948   TRIG 62 07/17/2020 0948   HDL 48 07/17/2020 0948   CHOLHDL 2.4 07/17/2020 0948   CHOLHDL 3.8 06/18/2016 0954   VLDL 19 06/18/2016 0954   LDLCALC 52 07/17/2020 0948   LDLDIRECT 92.7 07/04/2009 0946     Risk Assessment/Calculations:           STOP-Bang Score:  6       Physical Exam:    VS:  BP 104/72   Pulse 71   Ht 5' 8" (1.727 m)   Wt 181 lb 9.6 oz (82.4 kg)   SpO2 98%   BMI 27.61 kg/m     Wt Readings from Last 3 Encounters:  10/19/22 181 lb 9.6 oz (82.4 kg)  09/21/22 171 lb (77.6 kg)  06/22/22 187 lb (84.8 kg)     GEN:  Well nourished, well developed in no acute distress HEENT: Normal NECK: No JVD; No carotid bruits LYMPHATICS: No lymphadenopathy CARDIAC: RRR, no murmurs, rubs, gallops RESPIRATORY:  Clear to auscultation without rales, wheezing or rhonchi  ABDOMEN: Soft, non-tender, non-distended MUSCULOSKELETAL:  No edema; No deformity  SKIN: Warm and dry NEUROLOGIC:  Alert and oriented x 3 PSYCHIATRIC:  Normal affect   ASSESSMENT:    1. Coronary artery disease involving native coronary artery of native heart with angina pectoris (HCC)   2. Pulmonary HTN (HCC)   3. Essential hypertension, benign   4. Mixed hyperlipidemia   5. Insomnia, unspecified type   6. Daytime somnolence    PLAN:    In order of problems listed above:  The patient appears stable at this time with no symptoms of angina.  He will continue on a regimen  of aspirin, clopidogrel, and atorvastatin.  He is also treated with isosorbide and metoprolol succinate. Personally reviewed his most recent echo images.  He has dilated right heart and a D-shaped septum.  He has undergone evaluation in the advanced heart failure clinic.  While he does not appear to have an obvious functional limitation, further testing is indicated.  I went back and reviewed the recommendations from the heart failure clinic with him.  He has not followed through on some of the testing he was supposed to undergo.  He will undergo   home sleep study and he has this with him at home.  Instructions were reviewed with him again today.  He will have a VQ scan to evaluate for chronic thromboembolic disease.  He will also have a right heart catheterization to define the degree of pulmonary hypertension, assess for intracardiac shunting, and provide full hemodynamic evaluation.  I reviewed risks, indications, and alternatives to right heart catheterization with the patient.  He understands and agrees to proceed. Well-controlled on current medical regimen.  No medication changes made today.  Antihypertensive medications include spironolactone, isosorbide, and hydrochlorothiazide. Treated with atorvastatin.  Lipids followed by his PCP.  Her last lipids on file show an LDL cholesterol of 52. Pending sleep study Pending sleep study      Shared Decision Making/Informed Consent The risks [stroke (1 in 1000), death (1 in 1000), kidney failure [usually temporary] (1 in 500), bleeding (1 in 200), allergic reaction [possibly serious] (1 in 200)], benefits (diagnostic support and management of coronary artery disease) and alternatives of a cardiac catheterization were discussed in detail with Mr. Son and he is willing to proceed.    Medication Adjustments/Labs and Tests Ordered: Current medicines are reviewed at length with the patient today.  Concerns regarding medicines are outlined above.  Orders  Placed This Encounter  Procedures   NM Pulmonary Perf and Vent   CBC   EKG 12-Lead   ECHOCARDIOGRAM COMPLETE   [place] Sleep Study   No orders of the defined types were placed in this encounter.   Patient Instructions  Medication Instructions:  Your physician recommends that you continue on your current medications as directed. Please refer to the Current Medication list given to you today.  *If you need a refill on your cardiac medications before your next appointment, please call your pharmacy*   Lab Work: CBC today If you have labs (blood work) drawn today and your tests are completely normal, you will receive your results only by: MyChart Message (if you have MyChart) OR A paper copy in the mail If you have any lab test that is abnormal or we need to change your treatment, we will call you to review the results.   Testing/Procedures: R heart Catheterization Your physician has requested that you have a cardiac catheterization. Cardiac catheterization is used to diagnose and/or treat various heart conditions. Doctors may recommend this procedure for a number of different reasons. The most common reason is to evaluate chest pain. Chest pain can be a symptom of coronary artery disease (CAD), and cardiac catheterization can show whether plaque is narrowing or blocking your heart's arteries. This procedure is also used to evaluate the valves, as well as measure the blood flow and oxygen levels in different parts of your heart. For further information please visit www.cardiosmart.org. Please follow instruction sheet, as given.  ECHO Your physician has requested that you have an echocardiogram. Echocardiography is a painless test that uses sound waves to create images of your heart. It provides your doctor with information about the size and shape of your heart and how well your heart's chambers and valves are working. This procedure takes approximately one hour. There are no restrictions  for this procedure. Please do NOT wear cologne, perfume, aftershave, or lotions (deodorant is allowed). Please arrive 15 minutes prior to your appointment time.  VQ scan (Test for lungs' ventilation and perfusion)  Follow-Up: At [facility], you and your health needs are our priority.  As part of our continuing mission to provide you with exceptional   heart care, we have created designated Provider Care Teams.  These Care Teams include your primary Cardiologist (physician) and Advanced Practice Providers (APPs -  Physician Assistants and Nurse Practitioners) who all work together to provide you with the care you need, when you need it.  Your next appointment:   6 month(s)  Provider:   Shaheed Schmuck, MD     Other Instructions       Cardiac/Peripheral Catheterization   You are scheduled for a Cardiac Catheterization on Monday, April 1 with Dr. Seichi Kaufhold.  1. Please arrive at the Main Entrance A at [facility]: [address] [place], [place] [address] on April 1 at 5:30 AM (This time is two hours before your procedure to ensure your preparation). Free valet parking service is available. You will check in at ADMITTING. The support person will be asked to wait in the waiting room.  It is OK to have someone drop you off and come back when you are ready to be discharged.        Special note: Every effort is made to have your procedure done on time. Please understand that emergencies sometimes delay scheduled procedures.   . 2. Diet: Do not eat solid foods after midnight.  You may have clear liquids until 5 AM the day of the procedure.  3. Labs: Today  4. Medication instructions in preparation for your procedure:   Contrast Allergy: No  DO NOT TAKE Hydrochlorothiazide or Spironolactone the morning of procedure  On the morning of your procedure, take Aspirin 81 mg and Clopidogrel/Plavix 75mg any morning medicines NOT listed above.  You may use sips of  water.  5. Plan to go home the same day, you will only stay overnight if medically necessary. 6. You MUST have a responsible adult to drive you home. 7. An adult MUST be with you the first 24 hours after you arrive home. 8. Bring a current list of your medications, and the last time and date medication taken. 9. Bring ID and current insurance cards. 10.Please wear clothes that are easy to get on and off and wear slip-on shoes.  Thank you for allowing us to care for you!   -- [facility]    Signed, Willia Lampert, MD  10/19/2022 1:28 PM    [facility]  

## 2022-10-19 NOTE — Progress Notes (Signed)
Cardiology Office Note:    Date:  10/19/2022   ID:  EMZIE GOKEY, DOB Jan 30, 1951, MRN [id number]  PCP:  Raina Mina., MD   Lydia Providers Cardiologist:  Sherren Mocha, MD     Referring MD: Raina Mina., MD   Chief Complaint  Patient presents with   pulmonary hypertension    History of Present Illness:    [name] [name] is a 72 y.o. male with a hx of: Coronary artery disease  S/p CABG with early SVG failure S/p PCI of L-LAD anastomosis  Long term DAPT Hyperlipidemia  Hypertension  GERD Chronic pain S/p cholecystectomy Pulmonary hypertension  He is here alone today. He is doing okay but reports a syncopal episode in January and he was evaluated at [facility] with a negative workup per his report.  He otherwise has had no recent problems.  He denies chest pain, chest pressure, heart palpitations, shortness of breath, or leg swelling.  He denies orthopnea or PND.  He has had no other recent episodes of lightheadedness or dizziness.  Past Medical History:  Diagnosis Date   CAD (coronary artery disease)    CABG   Chronic pain syndrome    GERD (gastroesophageal reflux disease)    Hyperlipidemia    Hypertension    Radiculitis of right cervical region 09/04/2015    Past Surgical History:  Procedure Laterality Date   CHOLECYSTECTOMY     CORONARY ARTERY BYPASS GRAFT     multivessel   HERNIA REPAIR     LEFT HEART CATHETERIZATION WITH CORONARY/GRAFT ANGIOGRAM N/A 04/21/2013   Procedure: LEFT HEART CATHETERIZATION WITH Beatrix Fetters;  Surgeon: Blane Ohara, MD;  Location: [facility] CATH LAB;  Service: Cardiovascular;  Laterality: N/A;   PCI 2009 with stenting of LIMA-LAD anastamosis      Current Medications: Current Meds  Medication Sig   aspirin 81 MG tablet Take 1 tablet (81 mg total) by mouth daily.   atorvastatin (LIPITOR) 20 MG tablet TAKE 1 TABLET BY MOUTH EVERY DAY   Cholecalciferol (VITAMIN D3) 50 MCG (2000 UT) capsule Take 1  capsule (2,000 Units total) by mouth daily.   clopidogrel (PLAVIX) 75 MG tablet TAKE 1 TABLET BY MOUTH EVERY DAY   hydrochlorothiazide (HYDRODIURIL) 25 MG tablet Take 25 mg by mouth daily.   isosorbide mononitrate (IMDUR) 30 MG 24 hr tablet Take 1 tablet (30 mg total) by mouth daily.   lidocaine (LIDODERM) 5 % Place 1 patch onto the skin daily. Remove & Discard patch within 12 hours or as directed by MD   magnesium oxide (MAG-OX) 400 MG tablet Take 1 tablet (400 mg total) by mouth 2 (two) times daily.   metoprolol succinate (TOPROL-XL) 50 MG 24 hr tablet TAKE WITH OR IMMEDIATELY FOLLOWING A MEAL.   naloxone (NARCAN) nasal spray 4 mg/0.1 mL Place 1 spray into the nose as needed for up to 365 doses (for opioid-induced respiratory depresssion). In case of emergency (overdose), spray once into each nostril. If no response within 3 minutes, repeat application and call [phone].   nitroGLYCERIN (NITROSTAT) 0.4 MG SL tablet PLACE 1 TABLET (0.4 MG TOTAL) UNDER THE TONGUE EVERY 5 (FIVE) MINUTES AS NEEDED FOR CHEST PAIN.   Oxycodone HCl 10 MG TABS Take 1 tablet (10 mg total) by mouth 5 (five) times daily. Must last 30 days. (Patient taking differently: Take 10 mg by mouth in the morning, at noon, and at bedtime. Must last 30 days.)   pantoprazole (PROTONIX) 40 MG tablet TAKE  1 TABLET BY MOUTH EVERY DAY   spironolactone (ALDACTONE) 25 MG tablet Take 0.5 tablets (12.5 mg total) by mouth daily.   vitamin B-12 (CYANOCOBALAMIN) 1000 MCG tablet Take 2,000 mcg by mouth daily.     Allergies:   Gabapentin   Social History   Socioeconomic History   Marital status: Married    Spouse name: Not on file   Number of children: Not on file   Years of education: Not on file   Highest education level: Not on file  Occupational History   Not on file  Tobacco Use   Smoking status: Former   Smokeless tobacco: Never  Substance and Sexual Activity   Alcohol use: No    Alcohol/week: 0.0 standard drinks of alcohol   Drug  use: No   Sexual activity: Not on file  Other Topics Concern   Not on file  Social History Narrative   Not on file   Social Determinants of Health   Financial Resource Strain: Not on file  Food Insecurity: Not on file  Transportation Needs: Not on file  Physical Activity: Not on file  Stress: Not on file  Social Connections: Not on file     Family History: The patient's family history includes Multiple sclerosis in his mother; Stroke in his father.  ROS:   Please see the history of present illness.    Positive for chronic back pain, headaches, neck pain.  All other systems reviewed and are negative.  EKGs/Labs/Other Studies Reviewed:    The following studies were reviewed today: Cardiac Studies & Procedures       ECHOCARDIOGRAM  ECHOCARDIOGRAM COMPLETE 04/14/2022  Narrative ECHOCARDIOGRAM REPORT    Patient Name:   [name] [name] Date of Exam: 04/14/2022 Medical Rec #:  [id number]      Height:       68.0 in Accession #:    [id number]     Weight:       189.0 lb Date of Birth:  [birth year]     BSA:          1.995 m Patient Age:    35 years       BP:           110/70 mmHg Patient Gender: M              HR:           64 bpm. Exam Location:  [facility]  Procedure: 2D Echo, Cardiac Doppler and Color Doppler  Indications:    I27.20 Pulmonary Hypertension  History:        Patient has prior history of Echocardiogram examinations, most recent 01/22/2021. CAD; Risk Factors:Hypertension and Dyslipidemia.  Sonographer:    Wilford Sports Rodgers-Jones RDCS Referring Phys: [place]   1. Left ventricular ejection fraction, by estimation, is 60 to 65%. The left ventricle has normal function. The left ventricle has no regional wall motion abnormalities. Left ventricular diastolic parameters are consistent with Grade I diastolic dysfunction (impaired relaxation). There is the interventricular septum is flattened in systole and diastole, consistent with right  ventricular pressure and volume overload. 2. Right ventricular systolic function is normal. The right ventricular size is moderately enlarged. There is mildly elevated pulmonary artery systolic pressure. The estimated right ventricular systolic pressure is [id number] mmHg. 3. Left atrial size was mildly dilated. 4. Right atrial size was severely dilated. 5. The mitral valve is normal in structure. No evidence of mitral valve regurgitation. No evidence of mitral stenosis. 6.  Tricuspid valve regurgitation is moderate to severe. 7. The aortic valve is tricuspid. Aortic valve regurgitation is trivial. No aortic stenosis is present. 8. The inferior vena cava is dilated in size with >50% respiratory variability, suggesting right atrial pressure of 8 mmHg.  Comparison(s): No significant change from prior study. Prior images reviewed side by side.  FINDINGS Left Ventricle: Left ventricular ejection fraction, by estimation, is 60 to 65%. The left ventricle has normal function. The left ventricle has no regional wall motion abnormalities. The left ventricular internal cavity size was normal in size. There is no left ventricular hypertrophy. The interventricular septum is flattened in systole and diastole, consistent with right ventricular pressure and volume overload. Left ventricular diastolic parameters are consistent with Grade I diastolic dysfunction (impaired relaxation).  Right Ventricle: The right ventricular size is moderately enlarged. No increase in right ventricular wall thickness. Right ventricular systolic function is normal. There is mildly elevated pulmonary artery systolic pressure. The tricuspid regurgitant velocity is 2.94 m/s, and with an assumed right atrial pressure of 8 mmHg, the estimated right ventricular systolic pressure is [id number] mmHg.  Left Atrium: Left atrial size was mildly dilated.  Right Atrium: Right atrial size was severely dilated.  Pericardium: There is no evidence of  pericardial effusion.  Mitral Valve: The mitral valve is normal in structure. No evidence of mitral valve regurgitation. No evidence of mitral valve stenosis.  Tricuspid Valve: The tricuspid valve is normal in structure. Tricuspid valve regurgitation is moderate to severe. No evidence of tricuspid stenosis.  Aortic Valve: The aortic valve is tricuspid. Aortic valve regurgitation is trivial. Aortic regurgitation PHT measures 450 msec. No aortic stenosis is present.  Pulmonic Valve: The pulmonic valve was normal in structure. Pulmonic valve regurgitation is trivial. No evidence of pulmonic stenosis.  Aorta: The aortic root is normal in size and structure.  Venous: The inferior vena cava is dilated in size with greater than 50% respiratory variability, suggesting right atrial pressure of 8 mmHg.  IAS/Shunts: No atrial level shunt detected by color flow Doppler.   LEFT VENTRICLE PLAX 2D LVIDd:         4.90 cm   Diastology LVIDs:         3.00 cm   LV e' medial:    7.18 cm/s LV PW:         0.90 cm   LV E/e' medial:  11.9 LV IVS:        0.90 cm   LV e' lateral:   11.70 cm/s LVOT diam:     2.00 cm   LV E/e' lateral: 7.3 LV SV:         68 LV SV Index:   34 LVOT Area:     3.14 cm   RIGHT VENTRICLE            IVC RV Basal diam:  5.90 cm    IVC diam: 2.40 cm RV Mid diam:    4.20 cm RV S prime:     9.84 cm/s TAPSE (M-mode): 1.6 cm  LEFT ATRIUM             Index        RIGHT ATRIUM           Index LA diam:        4.90 cm 2.46 cm/m   RA Area:     22.40 cm LA Vol (A2C):   52.7 ml 26.41 ml/m  RA Volume:   71.50 ml  35.84 ml/m LA Vol (A4C):  61.0 ml 30.57 ml/m LA Biplane Vol: 57.6 ml 28.87 ml/m AORTIC VALVE LVOT Vmax:   93.00 cm/s LVOT Vmean:  58.950 cm/s LVOT VTI:    0.216 m AI PHT:      450 msec  AORTA Ao Root diam: 3.50 cm Ao Asc diam:  3.80 cm  MITRAL VALVE               TRICUSPID VALVE MV Area (PHT): 4.49 cm    TR Peak grad:   34.6 mmHg MV Decel Time: 169 msec    TR  Vmax:        294.00 cm/s MV E velocity: 85.15 cm/s MV A velocity: 58.70 cm/s  SHUNTS MV E/A ratio:  1.45        Systemic VTI:  0.22 m Systemic Diam: 2.00 cm  Candee Furbish MD Electronically signed by Candee Furbish MD Signature Date/Time: 04/14/2022/2:20:59 PM    Final              EKG:  EKG is ordered today.  The ekg ordered today demonstrates NSR with PAC's, RBBB, HR 71 bpm  Recent Labs: No results found for requested labs within last 365 days.  Recent Lipid Panel    Component Value Date/Time   CHOL 114 07/17/2020 0948   TRIG 62 07/17/2020 0948   HDL 48 07/17/2020 0948   CHOLHDL 2.4 07/17/2020 0948   CHOLHDL 3.8 06/18/2016 0954   VLDL 19 06/18/2016 0954   LDLCALC 52 07/17/2020 0948   LDLDIRECT 92.7 07/04/2009 0946     Risk Assessment/Calculations:           STOP-Bang Score:  6       Physical Exam:    VS:  BP 104/72   Pulse 71   Ht 5\' 8"  (1.727 m)   Wt 181 lb 9.6 oz (82.4 kg)   SpO2 98%   BMI 27.61 kg/m     Wt Readings from Last 3 Encounters:  10/19/22 181 lb 9.6 oz (82.4 kg)  09/21/22 171 lb (77.6 kg)  06/22/22 187 lb (84.8 kg)     GEN:  Well nourished, well developed in no acute distress HEENT: Normal NECK: No JVD; No carotid bruits LYMPHATICS: No lymphadenopathy CARDIAC: RRR, no murmurs, rubs, gallops RESPIRATORY:  Clear to auscultation without rales, wheezing or rhonchi  ABDOMEN: Soft, non-tender, non-distended MUSCULOSKELETAL:  No edema; No deformity  SKIN: Warm and dry NEUROLOGIC:  Alert and oriented x 3 PSYCHIATRIC:  Normal affect   ASSESSMENT:    1. Coronary artery disease involving native coronary artery of native heart with angina pectoris ([place])   2. Pulmonary HTN ([place])   3. Essential hypertension, benign   4. Mixed hyperlipidemia   5. Insomnia, unspecified type   6. Daytime somnolence    PLAN:    In order of problems listed above:  The patient appears stable at this time with no symptoms of angina.  He will continue on a regimen  of aspirin, clopidogrel, and atorvastatin.  He is also treated with isosorbide and metoprolol succinate. Personally reviewed his most recent echo images.  He has dilated right heart and a D-shaped septum.  He has undergone evaluation in the advanced heart failure clinic.  While he does not appear to have an obvious functional limitation, further testing is indicated.  I went back and reviewed the recommendations from the heart failure clinic with him.  He has not followed through on some of the testing he was supposed to undergo.  He will undergo  home sleep study and he has this with him at home.  Instructions were reviewed with him again today.  He will have a VQ scan to evaluate for chronic thromboembolic disease.  He will also have a right heart catheterization to define the degree of pulmonary hypertension, assess for intracardiac shunting, and provide full hemodynamic evaluation.  I reviewed risks, indications, and alternatives to right heart catheterization with the patient.  He understands and agrees to proceed. Well-controlled on current medical regimen.  No medication changes made today.  Antihypertensive medications include spironolactone, isosorbide, and hydrochlorothiazide. Treated with atorvastatin.  Lipids followed by his PCP.  Her last lipids on file show an LDL cholesterol of 52. Pending sleep study Pending sleep study      Shared Decision Making/Informed Consent The risks [stroke (1 in 1000), death (1 in 1000), kidney failure [usually temporary] (1 in 500), bleeding (1 in 200), allergic reaction [possibly serious] (1 in 200)], benefits (diagnostic support and management of coronary artery disease) and alternatives of a cardiac catheterization were discussed in detail with Mr. [place] and he is willing to proceed.    Medication Adjustments/Labs and Tests Ordered: Current medicines are reviewed at length with the patient today.  Concerns regarding medicines are outlined above.  Orders  Placed This Encounter  Procedures   NM Pulmonary Perf and Vent   CBC   EKG 12-Lead   ECHOCARDIOGRAM COMPLETE   Itamar Sleep Study   No orders of the defined types were placed in this encounter.   Patient Instructions  Medication Instructions:  Your physician recommends that you continue on your current medications as directed. Please refer to the Current Medication list given to you today.  *If you need a refill on your cardiac medications before your next appointment, please call your pharmacy*   Lab Work: CBC today If you have labs (blood work) drawn today and your tests are completely normal, you will receive your results only by: [facility] (if you have MyChart) OR A paper copy in the mail If you have any lab test that is abnormal or we need to change your treatment, we will call you to review the results.   Testing/Procedures: R heart Catheterization Your physician has requested that you have a cardiac catheterization. Cardiac catheterization is used to diagnose and/or treat various heart conditions. Doctors may recommend this procedure for a number of different reasons. The most common reason is to evaluate chest pain. Chest pain can be a symptom of coronary artery disease (CAD), and cardiac catheterization can show whether plaque is narrowing or blocking your heart's arteries. This procedure is also used to evaluate the valves, as well as measure the blood flow and oxygen levels in different parts of your heart. For further information please visit [email or web address][facility][email or web address]. Please follow instruction sheet, as given.  ECHO Your physician has requested that you have an echocardiogram. Echocardiography is a painless test that uses sound waves to create images of your heart. It provides your doctor with information about the size and shape of your heart and how well your heart's chambers and valves are working. This procedure takes approximately one hour. There are no restrictions  for this procedure. Please do NOT wear cologne, perfume, aftershave, or lotions (deodorant is allowed). Please arrive 15 minutes prior to your appointment time.  VQ scan (Test for lungs' ventilation and perfusion)  Follow-Up: At [facility], you and your health needs are our priority.  As part of our continuing mission to provide you with exceptional  heart care, we have created designated Provider Care Teams.  These Care Teams include your primary Cardiologist (physician) and Advanced Practice Providers (APPs -  Physician Assistants and Nurse Practitioners) who all work together to provide you with the care you need, when you need it.  Your next appointment:   6 month(s)  Provider:   Sherren Mocha, MD     Other Instructions       Cardiac/Peripheral Catheterization   You are scheduled for a Cardiac Catheterization on Monday, April 1 with Dr. Sherren Mocha.  1. Please arrive at the Main Entrance A at [facility]: [place], [place] [address] on April 1 at 5:30 AM (This time is two hours before your procedure to ensure your preparation). Free valet parking service is available. You will check in at ADMITTING. The support person will be asked to wait in the waiting room.  It is OK to have someone drop you off and come back when you are ready to be discharged.        Special note: Every effort is made to have your procedure done on time. Please understand that emergencies sometimes delay scheduled procedures.   . 2. Diet: Do not eat solid foods after midnight.  You may have clear liquids until 5 AM the day of the procedure.  3. Labs: Today  4. Medication instructions in preparation for your procedure:   Contrast Allergy: No  DO NOT TAKE Hydrochlorothiazide or Spironolactone the morning of procedure  On the morning of your procedure, take Aspirin 81 mg and Clopidogrel/Plavix 75mg  any morning medicines NOT listed above.  You may use sips of  water.  5. Plan to go home the same day, you will only stay overnight if medically necessary. 6. You MUST have a responsible adult to drive you home. 7. An adult MUST be with you the first 24 hours after you arrive home. 8. Bring a current list of your medications, and the last time and date medication taken. 9. Bring ID and current insurance cards. 10.Please wear clothes that are easy to get on and off and wear slip-on shoes.  Thank you for allowing [place] to care for you!   -- [facility] Invasive Cardiovascular services    Signed, Sherren Mocha, MD  10/19/2022 1:28 PM    Deputy

## 2022-10-22 ENCOUNTER — Telehealth: Payer: Self-pay | Admitting: *Deleted

## 2022-10-22 NOTE — Telephone Encounter (Signed)
Prior Authorization for ITAMAR sent to [employer] via web portal. Tracking Number . READY- NO PA REQ 

## 2022-10-23 NOTE — Telephone Encounter (Signed)
I s/w the pt and his wife in regard to Itamar sleep study. Pt was given device last year and never did the study. Pt was here 10/19/22 for appt with Dr. Burt Knack. Pt tells me that he told MD he still had the device from last year. I did state to the pt the importance of following through with the doctors orders. I tried to help the pt and his wife download the [facility] app on his phone. Pt's wife took the information down what to do and will try to get it on his phone. I also gave them the # [phone] if they have any questions or problems. Pt asked me if the device from last year will still  work. I honestly stated not sure, but I do not see why not. Pt will try to do sleep study this weekend. Pt has been given PIN# [id number].

## 2022-10-23 NOTE — Telephone Encounter (Signed)
I called [name] [name] [phone] to confirm if the pt can still use the device from last year or does he need a new device. Per [employer] pt will need a new device. I called the pt back and s/w him and his wife and they are going to come by the office 10/27/22 11:30 to see me to pick up the new device. I will need to register the new device. Pt and his wife are agreeable to plan of care.

## 2022-10-27 NOTE — Telephone Encounter (Signed)
I s/w the pt who was supposed to come by the office today to pick up replacement Itamar device and return the old device, see previous notes. Pt said he cannot make it today, but will stop by the office tomorrow to pick up the device. I will leave replacement device at the front desk for pick up. I told pt that he will need to let the front desk know that the old device needs to be returned to me. Pt has the app on his phone for sleep study. Pt thanked me for the help. Pt will need to do sleep study by early next week.

## 2022-10-28 ENCOUNTER — Telehealth: Payer: Self-pay | Admitting: Cardiology

## 2022-10-28 NOTE — Telephone Encounter (Signed)
Patient is calling in to report he's not able to come by today to turn in and get the replacement for the Itamar device, due to the weather. He states if the weather permits he plans to come tomorrow.

## 2022-10-29 ENCOUNTER — Telehealth: Payer: Self-pay | Admitting: *Deleted

## 2022-10-29 NOTE — Telephone Encounter (Addendum)
Right Heart Cath scheduled at [facility] for: Monday November 02, 2022 9:30 AM Arrival time [facility] Main Entrance A at: 7:30 AM  Nothing to eat after midnight prior to procedure, clear liquids until 5 AM day of procedure.  Medication instructions: -Hold:  HCTZ and Spironolactone the morning of the procedure. -Other usual morning medications can be taken with sips of water.  Confirmed patient has responsible adult to drive home post procedure and be with patient first 24 hours after arriving home.  Plan to go home the same day, you will only stay overnight if medically necessary.  Unsuccessful calls placed to patient to review procedure instructions, no answer at mobile number, received message home number listed is non-working, no answer at spouse number listed.

## 2022-11-02 ENCOUNTER — Encounter (HOSPITAL_COMMUNITY): Admission: RE | Payer: Self-pay | Source: Home / Self Care

## 2022-11-02 ENCOUNTER — Encounter: Payer: Self-pay | Admitting: Cardiology

## 2022-11-02 ENCOUNTER — Ambulatory Visit (HOSPITAL_COMMUNITY): Admission: RE | Admit: 2022-11-02 | Payer: Medicare Other | Source: Home / Self Care | Admitting: Cardiovascular Disease

## 2022-11-02 SURGERY — RIGHT HEART CATH

## 2022-11-03 ENCOUNTER — Other Ambulatory Visit: Payer: Self-pay | Admitting: Pain Medicine

## 2022-11-03 DIAGNOSIS — M542 Cervicalgia: Secondary | ICD-10-CM

## 2022-11-05 NOTE — Telephone Encounter (Signed)
Left message for the pt to call me back today and let me know when he is going to come by the office to pick up the new sleep study device and return the old one. I checked at the front desk today and saw the new device is still there waiting to be picked up. I asked if pt would please be able to come by this week and pick up new device as we need to get this done for the doctor, as well as the authorization is only good for a certain amount of time.

## 2022-11-09 ENCOUNTER — Telehealth: Payer: Self-pay | Admitting: *Deleted

## 2022-11-09 NOTE — Telephone Encounter (Signed)
Patient did not show for Right Heart Cath 11/02/22, unsuccessful attempts to contact patient prior to [facility] to review instructions-see 10/29/22 phone note.  Call placed to patient today to discuss rescheduling Right Heart Cath, still no answer at mobile number listed, home number listed "not in service", no answer at mobile number listed for spouse, Marily Memos.

## 2022-11-10 NOTE — Telephone Encounter (Signed)
Spoke with patient this morning, states his wife had medical issue 11/02/22 and he was unable to get to [facility] for Right Heart Cath. At patient's request Right Heart Cath rescheduled to Wednesday November 18, 2022, arrive 9 AM (needs BMP/CBC) procedure time 11:30 AM.   Reviewed procedure instructions with patient and will also send MyChart message to patient with instructions.  Right Heart Cath scheduled at [facility] for: Wednesday November 18, 2022 11:30 AM Arrival time [facility] Main Entrance A at: 9 AM-needs BMP/CBC  Nothing to eat after midnight prior to procedure, clear liquids until 5 AM day of procedure.  Medication instructions: -Hold:  HCTZ and Spironolactone AM of procedure -Other usual morning medications can be taken with sips of water.  Confirmed patient has responsible adult to drive home post procedure and be with patient first 24 hours after arriving home.  Plan to go home the same day, you will only stay overnight if medically necessary.  Reviewed procedure instructions with patient.

## 2022-11-12 NOTE — Telephone Encounter (Signed)
FYI to DR. Excell Seltzer and Dema Severin RN

## 2022-11-12 NOTE — Telephone Encounter (Signed)
I checked the front desk this morning to see if the pt has come by the office to pick up new sleep study device, see previous notes. I left a message for the pt today that he needs to call me today to let me know when he is coming by to pick up the device so he can get the study done for the cardiologist. I will update the ordering provider as well.

## 2022-11-17 ENCOUNTER — Telehealth: Payer: Self-pay | Admitting: *Deleted

## 2022-11-17 NOTE — Telephone Encounter (Signed)
Right Heart Cath scheduled at [facility] for: Wednesday November 18, 2022 11:30 AM Arrival time [facility] Main Entrance A at: 9 AM-needs BMP/CBC  Nothing to eat after midnight prior to procedure, clear liquids until 5 AM day of procedure.  Medication instructions: -Hold:  HCTZ/Spironolactone-AM of procedure  -Other usual morning medications can be taken with sips of water.  Confirmed patient has responsible adult to drive home post procedure and be with patient first 24 hours after arriving home.  Plan to go home the same day, you will only stay overnight if medically necessary.  Reviewed procedure instructions with patient.

## 2022-11-18 ENCOUNTER — Ambulatory Visit (HOSPITAL_COMMUNITY)
Admission: RE | Admit: 2022-11-18 | Discharge: 2022-11-18 | Disposition: A | Discharge status: Home / Self Care | Payer: Medicare Other | Source: Ambulatory Visit | Attending: Cardiovascular Disease | Admitting: Cardiovascular Disease

## 2022-11-18 ENCOUNTER — Encounter (HOSPITAL_COMMUNITY)
Admission: RE | Disposition: A | Discharge status: Home / Self Care | Payer: Self-pay | Source: Ambulatory Visit | Attending: Cardiovascular Disease

## 2022-11-18 ENCOUNTER — Encounter (HOSPITAL_COMMUNITY): Payer: Self-pay | Admitting: Cardiovascular Disease

## 2022-11-18 DIAGNOSIS — Z951 Presence of aortocoronary bypass graft: Secondary | ICD-10-CM | POA: Diagnosis not present

## 2022-11-18 DIAGNOSIS — E782 Mixed hyperlipidemia: Secondary | ICD-10-CM | POA: Diagnosis not present

## 2022-11-18 DIAGNOSIS — K219 Gastro-esophageal reflux disease without esophagitis: Secondary | ICD-10-CM | POA: Insufficient documentation

## 2022-11-18 DIAGNOSIS — I272 Pulmonary hypertension, unspecified: Secondary | ICD-10-CM | POA: Diagnosis not present

## 2022-11-18 DIAGNOSIS — G4700 Insomnia, unspecified: Secondary | ICD-10-CM | POA: Diagnosis not present

## 2022-11-18 DIAGNOSIS — Z01812 Encounter for preprocedural laboratory examination: Secondary | ICD-10-CM

## 2022-11-18 DIAGNOSIS — G8929 Other chronic pain: Secondary | ICD-10-CM | POA: Diagnosis not present

## 2022-11-18 DIAGNOSIS — R400 Somnolence: Secondary | ICD-10-CM | POA: Insufficient documentation

## 2022-11-18 DIAGNOSIS — I2510 Atherosclerotic heart disease of native coronary artery without angina pectoris: Secondary | ICD-10-CM | POA: Diagnosis not present

## 2022-11-18 DIAGNOSIS — I10 Essential (primary) hypertension: Secondary | ICD-10-CM | POA: Insufficient documentation

## 2022-11-18 HISTORY — PX: RIGHT HEART CATH: CATH118263

## 2022-11-18 LAB — POCT I-STAT EG7
Acid-Base Excess: 5 mmol/L — ABNORMAL HIGH (ref 0.0–2.0)
Acid-Base Excess: 6 mmol/L — ABNORMAL HIGH (ref 0.0–2.0)
Bicarbonate: 31.2 mmol/L — ABNORMAL HIGH (ref 20.0–28.0)
Bicarbonate: 31.9 mmol/L — ABNORMAL HIGH (ref 20.0–28.0)
Calcium, Ion: 1.2 mmol/L (ref 1.15–1.40)
Calcium, Ion: 1.23 mmol/L (ref 1.15–1.40)
HCT: 37 % — ABNORMAL LOW (ref 39.0–52.0)
HCT: 37 % — ABNORMAL LOW (ref 39.0–52.0)
Hemoglobin: 12.6 g/dL — ABNORMAL LOW (ref 13.0–17.0)
Hemoglobin: 12.6 g/dL — ABNORMAL LOW (ref 13.0–17.0)
O2 Saturation: 69 %
O2 Saturation: 71 %
Potassium: 3.2 mmol/L — ABNORMAL LOW (ref 3.5–5.1)
Potassium: 3.2 mmol/L — ABNORMAL LOW (ref 3.5–5.1)
Sodium: 140 mmol/L (ref 135–145)
Sodium: 140 mmol/L (ref 135–145)
TCO2: 33 mmol/L — ABNORMAL HIGH (ref 22–32)
TCO2: 33 mmol/L — ABNORMAL HIGH (ref 22–32)
pCO2, Ven: 50.5 mmHg (ref 44–60)
pCO2, Ven: 51.3 mmHg (ref 44–60)
pH, Ven: 7.399 (ref 7.25–7.43)
pH, Ven: 7.401 (ref 7.25–7.43)
pO2, Ven: 37 mmHg (ref 32–45)
pO2, Ven: 38 mmHg (ref 32–45)

## 2022-11-18 LAB — CBC
HCT: 39.4 % (ref 39.0–52.0)
Hemoglobin: 12.8 g/dL — ABNORMAL LOW (ref 13.0–17.0)
MCH: 27.4 pg (ref 26.0–34.0)
MCHC: 32.5 g/dL (ref 30.0–36.0)
MCV: 84.4 fL (ref 80.0–100.0)
Platelets: 232 10*3/uL (ref 150–400)
RBC: 4.67 MIL/uL (ref 4.22–5.81)
RDW: 14.9 % (ref 11.5–15.5)
WBC: 5.9 10*3/uL (ref 4.0–10.5)
nRBC: 0 % (ref 0.0–0.2)

## 2022-11-18 LAB — POCT I-STAT, CHEM 8
BUN: 11 mg/dL (ref 8–23)
Calcium, Ion: 1.21 mmol/L (ref 1.15–1.40)
Chloride: 97 mmol/L — ABNORMAL LOW (ref 98–111)
Creatinine, Ser: 0.7 mg/dL (ref 0.61–1.24)
Glucose, Bld: 102 mg/dL — ABNORMAL HIGH (ref 70–99)
HCT: 39 % (ref 39.0–52.0)
Hemoglobin: 13.3 g/dL (ref 13.0–17.0)
Potassium: 3.4 mmol/L — ABNORMAL LOW (ref 3.5–5.1)
Sodium: 141 mmol/L (ref 135–145)
TCO2: 33 mmol/L — ABNORMAL HIGH (ref 22–32)

## 2022-11-18 SURGERY — RIGHT HEART CATH

## 2022-11-18 MED ORDER — SODIUM CHLORIDE 0.9% FLUSH: 3.0000 mL | Freq: Two times a day (BID) | INTRAVENOUS | Status: DC

## 2022-11-18 MED ORDER — FENTANYL CITRATE (PF) 100 MCG/2ML IJ SOLN
INTRAMUSCULAR | Status: DC | PRN
  Administered 2022-11-18: 25 ug via INTRAVENOUS

## 2022-11-18 MED ORDER — MIDAZOLAM HCL 2 MG/2ML IJ SOLN
INTRAMUSCULAR | Status: AC
  Filled 2022-11-18: qty 2

## 2022-11-18 MED ORDER — SODIUM CHLORIDE 0.9 % IV SOLN: 250.0000 mL | INTRAVENOUS | Status: DC | PRN

## 2022-11-18 MED ORDER — LABETALOL HCL 5 MG/ML IV SOLN: 10.0000 mg | INTRAVENOUS | Status: DC | PRN

## 2022-11-18 MED ORDER — FENTANYL CITRATE (PF) 100 MCG/2ML IJ SOLN
INTRAMUSCULAR | Status: AC
  Filled 2022-11-18: qty 2

## 2022-11-18 MED ORDER — SODIUM CHLORIDE 0.9 % IV SOLN: INTRAVENOUS | Status: DC

## 2022-11-18 MED ORDER — HYDRALAZINE HCL 20 MG/ML IJ SOLN: 10.0000 mg | INTRAMUSCULAR | Status: DC | PRN

## 2022-11-18 MED ORDER — SODIUM CHLORIDE 0.9% FLUSH: 3.0000 mL | INTRAVENOUS | Status: DC | PRN

## 2022-11-18 MED ORDER — MIDAZOLAM HCL 2 MG/2ML IJ SOLN
INTRAMUSCULAR | Status: DC | PRN
  Administered 2022-11-18: 1 mg via INTRAVENOUS

## 2022-11-18 MED ORDER — LIDOCAINE HCL (PF) 1 % IJ SOLN
INTRAMUSCULAR | Status: AC
  Filled 2022-11-18: qty 30

## 2022-11-18 MED ORDER — ONDANSETRON HCL 4 MG/2ML IJ SOLN: 4.0000 mg | Freq: Four times a day (QID) | INTRAMUSCULAR | Status: DC | PRN

## 2022-11-18 MED ORDER — LIDOCAINE HCL (PF) 1 % IJ SOLN
INTRAMUSCULAR | Status: DC | PRN
  Administered 2022-11-18: 5 mL via INTRADERMAL

## 2022-11-18 MED ORDER — ACETAMINOPHEN 325 MG PO TABS: 650.0000 mg | ORAL_TABLET | ORAL | Status: DC | PRN

## 2022-11-18 SURGICAL SUPPLY — 5 items
CATH BALLN WEDGE 5F 110CM (CATHETERS) IMPLANT
PACK CARDIAC CATHETERIZATION (CUSTOM PROCEDURE TRAY) IMPLANT
PROTECTION STATION PRESSURIZED (MISCELLANEOUS) ×1
SHEATH GLIDE SLENDER 4/5FR (SHEATH) IMPLANT
STATION PROTECTION PRESSURIZED (MISCELLANEOUS) IMPLANT

## 2022-11-18 NOTE — Interval H&P Note (Signed)
History and Physical Interval Note:  11/18/2022 11:41 AM  [name] [name]  has presented today for surgery, with the diagnosis of Pulmonary HTN.  The various methods of treatment have been discussed with the patient and family. After consideration of risks, benefits and other options for treatment, the patient has consented to  Procedure(s): RIGHT HEART CATH (N/A) as a surgical intervention.  The patient's history has been reviewed, patient examined, no change in status, stable for surgery.  I have reviewed the patient's chart and labs.  Questions were answered to the patient's satisfaction.     [name]

## 2022-11-18 NOTE — Discharge Instructions (Signed)
Activity Rest as told by your health care provider. Return to your normal activities as told by your health care provider. Ask your health care provider what activities are safe for you. May shower and remove bandage 24 hours after discharge. If you were given a sedative during the procedure, it can affect you for several hours. Do not drive or operate machinery until your health care provider says that it is safe. General instructions      Check your IV insertion area every day for signs of infection. Check for: Redness, swelling, or pain. Fluid or blood. Warmth. Pus or a bad smell. Take over-the-counter and prescription medicines only as told by your health care provider. Contact a health care provider if: You have redness, swelling, warmth, pus, or pain around the IV insertion site. 

## 2022-11-20 ENCOUNTER — Telehealth: Payer: Self-pay | Admitting: *Deleted

## 2022-11-20 NOTE — Telephone Encounter (Signed)
I s/w the pt today about Itamar sleep study that he has not come by the office to pick up the device, see previous notes. Pt said he has been sick this month and asked if I could call him back next week. I said that will be fine.

## 2022-11-20 NOTE — Telephone Encounter (Signed)
----- Message from Tarri Fuller, CMA sent at 11/12/2022  8:21 AM EDT ----- Regarding: ITAMAR NEEDS TO BE PICKED UP, SEE NOTES 11/12/22  8:20 AM Note FYI to DR. Excell Seltzer and Dema Severin RN     11/12/22  8:19 AM Note I checked the front desk this morning to see if the pt has come by the office to pick up new sleep study device, see previous notes. I left a message for the pt today that he needs to call me today to let me know when he is coming by to pick up the device so he can get the study done for the cardiologist. I will update the ordering provider as well.     11/12/22  8:15 AM You attempted to contact [name] [name] (Left Message)   November 05, 2022     11/05/22  9:23 AM Note Left message for the pt to call me back today and let me know when he is going to come by the office to pick up the new sleep study device and return the old one. I checked at the front desk today and saw the new device is still there waiting to be picked up. I asked if pt would please be able to come by this week and pick up new device as we need to get this done for the doctor, as well as the authorization is only good for a certain amount of time.     11/05/22  9:20 AM You attempted to contact [name] [name] (Left Message)   October 27, 2022    10/27/22  2:22 PM Note I s/w the pt who was supposed to come by the office today to pick up replacement Itamar device and return the old device, see previous notes. Pt said he cannot make it today, but will stop by the office tomorrow to pick up the device. I will leave replacement device at the front desk for pick up. I told pt that he will need to let the front desk know that the old device needs to be returned to me. Pt has the app on his phone for sleep study. Pt thanked me for the help. Pt will need to do sleep study by early next week.      10/27/22  2:19 PM You contacted [name] [name]   October 23, 2022    10/23/22 10:38 AM Note I called [name] [name] [phone] to  confirm if the pt can still use the device from last year or does he need a new device. Per [employer] pt will need a new device. I called the pt back and s/w him and his wife and they are going to come by the office 10/27/22 11:30 to see me to pick up the new device. I will need to register the new device. Pt and his wife are agreeable to plan of care.     10/23/22 10:34 AM You contacted [employer] company      10/23/22 10:17 AM Note I s/w the pt and his wife in regard to Itamar sleep study. Pt was given device last year and never did the study. Pt was here 10/19/22 for appt with Dr. Excell Seltzer. Pt tells me that he told MD he still had the device from last year. I did state to the pt the importance of following through with the doctors orders. I tried to help the pt and his wife download the [facility] app on his phone. Pt's wife took the  information down what to do and will try to get it on his phone. I also gave them the # [phone] if they have any questions or problems. Pt asked me if the device from last year will still  work. I honestly stated not sure, but I do not see why not. Pt will try to do sleep study this weekend. Pt has been given PIN# [id number].     10/23/22 10:09 AM You contacted [name] [name]   October 22, 2022  [name], CMA  to Me     10/22/22  6:21 PM READY- NO PA REQ  [name], CMA     10/22/22  6:21 PM Note Prior Authorization for [facility] sent to [facility] via web portal. Tracking Number . READY- NO PA REQ

## 2022-11-26 ENCOUNTER — Telehealth: Payer: Self-pay | Admitting: *Deleted

## 2022-11-26 NOTE — Telephone Encounter (Signed)
I tried to call the pt but call would not go through.

## 2022-11-26 NOTE — Telephone Encounter (Signed)
----- Message from Tarri Fuller, CMA sent at 11/20/2022  4:15 PM EDT ----- Regarding: SEE NOTES; Fraser Din, CMA Certified Medical Assistant   Telephone Encounter Sign at exiting of workspace   Creation Time: 11/20/2022  4:10 PM  Sign at exiting of workspace   I s/w the pt today about [name] [name] that he has not come by the office to pick up the device, see previous notes. Pt said he has been sick this month and asked if I could call him back next week. I said that will be fine.   Addend   Delete   Tag   Copy   Tarri Fuller, CMA Certified Medical Assistant   Telephone Encounter Signed   Creation Time: 11/20/2022  4:10 PM  Signed   ----- Message from Tarri Fuller, CMA sent at 11/12/2022  8:21 AM EDT ----- Regarding: [name] NEEDS TO BE PICKED UP, SEE NOTES 11/12/22  8:20 AM Note FYI to DR. Excell Seltzer and Dema Severin RN      11/12/22  8:19 AM Note I checked the front desk this morning to see if the pt has come by the office to pick up new sleep [name] device, see previous notes. I left a message for the pt today that he needs to call me today to let me know when he is coming by to pick up the device so he can get the [name] done for the cardiologist. I will update the ordering provider as well.      11/12/22  8:15 AM You attempted to contact [name] [name] (Left Message)    November 05, 2022       11/05/22  9:23 AM Note Left message for the pt to call me back today and let me know when he is going to come by the office to pick up the new sleep [name] device and return the old one. I checked at the front desk today and saw the new device is still there waiting to be picked up. I asked if pt would please be able to come by this week and pick up new device as we need to get this done for the doctor, as well as the authorization is only good for a certain amount of time.      11/05/22  9:20 AM You attempted to contact [name] [name] (Left Message)    October 27, 2022      10/27/22  2:22 PM Note I s/w the pt who was supposed to come by the office today to pick up replacement [name] device and return the old device, see previous notes. Pt said he cannot make it today, but will stop by the office tomorrow to pick up the device. I will leave replacement device at the front desk for pick up. I told pt that he will need to let the front desk know that the old device needs to be returned to me. Pt has the app on his phone for sleep [name]. Pt thanked me for the help. Pt will need to do sleep [name] by early next week.       10/27/22  2:19 PM You contacted [name] [name]    October 23, 2022     10/23/22 10:38 AM Note I called [name] [name] [phone] to confirm if the pt can still use the device from last year or does he need a new device. Per [employer] pt will need a new device. I called the  pt back and s/w him and his wife and they are going to come by the office 10/27/22 11:30 to see me to pick up the new device. I will need to register the new device. Pt and his wife are agreeable to plan of care.      10/23/22 10:34 AM You contacted [name] company        10/23/22 10:17 AM Note I s/w the pt and his wife in regard to [name] [name]. Pt was given device last year and never did the [name]. Pt was here 10/19/22 for appt with Dr. Excell Seltzer. Pt tells me that he told MD he still had the device from last year. I did state to the pt the importance of following through with the doctors orders. I tried to help the pt and his wife download the [facility] app on his phone. Pt's wife took the information down what to do and will try to get it on his phone. I also gave them the # [phone] if they have any questions or problems. Pt asked me if the device from last year will still  work. I honestly stated not sure, but I do not see why not. Pt will try to do sleep [name] this weekend. Pt has been given PIN# [id number].      10/23/22 10:09 AM You contacted [name] [name]    October 22, 2022   [name], CMA  to Me      10/22/22  6:21 PM READY- NO PA REQ   [name], CMA     10/22/22  6:21 PM Note Prior Authorization for [facility] sent to [facility] via web portal. Tracking Number . READY- NO PA REQ

## 2022-12-08 NOTE — Patient Instructions (Signed)
____________________________________________________________________________________________  Opioid Pain Medication Update  To: All patients taking opioid pain medications. (I.e.: hydrocodone, hydromorphone, oxycodone, oxymorphone, morphine, codeine, methadone, tapentadol, tramadol, buprenorphine, fentanyl, etc.)  Re: Updated review of side effects and adverse reactions of opioid analgesics, as well as new information about long term effects of this class of medications.  Direct risks of long-term opioid therapy are not limited to opioid addiction and overdose. Potential medical risks include serious fractures, breathing problems during sleep, hyperalgesia, immunosuppression, chronic constipation, bowel obstruction, myocardial infarction, and tooth decay secondary to xerostomia.  Unpredictable adverse effects that can occur even if you take your medication correctly: Cognitive impairment, respiratory depression, and death. Most people think that if they take their medication "correctly", and "as instructed", that they will be safe. Nothing could be farther from the truth. In reality, a significant amount of recorded deaths associated with the use of opioids has occurred in individuals that had taken the medication for a long time, and were taking their medication correctly. The following are examples of how this can happen: Patient taking his/her medication for a long time, as instructed, without any side effects, is given a certain antibiotic or another unrelated medication, which in turn triggers a "Drug-to-drug interaction" leading to disorientation, cognitive impairment, impaired reflexes, respiratory depression or an untoward event leading to serious bodily harm or injury, including death.  Patient taking his/her medication for a long time, as instructed, without any side effects, develops an acute impairment of liver and/or kidney function. This will lead to a rapid inability of the body to  breakdown and eliminate their pain medication, which will result in effects similar to an "overdose", but with the same medicine and dose that they had always taken. This again may lead to disorientation, cognitive impairment, impaired reflexes, respiratory depression or an untoward event leading to serious bodily harm or injury, including death.  A similar problem will occur with patients as they grow older and their liver and kidney function begins to decrease as part of the aging process.  Background information: Historically, the original case for using long-term opioid therapy to treat chronic noncancer pain was based on safety assumptions that subsequent experience has called into question. In 1996, the American Pain Society and the American Academy of Pain Medicine issued a consensus statement supporting long-term opioid therapy. This statement acknowledged the dangers of opioid prescribing but concluded that the risk for addiction was low; respiratory depression induced by opioids was short-lived, occurred mainly in opioid-naive patients, and was antagonized by pain; tolerance was not a common problem; and efforts to control diversion should not constrain opioid prescribing. This has now proven to be wrong. Experience regarding the risks for opioid addiction, misuse, and overdose in community practice has failed to support these assumptions.  According to the Centers for Disease Control and Prevention, fatal overdoses involving opioid analgesics have increased sharply over the past decade. Currently, more than 96,700 people die from drug overdoses every year. Opioids are a factor in 7 out of every 10 overdose deaths. Deaths from drug overdose have surpassed motor vehicle accidents as the leading cause of death for individuals between the ages of 35 and 54.  Clinical data suggest that neuroendocrine dysfunction may be very common in both men and women, potentially causing hypogonadism, erectile  dysfunction, infertility, decreased libido, osteoporosis, and depression. Recent studies linked higher opioid dose to increased opioid-related mortality. Controlled observational studies reported that long-term opioid therapy may be associated with increased risk for cardiovascular events. Subsequent meta-analysis concluded   that the safety of long-term opioid therapy in elderly patients has not been proven.   Side Effects and adverse reactions: Common side effects: Drowsiness (sedation). Dizziness. Nausea and vomiting. Constipation. Physical dependence -- Dependence often manifests with withdrawal symptoms when opioids are discontinued or decreased. Tolerance -- As you take repeated doses of opioids, you require increased medication to experience the same effect of pain relief. Respiratory depression -- This can occur in healthy people, especially with higher doses. However, people with COPD, asthma or other lung conditions may be even more susceptible to fatal respiratory impairment.  Uncommon side effects: An increased sensitivity to feeling pain and extreme response to pain (hyperalgesia). Chronic use of opioids can lead to this. Delayed gastric emptying (the process by which the contents of your stomach are moved into your small intestine). Muscle rigidity. Immune system and hormonal dysfunction. Quick, involuntary muscle jerks (myoclonus). Arrhythmia. Itchy skin (pruritus). Dry mouth (xerostomia).  Long-term side effects: Chronic constipation. Sleep-disordered breathing (SDB). Increased risk of bone fractures. Hypothalamic-pituitary-adrenal dysregulation. Increased risk of overdose.  RISKS: Fractures and Falls:  Opioids increase the risk and incidence of falls. This is of particular importance in elderly patients.  Endocrine System:  Long-term administration is associated with endocrine abnormalities (endocrinopathies). (Also known as Opioid-induced Endocrinopathy) Influences  on both the hypothalamic-pituitary-adrenal axis?and the hypothalamic-pituitary-gonadal axis have been demonstrated with consequent hypogonadism and adrenal insufficiency in both sexes. Hypogonadism and decreased levels of dehydroepiandrosterone sulfate have been reported in men and women. Endocrine effects include: Amenorrhoea in women (abnormal absence of menstruation) Reduced libido in both sexes Decreased sexual function Erectile dysfunction in men Hypogonadisms (decreased testicular function with shrinkage of testicles) Infertility Depression and fatigue Loss of muscle mass Anxiety Depression Immune suppression Hyperalgesia Weight gain Anemia Osteoporosis Patients (particularly women of childbearing age) should avoid opioids. There is insufficient evidence to recommend routine monitoring of asymptomatic patients taking opioids in the long-term for hormonal deficiencies.  Immune System: Human studies have demonstrated that opioids have an immunomodulating effect. These effects are mediated via opioid receptors both on immune effector cells and in the central nervous system. Opioids have been demonstrated to have adverse effects on antimicrobial response and anti-tumour surveillance. Buprenorphine has been demonstrated to have no impact on immune function.  Opioid Induced Hyperalgesia: Human studies have demonstrated that prolonged use of opioids can lead to a state of abnormal pain sensitivity, sometimes called opioid induced hyperalgesia (OIH). Opioid induced hyperalgesia is not usually seen in the absence of tolerance to opioid analgesia. Clinically, hyperalgesia may be diagnosed if the patient on long-term opioid therapy presents with increased pain. This might be qualitatively and anatomically distinct from pain related to disease progression or to breakthrough pain resulting from development of opioid tolerance. Pain associated with hyperalgesia tends to be more diffuse than the  pre-existing pain and less defined in quality. Management of opioid induced hyperalgesia requires opioid dose reduction.  Cancer: Chronic opioid therapy has been associated with an increased risk of cancer among noncancer patients with chronic pain. This association was more evident in chronic strong opioid users. Chronic opioid consumption causes significant pathological changes in the small intestine and colon. Epidemiological studies have found that there is a link between opium dependence and initiation of gastrointestinal cancers. Cancer is the second leading cause of death after cardiovascular disease. Chronic use of opioids can cause multiple conditions such as GERD, immunosuppression and renal damage as well as carcinogenic effects, which are associated with the incidence of cancers.   Mortality: Long-term opioid use   has been associated with increased mortality among patients with chronic non-cancer pain (CNCP).  Prescription of long-acting opioids for chronic noncancer pain was associated with a significantly increased risk of all-cause mortality, including deaths from causes other than overdose.  Reference: Von Korff M, Kolodny A, Deyo RA, Chou R. Long-term opioid therapy reconsidered. Ann Intern Med. 2011 Sep 6;155(5):325-8. doi: 10.7326/[id number]. PMID: 21893626; PMCID: PMC3280085. Bedson J, Chen Y, Ashworth J, Hayward RA, Dunn KM, Jordan KP. Risk of adverse events in patients prescribed long-term opioids: A cohort study in the [place] Clinical Practice Research Datalink. Eur J Pain. 2019 May;23(5):908-922. doi: 10.1002/ejp.1357. Epub 2019 Jan 31. PMID: 30620116. Colameco S, Coren JS, Ciervo CA. Continuous opioid treatment for chronic noncancer pain: a time for moderation in prescribing. Postgrad Med. 2009 Jul;121(4):61-6. doi: 10.3810/pgm.2009.07.2032. PMID: 19641271. Chou R, [name] JA, Devine EB, Hansen RN, Sullivan SD, Blazina I, Dana T, Bougatsos C, Deyo RA. The  effectiveness and risks of long-term opioid therapy for chronic pain: a systematic review for a National Institutes of Health Pathways to Prevention Workshop. Ann Intern Med. 2015 Feb 17;162(4):276-86. doi: 10.7326/M14-2559. PMID: 25581257. Warner M, Chen LH, Makuc DM. NCHS Data Brief No. 22. [place]: Centers for Disease Control and Prevention; 2009. Sep, Increase in Fatal Poisonings Involving Opioid Analgesics in the [place], 1999-2006. Song IA, Choi HR, Oh TK. Long-term opioid use and mortality in patients with chronic non-cancer pain: Ten-year follow-up study in [place] from 2010 through 2019. EClinicalMedicine. 2022 Jul 18;51:101558. doi: 10.1016/j.eclinm.2022.101558. PMID: 35875817; PMCID: PMC9304910. Huser, W., Schubert, T., Vogelmann, T. et al. All-cause mortality in patients with long-term opioid therapy compared with non-opioid analgesics for chronic non-cancer pain: a database study. BMC Med 18, 162 (2020). https://doi.org/10.1186/[email or web address] Rashidian H, Zendehdel K, Kamangar F, Malekzadeh R, Haghdoost AA. An Ecological Study of the Association between Opiate Use and Incidence of Cancers. Addict Health. 2016 Fall;8(4):252-260. PMID: 28819556; PMCID: PMC5554805.  Our Goal: Our goal is to control your pain with means other than the use of opioid pain medications.  Our Recommendation: Talk to your physician about coming off of these medications. We can assist you with the tapering down and stopping these medicines. Based on the new information, even if you cannot completely stop the medication, a decrease in the dose may be associated with a lesser risk. Ask for other means of controlling the pain. Decrease or eliminate those factors that significantly contribute to your pain such as smoking, obesity, and a diet heavily tilted towards "inflammatory" nutrients.  Last Updated: 09/30/2022    ____________________________________________________________________________________________     ____________________________________________________________________________________________  Patient Information update  To: All of our patients.  Re: Name change.  It has been made official that our current name, "[facility]"   will soon be changed to "[facility]".   The purpose of this change is to eliminate any confusion created by the concept of our practice being a "Medication Management Pain Clinic". In the past this has led to the misconception that we treat pain primarily by the use of prescription medications.  Nothing can be farther from the truth.   Understanding PAIN MANAGEMENT: To further understand what our practice does, you first have to understand that "Pain Management" is a subspecialty that requires additional training once a physician has completed their specialty training, which can be in either Anesthesia, Neurology, Psychiatry, or Physical Medicine and Rehabilitation (PMR). Each one of these contributes to the final approach taken by each physician to   the management of their patient's pain. To be a "Pain Management Specialist" you must have first completed one of the specialty trainings below.  Anesthesiologists - trained in clinical pharmacology and interventional techniques such as nerve blockade and regional as well as central neuroanatomy. They are trained to block pain before, during, and after surgical interventions.  Neurologists - trained in the diagnosis and pharmacological treatment of complex neurological conditions, such as Multiple Sclerosis, Parkinson's, spinal cord injuries, and other systemic conditions that may be associated with symptoms that may include but are not limited to pain. They tend to rely primarily on the treatment of chronic pain  using prescription medications.  Psychiatrist - trained in conditions affecting the psychosocial wellbeing of patients including but not limited to depression, anxiety, schizophrenia, personality disorders, addiction, and other substance use disorders that may be associated with chronic pain. They tend to rely primarily on the treatment of chronic pain using prescription medications.   Physical Medicine and Rehabilitation (PMR) physicians, also known as physiatrists - trained to treat a wide variety of medical conditions affecting the brain, spinal cord, nerves, bones, joints, ligaments, muscles, and tendons. Their training is primarily aimed at treating patients that have suffered injuries that have caused severe physical impairment. Their training is primarily aimed at the physical therapy and rehabilitation of those patients. They may also work alongside orthopedic surgeons or neurosurgeons using their expertise in assisting surgical patients to recover after their surgeries.  INTERVENTIONAL PAIN MANAGEMENT is sub-subspecialty of Pain Management.  Our physicians are Board-certified in Anesthesia, Pain Management, and Interventional Pain Management.  This meaning that not only have they been trained and Board-certified in their specialty of Anesthesia, and subspecialty of Pain Management, but they have also received further training in the sub-subspecialty of Interventional Pain Management, in order to become Board-certified as INTERVENTIONAL PAIN MANAGEMENT SPECIALIST.    Mission: Our goal is to use our skills in  INTERVENTIONAL PAIN MANAGEMENT as alternatives to the chronic use of prescription opioid medications for the treatment of pain. To make this more clear, we have changed our name to reflect what we do and offer. We will continue to offer medication management assessment and recommendations, but we will not be taking over any patient's medication  management.  ____________________________________________________________________________________________     ____________________________________________________________________________________________  National Pain Medication Shortage  The [place] is experiencing worsening drug shortages. These have had a negative widespread effect on patient care and treatment. Not expected to improve any time soon. Predicted to last past 2029.   Drug shortage list (generic names) Oxycodone IR Oxycodone/APAP Oxymorphone IR Hydromorphone Hydrocodone/APAP Morphine  Where is the problem?  Manufacturing and supply level.  Will this shortage affect you?  Only if you take any of the above pain medications.  How? You may be unable to fill your prescription.  Your pharmacist may offer a "partial fill" of your prescription. (Warning: Do not accept partial fills.) Prescriptions partially filled cannot be transferred to another pharmacy. Read our Medication Rules and Regulation. Depending on how much medicine you are dependent on, you may experience withdrawals when unable to get the medication.  Recommendations: Consider ending your dependence on opioid pain medications. Ask your pain specialist to assist you with the process. Consider switching to a medication currently not in shortage, such as Buprenorphine. Talk to your pain specialist about this option. Consider decreasing your pain medication requirements by managing tolerance thru "Drug Holidays". This may help minimize withdrawals, should you run out of medicine. Control your pain thru   the use of non-pharmacological interventional therapies.   Your prescriber: Prescribers cannot be blamed for shortages. Medication manufacturing and supply issues cannot be fixed by the prescriber.   NOTE: The prescriber is not responsible for supplying the medication, or solving supply issues. Work with your pharmacist to solve it. The patient is responsible for  the decision to take or continue taking the medication and for identifying and securing a legal supply source. By law, supplying the medication is the job and responsibility of the pharmacy. The prescriber is responsible for the evaluation, monitoring, and prescribing of these medications.   Prescribers will NOT: Re-issue prescriptions that have been partially filled. Re-issue prescriptions already sent to a pharmacy.  Re-send prescriptions to a different pharmacy because yours did not have your medication. Ask pharmacist to order more medicine or transfer the prescription to another pharmacy. (Read below.)  New 2023 regulation: "April 03, 2022 Revised Regulation Allows DEA-Registered Pharmacies to Transfer Electronic Prescriptions at a Patient's Request DEA Headquarters Division - Public Information Office Patients now have the ability to request their electronic prescription be transferred to another pharmacy without having to go back to their practitioner to initiate the request. This revised regulation went into effect on Monday, March 30, 2022.     At a patient's request, a DEA-registered retail pharmacy can now transfer an electronic prescription for a controlled substance (schedules II-V) to another DEA-registered retail pharmacy. Prior to this change, patients would have to go through their practitioner to cancel their prescription and have it re-issued to a different pharmacy. The process was taxing and time consuming for both patients and practitioners.    The Drug Enforcement Administration (DEA) published its intent to revise the process for transferring electronic prescriptions on June 21, 2020.  The final rule was published in the federal register on February 26, 2022 and went into effect 30 days later.  Under the final rule, a prescription can only be transferred once between pharmacies, and only if allowed under existing state or other applicable law. The prescription must  remain in its electronic form; may not be altered in any way; and the transfer must be communicated directly between two licensed pharmacists. It's important to note, any authorized refills transfer with the original prescription, which means the entire prescription will be filled at the same pharmacy".  Reference: https://www.dea.gov/stories/2023/2023-04/2022-09-01/revised-regulation-allows-dea-registered-pharmacies-transfer (DEA website announcement)  https://www.govinfo.gov/content/pkg/FR-2022-02-26/pdf/2023-15847.pdf (Federal Register  Department of Justice)   Federal Register / Vol. 88, No. 143 / Thursday, February 26, 2022 / Rules and Regulations DEPARTMENT OF JUSTICE  Drug Enforcement Administration  21 CFR Part 1306  [Docket No. [id number]]  RIN [id number]-AB64 Transfer of Electronic Prescriptions for Schedules II-V Controlled Substances Between Pharmacies for Initial Filling  ____________________________________________________________________________________________     ____________________________________________________________________________________________  Transfer of Pain Medication between Pharmacies  Re: 2023 DEA Clarification on existing regulation  Published on DEA Website: April 03, 2022  Title: Revised Regulation Allows DEA-Registered Pharmacies to Transfer Electronic Prescriptions at a Patient's Request DEA Headquarters Division - Public Information Office  "Patients now have the ability to request their electronic prescription be transferred to another pharmacy without having to go back to their practitioner to initiate the request. This revised regulation went into effect on Monday, March 30, 2022.     At a patient's request, a DEA-registered retail pharmacy can now transfer an electronic prescription for a controlled substance (schedules II-V) to another DEA-registered retail pharmacy. Prior to this change, patients would have to go through their practitioner to  cancel their prescription   and have it re-issued to a different pharmacy. The process was taxing and time consuming for both patients and practitioners.    The Drug Enforcement Administration (DEA) published its intent to revise the process for transferring electronic prescriptions on June 21, 2020.  The final rule was published in the federal register on February 26, 2022 and went into effect 30 days later.  Under the final rule, a prescription can only be transferred once between pharmacies, and only if allowed under existing state or other applicable law. The prescription must remain in its electronic form; may not be altered in any way; and the transfer must be communicated directly between two licensed pharmacists. It's important to note, any authorized refills transfer with the original prescription, which means the entire prescription will be filled at the same pharmacy."    REFERENCES: 1. DEA website announcement https://www.dea.gov/stories/2023/2023-04/2022-09-01/revised-regulation-allows-dea-registered-pharmacies-transfer  2. Department of Justice website  https://www.govinfo.gov/content/pkg/FR-2022-02-26/pdf/2023-15847.pdf  3. DEPARTMENT OF JUSTICE Drug Enforcement Administration 21 CFR Part 1306 [Docket No. [id number]] RIN [id number] "Transfer of Electronic Prescriptions for Schedules II-V Controlled Substances Between Pharmacies for Initial Filling"  ____________________________________________________________________________________________     _______________________________________________________________________  Medication Rules  Purpose: To inform patients, and their family members, of our medication rules and regulations.  Applies to: All patients receiving prescriptions from our practice (written or electronic).  Pharmacy of record: This is the pharmacy where your electronic prescriptions will be sent. Make sure we have the correct one.  Electronic prescriptions: In  compliance with the [place] Strengthen Opioid Misuse Prevention (STOP) Act of 2017 (Session Law 2017-74/H243), effective August 03, 2018, all controlled substances must be electronically prescribed. Written prescriptions, faxing, or calling prescriptions to a pharmacy will no longer be done.  Prescription refills: These will be provided only during in-person appointments. No medications will be renewed without a "face-to-face" evaluation with your provider. Applies to all prescriptions.  NOTE: The following applies primarily to controlled substances (Opioid* Pain Medications).   Type of encounter (visit): For patients receiving controlled substances, face-to-face visits are required. (Not an option and not up to the patient.)  Patient's responsibilities: Pain Pills: Bring all pain pills to every appointment (except for procedure appointments). Pill Bottles: Bring pills in original pharmacy bottle. Bring bottle, even if empty. Always bring the bottle of the most recent fill.  Medication refills: You are responsible for knowing and keeping track of what medications you are taking and when is it that you will need a refill. The day before your appointment: write a list of all prescriptions that need to be refilled. The day of the appointment: give the list to the admitting nurse. Prescriptions will be written only during appointments. No prescriptions will be written on procedure days. If you forget a medication: it will not be "Called in", "Faxed", or "electronically sent". You will need to get another appointment to get these prescribed. No early refills. Do not call asking to have your prescription filled early. Partial  or short prescriptions: Occasionally your pharmacy may not have enough pills to fill your prescription.  NEVER ACCEPT a partial fill or a prescription that is short of the total amount of pills that you were prescribed.  With controlled substances the law allows 72 hours for  the pharmacy to complete the prescription.  If the prescription is not completed within 72 hours, the pharmacist will require a new prescription to be written. This means that you will be short on your medicine and we WILL NOT send another prescription to complete your original   prescription.  Instead, request the pharmacy to send a carrier to a nearby branch to get enough medication to provide you with your full prescription. Prescription Accuracy: You are responsible for carefully inspecting your prescriptions before leaving our office. Have the discharge nurse carefully go over each prescription with you, before taking them home. Make sure that your name is accurately spelled, that your address is correct. Check the name and dose of your medication to make sure it is accurate. Check the number of pills, and the written instructions to make sure they are clear and accurate. Make sure that you are given enough medication to last until your next medication refill appointment. Taking Medication: Take medication as prescribed. When it comes to controlled substances, taking less pills or less frequently than prescribed is permitted and encouraged. Never take more pills than instructed. Never take the medication more frequently than prescribed.  Inform other Doctors: Always inform, all of your healthcare providers, of all the medications you take. Pain Medication from other Providers: You are not allowed to accept any additional pain medication from any other Doctor or Healthcare provider. There are two exceptions to this rule. (see below) In the event that you require additional pain medication, you are responsible for notifying us, as stated below. Cough Medicine: Often these contain an opioid, such as codeine or hydrocodone. Never accept or take cough medicine containing these opioids if you are already taking an opioid* medication. The combination may cause respiratory failure and death. Medication Agreement:  You are responsible for carefully reading and following our Medication Agreement. This must be signed before receiving any prescriptions from our practice. Safely store a copy of your signed Agreement. Violations to the Agreement will result in no further prescriptions. (Additional copies of our Medication Agreement are available upon request.) Laws, Rules, & Regulations: All patients are expected to follow all Federal and State Laws, Statutes, Rules, & Regulations. Ignorance of the Laws does not constitute a valid excuse.  Illegal drugs and Controlled Substances: The use of illegal substances (including, but not limited to marijuana and its derivatives) and/or the illegal use of any controlled substances is strictly prohibited. Violation of this rule may result in the immediate and permanent discontinuation of any and all prescriptions being written by our practice. The use of any illegal substances is prohibited. Adopted CDC guidelines & recommendations: Target dosing levels will be at or below 60 MME/day. Use of benzodiazepines** is not recommended.  Exceptions: There are only two exceptions to the rule of not receiving pain medications from other Healthcare Providers. Exception #1 (Emergencies): In the event of an emergency (i.e.: accident requiring emergency care), you are allowed to receive additional pain medication. However, you are responsible for: As soon as you are able, call our office [phone], at any time of the day or night, and leave a message stating your name, the date and nature of the emergency, and the name and dose of the medication prescribed. In the event that your call is answered by a member of our staff, make sure to document and save the date, time, and the name of the person that took your information.  Exception #2 (Planned Surgery): In the event that you are scheduled by another doctor or dentist to have any type of surgery or procedure, you are allowed (for a period no  longer than 30 days), to receive additional pain medication, for the acute post-op pain. However, in this case, you are responsible for picking up a copy of   our "Post-op Pain Management for Surgeons" handout, and giving it to your surgeon or dentist. This document is available at our office, and does not require an appointment to obtain it. Simply go to our office during business hours (Monday-Thursday from 8:00 AM to 4:00 PM) (Friday 8:00 AM to 12:00 Noon) or if you have a scheduled appointment with us, prior to your surgery, and ask for it by name. In addition, you are responsible for: calling our office [phone], at any time of the day or night, and leaving a message stating your name, name of your surgeon, type of surgery, and date of procedure or surgery. Failure to comply with your responsibilities may result in termination of therapy involving the controlled substances. Medication Agreement Violation. Following the above rules, including your responsibilities will help you in avoiding a Medication Agreement Violation ("Breaking your Pain Medication Contract").  Consequences:  Not following the above rules may result in permanent discontinuation of medication prescription therapy.  *Opioid medications include: morphine, codeine, oxycodone, oxymorphone, hydrocodone, hydromorphone, meperidine, tramadol, tapentadol, buprenorphine, fentanyl, methadone. **Benzodiazepine medications include: diazepam (Valium), alprazolam (Xanax), clonazepam (Klonopine), lorazepam (Ativan), clorazepate (Tranxene), chlordiazepoxide (Librium), estazolam (Prosom), oxazepam (Serax), temazepam (Restoril), triazolam (Halcion) (Last updated: 05/26/2022) ______________________________________________________________________    ______________________________________________________________________  Medication Recommendations and Reminders  Applies to: All patients receiving prescriptions (written and/or  electronic).  Medication Rules & Regulations: You are responsible for reading, knowing, and following our "Medication Rules" document. These exist for your safety and that of others. They are not flexible and neither are we. Dismissing or ignoring them is an act of "non-compliance" that may result in complete and irreversible termination of such medication therapy. For safety reasons, "non-compliance" will not be tolerated. As with the [place] fundamental legal principle of "ignorance of the law is no defense", we will accept no excuses for not having read and knowing the content of documents provided to you by our practice.  Pharmacy of record:  Definition: This is the pharmacy where your electronic prescriptions will be sent.  We do not endorse any particular pharmacy. It is up to you and your insurance to decide what pharmacy to use.  We do not restrict you in your choice of pharmacy. However, once we write for your prescriptions, we will NOT be re-sending more prescriptions to fix restricted supply problems created by your pharmacy, or your insurance.  The pharmacy listed in the electronic medical record should be the one where you want electronic prescriptions to be sent. If you choose to change pharmacy, simply notify our nursing staff. Changes will be made only during your regular appointments and not over the phone.  Recommendations: Keep all of your pain medications in a safe place, under lock and key, even if you live alone. We will NOT replace lost, stolen, or damaged medication. We do not accept "Police Reports" as proof of medications having been stolen. After you fill your prescription, take 1 week's worth of pills and put them away in a safe place. You should keep a separate, properly labeled bottle for this purpose. The remainder should be kept in the original bottle. Use this as your primary supply, until it runs out. Once it's gone, then you know that you have 1 week's worth of medicine,  and it is time to come in for a prescription refill. If you do this correctly, it is unlikely that you will ever run out of medicine. To make sure that the above recommendation works, it is very important that you make   sure your medication refill appointments are scheduled at least 1 week before you run out of medicine. To do this in an effective manner, make sure that you do not leave the office without scheduling your next medication management appointment. Always ask the nursing staff to show you in your prescription , when your medication will be running out. Then arrange for the receptionist to get you a return appointment, at least 7 days before you run out of medicine. Do not wait until you have 1 or 2 pills left, to come in. This is very poor planning and does not take into consideration that we may need to cancel appointments due to bad weather, sickness, or emergencies affecting our staff. DO NOT ACCEPT A "Partial Fill": If for any reason your pharmacy does not have enough pills/tablets to completely fill or refill your prescription, do not allow for a "partial fill". The law allows the pharmacy to complete that prescription within 72 hours, without requiring a new prescription. If they do not fill the rest of your prescription within those 72 hours, you will need a separate prescription to fill the remaining amount, which we will NOT provide. If the reason for the partial fill is your insurance, you will need to talk to the pharmacist about payment alternatives for the remaining tablets, but again, DO NOT ACCEPT A PARTIAL FILL, unless you can trust your pharmacist to obtain the remainder of the pills within 72 hours.  Prescription refills and/or changes in medication(s):  Prescription refills, and/or changes in dose or medication, will be conducted only during scheduled medication management appointments. (Applies to both, written and electronic prescriptions.) No refills on procedure days. No  medication will be changed or started on procedure days. No changes, adjustments, and/or refills will be conducted on a procedure day. Doing so will interfere with the diagnostic portion of the procedure. No phone refills. No medications will be "called into the pharmacy". No Fax refills. No weekend refills. No Holliday refills. No after hours refills.  Remember:  Business hours are:  Monday to Thursday 8:00 AM to 4:00 PM Provider's Schedule: Akirah Storck, MD - Appointments are:  Medication management: Monday and Wednesday 8:00 AM to 4:00 PM Procedure day: Tuesday and Thursday 7:30 AM to 4:00 PM Bilal Lateef, MD - Appointments are:  Medication management: Tuesday and Thursday 8:00 AM to 4:00 PM Procedure day: Monday and Wednesday 7:30 AM to 4:00 PM (Last update: 05/26/2022) ______________________________________________________________________    ____________________________________________________________________________________________  Drug Holidays  What is a "Drug Holiday"? Drug Holiday: is the name given to the process of slowly tapering down and temporarily stopping the pain medication for the purpose of decreasing or eliminating tolerance to the drug.  Benefits Improved effectiveness Decreased required effective dose Improved pain control End dependence on high dose therapy Decrease cost of therapy Uncovering "opioid-induced hyperalgesia". (OIH)  What is "opioid hyperalgesia"? It is a paradoxical increase in pain caused by exposure to opioids. Stopping the opioid pain medication, contrary to the expected, it actually decreases or completely eliminates the pain. Ref.: "A comprehensive review of opioid-induced hyperalgesia". Marion Lee, et.al. Pain Physician. 2011 Mar-Apr;14(2):145-61.  What is tolerance? Tolerance: the progressive loss of effectiveness of a pain medicine due to repetitive use. A common problem of opioid pain medications.  How long should a "Drug  Holiday" last? Effectiveness depends on the patient staying off all opioid pain medicines for a minimum of 14 consecutive days. (2 weeks)  How about just taking less of the medicine? Does not   work. Will not accomplish goal of eliminating the excess receptors.  How about switching to a different pain medicine? (AKA. "Opioid rotation") Does not work. Creates the illusion of effectiveness by taking advantage of inaccurate equivalent dose calculations between different opioids. -This "technique" was promoted by studies funded by pharmaceutical companies, such as [employer], creators of "OxyContin".  Can I stop the medicine "cold turkey"? We do not recommend it. You should always coordinate with your prescribing physician to make the transition as smoothly as possible. Avoid stopping the medicine abruptly without consulting. We recommend a "slow taper".  What is a slow taper? Taper: refers to the gradual decrease in dose.   How do I stop/taper the dose? Slowly. Decrease the daily amount of pills that you take by one (1) pill every seven (7) days. This is called a "slow downward taper". Example: if you normally take four (4) pills per day, drop it to three (3) pills per day for seven (7) days, then to two (2) pills per day for seven (7) days, then to one (1) per day for seven (7) days, and then stop the medicine. The 14 day "Drug Holiday" starts on the first day without medicine.   Will I experience withdrawals? Unlikely with a slow taper.  What triggers withdrawals? Withdrawals are triggered by the sudden/abrupt stop of high dose opioids. Withdrawals can be avoided by slowly decreasing the dose over a prolonged period of time.  What are withdrawals? Symptoms associated with sudden/abrupt reduction/stopping of high-dose, long-term use of pain medication. Withdrawal are seldom seen on low dose therapy, or patients rarely taking opioid medication.  Early Withdrawal Symptoms may  include: Agitation Anxiety Muscle aches Increased tearing Insomnia Runny nose Sweating Yawning  Late symptoms may include: Abdominal cramping Diarrhea Dilated pupils Goose bumps Nausea Vomiting  When could I see withdrawals? Onset: 8-24 hours after last use for most opioids. 12-48 hours for long-acting opioids (i.e.: methadone)  How long could they last? Duration: 4-10 days for most opioids. 14-21 days for long-acting opioids (i.e.: methadone)  What will happen after I complete my "Drug Holiday"? The need and indications for the opioid analgesic will be reviewed before restarting the medication. Dose requirements will likely decrease and the dose will need to be adjusted accordingly.   (Last update: 10/21/2022) ____________________________________________________________________________________________    ____________________________________________________________________________________________  WARNING: CBD (cannabidiol) & Delta (Delta-8 tetrahydrocannabinol) products.   Applicable to:  All individuals currently taking or considering taking CBD (cannabidiol) and, more important, all patients taking opioid analgesic controlled substances (pain medication). (Example: oxycodone; oxymorphone; hydrocodone; hydromorphone; morphine; methadone; tramadol; tapentadol; fentanyl; buprenorphine; butorphanol; dextromethorphan; meperidine; codeine; etc.)  Introduction:  Recently there has been a drive towards the use of "natural" products for the treatment of different conditions, including pain anxiety and sleep disorders. Marijuana and hemp are two varieties of the cannabis genus plants. Marijuana and its derivatives are illegal, while hemp and its derivatives are not. Cannabidiol (CBD) and tetrahydrocannabinol (THC), are two natural compounds found in plants of the Cannabis genus. They can both be extracted from hemp or marijuana. Both compounds interact with your body's endocannabinoid  system in very different ways. CBD is associated with pain relief (analgesia) while THC is associated with the psychoactive effects ("the high") obtained from the use of marijuana products. There are two main types of THC: Delta-9, which comes from the marijuana plant and it is illegal, and Delta-8, which comes from the hemp plant, and it is legal. (Both, Delta-9-THC and Delta-8-THC are psychoactive and   give you "the high".)   Legality:  Marijuana and its derivatives: illegal Hemp and its derivatives: Legal (State dependent) UPDATE: (09/19/2021) The Drug Enforcement Agency (DEA) issued a letter stating that "delta" cannabinoids, including Delta-8-THCO and Delta-9-THCO, synthetically derived from hemp do not qualify as hemp and will be viewed as Schedule I drugs. (Schedule I drugs, substances, or chemicals are defined as drugs with no currently accepted medical use and a high potential for abuse. Some examples of Schedule I drugs are: heroin, lysergic acid diethylamide (LSD), marijuana (cannabis), 3,4-methylenedioxymethamphetamine (ecstasy), methaqualone, and peyote.) (https://www.dea.gov)  Legal status of CBD in [place]:  "Conditionally Legal"  Reference: "FDA Regulation of Cannabis and Cannabis-Derived Products, Including Cannabidiol (CBD)" - https://www.fda.gov/news-events/public-health-focus/fda-regulation-cannabis-and-cannabis-derived-products-including-cannabidiol-cbd  Warning:  CBD is not FDA approved and has not undergo the same manufacturing controls as prescription drugs.  This means that the purity and safety of available CBD may be questionable. Most of the time, despite manufacturer's claims, it is contaminated with THC (delta-9-tetrahydrocannabinol - the chemical in marijuana responsible for the "HIGH").  When this is the case, the THC contaminant will trigger a positive urine drug screen (UDS) test for Marijuana (carboxy-THC).   The FDA recently put out a warning about 5 things that everyone  should be aware of regarding Delta-8 THC: Delta-8 THC products have not been evaluated or approved by the FDA for safe use and may be marketed in ways that put the public health at risk. The FDA has received adverse event reports involving delta-8 THC-containing products. Delta-8 THC has psychoactive and intoxicating effects. Delta-8 THC manufacturing often involve use of potentially harmful chemicals to create the concentrations of delta-8 THC claimed in the marketplace. The final delta-8 THC product may have potentially harmful by-products (contaminants) due to the chemicals used in the process. Manufacturing of delta-8 THC products may occur in uncontrolled or unsanitary settings, which may lead to the presence of unsafe contaminants or other potentially harmful substances. Delta-8 THC products should be kept out of the reach of children and pets.  NOTE: Because a positive UDS for any illicit substance is a violation of our medication agreement, your opioid analgesics (pain medicine) may be permanently discontinued.  MORE ABOUT CBD  General Information: CBD was discovered in 1940 and it is a derivative of the cannabis sativa genus plants (Marijuana and Hemp). It is one of the 113 identified substances found in Marijuana. It accounts for up to 40% of the plant's extract. As of 2018, preliminary clinical studies on CBD included research for the treatment of anxiety, movement disorders, and pain. CBD is available and consumed in multiple forms, including inhalation of smoke or vapor, as an aerosol spray, and by mouth. It may be supplied as an oil containing CBD, capsules, dried cannabis, or as a liquid solution. CBD is thought not to be as psychoactive as THC (delta-9-tetrahydrocannabinol - the chemical in marijuana responsible for the "HIGH"). Studies suggest that CBD may interact with different biological target receptors in the body, including cannabinoid and other neurotransmitter receptors. As of  2018 the mechanism of action for its biological effects has not been determined.  Side-effects  Adverse reactions: Dry mouth, diarrhea, decreased appetite, fatigue, drowsiness, malaise, weakness, sleep disturbances, and others.  Drug interactions:  CBD may interact with medications such as blood-thinners. CBD causes drowsiness on its own and it will increase drowsiness caused by other medications, including antihistamines (such as Benadryl), benzodiazepines (Xanax, Ativan, Valium), antipsychotics, antidepressants, opioids, alcohol and supplements such as kava, melatonin and St. John's Wort.    Other drug interactions: Brivaracetam (Briviact); Caffeine; Carbamazepine (Tegretol); Citalopram (Celexa); Clobazam (Onfi); Eslicarbazepine (Aptiom); Everolimus (Zostress); Lithium; Methadone (Dolophine); Rufinamide (Banzel); Sedative medications (CNS depressants); Sirolimus (Rapamune); Stiripentol (Diacomit); Tacrolimus (Prograf); Tamoxifen ; Soltamox); Topiramate (Topamax); Valproate; Warfarin (Coumadin); Zonisamide. (Last update: 07/13/2022) ____________________________________________________________________________________________   ____________________________________________________________________________________________  Naloxone Nasal Spray  Why am I receiving this medication? [place] STOP ACT requires that all patients taking high dose opioids or at risk of opioids respiratory depression, be prescribed an opioid reversal agent, such as Naloxone (AKA: Narcan).  What is this medication? NALOXONE (nal OX one) treats opioid overdose, which causes slow or shallow breathing, severe drowsiness, or trouble staying awake. Call emergency services after using this medication. You may need additional treatment. Naloxone works by reversing the effects of opioids. It belongs to a group of medications called opioid blockers.  COMMON BRAND NAME(S): Kloxxado, Narcan  What should I tell my care team before  I take this medication? They need to know if you have any of these conditions: Heart disease Substance use disorder An unusual or allergic reaction to naloxone, other medications, foods, dyes, or preservatives Pregnant or trying to get pregnant Breast-feeding  When to use this medication? This medication is to be used for the treatment of respiratory depression (less than 8 breaths per minute) secondary to opioid overdose.   How to use this medication? This medication is for use in the nose. Lay the person on their back. Support their neck with your hand and allow the head to tilt back before giving the medication. The nasal spray should be given into 1 nostril. After giving the medication, move the person onto their side. Do not remove or test the nasal spray until ready to use. Get emergency medical help right away after giving the first dose of this medication, even if the person wakes up. You should be familiar with how to recognize the signs and symptoms of a narcotic overdose. If more doses are needed, give the additional dose in the other nostril. Talk to your care team about the use of this medication in children. While this medication may be prescribed for children as young as newborns for selected conditions, precautions do apply.  Naloxone Overdosage: If you think you have taken too much of this medicine contact a poison control center or emergency room at once.  NOTE: This medicine is only for you. Do not share this medicine with others.  What if I miss a dose? This does not apply.  What may interact with this medication? This is only used during an emergency. No interactions are expected during emergency use. This list may not describe all possible interactions. Give your health care provider a list of all the medicines, herbs, non-prescription drugs, or dietary supplements you use. Also tell them if you smoke, drink alcohol, or use illegal drugs. Some items may interact with  your medicine.  What should I watch for while using this medication? Keep this medication ready for use in the case of an opioid overdose. Make sure that you have the phone number of your care team and local hospital ready. You may need to have additional doses of this medication. Each nasal spray contains a single dose. Some emergencies may require additional doses. After use, bring the treated person to the nearest hospital or call 911. Make sure the treating care team knows that the person has received a dose of this medication. You will receive additional instructions on what to do during and after use of this   medication before an emergency occurs.  What side effects may I notice from receiving this medication? Side effects that you should report to your care team as soon as possible: Allergic reactions--skin rash, itching, hives, swelling of the face, lips, tongue, or throat Side effects that usually do not require medical attention (report these to your care team if they continue or are bothersome): Constipation Dryness or irritation inside the nose Headache Increase in blood pressure Muscle spasms Stuffy nose Toothache This list may not describe all possible side effects. Call your doctor for medical advice about side effects. You may report side effects to FDA at 1-800-FDA-1088.  Where should I keep my medication? Because this is an emergency medication, you should keep it with you at all times.  Keep out of the reach of children and pets. Store between 20 and 25 degrees C (68 and 77 degrees F). Do not freeze. Throw away any unused medication after the expiration date. Keep in original box until ready to use.  NOTE: This sheet is a summary. It may not cover all possible information. If you have questions about this medicine, talk to your doctor, pharmacist, or health care provider.   2023 Elsevier/Gold Standard (2021-03-28  00:00:00)  ____________________________________________________________________________________________   
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
## 2022-12-09 ENCOUNTER — Ambulatory Visit: Payer: Medicare Other | Attending: Pain Medicine | Admitting: Pain Medicine

## 2022-12-09 ENCOUNTER — Encounter: Payer: Self-pay | Admitting: Pain Medicine

## 2022-12-09 VITALS — BP 108/81 | HR 78 | Temp 98.1°F | Resp 16 | Ht 66.0 in | Wt 180.0 lb

## 2022-12-09 DIAGNOSIS — M5030 Other cervical disc degeneration, unspecified cervical region: Secondary | ICD-10-CM

## 2022-12-09 DIAGNOSIS — M79601 Pain in right arm: Secondary | ICD-10-CM | POA: Diagnosis present

## 2022-12-09 DIAGNOSIS — Z79899 Other long term (current) drug therapy: Secondary | ICD-10-CM | POA: Diagnosis present

## 2022-12-09 DIAGNOSIS — G894 Chronic pain syndrome: Secondary | ICD-10-CM

## 2022-12-09 DIAGNOSIS — M79604 Pain in right leg: Secondary | ICD-10-CM

## 2022-12-09 DIAGNOSIS — M5137 Other intervertebral disc degeneration, lumbosacral region: Secondary | ICD-10-CM | POA: Diagnosis not present

## 2022-12-09 DIAGNOSIS — M79605 Pain in left leg: Secondary | ICD-10-CM

## 2022-12-09 DIAGNOSIS — Z79891 Long term (current) use of opiate analgesic: Secondary | ICD-10-CM | POA: Diagnosis present

## 2022-12-09 DIAGNOSIS — M542 Cervicalgia: Secondary | ICD-10-CM | POA: Insufficient documentation

## 2022-12-09 DIAGNOSIS — M79602 Pain in left arm: Secondary | ICD-10-CM | POA: Diagnosis present

## 2022-12-09 DIAGNOSIS — M47816 Spondylosis without myelopathy or radiculopathy, lumbar region: Secondary | ICD-10-CM

## 2022-12-09 DIAGNOSIS — M5442 Lumbago with sciatica, left side: Secondary | ICD-10-CM | POA: Diagnosis present

## 2022-12-09 DIAGNOSIS — G8929 Other chronic pain: Secondary | ICD-10-CM | POA: Insufficient documentation

## 2022-12-09 MED ORDER — OXYCODONE HCL 10 MG PO TABS
10.0000 mg | ORAL_TABLET | Freq: Three times a day (TID) | ORAL | 0 refills | Status: DC
Start: 2023-02-07 — End: 2023-03-01

## 2022-12-09 MED ORDER — OXYCODONE HCL 10 MG PO TABS
10.0000 mg | ORAL_TABLET | Freq: Three times a day (TID) | ORAL | 0 refills | Status: DC
Start: 2022-12-09 — End: 2023-03-01

## 2022-12-09 MED ORDER — OXYCODONE HCL 10 MG PO TABS
10.0000 mg | ORAL_TABLET | Freq: Three times a day (TID) | ORAL | 0 refills | Status: DC
Start: 2023-01-08 — End: 2023-03-01

## 2022-12-09 NOTE — Progress Notes (Signed)
Nursing Pain Medication Assessment:  Safety precautions to be maintained throughout the outpatient stay will include: orient to surroundings, keep bed in low position, maintain call bell within reach at all times, provide assistance with transfer out of bed and ambulation.  Medication Inspection Compliance: Pill count conducted under aseptic conditions, in front of the patient. Neither the pills nor the bottle was removed from the patient's sight at any time. Once count was completed pills were immediately returned to the patient in their original bottle.  Medication: Oxycodone IR Pill/Patch Count:  79 of 50 pills remain Pill/Patch Appearance: Markings consistent with prescribed medication Bottle Appearance: Standard pharmacy container. Clearly labeled. Filled Date: 04 / 28 / 2024 Last Medication intake:  Today

## 2022-12-11 LAB — TOXASSURE SELECT 13 (MW), URINE

## 2023-01-08 IMAGING — MR MR CERVICAL SPINE W/O CM
5 series · 38 of 48 positions shown · non-contrast
Comparison: None.

CLINICAL DATA: Neck pain, chronic.

EXAM:
MRI CERVICAL SPINE WITHOUT CONTRAST
TECHNIQUE: Multiplanar, multisequence MR imaging of the cervical spine was
performed. No intravenous contrast was administered.

[Series 5: T2 · sagittal · 3.0mm · 0.62mm/px · 6 of 15 slices shown (1 of 2)]
[im 1/15]
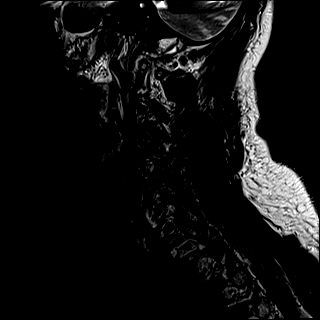
[im 3/15]
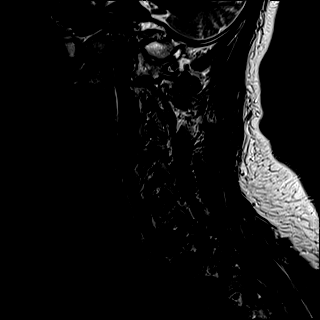
[im 6/15]
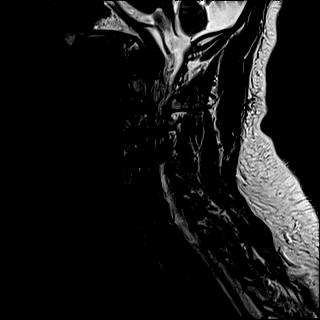
[im 9/15]
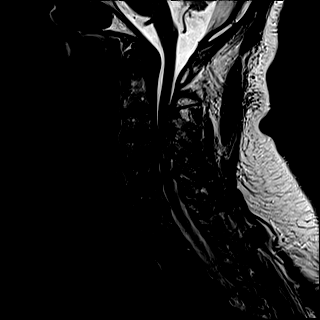
[im 12/15]
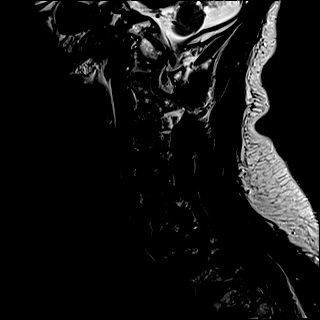
[im 15/15]
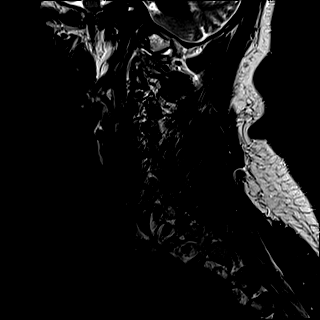

[Series 6: FLAIR · sagittal · 3.0mm · 0.78mm/px · 7 of 15 slices shown]
[im 1/15]
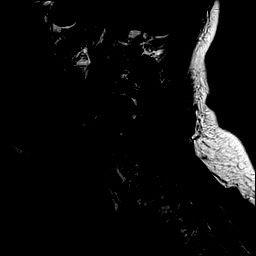
[im 3/15]
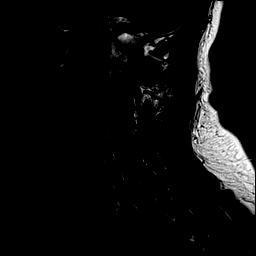
[im 5/15]
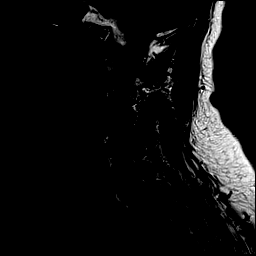
[im 8/15]
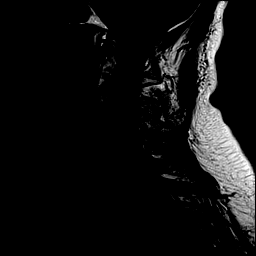
[im 10/15]
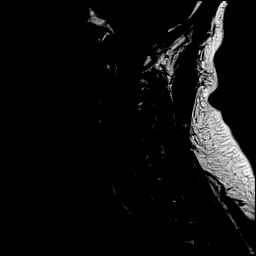
[im 12/15]
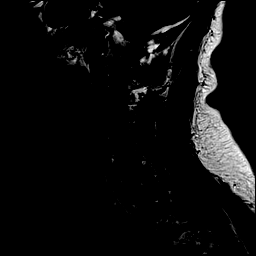
[im 15/15]
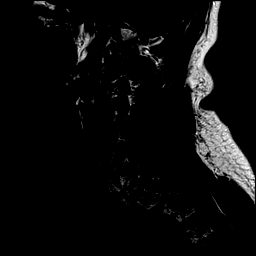

[Series 7: STIR · sagittal · 3.0mm · 0.62mm/px · 7 of 15 slices shown]
[im 1/15]
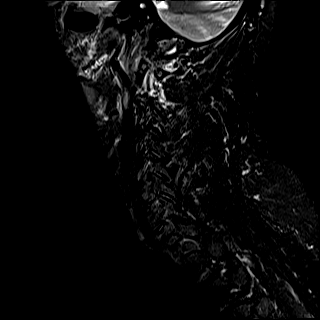
[im 3/15]
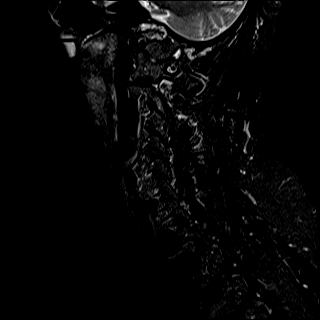
[im 5/15]
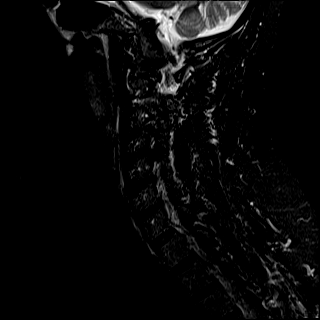
[im 8/15]
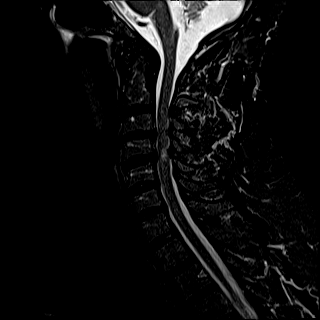
[im 10/15]
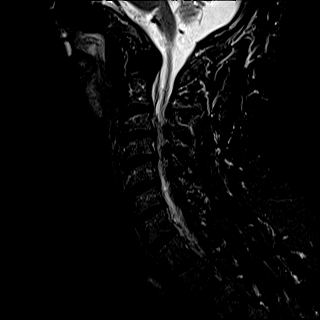
[im 12/15]
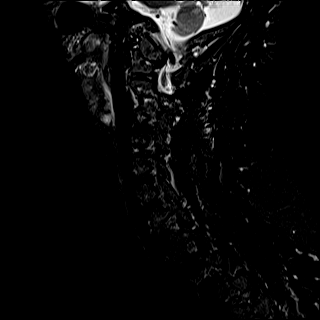
[im 15/15]
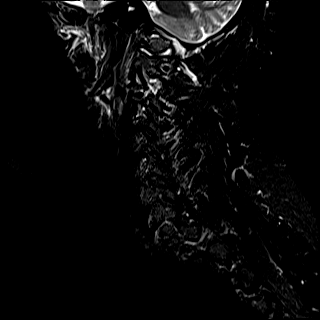

[Series 8: T2 · axial · 3.0mm · 0.70mm/px · z∈[-115,-10]mm · 10 of 31 slices shown (2 of 2)]
[im 1/31]
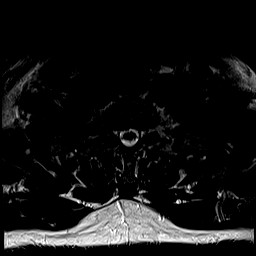
[im 3/31]
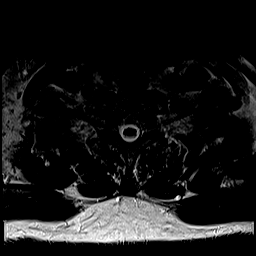
[im 5/31]
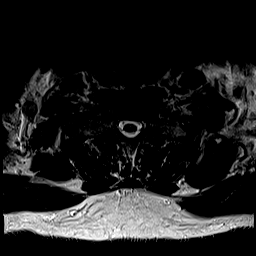
[im 7/31]
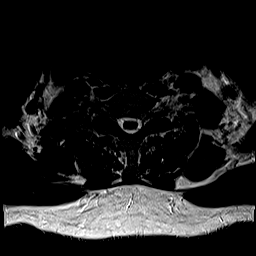
[im 10/31]
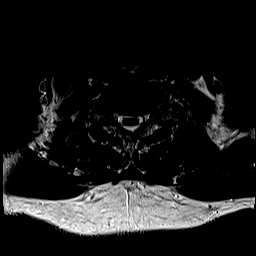
[im 14/31]
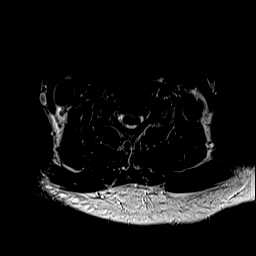
[im 17/31]
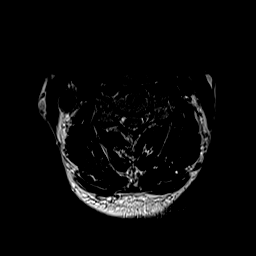
[im 21/31]
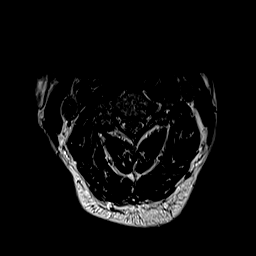
[im 26/31]
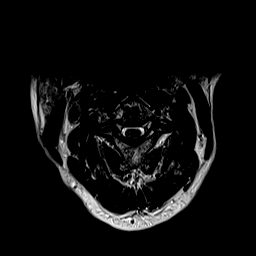
[im 31/31]
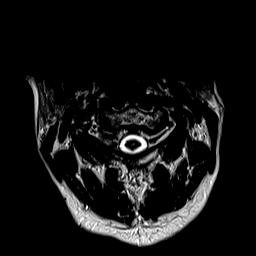

[Series 9: ax mpgr · axial · 3.0mm · 0.35mm/px · z∈[-115,-10]mm · 8 of 32 slices shown]
[im 1/32]
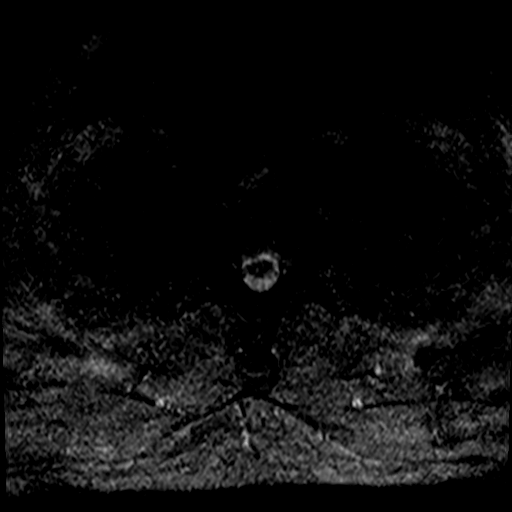
[im 5/32]
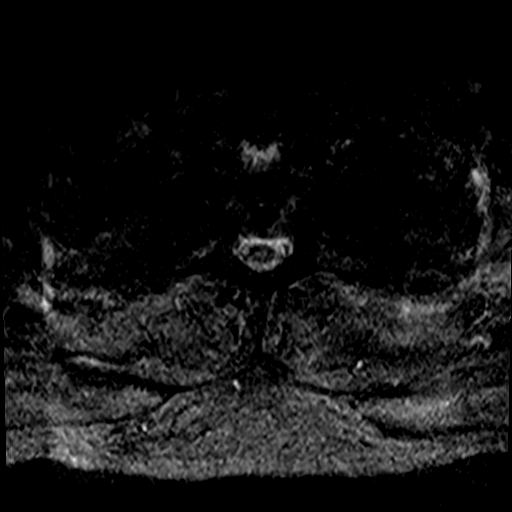
[im 10/32]
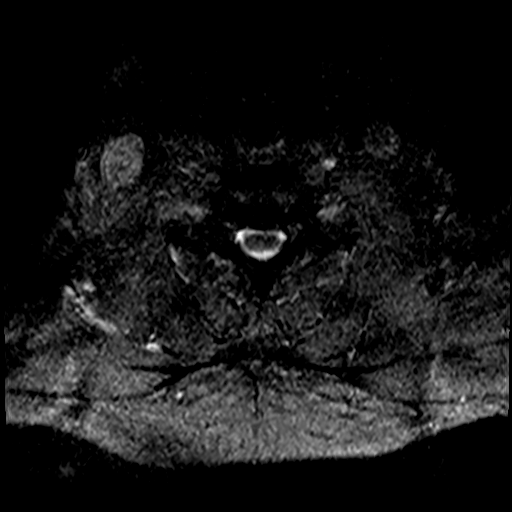
[im 15/32]
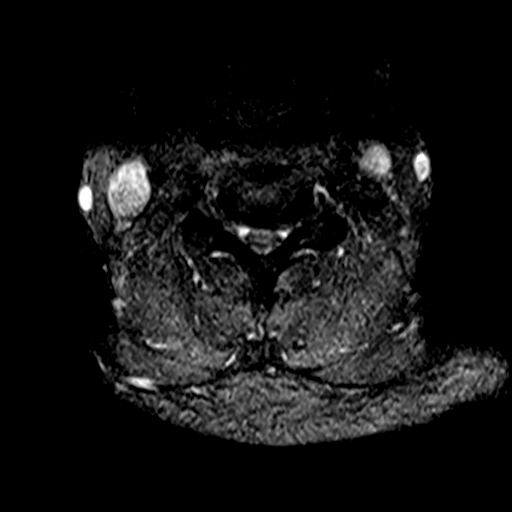
[im 17/32]
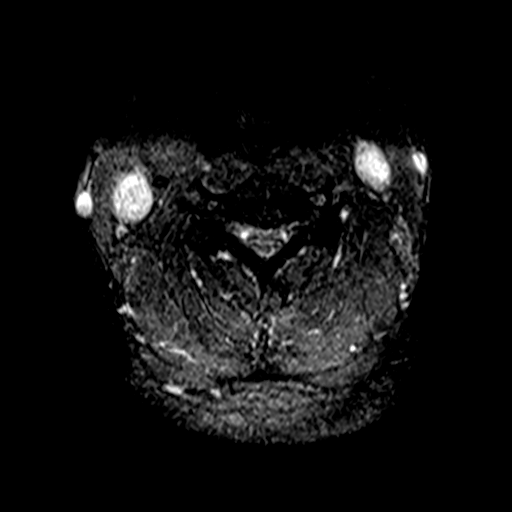
[im 22/32]
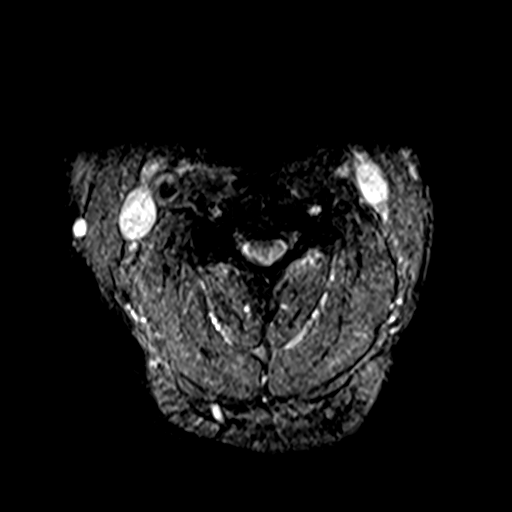
[im 27/32]
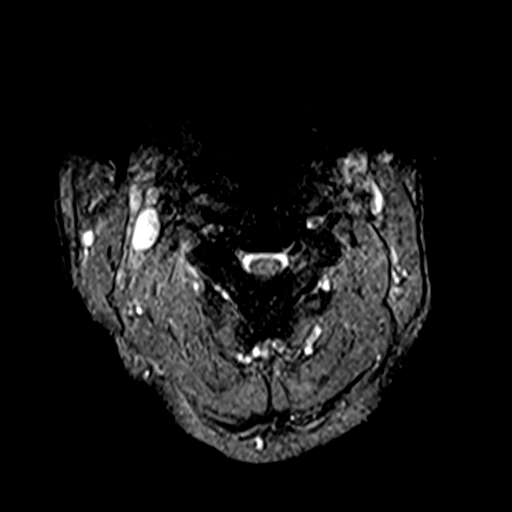
[im 32/32]
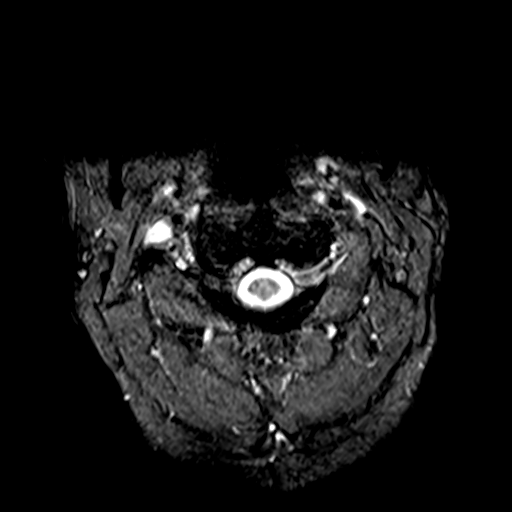

[38 of 48 positions shown; findings below may reference images not displayed]

FINDINGS: Alignment: Normal alignment.

Vertebrae: Vertebral body heights are maintained. No specific
evidence of acute fracture discitis/osteomyelitis. No suspicious
bone lesion.

Cord: There is similar T2 hyperintensity within the cord at C4-C5.

Posterior Fossa, vertebral arteries, paraspinal tissues: Visualized
vertebral artery flow voids are maintained. Only the inferior-most
posterior fossa is imaged sagittally, which is unremarkable on
limited assessment.

Disc levels:

C2-C3: Posterior disc osteophyte complex and right greater than left
facet and uncovertebral hypertrophy with mild right foraminal
stenosis, similar to prior. No significant canal or left foraminal
stenosis.

C3-C4: Posterior disc osteophyte complex and right greater than left
facet and uncovertebral hypertrophy. Similar moderate to severe
canal. Similar severe right greater than left foraminal stenosis.

C4-C5: Posterior disc osteophyte complex and bilateral uncovertebral
hypertrophy. Similar moderate to severe canal stenosis with
flattening of the ventral cord and T2 hyperintensity within the
cord. Similar moderate to severe right greater than left foraminal
stenosis.

C5-C6: Posterior disc osteophyte complex and bilateral uncovertebral
hypertrophy. Similar mild canal stenosis and moderate left and mild
right foraminal stenosis.

C6-C7: Posterior disc osteophyte complex with similar mild canal and
bilateral foraminal stenosis.

C7-T1: No significant disc protrusion, foraminal stenosis, or canal
stenosis.
IMPRESSION: 1. Similar moderate to severe canal stenosis at C3-C4 and C4-C5 with
myelomalacia in the cord due to compressive myelopathy.
2. Similar severe bilateral foraminal stenosis at C3-C4, moderate to
severe bilateral foraminal stenosis at C4-C5, and moderate left
foraminal stenosis at C5-C6.

## 2023-01-13 DIAGNOSIS — G3184 Mild cognitive impairment, so stated: Secondary | ICD-10-CM | POA: Insufficient documentation

## 2023-01-28 DIAGNOSIS — K7460 Unspecified cirrhosis of liver: Secondary | ICD-10-CM | POA: Insufficient documentation
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
## 2023-03-01 NOTE — Patient Instructions (Signed)
____________________________________________________________________________________________  Opioid Pain Medication Update  To: All patients taking opioid pain medications. (I.e.: hydrocodone, hydromorphone, oxycodone, oxymorphone, morphine, codeine, methadone, tapentadol, tramadol, buprenorphine, fentanyl, etc.)  Re: Updated review of side effects and adverse reactions of opioid analgesics, as well as new information about long term effects of this class of medications.  Direct risks of long-term opioid therapy are not limited to opioid addiction and overdose. Potential medical risks include serious fractures, breathing problems during sleep, hyperalgesia, immunosuppression, chronic constipation, bowel obstruction, myocardial infarction, and tooth decay secondary to xerostomia.  Unpredictable adverse effects that can occur even if you take your medication correctly: Cognitive impairment, respiratory depression, and death. Most people think that if they take their medication "correctly", and "as instructed", that they will be safe. Nothing could be farther from the truth. In reality, a significant amount of recorded deaths associated with the use of opioids has occurred in individuals that had taken the medication for a long time, and were taking their medication correctly. The following are examples of how this can happen: Patient taking his/her medication for a long time, as instructed, without any side effects, is given a certain antibiotic or another unrelated medication, which in turn triggers a "Drug-to-drug interaction" leading to disorientation, cognitive impairment, impaired reflexes, respiratory depression or an untoward event leading to serious bodily harm or injury, including death.  Patient taking his/her medication for a long time, as instructed, without any side effects, develops an acute impairment of liver and/or kidney function. This will lead to a rapid inability of the body to  breakdown and eliminate their pain medication, which will result in effects similar to an "overdose", but with the same medicine and dose that they had always taken. This again may lead to disorientation, cognitive impairment, impaired reflexes, respiratory depression or an untoward event leading to serious bodily harm or injury, including death.  A similar problem will occur with patients as they grow older and their liver and kidney function begins to decrease as part of the aging process.  Background information: Historically, the original case for using long-term opioid therapy to treat chronic noncancer pain was based on safety assumptions that subsequent experience has called into question. In 1996, the American Pain Society and the American Academy of Pain Medicine issued a consensus statement supporting long-term opioid therapy. This statement acknowledged the dangers of opioid prescribing but concluded that the risk for addiction was low; respiratory depression induced by opioids was short-lived, occurred mainly in opioid-naive patients, and was antagonized by pain; tolerance was not a common problem; and efforts to control diversion should not constrain opioid prescribing. This has now proven to be wrong. Experience regarding the risks for opioid addiction, misuse, and overdose in community practice has failed to support these assumptions.  According to the Centers for Disease Control and Prevention, fatal overdoses involving opioid analgesics have increased sharply over the past decade. Currently, more than 96,700 people die from drug overdoses every year. Opioids are a factor in 7 out of every 10 overdose deaths. Deaths from drug overdose have surpassed motor vehicle accidents as the leading cause of death for individuals between the ages of 80 and 61.  Clinical data suggest that neuroendocrine dysfunction may be very common in both men and women, potentially causing hypogonadism, erectile  dysfunction, infertility, decreased libido, osteoporosis, and depression. Recent studies linked higher opioid dose to increased opioid-related mortality. Controlled observational studies reported that long-term opioid therapy may be associated with increased risk for cardiovascular events. Subsequent meta-analysis concluded  that the safety of long-term opioid therapy in elderly patients has not been proven.   Side Effects and adverse reactions: Common side effects: Drowsiness (sedation). Dizziness. Nausea and vomiting. Constipation. Physical dependence -- Dependence often manifests with withdrawal symptoms when opioids are discontinued or decreased. Tolerance -- As you take repeated doses of opioids, you require increased medication to experience the same effect of pain relief. Respiratory depression -- This can occur in healthy people, especially with higher doses. However, people with COPD, asthma or other lung conditions may be even more susceptible to fatal respiratory impairment.  Uncommon side effects: An increased sensitivity to feeling pain and extreme response to pain (hyperalgesia). Chronic use of opioids can lead to this. Delayed gastric emptying (the process by which the contents of your stomach are moved into your small intestine). Muscle rigidity. Immune system and hormonal dysfunction. Quick, involuntary muscle jerks (myoclonus). Arrhythmia. Itchy skin (pruritus). Dry mouth (xerostomia).  Long-term side effects: Chronic constipation. Sleep-disordered breathing (SDB). Increased risk of bone fractures. Hypothalamic-pituitary-adrenal dysregulation. Increased risk of overdose.  RISKS: Respiratory depression and death: Opioids increase the risk of respiratory depression and death.  Drug-to-drug interactions: Opioids are relatively contraindicated in combination with benzodiazepines, sleep inducers, and other central nervous system depressants. Other classes of medications  (i.e.: certain antibiotics and even over-the-counter medications) may also trigger or induce respiratory depression in some patients.  Medical conditions: Patients with pre-existing respiratory problems are at higher risk of respiratory failure and/or depression when in combination with opioid analgesics. Opioids are relatively contraindicated in some medical conditions such as central sleep apnea.   Fractures and Falls:  Opioids increase the risk and incidence of falls. This is of particular importance in elderly patients.  Endocrine System:  Long-term administration is associated with endocrine abnormalities (endocrinopathies). (Also known as Opioid-induced Endocrinopathy) Influences on both the hypothalamic-pituitary-adrenal axis?and the hypothalamic-pituitary-gonadal axis have been demonstrated with consequent hypogonadism and adrenal insufficiency in both sexes. Hypogonadism and decreased levels of dehydroepiandrosterone sulfate have been reported in men and women. Endocrine effects include: Amenorrhoea in women (abnormal absence of menstruation) Reduced libido in both sexes Decreased sexual function Erectile dysfunction in men Hypogonadisms (decreased testicular function with shrinkage of testicles) Infertility Depression and fatigue Loss of muscle mass Anxiety Depression Immune suppression Hyperalgesia Weight gain Anemia Osteoporosis Patients (particularly women of childbearing age) should avoid opioids. There is insufficient evidence to recommend routine monitoring of asymptomatic patients taking opioids in the long-term for hormonal deficiencies.  Immune System: Human studies have demonstrated that opioids have an immunomodulating effect. These effects are mediated via opioid receptors both on immune effector cells and in the central nervous system. Opioids have been demonstrated to have adverse effects on antimicrobial response and anti-tumour surveillance. Buprenorphine has  been demonstrated to have no impact on immune function.  Opioid Induced Hyperalgesia: Human studies have demonstrated that prolonged use of opioids can lead to a state of abnormal pain sensitivity, sometimes called opioid induced hyperalgesia (OIH). Opioid induced hyperalgesia is not usually seen in the absence of tolerance to opioid analgesia. Clinically, hyperalgesia may be diagnosed if the patient on long-term opioid therapy presents with increased pain. This might be qualitatively and anatomically distinct from pain related to disease progression or to breakthrough pain resulting from development of opioid tolerance. Pain associated with hyperalgesia tends to be more diffuse than the pre-existing pain and less defined in quality. Management of opioid induced hyperalgesia requires opioid dose reduction.  Cancer: Chronic opioid therapy has been associated with an increased risk of cancer  among noncancer patients with chronic pain. This association was more evident in chronic strong opioid users. Chronic opioid consumption causes significant pathological changes in the small intestine and colon. Epidemiological studies have found that there is a link between opium dependence and initiation of gastrointestinal cancers. Cancer is the second leading cause of death after cardiovascular disease. Chronic use of opioids can cause multiple conditions such as GERD, immunosuppression and renal damage as well as carcinogenic effects, which are associated with the incidence of cancers.   Mortality: Long-term opioid use has been associated with increased mortality among patients with chronic non-cancer pain (CNCP).  Prescription of long-acting opioids for chronic noncancer pain was associated with a significantly increased risk of all-cause mortality, including deaths from causes other than overdose.  Reference: Von Korff M, Kolodny A, Deyo RA, Chou R. Long-term opioid therapy reconsidered. Ann Intern Med. 2011  Sep 6;155(5):325-8. doi: 10.7326/[id number]. PMID: 64403474; PMCID: [id number]. Randon Goldsmith, Hayward RA, Dunn KM, [place] KP. Risk of adverse events in patients prescribed long-term opioids: A cohort study in the [place] [facility] Research Datalink. Eur J Pain. 2019 May;23(5):908-922. doi: 10.1002/ejp.1357. Epub 2019 Jan 31. PMID: 43329518. Colameco S, Coren JS, Ciervo CA. Continuous opioid treatment for chronic noncancer pain: a time for moderation in prescribing. Postgrad Med. 2009 Jul;121(4):61-6. doi: 10.3810/pgm.2009.07.2032. PMID: 84166063. William Hamburger RN, [place] SD, Blazina I, Cristopher Peru, Bougatsos C, Deyo RA. The effectiveness and risks of long-term opioid therapy for chronic pain: a systematic review for a [employer] of [employer] Pathways to [employer]. Ann Intern Med. 2015 Feb 17;162(4):276-86. doi: 10.7326/M14-2559. PMID: 01601093. Caryl Bis [facility], Makuc DM. NCHS Data Brief No. 22. [place]: Centers for Disease Control and Prevention; 2009. Sep, Increase in Fatal Poisonings Involving Opioid Analgesics in the [place], 1999-2006. Song IA, Choi HR, Oh TK. Long-term opioid use and mortality in patients with chronic non-cancer pain: Ten-year follow-up study in [place] from 2010 through 2019. EClinicalMedicine. 2022 Jul 18;51:101558. doi: 10.1016/j.eclinm.2022.235573. PMID: 22025427; PMCID: [id number]. Huser, W., Schubert, T., Vogelmann, T. et al. All-cause mortality in patients with long-term opioid therapy compared with non-opioid analgesics for chronic non-cancer pain: a database study. BMC Med 18, 162 (2020). http://lester.info/ Rashidian H, Karie Kirks, Malekzadeh R, Haghdoost AA. An Ecological Study of the Association between Opiate Use and Incidence of Cancers. Addict Health. 2016 Fall;8(4):252-260. PMID: 51761607; PMCID: [id number].  Our Goal: Our goal is to control your  pain with means other than the use of opioid pain medications.  Our Recommendation: Talk to your physician about coming off of these medications. We can assist you with the tapering down and stopping these medicines. Based on the new information, even if you cannot completely stop the medication, a decrease in the dose may be associated with a lesser risk. Ask for other means of controlling the pain. Decrease or eliminate those factors that significantly contribute to your pain such as smoking, obesity, and a diet heavily tilted towards "inflammatory" nutrients.  Last Updated: 02/08/2023   ____________________________________________________________________________________________     ____________________________________________________________________________________________  National Pain Medication Shortage  The [place] is experiencing worsening drug shortages. These have had a negative widespread effect on patient care and treatment. Not expected to improve any time soon. Predicted to last past 2029.   Drug shortage list (generic names) Oxycodone IR Oxycodone/APAP Oxymorphone IR Hydromorphone Hydrocodone/APAP Morphine  Where is the problem?  Manufacturing and supply level.  Will this shortage affect you?  Only if you  take any of the above pain medications.  How? You may be unable to fill your prescription.  Your pharmacist may offer a "partial fill" of your prescription. (Warning: Do not accept partial fills.) Prescriptions partially filled cannot be transferred to another pharmacy. Read our Medication Rules and Regulation. Depending on how much medicine you are dependent on, you may experience withdrawals when unable to get the medication.  Recommendations: Consider ending your dependence on opioid pain medications. Ask your pain specialist to assist you with the process. Consider switching to a medication currently not in shortage, such as Buprenorphine. Talk to your pain  specialist about this option. Consider decreasing your pain medication requirements by managing tolerance thru "Drug Holidays". This may help minimize withdrawals, should you run out of medicine. Control your pain thru the use of non-pharmacological interventional therapies.   Your prescriber: Prescribers cannot be blamed for shortages. Medication manufacturing and supply issues cannot be fixed by the prescriber.   NOTE: The prescriber is not responsible for supplying the medication, or solving supply issues. Work with your pharmacist to solve it. The patient is responsible for the decision to take or continue taking the medication and for identifying and securing a legal supply source. By law, supplying the medication is the job and responsibility of the pharmacy. The prescriber is responsible for the evaluation, monitoring, and prescribing of these medications.   Prescribers will NOT: Re-issue prescriptions that have been partially filled. Re-issue prescriptions already sent to a pharmacy.  Re-send prescriptions to a different pharmacy because yours did not have your medication. Ask pharmacist to order more medicine or transfer the prescription to another pharmacy. (Read below.)  New 2023 regulation: "April 03, 2022 Revised Regulation Allows DEA-Registered Pharmacies to Transfer Electronic Prescriptions at a Patient's Request DEA Headquarters Division - Public Information Office Patients now have the ability to request their electronic prescription be transferred to another pharmacy without having to go back to their practitioner to initiate the request. This revised regulation went into effect on Monday, March 30, 2022.     At a patient's request, a DEA-registered retail pharmacy can now transfer an electronic prescription for a controlled substance (schedules II-V) to another DEA-registered retail pharmacy. Prior to this change, patients would have to go through their practitioner to  cancel their prescription and have it re-issued to a different pharmacy. The process was taxing and time consuming for both patients and practitioners.    The Drug Enforcement Administration [facility]) published its intent to revise the process for transferring electronic prescriptions on June 21, 2020.  The final rule was published in the federal register on February 26, 2022 and went into effect 30 days later.  Under the final rule, a prescription can only be transferred once between pharmacies, and only if allowed under existing state or other applicable law. The prescription must remain in its electronic form; may not be altered in any way; and the transfer must be communicated directly between two licensed pharmacists. It's important to note, any authorized refills transfer with the original prescription, which means the entire prescription will be filled at the same pharmacy".  Reference: [email or web address].is [facility] website announcement)  [email or web address] [employer] of Justice)   [employer] / Vol. 88, No. 143 / Thursday, February 26, 2022 / Rules and Regulations DEPARTMENT OF JUSTICE  Drug Enforcement Administration  21 CFR Part 1306  [Docket No. [id number]]  RIN [id number] Transfer of Electronic Prescriptions for Schedules II-V Controlled Substances Between Pharmacies for Initial Filling  ____________________________________________________________________________________________  ____________________________________________________________________________________________  Transfer of Pain Medication between Pharmacies  Re: 2023 DEA Clarification on existing regulation  Published on DEA Website: April 03, 2022  Title: Revised Regulation Allows DEA-Registered Pharmacies to Electrical engineer Prescriptions at a Patient's  Request DEA Headquarters Division - [employer]  "Patients now have the ability to request their electronic prescription be transferred to another pharmacy without having to go back to their practitioner to initiate the request. This revised regulation went into effect on Monday, March 30, 2022.     At a patient's request, a DEA-registered retail pharmacy can now transfer an electronic prescription for a controlled substance (schedules II-V) to another DEA-registered retail pharmacy. Prior to this change, patients would have to go through their practitioner to cancel their prescription and have it re-issued to a different pharmacy. The process was taxing and time consuming for both patients and practitioners.    The Drug Enforcement Administration [facility]) published its intent to revise the process for transferring electronic prescriptions on June 21, 2020.  The final rule was published in the federal register on February 26, 2022 and went into effect 30 days later.  Under the final rule, a prescription can only be transferred once between pharmacies, and only if allowed under existing state or other applicable law. The prescription must remain in its electronic form; may not be altered in any way; and the transfer must be communicated directly between two licensed pharmacists. It's important to note, any authorized refills transfer with the original prescription, which means the entire prescription will be filled at the same pharmacy."    REFERENCES: 1. DEA website announcement [email or web address]  2. Department of Justice website  [email or web address]  3. DEPARTMENT OF JUSTICE Drug Enforcement Administration 21 CFR Part 1306 [Docket No. [id number]] RIN [id number] "Transfer of Electronic Prescriptions for Schedules II-V Controlled Substances  Between Pharmacies for Initial Filling"  ____________________________________________________________________________________________     _______________________________________________________________________  Medication Rules  Purpose: To inform patients, and their family members, of our medication rules and regulations.  Applies to: All patients receiving prescriptions from our practice (written or electronic).  Pharmacy of record: This is the pharmacy where your electronic prescriptions will be sent. Make sure we have the correct one.  Electronic prescriptions: In compliance with the [facility] Strengthen Opioid Misuse Prevention (STOP) Act of 2017 (Session Conni Elliot [phone]), effective August 03, 2018, all controlled substances must be electronically prescribed. Written prescriptions, faxing, or calling prescriptions to a pharmacy will no longer be done.  Prescription refills: These will be provided only during in-person appointments. No medications will be renewed without a "face-to-face" evaluation with your provider. Applies to all prescriptions.  NOTE: The following applies primarily to controlled substances (Opioid* Pain Medications).   Type of encounter (visit): For patients receiving controlled substances, face-to-face visits are required. (Not an option and not up to the patient.)  Patient's responsibilities: Pain Pills: Bring all pain pills to every appointment (except for procedure appointments). Pill Bottles: Bring pills in original pharmacy bottle. Bring bottle, even if empty. Always bring the bottle of the most recent fill.  Medication refills: You are responsible for knowing and keeping track of what medications you are taking and when is it that you will need a refill. The day before your appointment: write a list of all prescriptions that need to be refilled. The day of the appointment: give the list to the admitting nurse. Prescriptions will be written only  during appointments. No prescriptions will be written on procedure days. If you forget a  medication: it will not be "Called in", "Faxed", or "electronically sent". You will need to get another appointment to get these prescribed. No early refills. Do not call asking to have your prescription filled early. Partial  or short prescriptions: Occasionally your pharmacy may not have enough pills to fill your prescription.  NEVER ACCEPT a partial fill or a prescription that is short of the total amount of pills that you were prescribed.  With controlled substances the law allows 72 hours for the pharmacy to complete the prescription.  If the prescription is not completed within 72 hours, the pharmacist will require a new prescription to be written. This means that you will be short on your medicine and we WILL NOT send another prescription to complete your original prescription.  Instead, request the pharmacy to send a carrier to a nearby branch to get enough medication to provide you with your full prescription. Prescription Accuracy: You are responsible for carefully inspecting your prescriptions before leaving our office. Have the discharge nurse carefully go over each prescription with you, before taking them home. Make sure that your name is accurately spelled, that your address is correct. Check the name and dose of your medication to make sure it is accurate. Check the number of pills, and the written instructions to make sure they are clear and accurate. Make sure that you are given enough medication to last until your next medication refill appointment. Taking Medication: Take medication as prescribed. When it comes to controlled substances, taking less pills or less frequently than prescribed is permitted and encouraged. Never take more pills than instructed. Never take the medication more frequently than prescribed.  Inform other Doctors: Always inform, all of your healthcare providers, of all the  medications you take. Pain Medication from other Providers: You are not allowed to accept any additional pain medication from any other Doctor or Healthcare provider. There are two exceptions to this rule. (see below) In the event that you require additional pain medication, you are responsible for notifying us, as stated below. Cough Medicine: Often these contain an opioid, such as codeine or hydrocodone. Never accept or take cough medicine containing these opioids if you are already taking an opioid* medication. The combination may cause respiratory failure and death. Medication Agreement: You are responsible for carefully reading and following our Medication Agreement. This must be signed before receiving any prescriptions from our practice. Safely store a copy of your signed Agreement. Violations to the Agreement will result in no further prescriptions. (Additional copies of our Medication Agreement are available upon request.) Laws, Rules, & Regulations: All patients are expected to follow all [address] and [employer], [employer], Rules, [place] [employer]. Ignorance of the Laws does not constitute a valid excuse.  Illegal drugs and Controlled Substances: The use of illegal substances (including, but not limited to marijuana and its derivatives) and/or the illegal use of any controlled substances is strictly prohibited. Violation of this rule may result in the immediate and permanent discontinuation of any and all prescriptions being written by our practice. The use of any illegal substances is prohibited. Adopted CDC guidelines & recommendations: Target dosing levels will be at or below 60 MME/day. Use of benzodiazepines** is not recommended.  Exceptions: There are only two exceptions to the rule of not receiving pain medications from other Healthcare Providers. Exception #1 (Emergencies): In the event of an emergency (i.e.: accident requiring emergency care), you are allowed to receive additional pain  medication. However, you are responsible for: As soon as  you are able, call our office [phone], at any time of the day or night, and leave a message stating your name, the date and nature of the emergency, and the name and dose of the medication prescribed. In the event that your call is answered by a member of our staff, make sure to document and save the date, time, and the name of the person that took your information.  Exception #2 (Planned Surgery): In the event that you are scheduled by another doctor or dentist to have any type of surgery or procedure, you are allowed (for a period no longer than 30 days), to receive additional pain medication, for the acute post-op pain. However, in this case, you are responsible for picking up a copy of our "Post-op Pain Management for Surgeons" handout, and giving it to your surgeon or dentist. This document is available at our office, and does not require an appointment to obtain it. Simply go to our office during business hours (Monday-Thursday from 8:00 AM to 4:00 PM) (Friday 8:00 AM to 12:00 Noon) or if you have a scheduled appointment with [place], prior to your surgery, and ask for it by name. In addition, you are responsible for: calling our office [phone], at any time of the day or night, and leaving a message stating your name, name of your surgeon, type of surgery, and date of procedure or surgery. Failure to comply with your responsibilities may result in termination of therapy involving the controlled substances. Medication Agreement Violation. Following the above rules, including your responsibilities will help you in avoiding a Medication Agreement Violation ("Breaking your Pain Medication Contract").  Consequences:  Not following the above rules may result in permanent discontinuation of medication prescription therapy.  *Opioid medications include: morphine, codeine, oxycodone, oxymorphone, hydrocodone, hydromorphone, meperidine, tramadol,  tapentadol, buprenorphine, fentanyl, methadone. **Benzodiazepine medications include: diazepam (Valium), alprazolam (Xanax), clonazepam (Klonopine), lorazepam (Ativan), clorazepate (Tranxene), chlordiazepoxide (Librium), estazolam (Prosom), oxazepam (Serax), temazepam (Restoril), triazolam (Halcion) (Last updated: 05/26/2022) ______________________________________________________________________    ______________________________________________________________________  Medication Recommendations and Reminders  Applies to: All patients receiving prescriptions (written and/or electronic).  Medication Rules & Regulations: You are responsible for reading, knowing, and following our "Medication Rules" document. These exist for your safety and that of others. They are not flexible and neither are we. Dismissing or ignoring them is an act of "non-compliance" that may result in complete and irreversible termination of such medication therapy. For safety reasons, "non-compliance" will not be tolerated. As with the [place] fundamental legal principle of "ignorance of the law is no defense", we will accept no excuses for not having read and knowing the content of documents provided to you by our practice.  Pharmacy of record:  Definition: This is the pharmacy where your electronic prescriptions will be sent.  We do not endorse any particular pharmacy. It is up to you and your insurance to decide what pharmacy to use.  We do not restrict you in your choice of pharmacy. However, once we write for your prescriptions, we will NOT be re-sending more prescriptions to fix restricted supply problems created by your pharmacy, or your insurance.  The pharmacy listed in the electronic medical record should be the one where you want electronic prescriptions to be sent. If you choose to change pharmacy, simply notify our nursing staff. Changes will be made only during your regular appointments and not over the  phone.  Recommendations: Keep all of your pain medications in a safe place, under lock and key, even  if you live alone. We will NOT replace lost, stolen, or damaged medication. We do not accept "Police Reports" as proof of medications having been stolen. After you fill your prescription, take 1 week's worth of pills and put them away in a safe place. You should keep a separate, properly labeled bottle for this purpose. The remainder should be kept in the original bottle. Use this as your primary supply, until it runs out. Once it's gone, then you know that you have 1 week's worth of medicine, and it is time to come in for a prescription refill. If you do this correctly, it is unlikely that you will ever run out of medicine. To make sure that the above recommendation works, it is very important that you make sure your medication refill appointments are scheduled at least 1 week before you run out of medicine. To do this in an effective manner, make sure that you do not leave the office without scheduling your next medication management appointment. Always ask the nursing staff to show you in your prescription , when your medication will be running out. Then arrange for the receptionist to get you a return appointment, at least 7 days before you run out of medicine. Do not wait until you have 1 or 2 pills left, to come in. This is very poor planning and does not take into consideration that we may need to cancel appointments due to bad weather, sickness, or emergencies affecting our staff. DO NOT ACCEPT A "Partial Fill": If for any reason your pharmacy does not have enough pills/tablets to completely fill or refill your prescription, do not allow for a "partial fill". The law allows the pharmacy to complete that prescription within 72 hours, without requiring a new prescription. If they do not fill the rest of your prescription within those 72 hours, you will need a separate prescription to fill the remaining  amount, which we will NOT provide. If the reason for the partial fill is your insurance, you will need to talk to the pharmacist about payment alternatives for the remaining tablets, but again, DO NOT ACCEPT A PARTIAL FILL, unless you can trust your pharmacist to obtain the remainder of the pills within 72 hours.  Prescription refills and/or changes in medication(s):  Prescription refills, and/or changes in dose or medication, will be conducted only during scheduled medication management appointments. (Applies to both, written and electronic prescriptions.) No refills on procedure days. No medication will be changed or started on procedure days. No changes, adjustments, and/or refills will be conducted on a procedure day. Doing so will interfere with the diagnostic portion of the procedure. No phone refills. No medications will be "called into the pharmacy". No Fax refills. No weekend refills. No Holliday refills. No after hours refills.  Remember:  Business hours are:  Monday to Thursday 8:00 AM to 4:00 PM Provider's Schedule: Delano Metz, MD - Appointments are:  Medication management: Monday and Wednesday 8:00 AM to 4:00 PM Procedure day: Tuesday and Thursday 7:30 AM to 4:00 PM Edward Jolly, MD - Appointments are:  Medication management: Tuesday and Thursday 8:00 AM to 4:00 PM Procedure day: Monday and Wednesday 7:30 AM to 4:00 PM (Last update: 05/26/2022) ______________________________________________________________________   ____________________________________________________________________________________________  Naloxone Nasal Spray  Why am I receiving this medication? [place] STOP ACT requires that all patients taking high dose opioids or at risk of opioids respiratory depression, be prescribed an opioid reversal agent, such as Naloxone (AKA: Narcan).  What is this medication? NALOXONE (  nal OX one) treats opioid overdose, which causes slow or shallow breathing,  severe drowsiness, or trouble staying awake. Call emergency services after using this medication. You may need additional treatment. Naloxone works by reversing the effects of opioids. It belongs to a group of medications called opioid blockers.  COMMON BRAND NAME(S): Kloxxado, Narcan  What should I tell my care team before I take this medication? They need to know if you have any of these conditions: Heart disease Substance use disorder An unusual or allergic reaction to naloxone, other medications, foods, dyes, or preservatives Pregnant or trying to get pregnant Breast-feeding  When to use this medication? This medication is to be used for the treatment of respiratory depression (less than 8 breaths per minute) secondary to opioid overdose.   How to use this medication? This medication is for use in the nose. Lay the person on their back. Support their neck with your hand and allow the head to tilt back before giving the medication. The nasal spray should be given into 1 nostril. After giving the medication, move the person onto their side. Do not remove or test the nasal spray until ready to use. Get emergency medical help right away after giving the first dose of this medication, even if the person wakes up. You should be familiar with how to recognize the signs and symptoms of a narcotic overdose. If more doses are needed, give the additional dose in the other nostril. Talk to your care team about the use of this medication in children. While this medication may be prescribed for children as young as newborns for selected conditions, precautions do apply.  Naloxone Overdosage: If you think you have taken too much of this medicine contact a poison control center or emergency room at once.  NOTE: This medicine is only for you. Do not share this medicine with others.  What if I miss a dose? This does not apply.  What may interact with this medication? This is only used during an  emergency. No interactions are expected during emergency use. This list may not describe all possible interactions. Give your health care provider a list of all the medicines, herbs, non-prescription drugs, or dietary supplements you use. Also tell them if you smoke, drink alcohol, or use illegal drugs. Some items may interact with your medicine.  What should I watch for while using this medication? Keep this medication ready for use in the case of an opioid overdose. Make sure that you have the phone number of your care team and local hospital ready. You may need to have additional doses of this medication. Each nasal spray contains a single dose. Some emergencies may require additional doses. After use, bring the treated person to the nearest hospital or call 911. Make sure the treating care team knows that the person has received a dose of this medication. You will receive additional instructions on what to do during and after use of this medication before an emergency occurs.  What side effects may I notice from receiving this medication? Side effects that you should report to your care team as soon as possible: Allergic reactions--skin rash, itching, hives, swelling of the face, lips, tongue, or throat Side effects that usually do not require medical attention (report these to your care team if they continue or are bothersome): Constipation Dryness or irritation inside the nose Headache Increase in blood pressure Muscle spasms Stuffy nose Toothache This list may not describe all possible side effects. Call your doctor for  medical advice about side effects. You may report side effects to FDA at 1-800-FDA-1088.  Where should I keep my medication? Because this is an emergency medication, you should keep it with you at all times.  Keep out of the reach of children and pets. Store between 20 and 25 degrees C (68 and 77 degrees F). Do not freeze. Throw away any unused medication after the  expiration date. Keep in original box until ready to use.  NOTE: This sheet is a summary. It may not cover all possible information. If you have questions about this medicine, talk to your doctor, pharmacist, or health care provider.   2023 Elsevier/Gold Standard (2021-03-28 00:00:00)  ____________________________________________________________________________________________

## 2023-03-03 ENCOUNTER — Ambulatory Visit: Payer: Medicare Other | Attending: Pain Medicine | Admitting: Pain Medicine

## 2023-03-03 ENCOUNTER — Encounter: Payer: Self-pay | Admitting: Pain Medicine

## 2023-03-03 VITALS — BP 139/73 | HR 68 | Temp 98.1°F | Resp 18 | Ht 68.0 in | Wt 167.0 lb

## 2023-03-03 DIAGNOSIS — Z79899 Other long term (current) drug therapy: Secondary | ICD-10-CM | POA: Diagnosis present

## 2023-03-03 DIAGNOSIS — G8929 Other chronic pain: Secondary | ICD-10-CM | POA: Diagnosis present

## 2023-03-03 DIAGNOSIS — M5442 Lumbago with sciatica, left side: Secondary | ICD-10-CM | POA: Diagnosis present

## 2023-03-03 DIAGNOSIS — M79601 Pain in right arm: Secondary | ICD-10-CM | POA: Diagnosis present

## 2023-03-03 DIAGNOSIS — G894 Chronic pain syndrome: Secondary | ICD-10-CM | POA: Diagnosis present

## 2023-03-03 DIAGNOSIS — M47816 Spondylosis without myelopathy or radiculopathy, lumbar region: Secondary | ICD-10-CM | POA: Insufficient documentation

## 2023-03-03 DIAGNOSIS — M5030 Other cervical disc degeneration, unspecified cervical region: Secondary | ICD-10-CM | POA: Insufficient documentation

## 2023-03-03 DIAGNOSIS — M79605 Pain in left leg: Secondary | ICD-10-CM | POA: Diagnosis present

## 2023-03-03 DIAGNOSIS — F1190 Opioid use, unspecified, uncomplicated: Secondary | ICD-10-CM | POA: Diagnosis present

## 2023-03-03 DIAGNOSIS — M79604 Pain in right leg: Secondary | ICD-10-CM

## 2023-03-03 DIAGNOSIS — M5137 Other intervertebral disc degeneration, lumbosacral region: Secondary | ICD-10-CM | POA: Diagnosis present

## 2023-03-03 DIAGNOSIS — M542 Cervicalgia: Secondary | ICD-10-CM | POA: Insufficient documentation

## 2023-03-03 DIAGNOSIS — Z79891 Long term (current) use of opiate analgesic: Secondary | ICD-10-CM | POA: Diagnosis present

## 2023-03-03 DIAGNOSIS — M79602 Pain in left arm: Secondary | ICD-10-CM | POA: Diagnosis present

## 2023-03-03 MED ORDER — OXYCODONE HCL 10 MG PO TABS
10.0000 mg | ORAL_TABLET | Freq: Four times a day (QID) | ORAL | 0 refills | Status: DC | PRN
Start: 2023-05-02 — End: 2023-05-24

## 2023-03-03 MED ORDER — OXYCODONE HCL 10 MG PO TABS
10.0000 mg | ORAL_TABLET | Freq: Four times a day (QID) | ORAL | 0 refills | Status: DC | PRN
Start: 2023-04-02 — End: 2023-05-24

## 2023-03-03 MED ORDER — OXYCODONE HCL 10 MG PO TABS
10.0000 mg | ORAL_TABLET | Freq: Four times a day (QID) | ORAL | 0 refills | Status: DC | PRN
Start: 2023-03-03 — End: 2023-05-24

## 2023-03-03 MED ORDER — NALOXONE HCL 4 MG/0.1ML NA LIQD
1.0000 | NASAL | 0 refills | Status: AC | PRN
Start: 2023-03-03 — End: 2024-03-02

## 2023-03-03 NOTE — Progress Notes (Signed)
Nursing Pain Medication Assessment:  Safety precautions to be maintained throughout the outpatient stay will include: orient to surroundings, keep bed in low position, maintain call bell within reach at all times, provide assistance with transfer out of bed and ambulation.  Medication Inspection Compliance: Pill count conducted under aseptic conditions, in front of the patient. Neither the pills nor the bottle was removed from the patient's sight at any time. Once count was completed pills were immediately returned to the patient in their original bottle.  Medication: Oxycodone IR Pill/Patch Count:  0 of 90 pills remain Pill/Patch Appearance: Markings consistent with prescribed medication Bottle Appearance: Standard pharmacy container. Clearly labeled. Filled Date: 06 / 24 / 2024 Last Medication intake:  Today

## 2023-04-03 ENCOUNTER — Other Ambulatory Visit: Payer: Self-pay | Admitting: Cardiovascular Disease

## 2023-04-03 DIAGNOSIS — E782 Mixed hyperlipidemia: Secondary | ICD-10-CM

## 2023-04-03 DIAGNOSIS — I10 Essential (primary) hypertension: Secondary | ICD-10-CM

## 2023-04-19 ENCOUNTER — Ambulatory Visit (HOSPITAL_COMMUNITY): Payer: Medicare Other | Attending: Internal Medicine

## 2023-04-26 ENCOUNTER — Ambulatory Visit: Payer: Medicare Other | Attending: Cardiovascular Disease | Admitting: Cardiovascular Disease

## 2023-04-26 ENCOUNTER — Encounter: Payer: Self-pay | Admitting: Cardiovascular Disease

## 2023-04-26 VITALS — BP 124/68 | HR 68 | Ht 68.0 in | Wt 173.0 lb

## 2023-04-26 DIAGNOSIS — E782 Mixed hyperlipidemia: Secondary | ICD-10-CM | POA: Diagnosis not present

## 2023-04-26 DIAGNOSIS — I10 Essential (primary) hypertension: Secondary | ICD-10-CM | POA: Diagnosis not present

## 2023-04-26 DIAGNOSIS — I25119 Atherosclerotic heart disease of native coronary artery with unspecified angina pectoris: Secondary | ICD-10-CM | POA: Diagnosis not present

## 2023-04-26 DIAGNOSIS — I272 Pulmonary hypertension, unspecified: Secondary | ICD-10-CM

## 2023-04-26 NOTE — Patient Instructions (Addendum)
Medication Instructions:  Your physician recommends that you continue on your current medications as directed. Please refer to the Current Medication list given to you today.  *If you need a refill on your cardiac medications before your next appointment, please call your pharmacy*  Lab Work: If you have labs (blood work) drawn today and your tests are completely normal, you will receive your results only by: MyChart Message (if you have MyChart) OR A paper copy in the mail If you have any lab test that is abnormal or we need to change your treatment, we will call you to review the results.  Testing/Procedures: Your physician has requested that you have an echocardiogram in 6 months.Echocardiography is a painless test that uses sound waves to create images of your heart. It provides your doctor with information about the size and shape of your heart and how well your heart's chambers and valves are working. This procedure takes approximately one hour. There are no restrictions for this procedure. Please do NOT wear cologne, perfume, aftershave, or lotions (deodorant is allowed). Please arrive 15 minutes prior to your appointment time.  Please reschedule his echo this week to March, a week prior to his follow-up appointment.  Follow-Up: At [facility], you and your health needs are our priority.  As part of our continuing mission to provide you with exceptional heart care, we have created designated Provider Care Teams.  These Care Teams include your primary Cardiologist (physician) and Advanced Practice Providers (APPs -  Physician Assistants and Nurse Practitioners) who all work together to provide you with the care you need, when you need it.  We recommend signing up for the patient portal called "MyChart".  Sign up information is provided on this After Visit Summary.  MyChart is used to connect with patients for Virtual Visits (Telemedicine).  Patients are able to view lab/test  results, encounter notes, upcoming appointments, etc.  Non-urgent messages can be sent to your provider as well.   To learn more about what you can do with MyChart, go to [email or web address].    Your next appointment:   6 month(s)  Provider:   [name], MD

## 2023-04-26 NOTE — Progress Notes (Signed)
Cardiology Office Note:    Date:  04/26/2023   ID:  [name] [name], DOB [birth year], MRN [id number]  PCP:  Gordan Payment., MD   [facility] Providers Cardiologist:  [name], MD     Referring MD: Gordan Payment., MD   Chief Complaint  Patient presents with   Coronary Artery Disease    History of Present Illness:    [name] [name] is a 72 y.o. male with a hx of:  Coronary artery disease  S/p CABG with early SVG failure S/p PCI of L-LAD anastomosis  Long term DAPT Hyperlipidemia  Hypertension  GERD Chronic pain S/p cholecystectomy Pulmonary hypertension  The patient is here with his wife today.  He is doing well from a cardiac perspective and he denies any chest pain, chest pressure, or shortness of breath.  He has lost a significant amount of weight over the past few years but he feels like this is stabilized.  He said he had no appetite for a long time but he is eating better now.  He denies orthopnea, PND, leg edema, or heart palpitations.  He has developed some cognitive impairment and he is being worked up for that.  Reportedly had a brain MRI that showed no significant abnormalities.  He has reportedly been diagnosed with hepatitis C and cirrhosis and he is pending a GI evaluation for that.  Past Medical History:  Diagnosis Date   CAD (coronary artery disease)    CABG   Chronic pain syndrome    GERD (gastroesophageal reflux disease)    Hyperlipidemia    Hypertension    Radiculitis of right cervical region 09/04/2015    Past Surgical History:  Procedure Laterality Date   CHOLECYSTECTOMY     CORONARY ARTERY BYPASS GRAFT     multivessel   HERNIA REPAIR     LEFT HEART CATHETERIZATION WITH CORONARY/GRAFT ANGIOGRAM N/A 04/21/2013   Procedure: LEFT HEART CATHETERIZATION WITH [name];  Surgeon: Micheline Chapman, MD;  Location: [facility] CATH LAB;  Service: Cardiovascular;  Laterality: N/A;   PCI 2009 with stenting of LIMA-LAD anastamosis      RIGHT HEART CATH N/A 11/18/2022   Procedure: RIGHT HEART CATH;  Surgeon: [name], MD;  Location: [facility] INVASIVE CV LAB;  Service: Cardiovascular;  Laterality: N/A;    Current Medications: Current Meds  Medication Sig   aspirin 81 MG tablet Take 1 tablet (81 mg total) by mouth daily.   atorvastatin (LIPITOR) 20 MG tablet TAKE 1 TABLET BY MOUTH EVERY DAY   Cholecalciferol (VITAMIN D3) 50 MCG (2000 UT) capsule Take 1 capsule (2,000 Units total) by mouth daily.   clopidogrel (PLAVIX) 75 MG tablet TAKE 1 TABLET BY MOUTH EVERY DAY   hydrochlorothiazide (HYDRODIURIL) 25 MG tablet Take 25 mg by mouth daily.   isosorbide mononitrate (IMDUR) 30 MG 24 hr tablet TAKE 1 TABLET BY MOUTH EVERY DAY   metoprolol succinate (TOPROL-XL) 50 MG 24 hr tablet TAKE WITH OR IMMEDIATELY FOLLOWING A MEAL.   naloxone (NARCAN) nasal spray 4 mg/0.1 mL Place 1 spray into the nose as needed for up to 365 doses (for opioid-induced respiratory depresssion). In case of emergency (overdose), spray once into each nostril. If no response within 3 minutes, repeat application and call 911.   nitroGLYCERIN (NITROSTAT) 0.4 MG SL tablet PLACE 1 TABLET (0.4 MG TOTAL) UNDER THE TONGUE EVERY 5 (FIVE) MINUTES AS NEEDED FOR CHEST PAIN.   Oxycodone HCl 10 MG TABS Take 1 tablet (10 mg total) by  mouth every 6 (six) hours as needed (Severe pain). Must last 30 days.   [START ON 05/02/2023] Oxycodone HCl 10 MG TABS Take 1 tablet (10 mg total) by mouth every 6 (six) hours as needed (Severe pain). Must last 30 days.   pantoprazole (PROTONIX) 40 MG tablet TAKE 1 TABLET BY MOUTH EVERY DAY   spironolactone (ALDACTONE) 25 MG tablet Take 0.5 tablets (12.5 mg total) by mouth daily.   vitamin B-12 (CYANOCOBALAMIN) 1000 MCG tablet Take 2,000 mcg by mouth daily.     Allergies:   Gabapentin   Social History   Socioeconomic History   Marital status: Married    Spouse name: Not on file   Number of children: Not on file   Years of education: Not on  file   Highest education level: Not on file  Occupational History   Not on file  Tobacco Use   Smoking status: Former   Smokeless tobacco: Never  Substance and Sexual Activity   Alcohol use: No    Alcohol/week: 0.0 standard drinks of alcohol   Drug use: No   Sexual activity: Not on file  Other Topics Concern   Not on file  Social History Narrative   Not on file   Social Determinants of Health   Financial Resource Strain: Not on file  Food Insecurity: Not on file  Transportation Needs: Not on file  Physical Activity: Not on file  Stress: Not on file  Social Connections: Not on file     Family History: The patient's family history includes Multiple sclerosis in his mother; Stroke in his father.  ROS:   Please see the history of present illness.    All other systems reviewed and are negative.  EKGs/Labs/Other Studies Reviewed:    The following studies were reviewed today: Cardiac Cath 11/18/2022: Mild pulmonary HTN, PVR 2 Woods units, otherwise unremarkable right heart catheterization   RA pressure mean 7 RV pressure 35/1 with an RVEDP of 8 PA pressure 35/10 mean 21 Wedge pressure of 12   Transpulmonary gradient 9 mmHg, cardiac output 4.5 L/min, PVR 2 Wood units      Recent Labs: 11/18/2022: BUN 11; Creatinine, Ser 0.70; Hemoglobin 12.6; Hemoglobin 12.6; Platelets 232; Potassium 3.2; Potassium 3.2; Sodium 140; Sodium 140  Recent Lipid Panel    Component Value Date/Time   CHOL 114 07/17/2020 0948   TRIG 62 07/17/2020 0948   HDL 48 07/17/2020 0948   CHOLHDL 2.4 07/17/2020 0948   CHOLHDL 3.8 06/18/2016 0954   VLDL 19 06/18/2016 0954   LDLCALC 52 07/17/2020 0948   LDLDIRECT 92.7 07/04/2009 0946     Risk Assessment/Calculations:           STOP-Bang Score:          Physical Exam:    VS:  BP 124/68   Pulse 68   Ht 5\' 8"  (1.727 m)   Wt 173 lb (78.5 kg)   SpO2 97%   BMI 26.30 kg/m     Wt Readings from Last 3 Encounters:  04/26/23 173 lb (78.5 kg)   03/03/23 167 lb (75.8 kg)  12/09/22 180 lb (81.6 kg)     GEN:  Well nourished, well developed in no acute distress HEENT: Normal NECK: No JVD; No carotid bruits LYMPHATICS: No lymphadenopathy CARDIAC: RRR, 2/6 systolic murmur at the apex RESPIRATORY:  Clear to auscultation without rales, wheezing or rhonchi  ABDOMEN: Soft, non-tender, non-distended MUSCULOSKELETAL:  No edema; No deformity  SKIN: Warm and dry NEUROLOGIC:  Alert and  oriented x 3 PSYCHIATRIC:  Normal affect   ASSESSMENT:    1. Coronary artery disease involving native coronary artery of native heart with angina pectoris (HCC)   2. Essential hypertension, benign   3. Pulmonary hypertension, unspecified (HCC)   4. Mixed hyperlipidemia    PLAN:    In order of problems listed above:  The patient is stable on his current medical regimen which includes aspirin and clopidogrel.  We have decided to keep him on DAPT with early bypass graft failure and remote stenting of the LIMA to LAD anastomosis.  He remains on isosorbide and metoprolol for antianginal therapy. Blood pressure is well-controlled on his current medical regimen which includes spironolactone, hydrochlorothiazide, and metoprolol succinate. Patient underwent right heart catheterization earlier this year.  This showed very mild pulmonary hypertension with a pulmonary vascular resistance of 2 Wood units.  He will continue with supportive care and risk factor modification. Treated with atorvastatin.  Lipids are reviewed through care everywhere.  His cholesterol is 105 and his LDL is 44.  He will continue his current management.  Overall the patient appears clinically stable.  I will see him back in 6 months with an echocardiogram prior to that visit.  He does not appear to be having any cardiac symptoms at this point.     Medication Adjustments/Labs and Tests Ordered: Current medicines are reviewed at length with the patient today.  Concerns regarding medicines are  outlined above.  No orders of the defined types were placed in this encounter.  No orders of the defined types were placed in this encounter.   Patient Instructions  Medication Instructions:  Your physician recommends that you continue on your current medications as directed. Please refer to the Current Medication list given to you today.  *If you need a refill on your cardiac medications before your next appointment, please call your pharmacy*  Lab Work: If you have labs (blood work) drawn today and your tests are completely normal, you will receive your results only by: MyChart Message (if you have MyChart) OR A paper copy in the mail If you have any lab test that is abnormal or we need to change your treatment, we will call you to review the results.  Testing/Procedures: Your physician has requested that you have an echocardiogram in 6 months.Echocardiography is a painless test that uses sound waves to create images of your heart. It provides your doctor with information about the size and shape of your heart and how well your heart's chambers and valves are working. This procedure takes approximately one hour. There are no restrictions for this procedure. Please do NOT wear cologne, perfume, aftershave, or lotions (deodorant is allowed). Please arrive 15 minutes prior to your appointment time.  Please reschedule his echo this week to March, a week prior to his follow-up appointment.  Follow-Up: At [facility], you and your health needs are our priority.  As part of our continuing mission to provide you with exceptional heart care, we have created designated Provider Care Teams.  These Care Teams include your primary Cardiologist (physician) and Advanced Practice Providers (APPs -  Physician Assistants and Nurse Practitioners) who all work together to provide you with the care you need, when you need it.  We recommend signing up for the patient portal called "MyChart".  Sign  up information is provided on this After Visit Summary.  MyChart is used to connect with patients for Virtual Visits (Telemedicine).  Patients are able to view lab/test results,  encounter notes, upcoming appointments, etc.  Non-urgent messages can be sent to your provider as well.   To learn more about what you can do with MyChart, go to [email or web address].    Your next appointment:   6 month(s)  Provider:   [name], MD        Signed, [name], MD  04/26/2023 5:20 PM    [facility]

## 2023-04-29 ENCOUNTER — Ambulatory Visit (HOSPITAL_COMMUNITY): Payer: Medicare Other
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
## 2023-05-24 ENCOUNTER — Ambulatory Visit: Payer: Medicare Other | Attending: Pain Medicine | Admitting: Pain Medicine

## 2023-05-24 ENCOUNTER — Encounter: Payer: Self-pay | Admitting: Pain Medicine

## 2023-05-24 DIAGNOSIS — M5442 Lumbago with sciatica, left side: Secondary | ICD-10-CM | POA: Insufficient documentation

## 2023-05-24 DIAGNOSIS — M5030 Other cervical disc degeneration, unspecified cervical region: Secondary | ICD-10-CM | POA: Insufficient documentation

## 2023-05-24 DIAGNOSIS — M79601 Pain in right arm: Secondary | ICD-10-CM | POA: Diagnosis present

## 2023-05-24 DIAGNOSIS — M79604 Pain in right leg: Secondary | ICD-10-CM

## 2023-05-24 DIAGNOSIS — G8929 Other chronic pain: Secondary | ICD-10-CM | POA: Insufficient documentation

## 2023-05-24 DIAGNOSIS — Z79899 Other long term (current) drug therapy: Secondary | ICD-10-CM | POA: Diagnosis present

## 2023-05-24 DIAGNOSIS — M79605 Pain in left leg: Secondary | ICD-10-CM | POA: Insufficient documentation

## 2023-05-24 DIAGNOSIS — Z79891 Long term (current) use of opiate analgesic: Secondary | ICD-10-CM | POA: Diagnosis present

## 2023-05-24 DIAGNOSIS — G894 Chronic pain syndrome: Secondary | ICD-10-CM | POA: Diagnosis present

## 2023-05-24 DIAGNOSIS — M79602 Pain in left arm: Secondary | ICD-10-CM | POA: Insufficient documentation

## 2023-05-24 DIAGNOSIS — M542 Cervicalgia: Secondary | ICD-10-CM | POA: Insufficient documentation

## 2023-05-24 DIAGNOSIS — M47816 Spondylosis without myelopathy or radiculopathy, lumbar region: Secondary | ICD-10-CM | POA: Diagnosis present

## 2023-05-24 MED ORDER — OXYCODONE HCL 10 MG PO TABS: 10.0000 mg | ORAL_TABLET | Freq: Four times a day (QID) | ORAL | 0 refills | Status: DC | PRN

## 2023-05-24 MED ORDER — OXYCODONE HCL 10 MG PO TABS
10.0000 mg | ORAL_TABLET | Freq: Four times a day (QID) | ORAL | 0 refills | Status: DC | PRN
Start: 2023-06-01 — End: 2023-08-24

## 2023-05-24 NOTE — Patient Instructions (Signed)
____________________________________________________________________________________________  Opioid Pain Medication Update  To: All patients taking opioid pain medications. (I.e.: hydrocodone, hydromorphone, oxycodone, oxymorphone, morphine, codeine, methadone, tapentadol, tramadol, buprenorphine, fentanyl, etc.)  Re: Updated review of side effects and adverse reactions of opioid analgesics, as well as new information about long term effects of this class of medications.  Direct risks of long-term opioid therapy are not limited to opioid addiction and overdose. Potential medical risks include serious fractures, breathing problems during sleep, hyperalgesia, immunosuppression, chronic constipation, bowel obstruction, myocardial infarction, and tooth decay secondary to xerostomia.  Unpredictable adverse effects that can occur even if you take your medication correctly: Cognitive impairment, respiratory depression, and death. Most people think that if they take their medication "correctly", and "as instructed", that they will be safe. Nothing could be farther from the truth. In reality, a significant amount of recorded deaths associated with the use of opioids has occurred in individuals that had taken the medication for a long time, and were taking their medication correctly. The following are examples of how this can happen: Patient taking his/her medication for a long time, as instructed, without any side effects, is given a certain antibiotic or another unrelated medication, which in turn triggers a "Drug-to-drug interaction" leading to disorientation, cognitive impairment, impaired reflexes, respiratory depression or an untoward event leading to serious bodily harm or injury, including death.  Patient taking his/her medication for a long time, as instructed, without any side effects, develops an acute impairment of liver and/or kidney function. This will lead to a rapid inability of the body to  breakdown and eliminate their pain medication, which will result in effects similar to an "overdose", but with the same medicine and dose that they had always taken. This again may lead to disorientation, cognitive impairment, impaired reflexes, respiratory depression or an untoward event leading to serious bodily harm or injury, including death.  A similar problem will occur with patients as they grow older and their liver and kidney function begins to decrease as part of the aging process.  Background information: Historically, the original case for using long-term opioid therapy to treat chronic noncancer pain was based on safety assumptions that subsequent experience has called into question. In 1996, the American Pain Society and the American Academy of Pain Medicine issued a consensus statement supporting long-term opioid therapy. This statement acknowledged the dangers of opioid prescribing but concluded that the risk for addiction was low; respiratory depression induced by opioids was short-lived, occurred mainly in opioid-naive patients, and was antagonized by pain; tolerance was not a common problem; and efforts to control diversion should not constrain opioid prescribing. This has now proven to be wrong. Experience regarding the risks for opioid addiction, misuse, and overdose in community practice has failed to support these assumptions.  According to the Centers for Disease Control and Prevention, fatal overdoses involving opioid analgesics have increased sharply over the past decade. Currently, more than 96,700 people die from drug overdoses every year. Opioids are a factor in 7 out of every 10 overdose deaths. Deaths from drug overdose have surpassed motor vehicle accidents as the leading cause of death for individuals between the ages of 80 and 61.  Clinical data suggest that neuroendocrine dysfunction may be very common in both men and women, potentially causing hypogonadism, erectile  dysfunction, infertility, decreased libido, osteoporosis, and depression. Recent studies linked higher opioid dose to increased opioid-related mortality. Controlled observational studies reported that long-term opioid therapy may be associated with increased risk for cardiovascular events. Subsequent meta-analysis concluded  that the safety of long-term opioid therapy in elderly patients has not been proven.   Side Effects and adverse reactions: Common side effects: Drowsiness (sedation). Dizziness. Nausea and vomiting. Constipation. Physical dependence -- Dependence often manifests with withdrawal symptoms when opioids are discontinued or decreased. Tolerance -- As you take repeated doses of opioids, you require increased medication to experience the same effect of pain relief. Respiratory depression -- This can occur in healthy people, especially with higher doses. However, people with COPD, asthma or other lung conditions may be even more susceptible to fatal respiratory impairment.  Uncommon side effects: An increased sensitivity to feeling pain and extreme response to pain (hyperalgesia). Chronic use of opioids can lead to this. Delayed gastric emptying (the process by which the contents of your stomach are moved into your small intestine). Muscle rigidity. Immune system and hormonal dysfunction. Quick, involuntary muscle jerks (myoclonus). Arrhythmia. Itchy skin (pruritus). Dry mouth (xerostomia).  Long-term side effects: Chronic constipation. Sleep-disordered breathing (SDB). Increased risk of bone fractures. Hypothalamic-pituitary-adrenal dysregulation. Increased risk of overdose.  RISKS: Respiratory depression and death: Opioids increase the risk of respiratory depression and death.  Drug-to-drug interactions: Opioids are relatively contraindicated in combination with benzodiazepines, sleep inducers, and other central nervous system depressants. Other classes of medications  (i.e.: certain antibiotics and even over-the-counter medications) may also trigger or induce respiratory depression in some patients.  Medical conditions: Patients with pre-existing respiratory problems are at higher risk of respiratory failure and/or depression when in combination with opioid analgesics. Opioids are relatively contraindicated in some medical conditions such as central sleep apnea.   Fractures and Falls:  Opioids increase the risk and incidence of falls. This is of particular importance in elderly patients.  Endocrine System:  Long-term administration is associated with endocrine abnormalities (endocrinopathies). (Also known as Opioid-induced Endocrinopathy) Influences on both the hypothalamic-pituitary-adrenal axis?and the hypothalamic-pituitary-gonadal axis have been demonstrated with consequent hypogonadism and adrenal insufficiency in both sexes. Hypogonadism and decreased levels of dehydroepiandrosterone sulfate have been reported in men and women. Endocrine effects include: Amenorrhoea in women (abnormal absence of menstruation) Reduced libido in both sexes Decreased sexual function Erectile dysfunction in men Hypogonadisms (decreased testicular function with shrinkage of testicles) Infertility Depression and fatigue Loss of muscle mass Anxiety Depression Immune suppression Hyperalgesia Weight gain Anemia Osteoporosis Patients (particularly women of childbearing age) should avoid opioids. There is insufficient evidence to recommend routine monitoring of asymptomatic patients taking opioids in the long-term for hormonal deficiencies.  Immune System: Human studies have demonstrated that opioids have an immunomodulating effect. These effects are mediated via opioid receptors both on immune effector cells and in the central nervous system. Opioids have been demonstrated to have adverse effects on antimicrobial response and anti-tumour surveillance. Buprenorphine has  been demonstrated to have no impact on immune function.  Opioid Induced Hyperalgesia: Human studies have demonstrated that prolonged use of opioids can lead to a state of abnormal pain sensitivity, sometimes called opioid induced hyperalgesia (OIH). Opioid induced hyperalgesia is not usually seen in the absence of tolerance to opioid analgesia. Clinically, hyperalgesia may be diagnosed if the patient on long-term opioid therapy presents with increased pain. This might be qualitatively and anatomically distinct from pain related to disease progression or to breakthrough pain resulting from development of opioid tolerance. Pain associated with hyperalgesia tends to be more diffuse than the pre-existing pain and less defined in quality. Management of opioid induced hyperalgesia requires opioid dose reduction.  Cancer: Chronic opioid therapy has been associated with an increased risk of cancer  among noncancer patients with chronic pain. This association was more evident in chronic strong opioid users. Chronic opioid consumption causes significant pathological changes in the small intestine and colon. Epidemiological studies have found that there is a link between opium dependence and initiation of gastrointestinal cancers. Cancer is the second leading cause of death after cardiovascular disease. Chronic use of opioids can cause multiple conditions such as GERD, immunosuppression and renal damage as well as carcinogenic effects, which are associated with the incidence of cancers.   Mortality: Long-term opioid use has been associated with increased mortality among patients with chronic non-cancer pain (CNCP).  Prescription of long-acting opioids for chronic noncancer pain was associated with a significantly increased risk of all-cause mortality, including deaths from causes other than overdose.  Reference: Von Korff M, Kolodny A, Deyo RA, Chou R. Long-term opioid therapy reconsidered. Ann Intern Med. 2011  Sep 6;155(5):325-8. doi: 10.7326/[id number]. PMID: 64403474; PMCID: [id number]. Randon Goldsmith, Hayward RA, Dunn KM, [place] KP. Risk of adverse events in patients prescribed long-term opioids: A cohort study in the [place] [facility] Research Datalink. Eur J Pain. 2019 May;23(5):908-922. doi: 10.1002/ejp.1357. Epub 2019 Jan 31. PMID: 43329518. Colameco S, Coren JS, Ciervo CA. Continuous opioid treatment for chronic noncancer pain: a time for moderation in prescribing. Postgrad Med. 2009 Jul;121(4):61-6. doi: 10.3810/pgm.2009.07.2032. PMID: 84166063. William Hamburger RN, [place] SD, Blazina I, Cristopher Peru, Bougatsos C, Deyo RA. The effectiveness and risks of long-term opioid therapy for chronic pain: a systematic review for a [employer] of [employer] Pathways to [employer]. Ann Intern Med. 2015 Feb 17;162(4):276-86. doi: 10.7326/M14-2559. PMID: 01601093. Caryl Bis [facility], Makuc DM. NCHS Data Brief No. 22. [place]: Centers for Disease Control and Prevention; 2009. Sep, Increase in Fatal Poisonings Involving Opioid Analgesics in the [place], 1999-2006. Song IA, Choi HR, Oh TK. Long-term opioid use and mortality in patients with chronic non-cancer pain: Ten-year follow-up study in [place] from 2010 through 2019. EClinicalMedicine. 2022 Jul 18;51:101558. doi: 10.1016/j.eclinm.2022.235573. PMID: 22025427; PMCID: [id number]. Huser, W., Schubert, T., Vogelmann, T. et al. All-cause mortality in patients with long-term opioid therapy compared with non-opioid analgesics for chronic non-cancer pain: a database study. BMC Med 18, 162 (2020). http://lester.info/ Rashidian H, Karie Kirks, Malekzadeh R, Haghdoost AA. An Ecological Study of the Association between Opiate Use and Incidence of Cancers. Addict Health. 2016 Fall;8(4):252-260. PMID: 51761607; PMCID: [id number].  Our Goal: Our goal is to control your  pain with means other than the use of opioid pain medications.  Our Recommendation: Talk to your physician about coming off of these medications. We can assist you with the tapering down and stopping these medicines. Based on the new information, even if you cannot completely stop the medication, a decrease in the dose may be associated with a lesser risk. Ask for other means of controlling the pain. Decrease or eliminate those factors that significantly contribute to your pain such as smoking, obesity, and a diet heavily tilted towards "inflammatory" nutrients.  Last Updated: 02/08/2023   ____________________________________________________________________________________________     ____________________________________________________________________________________________  National Pain Medication Shortage  The [place] is experiencing worsening drug shortages. These have had a negative widespread effect on patient care and treatment. Not expected to improve any time soon. Predicted to last past 2029.   Drug shortage list (generic names) Oxycodone IR Oxycodone/APAP Oxymorphone IR Hydromorphone Hydrocodone/APAP Morphine  Where is the problem?  Manufacturing and supply level.  Will this shortage affect you?  Only if you  take any of the above pain medications.  How? You may be unable to fill your prescription.  Your pharmacist may offer a "partial fill" of your prescription. (Warning: Do not accept partial fills.) Prescriptions partially filled cannot be transferred to another pharmacy. Read our Medication Rules and Regulation. Depending on how much medicine you are dependent on, you may experience withdrawals when unable to get the medication.  Recommendations: Consider ending your dependence on opioid pain medications. Ask your pain specialist to assist you with the process. Consider switching to a medication currently not in shortage, such as Buprenorphine. Talk to your pain  specialist about this option. Consider decreasing your pain medication requirements by managing tolerance thru "Drug Holidays". This may help minimize withdrawals, should you run out of medicine. Control your pain thru the use of non-pharmacological interventional therapies.   Your prescriber: Prescribers cannot be blamed for shortages. Medication manufacturing and supply issues cannot be fixed by the prescriber.   NOTE: The prescriber is not responsible for supplying the medication, or solving supply issues. Work with your pharmacist to solve it. The patient is responsible for the decision to take or continue taking the medication and for identifying and securing a legal supply source. By law, supplying the medication is the job and responsibility of the pharmacy. The prescriber is responsible for the evaluation, monitoring, and prescribing of these medications.   Prescribers will NOT: Re-issue prescriptions that have been partially filled. Re-issue prescriptions already sent to a pharmacy.  Re-send prescriptions to a different pharmacy because yours did not have your medication. Ask pharmacist to order more medicine or transfer the prescription to another pharmacy. (Read below.)  New 2023 regulation: "April 03, 2022 Revised Regulation Allows DEA-Registered Pharmacies to Transfer Electronic Prescriptions at a Patient's Request DEA Headquarters Division - Public Information Office Patients now have the ability to request their electronic prescription be transferred to another pharmacy without having to go back to their practitioner to initiate the request. This revised regulation went into effect on Monday, March 30, 2022.     At a patient's request, a DEA-registered retail pharmacy can now transfer an electronic prescription for a controlled substance (schedules II-V) to another DEA-registered retail pharmacy. Prior to this change, patients would have to go through their practitioner to  cancel their prescription and have it re-issued to a different pharmacy. The process was taxing and time consuming for both patients and practitioners.    The Drug Enforcement Administration [facility]) published its intent to revise the process for transferring electronic prescriptions on June 21, 2020.  The final rule was published in the federal register on February 26, 2022 and went into effect 30 days later.  Under the final rule, a prescription can only be transferred once between pharmacies, and only if allowed under existing state or other applicable law. The prescription must remain in its electronic form; may not be altered in any way; and the transfer must be communicated directly between two licensed pharmacists. It's important to note, any authorized refills transfer with the original prescription, which means the entire prescription will be filled at the same pharmacy".  Reference: [email or web address].is [facility] website announcement)  [email or web address] [employer] of Justice)   [employer] / Vol. 88, No. 143 / Thursday, February 26, 2022 / Rules and Regulations DEPARTMENT OF JUSTICE  Drug Enforcement Administration  21 CFR Part 1306  [Docket No. [id number]]  RIN [id number] Transfer of Electronic Prescriptions for Schedules II-V Controlled Substances Between Pharmacies for Initial Filling  ____________________________________________________________________________________________  ____________________________________________________________________________________________  Transfer of Pain Medication between Pharmacies  Re: 2023 DEA Clarification on existing regulation  Published on DEA Website: April 03, 2022  Title: Revised Regulation Allows DEA-Registered Pharmacies to Electrical engineer Prescriptions at a Patient's  Request DEA Headquarters Division - [employer]  "Patients now have the ability to request their electronic prescription be transferred to another pharmacy without having to go back to their practitioner to initiate the request. This revised regulation went into effect on Monday, March 30, 2022.     At a patient's request, a DEA-registered retail pharmacy can now transfer an electronic prescription for a controlled substance (schedules II-V) to another DEA-registered retail pharmacy. Prior to this change, patients would have to go through their practitioner to cancel their prescription and have it re-issued to a different pharmacy. The process was taxing and time consuming for both patients and practitioners.    The Drug Enforcement Administration [facility]) published its intent to revise the process for transferring electronic prescriptions on June 21, 2020.  The final rule was published in the federal register on February 26, 2022 and went into effect 30 days later.  Under the final rule, a prescription can only be transferred once between pharmacies, and only if allowed under existing state or other applicable law. The prescription must remain in its electronic form; may not be altered in any way; and the transfer must be communicated directly between two licensed pharmacists. It's important to note, any authorized refills transfer with the original prescription, which means the entire prescription will be filled at the same pharmacy."    REFERENCES: 1. DEA website announcement [email or web address]  2. Department of Justice website  [email or web address]  3. DEPARTMENT OF JUSTICE Drug Enforcement Administration 21 CFR Part 1306 [Docket No. [id number]] RIN [id number] "Transfer of Electronic Prescriptions for Schedules II-V Controlled Substances  Between Pharmacies for Initial Filling"  ____________________________________________________________________________________________     _______________________________________________________________________  Medication Rules  Purpose: To inform patients, and their family members, of our medication rules and regulations.  Applies to: All patients receiving prescriptions from our practice (written or electronic).  Pharmacy of record: This is the pharmacy where your electronic prescriptions will be sent. Make sure we have the correct one.  Electronic prescriptions: In compliance with the [facility] Strengthen Opioid Misuse Prevention (STOP) Act of 2017 (Session Conni Elliot [phone]), effective August 03, 2018, all controlled substances must be electronically prescribed. Written prescriptions, faxing, or calling prescriptions to a pharmacy will no longer be done.  Prescription refills: These will be provided only during in-person appointments. No medications will be renewed without a "face-to-face" evaluation with your provider. Applies to all prescriptions.  NOTE: The following applies primarily to controlled substances (Opioid* Pain Medications).   Type of encounter (visit): For patients receiving controlled substances, face-to-face visits are required. (Not an option and not up to the patient.)  Patient's responsibilities: Pain Pills: Bring all pain pills to every appointment (except for procedure appointments). Pill Bottles: Bring pills in original pharmacy bottle. Bring bottle, even if empty. Always bring the bottle of the most recent fill.  Medication refills: You are responsible for knowing and keeping track of what medications you are taking and when is it that you will need a refill. The day before your appointment: write a list of all prescriptions that need to be refilled. The day of the appointment: give the list to the admitting nurse. Prescriptions will be written only  during appointments. No prescriptions will be written on procedure days. If you forget a  medication: it will not be "Called in", "Faxed", or "electronically sent". You will need to get another appointment to get these prescribed. No early refills. Do not call asking to have your prescription filled early. Partial  or short prescriptions: Occasionally your pharmacy may not have enough pills to fill your prescription.  NEVER ACCEPT a partial fill or a prescription that is short of the total amount of pills that you were prescribed.  With controlled substances the law allows 72 hours for the pharmacy to complete the prescription.  If the prescription is not completed within 72 hours, the pharmacist will require a new prescription to be written. This means that you will be short on your medicine and we WILL NOT send another prescription to complete your original prescription.  Instead, request the pharmacy to send a carrier to a nearby branch to get enough medication to provide you with your full prescription. Prescription Accuracy: You are responsible for carefully inspecting your prescriptions before leaving our office. Have the discharge nurse carefully go over each prescription with you, before taking them home. Make sure that your name is accurately spelled, that your address is correct. Check the name and dose of your medication to make sure it is accurate. Check the number of pills, and the written instructions to make sure they are clear and accurate. Make sure that you are given enough medication to last until your next medication refill appointment. Taking Medication: Take medication as prescribed. When it comes to controlled substances, taking less pills or less frequently than prescribed is permitted and encouraged. Never take more pills than instructed. Never take the medication more frequently than prescribed.  Inform other Doctors: Always inform, all of your healthcare providers, of all the  medications you take. Pain Medication from other Providers: You are not allowed to accept any additional pain medication from any other Doctor or Healthcare provider. There are two exceptions to this rule. (see below) In the event that you require additional pain medication, you are responsible for notifying us, as stated below. Cough Medicine: Often these contain an opioid, such as codeine or hydrocodone. Never accept or take cough medicine containing these opioids if you are already taking an opioid* medication. The combination may cause respiratory failure and death. Medication Agreement: You are responsible for carefully reading and following our Medication Agreement. This must be signed before receiving any prescriptions from our practice. Safely store a copy of your signed Agreement. Violations to the Agreement will result in no further prescriptions. (Additional copies of our Medication Agreement are available upon request.) Laws, Rules, & Regulations: All patients are expected to follow all [address] and [employer], [employer], Rules, [place] [employer]. Ignorance of the Laws does not constitute a valid excuse.  Illegal drugs and Controlled Substances: The use of illegal substances (including, but not limited to marijuana and its derivatives) and/or the illegal use of any controlled substances is strictly prohibited. Violation of this rule may result in the immediate and permanent discontinuation of any and all prescriptions being written by our practice. The use of any illegal substances is prohibited. Adopted CDC guidelines & recommendations: Target dosing levels will be at or below 60 MME/day. Use of benzodiazepines** is not recommended.  Exceptions: There are only two exceptions to the rule of not receiving pain medications from other Healthcare Providers. Exception #1 (Emergencies): In the event of an emergency (i.e.: accident requiring emergency care), you are allowed to receive additional pain  medication. However, you are responsible for: As soon as  you are able, call our office [phone], at any time of the day or night, and leave a message stating your name, the date and nature of the emergency, and the name and dose of the medication prescribed. In the event that your call is answered by a member of our staff, make sure to document and save the date, time, and the name of the person that took your information.  Exception #2 (Planned Surgery): In the event that you are scheduled by another doctor or dentist to have any type of surgery or procedure, you are allowed (for a period no longer than 30 days), to receive additional pain medication, for the acute post-op pain. However, in this case, you are responsible for picking up a copy of our "Post-op Pain Management for Surgeons" handout, and giving it to your surgeon or dentist. This document is available at our office, and does not require an appointment to obtain it. Simply go to our office during business hours (Monday-Thursday from 8:00 AM to 4:00 PM) (Friday 8:00 AM to 12:00 Noon) or if you have a scheduled appointment with [place], prior to your surgery, and ask for it by name. In addition, you are responsible for: calling our office [phone], at any time of the day or night, and leaving a message stating your name, name of your surgeon, type of surgery, and date of procedure or surgery. Failure to comply with your responsibilities may result in termination of therapy involving the controlled substances. Medication Agreement Violation. Following the above rules, including your responsibilities will help you in avoiding a Medication Agreement Violation ("Breaking your Pain Medication Contract").  Consequences:  Not following the above rules may result in permanent discontinuation of medication prescription therapy.  *Opioid medications include: morphine, codeine, oxycodone, oxymorphone, hydrocodone, hydromorphone, meperidine, tramadol,  tapentadol, buprenorphine, fentanyl, methadone. **Benzodiazepine medications include: diazepam (Valium), alprazolam (Xanax), clonazepam (Klonopine), lorazepam (Ativan), clorazepate (Tranxene), chlordiazepoxide (Librium), estazolam (Prosom), oxazepam (Serax), temazepam (Restoril), triazolam (Halcion) (Last updated: 05/26/2022) ______________________________________________________________________    ______________________________________________________________________  Medication Recommendations and Reminders  Applies to: All patients receiving prescriptions (written and/or electronic).  Medication Rules & Regulations: You are responsible for reading, knowing, and following our "Medication Rules" document. These exist for your safety and that of others. They are not flexible and neither are we. Dismissing or ignoring them is an act of "non-compliance" that may result in complete and irreversible termination of such medication therapy. For safety reasons, "non-compliance" will not be tolerated. As with the [place] fundamental legal principle of "ignorance of the law is no defense", we will accept no excuses for not having read and knowing the content of documents provided to you by our practice.  Pharmacy of record:  Definition: This is the pharmacy where your electronic prescriptions will be sent.  We do not endorse any particular pharmacy. It is up to you and your insurance to decide what pharmacy to use.  We do not restrict you in your choice of pharmacy. However, once we write for your prescriptions, we will NOT be re-sending more prescriptions to fix restricted supply problems created by your pharmacy, or your insurance.  The pharmacy listed in the electronic medical record should be the one where you want electronic prescriptions to be sent. If you choose to change pharmacy, simply notify our nursing staff. Changes will be made only during your regular appointments and not over the  phone.  Recommendations: Keep all of your pain medications in a safe place, under lock and key, even  if you live alone. We will NOT replace lost, stolen, or damaged medication. We do not accept "Police Reports" as proof of medications having been stolen. After you fill your prescription, take 1 week's worth of pills and put them away in a safe place. You should keep a separate, properly labeled bottle for this purpose. The remainder should be kept in the original bottle. Use this as your primary supply, until it runs out. Once it's gone, then you know that you have 1 week's worth of medicine, and it is time to come in for a prescription refill. If you do this correctly, it is unlikely that you will ever run out of medicine. To make sure that the above recommendation works, it is very important that you make sure your medication refill appointments are scheduled at least 1 week before you run out of medicine. To do this in an effective manner, make sure that you do not leave the office without scheduling your next medication management appointment. Always ask the nursing staff to show you in your prescription , when your medication will be running out. Then arrange for the receptionist to get you a return appointment, at least 7 days before you run out of medicine. Do not wait until you have 1 or 2 pills left, to come in. This is very poor planning and does not take into consideration that we may need to cancel appointments due to bad weather, sickness, or emergencies affecting our staff. DO NOT ACCEPT A "Partial Fill": If for any reason your pharmacy does not have enough pills/tablets to completely fill or refill your prescription, do not allow for a "partial fill". The law allows the pharmacy to complete that prescription within 72 hours, without requiring a new prescription. If they do not fill the rest of your prescription within those 72 hours, you will need a separate prescription to fill the remaining  amount, which we will NOT provide. If the reason for the partial fill is your insurance, you will need to talk to the pharmacist about payment alternatives for the remaining tablets, but again, DO NOT ACCEPT A PARTIAL FILL, unless you can trust your pharmacist to obtain the remainder of the pills within 72 hours.  Prescription refills and/or changes in medication(s):  Prescription refills, and/or changes in dose or medication, will be conducted only during scheduled medication management appointments. (Applies to both, written and electronic prescriptions.) No refills on procedure days. No medication will be changed or started on procedure days. No changes, adjustments, and/or refills will be conducted on a procedure day. Doing so will interfere with the diagnostic portion of the procedure. No phone refills. No medications will be "called into the pharmacy". No Fax refills. No weekend refills. No Holliday refills. No after hours refills.  Remember:  Business hours are:  Monday to Thursday 8:00 AM to 4:00 PM Provider's Schedule: Delano Metz, MD - Appointments are:  Medication management: Monday and Wednesday 8:00 AM to 4:00 PM Procedure day: Tuesday and Thursday 7:30 AM to 4:00 PM Edward Jolly, MD - Appointments are:  Medication management: Tuesday and Thursday 8:00 AM to 4:00 PM Procedure day: Monday and Wednesday 7:30 AM to 4:00 PM (Last update: 05/26/2022) ______________________________________________________________________   ____________________________________________________________________________________________  Naloxone Nasal Spray  Why am I receiving this medication? [place] STOP ACT requires that all patients taking high dose opioids or at risk of opioids respiratory depression, be prescribed an opioid reversal agent, such as Naloxone (AKA: Narcan).  What is this medication? NALOXONE (  nal OX one) treats opioid overdose, which causes slow or shallow breathing,  severe drowsiness, or trouble staying awake. Call emergency services after using this medication. You may need additional treatment. Naloxone works by reversing the effects of opioids. It belongs to a group of medications called opioid blockers.  COMMON BRAND NAME(S): Kloxxado, Narcan  What should I tell my care team before I take this medication? They need to know if you have any of these conditions: Heart disease Substance use disorder An unusual or allergic reaction to naloxone, other medications, foods, dyes, or preservatives Pregnant or trying to get pregnant Breast-feeding  When to use this medication? This medication is to be used for the treatment of respiratory depression (less than 8 breaths per minute) secondary to opioid overdose.   How to use this medication? This medication is for use in the nose. Lay the person on their back. Support their neck with your hand and allow the head to tilt back before giving the medication. The nasal spray should be given into 1 nostril. After giving the medication, move the person onto their side. Do not remove or test the nasal spray until ready to use. Get emergency medical help right away after giving the first dose of this medication, even if the person wakes up. You should be familiar with how to recognize the signs and symptoms of a narcotic overdose. If more doses are needed, give the additional dose in the other nostril. Talk to your care team about the use of this medication in children. While this medication may be prescribed for children as young as newborns for selected conditions, precautions do apply.  Naloxone Overdosage: If you think you have taken too much of this medicine contact a poison control center or emergency room at once.  NOTE: This medicine is only for you. Do not share this medicine with others.  What if I miss a dose? This does not apply.  What may interact with this medication? This is only used during an  emergency. No interactions are expected during emergency use. This list may not describe all possible interactions. Give your health care provider a list of all the medicines, herbs, non-prescription drugs, or dietary supplements you use. Also tell them if you smoke, drink alcohol, or use illegal drugs. Some items may interact with your medicine.  What should I watch for while using this medication? Keep this medication ready for use in the case of an opioid overdose. Make sure that you have the phone number of your care team and local hospital ready. You may need to have additional doses of this medication. Each nasal spray contains a single dose. Some emergencies may require additional doses. After use, bring the treated person to the nearest hospital or call 911. Make sure the treating care team knows that the person has received a dose of this medication. You will receive additional instructions on what to do during and after use of this medication before an emergency occurs.  What side effects may I notice from receiving this medication? Side effects that you should report to your care team as soon as possible: Allergic reactions--skin rash, itching, hives, swelling of the face, lips, tongue, or throat Side effects that usually do not require medical attention (report these to your care team if they continue or are bothersome): Constipation Dryness or irritation inside the nose Headache Increase in blood pressure Muscle spasms Stuffy nose Toothache This list may not describe all possible side effects. Call your doctor for  medical advice about side effects. You may report side effects to FDA at 1-800-FDA-1088.  Where should I keep my medication? Because this is an emergency medication, you should keep it with you at all times.  Keep out of the reach of children and pets. Store between 20 and 25 degrees C (68 and 77 degrees F). Do not freeze. Throw away any unused medication after the  expiration date. Keep in original box until ready to use.  NOTE: This sheet is a summary. It may not cover all possible information. If you have questions about this medicine, talk to your doctor, pharmacist, or health care provider.   2023 Elsevier/Gold Standard (2021-03-28 00:00:00)  ____________________________________________________________________________________________

## 2023-05-24 NOTE — Progress Notes (Signed)
Nursing Pain Medication Assessment:  Safety precautions to be maintained throughout the outpatient stay will include: orient to surroundings, keep bed in low position, maintain call bell within reach at all times, provide assistance with transfer out of bed and ambulation.  Medication Inspection Compliance: Pill count conducted under aseptic conditions, in front of the patient. Neither the pills nor the bottle was removed from the patient's sight at any time. Once count was completed pills were immediately returned to the patient in their original bottle.  Medication: Oxycodone IR Pill/Patch Count:  24 of 120 pills remain Pill/Patch Appearance: Markings consistent with prescribed medication Bottle Appearance: Standard pharmacy container. Clearly labeled. Filled Date: 27 / 2 / 2024 Last Medication intake:  TodaySafety precautions to be maintained throughout the outpatient stay will include: orient to surroundings, keep bed in low position, maintain call bell within reach at all times, provide assistance with transfer out of bed and ambulation.

## 2023-06-08 ENCOUNTER — Other Ambulatory Visit: Payer: Self-pay

## 2023-06-08 DIAGNOSIS — I2724 Chronic thromboembolic pulmonary hypertension: Secondary | ICD-10-CM

## 2023-06-08 NOTE — Progress Notes (Signed)
Aron Baba sent to Lars Mage, RN Hey there!! Can you please add an order for a chest xray to go with the nm pulm test that you want? Please and thank you!  Per OV notes by Excell Seltzer on 10/19/22: Personally reviewed his most recent echo images. He has dilated right heart and a D-shaped septum. He has undergone evaluation in the advanced heart failure clinic. While he does not appear to have an obvious functional limitation, further testing is indicated. I went back and reviewed the recommendations from the heart failure clinic with him. He has not followed through on some of the testing he was supposed to undergo. He will undergo home sleep study and he has this with him at home. Instructions were reviewed with him again today. He will have a VQ scan to evaluate for chronic thromboembolic disease. He will also have a right heart catheterization to define the degree of pulmonary hypertension, assess for intracardiac shunting, and provide full hemodynamic evaluation. I reviewed risks, indications, and alternatives to right heart catheterization with the patient. He understands and agrees to proceed.   Order placed for CXR at this time.

## 2023-08-24 NOTE — Progress Notes (Unsigned)
PROVIDER NOTE: Information contained herein reflects review and annotations entered in association with encounter. Interpretation of such information and data should be left to medically-trained personnel. Information provided to patient can be located elsewhere in the medical record under "Patient Instructions". Document created using STT-dictation technology, any transcriptional errors that may result from process are unintentional.    Patient: [name] [name]  Service Category: E/M  Provider: Oswaldo Done, MD  DOB: [birth year]  DOS: 08/25/2023  Referring Provider: Gordan Payment., MD  MRN: [id number]  Specialty: Interventional Pain Management  PCP: Gordan Payment., MD  Type: Established Patient  Setting: Ambulatory outpatient    Location: Office  Delivery: Face-to-face     HPI  [name] [name], a 73 y.o. year old male, is here today because of his No primary diagnosis found.. [name] [name] primary complain today is No chief complaint on file.  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); Lumbar [name] arthropathy (HCC); Chronic lower extremity pain (1ry area of Pain) (Bilateral) (L>R); Chronic lumbar radicular pain (Left) (S1 dermatome); Chronic cervical radicular pain (Right); Chronic neck pain (3ry area of Pain) (Bilateral) (L>R); Neurogenic pain; DDD (degenerative disc disease), lumbosacral; Gout; Injury, shoulder and upper arm; Chronic upper extremity radicular pain (Left); DDD (degenerative disc disease), cervical; History of gout; Acute pain of right knee; Cervical spinal stenosis (degenerative cord compression C4-5); Cervical foraminal stenosis (Bilateral) (C3-4, C4-5); Cervicalgia; Shoulder radicular pain and numbness  (Right); Trigger finger of middle finger (Right); Trigger finger of ring finger (Right); Chronic upper extremity radicular pain (Right); Abnormal MRI, cervical spine; Myelomalacia of cervical cord (HCC); Chronic upper extremity pain (4th area of Pain) (Bilateral); and Acute neck pain on their pertinent problem list. Pain Assessment: Severity of   is reported as a  /10. Location:    / . Onset:  . Quality:  . Timing:  . Modifying factor(s):  Marland Kitchen Vitals:  vitals were not taken for this visit.  BMI: Estimated body mass index is 26.3 kg/m as calculated from the following:   Height as of 05/24/23: 5\' 8"  (1.727 m).   Weight as of 05/24/23: 173 lb (78.5 kg). Last encounter: 05/24/2023. Last procedure: Visit date not found.  Reason for encounter: medication management. ***  Discussed the use of AI scribe software for clinical note transcription with the patient, who gave verbal consent to proceed.  History of Present Illness         RTCB: 11/28/2023   Pharmacotherapy Assessment  Analgesic: Oxycodone IR 10 mg, 1 tab PO QID (40 mg/day of oxycodone) MME/day: 60 mg/day.   Monitoring: [place] PMP: PDMP reviewed during this encounter.       Pharmacotherapy: No side-effects or adverse reactions reported. Compliance: No problems identified. Effectiveness: Clinically acceptable.  No notes on file  No results found for: "CBDTHCR" No results found for: "D8THCCBX" No results found for: "D9THCCBX"  UDS:  Summary  Date Value Ref Range Status  12/09/2022 Note  Final    Comment:    ==================================================================== [employer] [facility] [employer] (MW) ==================================================================== Test                             Result       Flag  Units  Drug Present and Declared for Prescription Verification   Oxycodone                      2391         EXPECTED   ng/mg creat   Oxymorphone                    5528         EXPECTED   ng/mg creat    Noroxycodone                   4627         EXPECTED   ng/mg creat   Noroxymorphone                 2192         EXPECTED   ng/mg creat    Sources of oxycodone are scheduled prescription medications.    Oxymorphone, noroxycodone, and noroxymorphone are expected    metabolites of oxycodone. Oxymorphone is also available as a    scheduled prescription medication.  ==================================================================== Test                      Result    Flag   Units      Ref Range   Creatinine              144              mg/dL      >=95 ==================================================================== Declared Medications:  The flagging and interpretation on this report are based on the  following declared medications.  Unexpected results may arise from  inaccuracies in the declared medications.   **Note: The testing scope of this panel includes these medications:   Oxycodone   **Note: The testing scope of this panel does not include the  following reported medications:   Aspirin  Atorvastatin (Lipitor)  Clopidogrel (Plavix)  Hydrochlorothiazide  Isosorbide (Imdur)  Magnesium (Mag-Ox)  Metoprolol (Toprol)  Naloxone (Narcan)  Nitroglycerin (Nitrostat)  Pantoprazole (Protonix)  Spironolactone (Aldactone)  Vitamin B12  Vitamin D3 ==================================================================== For clinical consultation, please call [phone]. ====================================================================       ROS  Constitutional: Denies any fever or chills Gastrointestinal: No reported hemesis, hematochezia, vomiting, or acute GI distress Musculoskeletal: Denies any acute onset joint swelling, redness, loss of ROM, or weakness Neurological: No reported episodes of acute onset apraxia, aphasia, dysarthria, agnosia, amnesia, paralysis, loss of coordination, or loss of consciousness  Medication Review  Oxycodone HCl, Vitamin D3, aspirin,  atorvastatin, clopidogrel, cyanocobalamin, hydrochlorothiazide, isosorbide dinitrate, isosorbide mononitrate, magnesium oxide, metoprolol succinate, naloxone, nitroGLYCERIN, pantoprazole, and spironolactone  History Review  Allergy: Mr. Brashers is allergic to gabapentin. Drug: Mr. Lainhart  reports no history of drug use. Alcohol:  reports no history of alcohol use. Tobacco:  reports that he has quit smoking. He has never used smokeless tobacco. Social: Mr. Hering  reports that he has quit smoking. He has never used smokeless tobacco. He reports that he does not drink alcohol and does not use drugs. Medical:  has a past medical history of CAD (coronary artery disease), Chronic pain syndrome, GERD (gastroesophageal reflux disease), Hyperlipidemia, Hypertension, and Radiculitis of right cervical region (09/04/2015). Surgical: Mr. Mastandrea  has a past surgical history that includes PCI 2009 with stenting of LIMA-LAD anastamosis; Coronary artery bypass graft; Hernia repair; Cholecystectomy; left heart catheterization with coronary/graft angiogram (N/A, 04/21/2013); and RIGHT HEART CATH (N/A, 11/18/2022). Family: family  history includes Multiple sclerosis in his mother; Stroke in his father.  Laboratory Chemistry Profile   Renal Lab Results  Component Value Date   BUN 11 11/18/2022   CREATININE 0.70 11/18/2022   BCR 14 07/17/2020   GFR 108.38 12/07/2013   GFRAA 92 07/17/2020   GFRNONAA >60 06/13/2021    Hepatic Lab Results  Component Value Date   AST 43 (H) 07/17/2020   ALT 34 07/17/2020   ALBUMIN 3.8 07/17/2020   ALKPHOS 89 07/17/2020    Electrolytes Lab Results  Component Value Date   NA 140 11/18/2022   NA 140 11/18/2022   K 3.2 (L) 11/18/2022   K 3.2 (L) 11/18/2022   CL 97 (L) 11/18/2022   CALCIUM 9.2 06/13/2021   MG 1.9 03/12/2016    Bone Lab Results  Component Value Date   25OHVITD1 21 (L) 03/12/2016   25OHVITD2 2.2 03/12/2016   25OHVITD3 19 03/12/2016    Inflammation (CRP: Acute  Phase) (ESR: Chronic Phase) Lab Results  Component Value Date   CRP 0.6 03/12/2016   ESRSEDRATE 21 (H) 03/12/2016         Note: Above Lab results reviewed.  Recent Imaging Review  CARDIAC CATHETERIZATION   LV end diastolic pressure is normal.  Mild pulmonary HTN, PVR 2 Woods units, otherwise unremarkable right heart  catheterization  RA pressure mean 7 RV pressure 35/1 with an RVEDP of 8 PA pressure 35/10 mean 21 Wedge pressure of 12  Transpulmonary gradient 9 mmHg, cardiac output 4.5 L/min, PVR 2 Wood units Note: Reviewed        Physical Exam  General appearance: Well nourished, well developed, and well hydrated. In no apparent acute distress Mental status: Alert, oriented x 3 (person, place, & time)       Respiratory: No evidence of acute respiratory distress Eyes: PERLA Vitals: There were no vitals taken for this visit. BMI: Estimated body mass index is 26.3 kg/m as calculated from the following:   Height as of 05/24/23: 5\' 8"  (1.727 m).   Weight as of 05/24/23: 173 lb (78.5 kg). Ideal: Patient weight not recorded  Assessment   Diagnosis Status  1. Chronic lower extremity pain (1ry area of Pain) (Bilateral) (L>R)   2. 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 Chronic neck pain (3ry area of Pain) (Bilateral) (L>R)   4. Chronic upper extremity pain (4th area of Pain) (Bilateral)   5. DDD (degenerative disc disease), cervical   6. 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 Chronic pain syndrome   8. Pharmacologic therapy   9. Chronic use of opiate for therapeutic purpose   10. Encounter for medication management   11. Encounter for chronic pain management    Controlled Controlled Controlled   Updated Problems: No problems updated.  Plan of Care  Problem-specific:  Assessment and Plan            Mr. BRODI TOREN has a current medication list which includes the following long-term medication(s): atorvastatin,  vitamin d3, isosorbide mononitrate, magnesium oxide, metoprolol succinate, nitroglycerin, oxycodone hcl, pantoprazole, and spironolactone.  Pharmacotherapy (Medications Ordered): No orders of the defined types were placed in this encounter.  Orders:  No orders of the defined types were placed in this encounter.  Follow-up plan:   No follow-ups on file.      Interventional Therapies  Risk  Complexity Considerations:   NOTE: PLAVIX ANTICOAGULATION (Stop: 7-10 days  Restart: 2 hrs) According  to the patient he is unable to stop anticoagulation due to high risk of vascular occlusion. Heart disease; CAD; s/p CABG; hypercholesterolemia; hyperlipidemia Prediabetes   Planned  Pending:      Under consideration:   None due to patient being unable to stop anticoagulation.   Completed:   None   Therapeutic  Palliative (PRN) options:   None   Pharmacotherapy  Nonopioids transferred 06/26/2020: Gabapentin, magnesium, and vitamin D        Recent Visits No visits were found meeting these conditions. Showing recent visits within past 90 days and meeting all other requirements Future Appointments Date Type Provider Dept  08/25/23 Appointment Delano Metz, MD [facility]  Showing future appointments within next 90 days and meeting all other requirements  I discussed the assessment and treatment plan with the patient. The patient was provided an opportunity to ask questions and all were answered. The patient agreed with the plan and demonstrated an understanding of the instructions.  Patient advised to call back or seek an in-person evaluation if the symptoms or condition worsens.  Duration of encounter: *** minutes.  Total time on encounter, as per AMA guidelines included both the face-to-face and non-face-to-face time personally spent by the physician and/or other qualified health care professional(s) on the day of the encounter (includes time in activities that  require the physician or other qualified health care professional and does not include time in activities normally performed by clinical staff). Physician's time may include the following activities when performed: Preparing to see the patient (e.g., pre-charting review of records, searching for previously ordered imaging, lab work, and nerve conduction tests) Review of prior analgesic pharmacotherapies. Reviewing PMP Interpreting ordered tests (e.g., lab work, imaging, nerve conduction tests) Performing post-procedure evaluations, including interpretation of diagnostic procedures Obtaining and/or reviewing separately obtained history Performing a medically appropriate examination and/or evaluation Counseling and educating the patient/family/caregiver Ordering medications, tests, or procedures Referring and communicating with other health care professionals (when not separately reported) Documenting clinical information in the electronic or other health record Independently interpreting results (not separately reported) and communicating results to the patient/ family/caregiver Care coordination (not separately reported)  Note by: Oswaldo Done, MD Date: 08/25/2023; Time: 6:37 AM

## 2023-08-24 NOTE — Patient Instructions (Signed)

## 2023-08-25 ENCOUNTER — Ambulatory Visit (HOSPITAL_BASED_OUTPATIENT_CLINIC_OR_DEPARTMENT_OTHER): Payer: Medicare Other | Admitting: Pain Medicine

## 2023-08-25 DIAGNOSIS — Z91199 Patient's noncompliance with other medical treatment and regimen due to unspecified reason: Secondary | ICD-10-CM

## 2023-08-25 DIAGNOSIS — G894 Chronic pain syndrome: Secondary | ICD-10-CM

## 2023-08-25 DIAGNOSIS — M47816 Spondylosis without myelopathy or radiculopathy, lumbar region: Secondary | ICD-10-CM

## 2023-08-25 DIAGNOSIS — Z79891 Long term (current) use of opiate analgesic: Secondary | ICD-10-CM

## 2023-08-25 DIAGNOSIS — M5030 Other cervical disc degeneration, unspecified cervical region: Secondary | ICD-10-CM

## 2023-08-25 DIAGNOSIS — Z79899 Other long term (current) drug therapy: Secondary | ICD-10-CM

## 2023-08-25 DIAGNOSIS — G8929 Other chronic pain: Secondary | ICD-10-CM
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
## 2023-09-06 ENCOUNTER — Ambulatory Visit: Payer: Medicare Other | Attending: Pain Medicine | Admitting: Pain Medicine

## 2023-09-06 ENCOUNTER — Encounter: Payer: Self-pay | Admitting: Pain Medicine

## 2023-09-06 VITALS — BP 123/87 | HR 73 | Temp 98.2°F | Ht 68.0 in | Wt 151.0 lb

## 2023-09-06 DIAGNOSIS — M79601 Pain in right arm: Secondary | ICD-10-CM | POA: Insufficient documentation

## 2023-09-06 DIAGNOSIS — M5030 Other cervical disc degeneration, unspecified cervical region: Secondary | ICD-10-CM | POA: Insufficient documentation

## 2023-09-06 DIAGNOSIS — M47816 Spondylosis without myelopathy or radiculopathy, lumbar region: Secondary | ICD-10-CM | POA: Diagnosis present

## 2023-09-06 DIAGNOSIS — M5442 Lumbago with sciatica, left side: Secondary | ICD-10-CM | POA: Diagnosis present

## 2023-09-06 DIAGNOSIS — M79605 Pain in left leg: Secondary | ICD-10-CM | POA: Diagnosis present

## 2023-09-06 DIAGNOSIS — M79602 Pain in left arm: Secondary | ICD-10-CM | POA: Diagnosis present

## 2023-09-06 DIAGNOSIS — M79604 Pain in right leg: Secondary | ICD-10-CM

## 2023-09-06 DIAGNOSIS — G8929 Other chronic pain: Secondary | ICD-10-CM | POA: Diagnosis present

## 2023-09-06 DIAGNOSIS — G894 Chronic pain syndrome: Secondary | ICD-10-CM | POA: Diagnosis present

## 2023-09-06 DIAGNOSIS — Z79891 Long term (current) use of opiate analgesic: Secondary | ICD-10-CM | POA: Insufficient documentation

## 2023-09-06 DIAGNOSIS — Z79899 Other long term (current) drug therapy: Secondary | ICD-10-CM | POA: Diagnosis present

## 2023-09-06 DIAGNOSIS — M542 Cervicalgia: Secondary | ICD-10-CM | POA: Diagnosis present

## 2023-09-06 MED ORDER — OXYCODONE HCL 10 MG PO TABS: 10.0000 mg | ORAL_TABLET | Freq: Four times a day (QID) | ORAL | 0 refills | Status: DC | PRN

## 2023-09-06 NOTE — Patient Instructions (Addendum)
Pain Management Discharge Instructions  General Discharge Instructions :  If you need to reach your doctor call: Monday-Friday 8:00 am - 4:00 pm at [phone] or toll free [phone].  After clinic hours [phone] to have operator reach doctor.  Bring all of your medication bottles to all your appointments in the pain clinic.  To cancel or reschedule your appointment with Pain Management please remember to call 24 hours in advance to avoid a fee.  Refer to the educational materials which you have been given on: General Risks, I had my Procedure. Discharge Instructions, Post Sedation.  Post Procedure Instructions:  The drugs you were given will stay in your system until tomorrow, so for the next 24 hours you should not drive, make any legal decisions or drink any alcoholic beverages.  You may eat anything you prefer, but it is better to start with liquids then soups and crackers, and gradually work up to solid foods.  Please notify your doctor immediately if you have any unusual bleeding, trouble breathing or pain that is not related to your normal pain.  Depending on the type of procedure that was done, some parts of your body may feel week and/or numb.  This usually clears up by tonight or the next day.  Walk with the use of an assistive device or accompanied by an adult for the 24 hours.  You may use ice on the affected area for the first 24 hours.  Put ice in a Ziploc bag and cover with a towel and place against area 15 minutes on 15 minutes off.  You may switch to heat after 24 hours. ______________________________________________________________________    Opioid Pain Medication Update  To: All patients taking opioid pain medications. (I.e.: hydrocodone, hydromorphone, oxycodone, oxymorphone, morphine, codeine, methadone, tapentadol, tramadol, buprenorphine, fentanyl, etc.)  Re: Updated review of side effects and adverse reactions of opioid analgesics, as well as new  information about long term effects of this class of medications.  Direct risks of long-term opioid therapy are not limited to opioid addiction and overdose. Potential medical risks include serious fractures, breathing problems during sleep, hyperalgesia, immunosuppression, chronic constipation, bowel obstruction, myocardial infarction, and tooth decay secondary to xerostomia.  Unpredictable adverse effects that can occur even if you take your medication correctly: Cognitive impairment, respiratory depression, and death. Most people think that if they take their medication "correctly", and "as instructed", that they will be safe. Nothing could be farther from the truth. In reality, a significant amount of recorded deaths associated with the use of opioids has occurred in individuals that had taken the medication for a long time, and were taking their medication correctly. The following are examples of how this can happen: Patient taking his/her medication for a long time, as instructed, without any side effects, is given a certain antibiotic or another unrelated medication, which in turn triggers a "Drug-to-drug interaction" leading to disorientation, cognitive impairment, impaired reflexes, respiratory depression or an untoward event leading to serious bodily harm or injury, including death.  Patient taking his/her medication for a long time, as instructed, without any side effects, develops an acute impairment of liver and/or kidney function. This will lead to a rapid inability of the body to breakdown and eliminate their pain medication, which will result in effects similar to an "overdose", but with the same medicine and dose that they had always taken. This again may lead to disorientation, cognitive impairment, impaired reflexes, respiratory depression or an untoward event leading to serious bodily harm or injury, including  death.  A similar problem will occur with patients as they grow older and their  liver and kidney function begins to decrease as part of the aging process.  Background information: Historically, the original case for using long-term opioid therapy to treat chronic noncancer pain was based on safety assumptions that subsequent experience has called into question. In 1996, the American Pain Society and the American Academy of Pain Medicine issued a consensus statement supporting long-term opioid therapy. This statement acknowledged the dangers of opioid prescribing but concluded that the risk for addiction was low; respiratory depression induced by opioids was short-lived, occurred mainly in opioid-naive patients, and was antagonized by pain; tolerance was not a common problem; and efforts to control diversion should not constrain opioid prescribing. This has now proven to be wrong. Experience regarding the risks for opioid addiction, misuse, and overdose in community practice has failed to support these assumptions.  According to the Centers for Disease Control and Prevention, fatal overdoses involving opioid analgesics have increased sharply over the past decade. Currently, more than 96,700 people die from drug overdoses every year. Opioids are a factor in 7 out of every 10 overdose deaths. Deaths from drug overdose have surpassed motor vehicle accidents as the leading cause of death for individuals between the ages of 66 and 95.  Clinical data suggest that neuroendocrine dysfunction may be very common in both men and women, potentially causing hypogonadism, erectile dysfunction, infertility, decreased libido, osteoporosis, and depression. Recent studies linked higher opioid dose to increased opioid-related mortality. Controlled observational studies reported that long-term opioid therapy may be associated with increased risk for cardiovascular events. Subsequent meta-analysis concluded that the safety of long-term opioid therapy in elderly patients has not been proven.   Side Effects  and adverse reactions: Common side effects: Drowsiness (sedation). Dizziness. Nausea and vomiting. Constipation. Physical dependence -- Dependence often manifests with withdrawal symptoms when opioids are discontinued or decreased. Tolerance -- As you take repeated doses of opioids, you require increased medication to experience the same effect of pain relief. Respiratory depression -- This can occur in healthy people, especially with higher doses. However, people with COPD, asthma or other lung conditions may be even more susceptible to fatal respiratory impairment.  Uncommon side effects: An increased sensitivity to feeling pain and extreme response to pain (hyperalgesia). Chronic use of opioids can lead to this. Delayed gastric emptying (the process by which the contents of your stomach are moved into your small intestine). Muscle rigidity. Immune system and hormonal dysfunction. Quick, involuntary muscle jerks (myoclonus). Arrhythmia. Itchy skin (pruritus). Dry mouth (xerostomia).  Long-term side effects: Chronic constipation. Sleep-disordered breathing (SDB). Increased risk of bone fractures. Hypothalamic-pituitary-adrenal dysregulation. Increased risk of overdose.  RISKS: Respiratory depression and death: Opioids increase the risk of respiratory depression and death.  Drug-to-drug interactions: Opioids are relatively contraindicated in combination with benzodiazepines, sleep inducers, and other central nervous system depressants. Other classes of medications (i.e.: certain antibiotics and even over-the-counter medications) may also trigger or induce respiratory depression in some patients.  Medical conditions: Patients with pre-existing respiratory problems are at higher risk of respiratory failure and/or depression when in combination with opioid analgesics. Opioids are relatively contraindicated in some medical conditions such as central sleep apnea.   Fractures and Falls:   Opioids increase the risk and incidence of falls. This is of particular importance in elderly patients.  Endocrine System:  Long-term administration is associated with endocrine abnormalities (endocrinopathies). (Also known as Opioid-induced Endocrinopathy) Influences on both the hypothalamic-pituitary-adrenal axis?and the hypothalamic-pituitary-gonadal  axis have been demonstrated with consequent hypogonadism and adrenal insufficiency in both sexes. Hypogonadism and decreased levels of dehydroepiandrosterone sulfate have been reported in men and women. Endocrine effects include: Amenorrhoea in women (abnormal absence of menstruation) Reduced libido in both sexes Decreased sexual function Erectile dysfunction in men Hypogonadisms (decreased testicular function with shrinkage of testicles) Infertility Depression and fatigue Loss of muscle mass Anxiety Depression Immune suppression Hyperalgesia Weight gain Anemia Osteoporosis Patients (particularly women of childbearing age) should avoid opioids. There is insufficient evidence to recommend routine monitoring of asymptomatic patients taking opioids in the long-term for hormonal deficiencies.  Immune System: Human studies have demonstrated that opioids have an immunomodulating effect. These effects are mediated via opioid receptors both on immune effector cells and in the central nervous system. Opioids have been demonstrated to have adverse effects on antimicrobial response and anti-tumour surveillance. Buprenorphine has been demonstrated to have no impact on immune function.  Opioid Induced Hyperalgesia: Human studies have demonstrated that prolonged use of opioids can lead to a state of abnormal pain sensitivity, sometimes called opioid induced hyperalgesia (OIH). Opioid induced hyperalgesia is not usually seen in the absence of tolerance to opioid analgesia. Clinically, hyperalgesia may be diagnosed if the patient on long-term  opioid therapy presents with increased pain. This might be qualitatively and anatomically distinct from pain related to disease progression or to breakthrough pain resulting from development of opioid tolerance. Pain associated with hyperalgesia tends to be more diffuse than the pre-existing pain and less defined in quality. Management of opioid induced hyperalgesia requires opioid dose reduction.  Cancer: Chronic opioid therapy has been associated with an increased risk of cancer among noncancer patients with chronic pain. This association was more evident in chronic strong opioid users. Chronic opioid consumption causes significant pathological changes in the small intestine and colon. Epidemiological studies have found that there is a link between opium dependence and initiation of gastrointestinal cancers. Cancer is the second leading cause of death after cardiovascular disease. Chronic use of opioids can cause multiple conditions such as GERD, immunosuppression and renal damage as well as carcinogenic effects, which are associated with the incidence of cancers.   Mortality: Long-term opioid use has been associated with increased mortality among patients with chronic non-cancer pain (CNCP).  Prescription of long-acting opioids for chronic noncancer pain was associated with a significantly increased risk of all-cause mortality, including deaths from causes other than overdose.  Reference: Von Korff M, Kolodny A, Deyo RA, Chou R. Long-term opioid therapy reconsidered. Ann Intern Med. 2011 Sep 6;155(5):325-8. doi: 10.7326/[id number]. PMID: 62130865; PMCID: [id number]. Randon Goldsmith, Hayward RA, Dunn KM, [place] KP. Risk of adverse events in patients prescribed long-term opioids: A cohort study in the [place] [facility] Research Datalink. Eur J Pain. 2019 May;23(5):908-922. doi: 10.1002/ejp.1357. Epub 2019 Jan 31. PMID: 84132440. Colameco S, Coren JS, Ciervo CA.  Continuous opioid treatment for chronic noncancer pain: a time for moderation in prescribing. Postgrad Med. 2009 Jul;121(4):61-6. doi: 10.3810/pgm.2009.07.2032. PMID: 10272536. William Hamburger RN, Alburnett SD, Blazina I, Cristopher Peru, Bougatsos C, Deyo RA. The effectiveness and risks of long-term opioid therapy for chronic pain: a systematic review for a [employer]ways to [employer]. Ann Intern Med. 2015 Feb 17;162(4):276-86. doi: 10.7326/M14-2559. PMID: 64403474. Caryl Bis [facility], Makuc DM. NCHS Data Brief No. 22. [place]: Centers for Disease Control and Prevention; 2009. Sep, Increase in Fatal Poisonings Involving Opioid Analgesics in the [place], 1999-2006. Geraldine Contras, Choi HR, Oh  TK. Long-term opioid use and mortality in patients with chronic non-cancer pain: Ten-year follow-up study in [place] from 2010 through 2019. EClinicalMedicine. 2022 Jul 18;51:101558. doi: 10.1016/j.eclinm.2022.161096. PMID: 04540981; PMCID: [id number]. Huser, W., Schubert, T., Vogelmann, T. et al. All-cause mortality in patients with long-term opioid therapy compared with non-opioid analgesics for chronic non-cancer pain: a database study. BMC Med 18, 162 (2020). http://lester.info/ Rashidian H, Karie Kirks, Malekzadeh R, Haghdoost AA. An Ecological Study of the Association between Opiate Use and Incidence of Cancers. Addict Health. 2016 Fall;8(4):252-260. PMID: 21308657; PMCID: [id number].  Our Goal: Our goal is to control your pain with means other than the use of opioid pain medications.  Our Recommendation: Talk to your physician about coming off of these medications. We can assist you with the tapering down and stopping these medicines. Based on the new information, even if you cannot completely stop the medication, a decrease in the dose may be associated with a lesser risk. Ask for other means of controlling the pain. Decrease  or eliminate those factors that significantly contribute to your pain such as smoking, obesity, and a diet heavily tilted towards "inflammatory" nutrients.  Last Updated: 02/08/2023   ______________________________________________________________________       ______________________________________________________________________    National Pain Medication Shortage  The [place] is experiencing worsening drug shortages. These have had a negative widespread effect on patient care and treatment. Not expected to improve any time soon. Predicted to last past 2029.   Drug shortage list (generic names) Oxycodone IR Oxycodone/APAP Oxymorphone IR Hydromorphone Hydrocodone/APAP Morphine  Where is the problem?  Manufacturing and supply level.  Will this shortage affect you?  Only if you take any of the above pain medications.  How? You may be unable to fill your prescription.  Your pharmacist may offer a "partial fill" of your prescription. (Warning: Do not accept partial fills.) Prescriptions partially filled cannot be transferred to another pharmacy. Read our Medication Rules and Regulation. Depending on how much medicine you are dependent on, you may experience withdrawals when unable to get the medication.  Recommendations: Consider ending your dependence on opioid pain medications. Ask your pain specialist to assist you with the process. Consider switching to a medication currently not in shortage, such as Buprenorphine. Talk to your pain specialist about this option. Consider decreasing your pain medication requirements by managing tolerance thru "Drug Holidays". This may help minimize withdrawals, should you run out of medicine. Control your pain thru the use of non-pharmacological interventional therapies.   Your prescriber: Prescribers cannot be blamed for shortages. Medication manufacturing and supply issues cannot be fixed by the prescriber.   NOTE: The prescriber is not  responsible for supplying the medication, or solving supply issues. Work with your pharmacist to solve it. The patient is responsible for the decision to take or continue taking the medication and for identifying and securing a legal supply source. By law, supplying the medication is the job and responsibility of the pharmacy. The prescriber is responsible for the evaluation, monitoring, and prescribing of these medications.   Prescribers will NOT: Re-issue prescriptions that have been partially filled. Re-issue prescriptions already sent to a pharmacy.  Re-send prescriptions to a different pharmacy because yours did not have your medication. Ask pharmacist to order more medicine or transfer the prescription to another pharmacy. (Read below.)  New 2023 regulation: "April 03, 2022 Revised Regulation Allows DEA-Registered Pharmacies to Transfer Electronic Prescriptions at a Patient's Request DEA Headquarters Division - Public Information Office Patients now have the  ability to request their electronic prescription be transferred to another pharmacy without having to go back to their practitioner to initiate the request. This revised regulation went into effect on Monday, March 30, 2022.     At a patient's request, a DEA-registered retail pharmacy can now transfer an electronic prescription for a controlled substance (schedules II-V) to another DEA-registered retail pharmacy. Prior to this change, patients would have to go through their practitioner to cancel their prescription and have it re-issued to a different pharmacy. The process was taxing and time consuming for both patients and practitioners.    The Drug Enforcement Administration [facility]) published its intent to revise the process for transferring electronic prescriptions on June 21, 2020.  The final rule was published in the federal register on February 26, 2022 and went into effect 30 days later.  Under the final rule, a prescription can  only be transferred once between pharmacies, and only if allowed under existing state or other applicable law. The prescription must remain in its electronic form; may not be altered in any way; and the transfer must be communicated directly between two licensed pharmacists. It's important to note, any authorized refills transfer with the original prescription, which means the entire prescription will be filled at the same pharmacy".  Reference: HugeHand.is [facility] website announcement)  [email or web address] Financial pl[employer] of [employer])   [employer] / Vol. 88, No. 143 / Thursday, February 26, 2022 / Rules and Regulations DEPARTMENT OF JUSTICE  Drug Enforcement Administration  21 CFR Part 1306  [Docket No. [id number]]  RIN [id number] Transfer of Electronic Prescriptions for Schedules II-V Controlled Substances Between Pharmacies for Initial Filling  ______________________________________________________________________       ______________________________________________________________________    Transfer of Pain Medication between Pharmacies  Re: 2023 DEA Clarification on existing regulation  Published on DEA Website: April 03, 2022  Title: Revised Regulation Allows DEA-Registered Pharmacies to Electrical engineer Prescriptions at a Patient's Request DEA Headquarters Division - [employer]  "Patients now have the ability to request their electronic prescription be transferred to another pharmacy without having to go back to their practitioner to initiate the request. This revised regulation went into effect on Monday, March 30, 2022.     At a patient's request, a DEA-registered retail pharmacy can now transfer an electronic prescription for a controlled substance (schedules II-V) to another DEA-registered retail  pharmacy. Prior to this change, patients would have to go through their practitioner to cancel their prescription and have it re-issued to a different pharmacy. The process was taxing and time consuming for both patients and practitioners.    The Drug Enforcement Administration [facility]) published its intent to revise the process for transferring electronic prescriptions on June 21, 2020.  The final rule was published in the federal register on February 26, 2022 and went into effect 30 days later.  Under the final rule, a prescription can only be transferred once between pharmacies, and only if allowed under existing state or other applicable law. The prescription must remain in its electronic form; may not be altered in any way; and the transfer must be communicated directly between two licensed pharmacists. It's important to note, any authorized refills transfer with the original prescription, which means the entire prescription will be filled at the same pharmacy."    REFERENCES: 1. DEA website announcement [email or web address]  2. Department of Justice website  [email or web address]  3. DEPARTMENT OF JUSTICE Drug Enforcement Administration 21 CFR Part 1306 [Docket No. [id number]] RIN [id number] "Transfer of  Electronic Prescriptions for Schedules II-V Controlled Substances Between Pharmacies for Initial Filling"  ______________________________________________________________________       ______________________________________________________________________    Medication Rules  Purpose: To inform patients, and their family members, of our medication rules and regulations.  Applies to: All patients receiving prescriptions from our practice (written or electronic).  Pharmacy of record: This is the pharmacy where your electronic prescriptions will be sent.  Make sure we have the correct one.  Electronic prescriptions: In compliance with the [facility] Strengthen Opioid Misuse Prevention (STOP) Act of 2017 (Session Conni Elliot [phone]), effective August 03, 2018, all controlled substances must be electronically prescribed. Written prescriptions, faxing, or calling prescriptions to a pharmacy will no longer be done.  Prescription refills: These will be provided only during in-person appointments. No medications will be renewed without a "face-to-face" evaluation with your provider. Applies to all prescriptions.  NOTE: The following applies primarily to controlled substances (Opioid* Pain Medications).   Type of encounter (visit): For patients receiving controlled substances, face-to-face visits are required. (Not an option and not up to the patient.)  Patient's Responsibilities: Pain Pills: Bring all pain pills to every appointment (except for procedure appointments). Pill counts are required.  Pill Bottles: Bring pills in original pharmacy bottle. Bring bottle, even if empty. Always bring the bottle of the most recent fill.  Medication refills: You are responsible for knowing and keeping track of what medications you are taking and when is it that you will need a refill. The day before your appointment: write a list of all prescriptions that need to be refilled. The day of the appointment: give the list to the admitting nurse. Prescriptions will be written only during appointments. No prescriptions will be written on procedure days. If you forget a medication: it will not be "Called in", "Faxed", or "electronically sent". You will need to get another appointment to get these prescribed. No early refills. Do not call asking to have your prescription filled early. Partial  or short prescriptions: Occasionally your pharmacy may not have enough pills to fill your prescription.  NEVER ACCEPT a partial fill or a prescription that is short of the total  amount of pills that you were prescribed.  With controlled substances the law allows 72 hours for the pharmacy to complete the prescription.  If the prescription is not completed within 72 hours, the pharmacist will require a new prescription to be written. This means that you will be short on your medicine and we WILL NOT send another prescription to complete your original prescription.  Instead, request the pharmacy to send a carrier to a nearby branch to get enough medication to provide you with your full prescription. Prescription Accuracy: You are responsible for carefully inspecting your prescriptions before leaving our office. Have the discharge nurse carefully go over each prescription with you, before taking them home. Make sure that your name is accurately spelled, that your address is correct. Check the name and dose of your medication to make sure it is accurate. Check the number of pills, and the written instructions to make sure they are clear and accurate. Make sure that you are given enough medication to last until your next medication refill appointment. Taking Medication: Take medication as prescribed. When it comes to controlled substances, taking less pills or less frequently than prescribed is permitted and encouraged. Never take more pills than instructed. Never take the medication more frequently than prescribed.  Inform other Doctors: Always inform, all of your healthcare providers, of all the  medications you take. Pain Medication from other Providers: You are not allowed to accept any additional pain medication from any other Doctor or Healthcare provider. There are two exceptions to this rule. (see below) In the event that you require additional pain medication, you are responsible for notifying us, as stated below. Cough Medicine: Often these contain an opioid, such as codeine or hydrocodone. Never accept or take cough medicine containing these opioids if you are already taking an  opioid* medication. The combination may cause respiratory failure and death. Medication Agreement: You are responsible for carefully reading and following our Medication Agreement. This must be signed before receiving any prescriptions from our practice. Safely store a copy of your signed Agreement. Violations to the Agreement will result in no further prescriptions. (Additional copies of our Medication Agreement are available upon request.) Laws, Rules, & Regulations: All patients are expected to follow all [address] and [employer], Rules, [place] [employer]. Ignorance of the Laws does not constitute a valid excuse.  Illegal drugs and Controlled Substances: The use of illegal substances (including, but not limited to marijuana and its derivatives) and/or the illegal use of any controlled substances is strictly prohibited. Violation of this rule may result in the immediate and permanent discontinuation of any and all prescriptions being written by our practice. The use of any illegal substances is prohibited. Adopted CDC guidelines & recommendations: Target dosing levels will be at or below 60 MME/day. Use of benzodiazepines** is not recommended. Urine Drug testing: Patients taking controlled substances will be required to provide a urine sample upon request. Do not void before coming to your medication management appointments. Hold emptying your bladder until you are admitted. The admitting nurse will inform you if a sample is required. Our practice reserves the right to call you at any time to provide a sample. Once receiving the call, you have 24 hours to comply with request. Not providing a sample upon request may result in termination of medication therapy.  Exceptions: There are only two exceptions to the rule of not receiving pain medications from other Healthcare Providers. Exception #1 (Emergencies): In the event of an emergency (i.e.: accident requiring emergency care), you are allowed to  receive additional pain medication. However, you are responsible for: As soon as you are able, call our office [phone], at any time of the day or night, and leave a message stating your name, the date and nature of the emergency, and the name and dose of the medication prescribed. In the event that your call is answered by a member of our staff, make sure to document and save the date, time, and the name of the person that took your information.  Exception #2 (Planned Surgery): In the event that you are scheduled by another doctor or dentist to have any type of surgery or procedure, you are allowed (for a period no longer than 30 days), to receive additional pain medication, for the acute post-op pain. However, in this case, you are responsible for picking up a copy of our "Post-op Pain Management for Surgeons" handout, and giving it to your surgeon or dentist. This document is available at our office, and does not require an appointment to obtain it. Simply go to our office during business hours (Monday-Thursday from 8:00 AM to 4:00 PM) (Friday 8:00 AM to 12:00 Noon) or if you have a scheduled appointment with [place], prior to your surgery, and ask for it by name. In addition, you are responsible for: calling our office (  [phone], at any time of the day or night, and leaving a message stating your name, name of your surgeon, type of surgery, and date of procedure or surgery. Failure to comply with your responsibilities may result in termination of therapy involving the controlled substances.  Consequences:  Non-compliance with the above rules may result in permanent discontinuation of medication prescription therapy. All patients receiving any type of controlled substance is expected to comply with the above patient responsibilities. Not doing so may result in permanent discontinuation of medication prescription therapy. Medication Agreement Violation. Following the above rules, including your  responsibilities will help you in avoiding a Medication Agreement Violation ("Breaking your Pain Medication Contract").  *Opioid medications include: morphine, codeine, oxycodone, oxymorphone, hydrocodone, hydromorphone, meperidine, tramadol, tapentadol, buprenorphine, fentanyl, methadone. **Benzodiazepine medications include: diazepam (Valium), alprazolam (Xanax), clonazepam (Klonopine), lorazepam (Ativan), clorazepate (Tranxene), chlordiazepoxide (Librium), estazolam (Prosom), oxazepam (Serax), temazepam (Restoril), triazolam (Halcion) (Last updated: 05/26/2023) ______________________________________________________________________      ______________________________________________________________________     Naloxone Nasal Spray  Why am I receiving this medication? [place] STOP ACT requires that all patients taking high dose opioids or at risk of opioids respiratory depression, be prescribed an opioid reversal agent, such as Naloxone (AKA: Narcan).  What is this medication? NALOXONE (nal OX one) treats opioid overdose, which causes slow or shallow breathing, severe drowsiness, or trouble staying awake. Call emergency services after using this medication. You may need additional treatment. Naloxone works by reversing the effects of opioids. It belongs to a group of medications called opioid blockers.  COMMON BRAND NAME(S): Kloxxado, Narcan  What should I tell my care team before I take this medication? They need to know if you have any of these conditions: Heart disease Substance use disorder An unusual or allergic reaction to naloxone, other medications, foods, dyes, or preservatives Pregnant or trying to get pregnant Breast-feeding  When to use this medication? This medication is to be used for the treatment of respiratory depression (less than 8 breaths per minute) secondary to opioid overdose.   How to use this medication? This medication is for use in the nose. Lay the  person on their back. Support their neck with your hand and allow the head to tilt back before giving the medication. The nasal spray should be given into 1 nostril. After giving the medication, move the person onto their side. Do not remove or test the nasal spray until ready to use. Get emergency medical help right away after giving the first dose of this medication, even if the person wakes up. You should be familiar with how to recognize the signs and symptoms of a narcotic overdose. If more doses are needed, give the additional dose in the other nostril. Talk to your care team about the use of this medication in children. While this medication may be prescribed for children as young as newborns for selected conditions, precautions do apply.  Naloxone Overdosage: If you think you have taken too much of this medicine contact a poison control center or emergency room at once.  NOTE: This medicine is only for you. Do not share this medicine with others.  What if I miss a dose? This does not apply.  What may interact with this medication? This is only used during an emergency. No interactions are expected during emergency use. This list may not describe all possible interactions. Give your health care provider a list of all the medicines, herbs, non-prescription drugs, or dietary supplements you use. Also tell them if you  smoke, drink alcohol, or use illegal drugs. Some items may interact with your medicine.  What should I watch for while using this medication? Keep this medication ready for use in the case of an opioid overdose. Make sure that you have the phone number of your care team and local hospital ready. You may need to have additional doses of this medication. Each nasal spray contains a single dose. Some emergencies may require additional doses. After use, bring the treated person to the nearest hospital or call 911. Make sure the treating care team knows that the person has received a  dose of this medication. You will receive additional instructions on what to do during and after use of this medication before an emergency occurs.  What side effects may I notice from receiving this medication? Side effects that you should report to your care team as soon as possible: Allergic reactions--skin rash, itching, hives, swelling of the face, lips, tongue, or throat Side effects that usually do not require medical attention (report these to your care team if they continue or are bothersome): Constipation Dryness or irritation inside the nose Headache Increase in blood pressure Muscle spasms Stuffy nose Toothache This list may not describe all possible side effects. Call your doctor for medical advice about side effects. You may report side effects to FDA at 1-800-FDA-1088.  Where should I keep my medication? Because this is an emergency medication, you should keep it with you at all times.  Keep out of the reach of children and pets. Store between 20 and 25 degrees C (68 and 77 degrees F). Do not freeze. Throw away any unused medication after the expiration date. Keep in original box until ready to use.  NOTE: This sheet is a summary. It may not cover all possible information. If you have questions about this medicine, talk to your doctor, pharmacist, or health care provider.   2023 [employer]/Gold Standard (2021-03-28 00:00:00)  ______________________________________________________________________

## 2023-09-06 NOTE — Progress Notes (Signed)
Nursing Pain Medication Assessment:  Safety precautions to be maintained throughout the outpatient stay will include: orient to surroundings, keep bed in low position, maintain call bell within reach at all times, provide assistance with transfer out of bed and ambulation.  Medication Inspection Compliance: Pill count conducted under aseptic conditions, in front of the patient. Neither the pills nor the bottle was removed from the patient's sight at any time. Once count was completed pills were immediately returned to the patient in their original bottle.  Medication: Oxycodone IR Pill/Patch Count:  11 of 120 pills remain Pill/Patch Appearance: Markings consistent with prescribed medication Bottle Appearance: Standard pharmacy container. Clearly labeled. Filled Date: 1 / 7 / 2025 Last Medication intake:  Today

## 2023-10-03 ENCOUNTER — Other Ambulatory Visit: Payer: Self-pay | Admitting: Cardiovascular Disease

## 2023-10-03 DIAGNOSIS — E782 Mixed hyperlipidemia: Secondary | ICD-10-CM

## 2023-10-03 DIAGNOSIS — I10 Essential (primary) hypertension: Secondary | ICD-10-CM

## 2023-10-04 ENCOUNTER — Ambulatory Visit (HOSPITAL_COMMUNITY): Payer: Medicare Other | Attending: Cardiovascular Disease

## 2023-10-04 ENCOUNTER — Encounter (HOSPITAL_COMMUNITY): Payer: Self-pay | Admitting: Cardiovascular Disease

## 2023-10-11 ENCOUNTER — Ambulatory Visit: Payer: Medicare Other | Attending: Cardiovascular Disease | Admitting: Cardiovascular Disease

## 2023-10-12 ENCOUNTER — Encounter: Payer: Self-pay | Admitting: Cardiovascular Disease

## 2023-12-02 NOTE — Progress Notes (Unsigned)
 PROVIDER NOTE: Interpretation of information contained herein should be left to medically-trained personnel. Specific patient instructions are provided elsewhere under "Patient Instructions" section of medical record. This document was created in part using AI and STT-dictation technology, any transcriptional errors that may result from this process are unintentional.  Patient: [name] [name]  Service: E/M   PCP: Abbe Hoard., MD  DOB: April 20, 1951  DOS: 12/06/2023  Provider: Cherylin Corrigan, NP  MRN: [id number]  Delivery: Face-to-face  Specialty: Interventional Pain Management  Type: Established Patient  Setting: Ambulatory outpatient facility  Specialty designation: 09  Referring Prov.: Abbe Hoard., MD  Location: Outpatient office facility       HPI  [name] [name], a 73 y.o. year old male, is here today because of his No primary diagnosis found.. [name] [name] primary complain today is No chief complaint on file.  Pertinent problems: [name] [name] does not have any pertinent problems on file. Pain Assessment: Severity of   is reported as a  /10. Location:    / . Onset:  . Quality:  . Timing:  . Modifying factor(s):  Aaron Aas Vitals:  vitals were not taken for this visit.  BMI: Estimated body mass index is 22.96 kg/m as calculated from the following:   Height as of 09/06/23: 5\' 8"  (1.727 m).   Weight as of 09/06/23: 151 lb (68.5 kg). Last encounter: 09/06/2023. Last procedure: Visit date not found.  Reason for encounter: {Blank single:19197::"medication management","post-procedure evaluation and assessment","both, medication management and post-procedure evaluation and assessment","evaluation of worsening, or previously known (established) problem","patient-requested evaluation","follow-up evaluation","evaluation for possible interventional PM therapy/treatment","evaluation of new problem"," *** "}. ***  Discussed the use of AI scribe software for clinical note transcription with the patient, who gave  verbal consent to proceed.  History of Present Illness           Pharmacotherapy Assessment  Analgesic: {There is no content from the last Subjective section.}   Monitoring: [place] PMP: PDMP not reviewed this encounter. 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)","No side-effects or adverse reactions reported."} Compliance: {Blank single:19197::"Medication agreement violation - unsanctioned acquisition/use of additional opioid-containing medication","No problems identified."} Effectiveness: {Blank single:19197::"Clinically acceptable."}  No notes on file  No results found for: "CBDTHCR" No results found for: "D8THCCBX" No results found for: "D9THCCBX"  UDS:  Summary  Date Value Ref Range Status  12/09/2022 Note  Final    Comment:    ==================================================================== [facility] (MW) ==================================================================== Test                             Result       Flag       Units  Drug Present and Declared for Prescription Verification   Oxycodone                       2391         EXPECTED   ng/mg creat   Oxymorphone                    5528         EXPECTED   ng/mg creat   Noroxycodone                   4627         EXPECTED  ng/mg creat   Noroxymorphone                 2192         EXPECTED   ng/mg creat    Sources of oxycodone  are scheduled prescription medications.    Oxymorphone, noroxycodone, and noroxymorphone are expected    metabolites of oxycodone . Oxymorphone is also available as a    scheduled prescription medication.  ==================================================================== Test                      Result    Flag   Units      Ref Range   Creatinine              144              mg/dL       >=16 ==================================================================== Declared Medications:  The flagging and interpretation on this report are based on the  following declared medications.  Unexpected results may arise from  inaccuracies in the declared medications.   **Note: The testing scope of this panel includes these medications:   Oxycodone    **Note: The testing scope of this panel does not include the  following reported medications:   Aspirin   Atorvastatin  (Lipitor)  Clopidogrel  (Plavix )  Hydrochlorothiazide   Isosorbide  (Imdur )  Magnesium  (Mag-Ox)  Metoprolol  (Toprol )  Naloxone  (Narcan )  Nitroglycerin  (Nitrostat )  Pantoprazole  (Protonix )  Spironolactone  (Aldactone )  Vitamin B12  Vitamin D3 ==================================================================== For clinical consultation, please call [phone]. ====================================================================       ROS  Constitutional: {Blank single:19197::"Denies any fever or chills"} Gastrointestinal: {Blank single:19197::"No reported hemesis, hematochezia, vomiting, or acute GI distress"} Musculoskeletal: {Blank single:19197::"Denies any acute onset joint swelling, redness, loss of ROM, or weakness"} Neurological: {Blank single:19197::"No reported episodes of acute onset apraxia, aphasia, dysarthria, agnosia, amnesia, paralysis, loss of coordination, or loss of consciousness"}  Medication Review  Oxycodone  HCl, Vitamin D3, aspirin , atorvastatin , clopidogrel , cyanocobalamin, hydrochlorothiazide , isosorbide  dinitrate, isosorbide  mononitrate, magnesium  oxide, metoprolol  succinate, naloxone , nitroGLYCERIN , pantoprazole , and spironolactone   History Review  Allergy: [name] [name] is allergic to gabapentin . Drug: [name] [name]  reports no history of drug use. Alcohol:  reports no history of alcohol use. Tobacco:  reports that he has quit smoking. He has never used smokeless tobacco. Social: Mr.  [name]  reports that he has quit smoking. He has never used smokeless tobacco. He reports that he does not drink alcohol and does not use drugs. Medical:  has a past medical history of CAD (coronary artery disease), Chronic pain syndrome, GERD (gastroesophageal reflux disease), Hyperlipidemia, Hypertension, and Radiculitis of right cervical region (09/04/2015). Surgical: Mr. Strzalka  has a past surgical history that includes PCI 2009 with stenting of LIMA-LAD anastamosis; Coronary artery bypass graft; Hernia repair; Cholecystectomy; left heart catheterization with coronary/graft angiogram (N/A, 04/21/2013); and RIGHT HEART CATH (N/A, 11/18/2022). Family: family history includes Multiple sclerosis in his mother; Stroke in his father.  Laboratory Chemistry Profile   Renal Lab Results  Component Value Date   BUN 11 11/18/2022   CREATININE 0.70 11/18/2022   BCR 14 07/17/2020   GFR 108.38 12/07/2013   GFRAA 92 07/17/2020   GFRNONAA >60 06/13/2021    Hepatic Lab Results  Component Value Date   AST 43 (H) 07/17/2020   ALT 34 07/17/2020   ALBUMIN 3.8 07/17/2020   ALKPHOS 89 07/17/2020    Electrolytes Lab Results  Component Value Date   NA 140 11/18/2022   NA 140 11/18/2022   K 3.2 (L) 11/18/2022  K 3.2 (L) 11/18/2022   CL 97 (L) 11/18/2022   CALCIUM  9.2 06/13/2021   MG 1.9 03/12/2016    Bone Lab Results  Component Value Date   25OHVITD1 21 (L) 03/12/2016   25OHVITD2 2.2 03/12/2016   25OHVITD3 19 03/12/2016    Inflammation (CRP: Acute Phase) (ESR: Chronic Phase) Lab Results  Component Value Date   CRP 0.6 03/12/2016   ESRSEDRATE 21 (H) 03/12/2016         Note: {Blank single:19197::"Mr. Guilmette indicates labs done and monitored by primary care practitioner using a non-[facility] EMR system","No results found under the [facility] electronic medical record","Results made available to patient.","Lab results reviewed and made available to patient.","Lab results reviewed and explained to  patient in Layman's terms.","Above Lab results reviewed."}  Recent Imaging Review  CARDIAC CATHETERIZATION   LV end diastolic pressure is normal.  Mild pulmonary HTN, PVR 2 Woods units, otherwise unremarkable right heart  catheterization  RA pressure mean 7 RV pressure 35/1 with an RVEDP of 8 PA pressure 35/10 mean 21 Wedge pressure of 12  Transpulmonary gradient 9 mmHg, cardiac output 4.5 L/min, PVR 2 Wood units Note: {Blank single:19197::"No new results found.","No results found under the [facility] medical record.","Imaging results reviewed and explained to patient in Layman's terms.","Results of ordered imaging test(s) reviewed and explained to patient in Layman's terms.","Imaging results reviewed.","Reviewed"} {Blank single:19197::"Results visible under [facility].","Results visible under [facility].","Results visible under [facility].","Results visible under [facility].","Results visible under "Care Everywhere".","Results made available to patient.","Copy of results provided to patient.","     "}  Physical Exam  General appearance: {general exam:210120802::"Well nourished, well developed, and well hydrated. In no apparent acute distress"} Mental status: {Blank single:19197::"Alert and oriented x 3. Exaggerated physical and/or psychosocial pain behavior perceived.","Alert, oriented x 3 (person, place, & time)"} {Blank single:19197::"Mr. 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 BMI: Estimated body mass index is 22.96 kg/m as calculated from the following:   Height as of 09/06/23: 5\' 8"  (1.727 m).   Weight as of 09/06/23: 151 lb (68.5 kg). Ideal:  Patient weight not recorded  Assessment   Diagnosis Status  No diagnosis found. {Problem Stability:19197::"Deteriorating","Having a Flare-up","Improved","Improving","Not improving","Not responding","Persistent","Recurring","Reoccurring","Resolved","Responding","Stable","Unchanged","Unimproved","Worsened","Worsening","Controlled"} {Problem Stability:19197::"Deteriorating","Having a Flare-up","Improved","Improving","Not improving","Not responding","Persistent","Recurring","Reoccurring","Resolved","Responding","Stable","Unchanged","Unimproved","Worsened","Worsening","Controlled"} {Problem Stability:19197::"Deteriorating","Having a Flare-up","Improved","Improving","Not improving","Not responding","Persistent","Recurring","Reoccurring","Resolved","Responding","Stable","Unchanged","Unimproved","Worsened","Worsening","Controlled"}   Updated Problems: No problems updated.  Plan of Care  Problem-specific:  Assessment and Plan            Mr. Ferd G Majkowski has a current medication list which includes the following long-term medication(s): atorvastatin , vitamin d3, isosorbide  mononitrate, magnesium  oxide, metoprolol  succinate, nitroglycerin , oxycodone  hcl, oxycodone  hcl, oxycodone  hcl, pantoprazole , and spironolactone .  Pharmacotherapy (Medications Ordered): No orders of the defined types were placed in this encounter.  Orders:  No orders of the defined types were placed in this encounter.  Follow-up plan:   No follow-ups on file.     {There is no content from the last Plan section.}   Recent Visits Date Type Provider Dept  09/06/23 Office Visit Renaldo Caroli, MD [facility]  Showing recent visits within past 90 days and meeting all other requirements Future Appointments Date Type Provider Dept  12/06/23 Appointment Renaldo Caroli, MD [facility]  Showing future appointments within next 90 days and meeting all other requirements  I discussed the  assessment  and treatment plan with the patient. The patient was provided an opportunity to ask questions and all were answered. The patient agreed with the plan and demonstrated an understanding of the instructions.  Patient advised to call back or seek an in-person evaluation if the symptoms or condition worsens.  Duration of encounter: *** minutes.  Total time on encounter, as per AMA guidelines included both the face-to-face and non-face-to-face time personally spent by the physician and/or other qualified health care professional(s) on the day of the encounter (includes time in activities that require the physician or other qualified health care professional and does not include time in activities normally performed by clinical staff). Physician's time may include the following activities when performed: Preparing to see the patient (e.g., pre-charting review of records, searching for previously ordered imaging, lab work, and nerve conduction tests) Review of prior analgesic pharmacotherapies. Reviewing PMP Interpreting ordered tests (e.g., lab work, imaging, nerve conduction tests) Performing post-procedure evaluations, including interpretation of diagnostic procedures Obtaining and/or reviewing separately obtained history Performing a medically appropriate examination and/or evaluation Counseling and educating the patient/family/caregiver Ordering medications, tests, or procedures Referring and communicating with other health care professionals (when not separately reported) Documenting clinical information in the electronic or other health record Independently interpreting results (not separately reported) and communicating results to the patient/ family/caregiver Care coordination (not separately reported)  Note by: Taleia Sadowski K Ziair Penson, NP (TTS and AI technology used. I apologize for any typographical errors that were not detected and corrected.) Date: 12/06/2023; Time: 3:24 PM

## 2023-12-06 ENCOUNTER — Ambulatory Visit (HOSPITAL_BASED_OUTPATIENT_CLINIC_OR_DEPARTMENT_OTHER): Payer: Medicare Other | Admitting: Pain Medicine

## 2023-12-06 DIAGNOSIS — M5030 Other cervical disc degeneration, unspecified cervical region: Secondary | ICD-10-CM

## 2023-12-06 DIAGNOSIS — Z79891 Long term (current) use of opiate analgesic: Secondary | ICD-10-CM

## 2023-12-06 DIAGNOSIS — M47816 Spondylosis without myelopathy or radiculopathy, lumbar region: Secondary | ICD-10-CM

## 2023-12-06 DIAGNOSIS — Z91199 Patient's noncompliance with other medical treatment and regimen due to unspecified reason: Secondary | ICD-10-CM

## 2023-12-06 DIAGNOSIS — G8929 Other chronic pain: Secondary | ICD-10-CM

## 2023-12-06 DIAGNOSIS — Z79899 Other long term (current) drug therapy: Secondary | ICD-10-CM

## 2023-12-06 DIAGNOSIS — G894 Chronic pain syndrome: Secondary | ICD-10-CM

## 2023-12-08 NOTE — Progress Notes (Unsigned)
 PROVIDER NOTE: Interpretation of information contained herein should be left to medically-trained personnel. Specific patient instructions are provided elsewhere under "Patient Instructions" section of medical record. This document was created in part using AI and STT-dictation technology, any transcriptional errors that may result from this process are unintentional.  Patient: [name] [name]  Service: E/M   PCP: Abbe Hoard., MD  DOB: 10/01/1950  DOS: 12/09/2023  Provider: Cherylin Corrigan, NP  MRN: [id number]  Delivery: Face-to-face  Specialty: Interventional Pain Management  Type: Established Patient  Setting: Ambulatory outpatient facility  Specialty designation: 09  Referring Prov.: Abbe Hoard., MD  Location: Outpatient office facility       HPI  [name], a 73 y.o. year old male, is here today because of his No primary diagnosis found.. [name] primary complain today is Right anterior leg pain (down to right knee).   Pain Assessment: Severity of Chronic pain is reported as a 8 /10. Location: Back Lower, Right, Left, Mid/Radaites to hips bialteral and to thighs bilateral. Onset: More than a month ago. Quality: Sharp, Nagging, Aching, Constant. Timing: Constant. Modifying factor(s): Pain medications calms down some. Vitals:  height is 5\' 8"  (1.727 m) and weight is 156 lb (70.8 kg). His temporal temperature is 97.5 F (36.4 C) (abnormal). His blood pressure is 127/69 and his pulse is 68. His respiration is 16 and oxygen saturation is 98%.  BMI: Estimated body mass index is 23.72 kg/m as calculated from the following:   Height as of this encounter: 5\' 8"  (1.727 m).   Weight as of this encounter: 156 lb (70.8 kg). Last encounter: 12/06/2023.  Reason for encounter: medication management.  The patient indicates doing well with the current medication regimen.  No side effects or adverse reaction reported to medication.  The patient complains of right anterior leg pain radiating down to  the right knee.  He reports that the right leg is more painful than the left (R>L).  Pharmacotherapy Assessment  Analgesic: Oxycodone  HCl 10 mg every 6 hours as needed for severe pain. MME=60  Monitoring: [place] PMP: PDMP reviewed during this encounter.       Pharmacotherapy: No side-effects or adverse reactions reported. Compliance: No problems identified. Effectiveness: Clinically acceptable.  Huston Maiers, RN  12/09/2023  9:48 AM  Sign when Signing Visit Nursing Pain Medication Assessment:  Safety precautions to be maintained throughout the outpatient stay will include: orient to surroundings, keep bed in low position, maintain call bell within reach at all times, provide assistance with transfer out of bed and ambulation.   Medication Inspection Compliance: Pill count conducted under aseptic conditions, in front of the patient. Neither the pills nor the bottle was removed from the patient's sight at any time. Once count was completed pills were immediately returned to the patient in their original bottle.  Medication: Oxycodone  HCL  Pill/Patch Count: 5 of 120 pills remain Pill/Patch Appearance: Markings consistent with prescribed medication Bottle Appearance: Standard pharmacy container. Clearly labeled. Filled Date: 04 / 07 / 2025 Last Medication intake:  Today    No results found for: "CBDTHCR" No results found for: "D8THCCBX" No results found for: "D9THCCBX"  UDS:  Summary  Date Value Ref Range Status  12/09/2022 Note  Final    Comment:    ==================================================================== [employer] Select 13 (MW) ==================================================================== Test  Result       Flag       Units  Drug Present and Declared for Prescription Verification   Oxycodone                       2391         EXPECTED   ng/mg creat   Oxymorphone                    5528         EXPECTED   ng/mg creat   Noroxycodone                    4627         EXPECTED   ng/mg creat   Noroxymorphone                 2192         EXPECTED   ng/mg creat    Sources of oxycodone  are scheduled prescription medications.    Oxymorphone, noroxycodone, and noroxymorphone are expected    metabolites of oxycodone . Oxymorphone is also available as a    scheduled prescription medication.  ==================================================================== Test                      Result    Flag   Units      Ref Range   Creatinine              144              mg/dL      >=21 ==================================================================== Declared Medications:  The flagging and interpretation on this report are based on the  following declared medications.  Unexpected results may arise from  inaccuracies in the declared medications.   **Note: The testing scope of this panel includes these medications:   Oxycodone    **Note: The testing scope of this panel does not include the  following reported medications:   Aspirin   Atorvastatin  (Lipitor)  Clopidogrel  (Plavix )  Hydrochlorothiazide   Isosorbide  (Imdur )  Magnesium  (Mag-Ox)  Metoprolol  (Toprol )  Naloxone  (Narcan )  Nitroglycerin  (Nitrostat )  Pantoprazole  (Protonix )  Spironolactone  (Aldactone )  Vitamin B12  Vitamin D3 ==================================================================== For clinical consultation, please call [phone]. ====================================================================       ROS  Constitutional: Denies any fever or chills Gastrointestinal: No reported hemesis, hematochezia, vomiting, or acute GI distress Musculoskeletal: Right anterior leg pain (R>L) down to the right knee Neurological: No reported episodes of acute onset apraxia, aphasia, dysarthria, agnosia, amnesia, paralysis, loss of coordination, or loss of consciousness  Medication Review  Oxycodone  HCl, Vitamin D3, aspirin , atorvastatin , clopidogrel , cyanocobalamin,  hydrochlorothiazide , isosorbide  dinitrate, isosorbide  mononitrate, magnesium  oxide, metoprolol  succinate, naloxone , nitroGLYCERIN , pantoprazole , and spironolactone   History Review  Allergy: [name] is allergic to gabapentin . Drug: [name]  reports no history of drug use. Alcohol:  reports no history of alcohol use. Tobacco:  reports that he has quit smoking. He has never used smokeless tobacco. Social: [name]  reports that he has quit smoking. He has never used smokeless tobacco. He reports that he does not drink alcohol and does not use drugs. Medical:  has a past medical history of CAD (coronary artery disease), Chronic pain syndrome, GERD (gastroesophageal reflux disease), Hyperlipidemia, Hypertension, and Radiculitis of right cervical region (09/04/2015). Surgical: [name]  has a past surgical history that includes PCI 2009 with stenting of LIMA-LAD anastamosis; Coronary artery bypass graft; Hernia repair; Cholecystectomy; left heart catheterization with  coronary/graft angiogram (N/A, 04/21/2013); and RIGHT HEART CATH (N/A, 11/18/2022). Family: family history includes Multiple sclerosis in his mother; Stroke in his father.  Laboratory Chemistry Profile   Renal Lab Results  Component Value Date   BUN 11 11/18/2022   CREATININE 0.70 11/18/2022   BCR 14 07/17/2020   GFR 108.38 12/07/2013   GFRAA 92 07/17/2020   GFRNONAA >60 06/13/2021    Hepatic Lab Results  Component Value Date   AST 43 (H) 07/17/2020   ALT 34 07/17/2020   ALBUMIN 3.8 07/17/2020   ALKPHOS 89 07/17/2020    Electrolytes Lab Results  Component Value Date   NA 140 11/18/2022   NA 140 11/18/2022   K 3.2 (L) 11/18/2022   K 3.2 (L) 11/18/2022   CL 97 (L) 11/18/2022   CALCIUM  9.2 06/13/2021   MG 1.9 03/12/2016    Bone Lab Results  Component Value Date   25OHVITD1 21 (L) 03/12/2016   25OHVITD2 2.2 03/12/2016   25OHVITD3 19 03/12/2016    Inflammation (CRP: Acute Phase) (ESR: Chronic Phase) Lab Results   Component Value Date   CRP 0.6 03/12/2016   ESRSEDRATE 21 (H) 03/12/2016         Note: Above Lab results reviewed.  Recent Imaging Review  CARDIAC CATHETERIZATION   LV end diastolic pressure is normal.  Mild pulmonary HTN, PVR 2 Woods units, otherwise unremarkable right heart  catheterization  RA pressure mean 7 RV pressure 35/1 with an RVEDP of 8 PA pressure 35/10 mean 21 Wedge pressure of 12  Transpulmonary gradient 9 mmHg, cardiac output 4.5 L/min, PVR 2 Wood units Note: Reviewed        Physical Exam  General appearance: Well nourished, well developed, and well hydrated. In no apparent acute distress Mental status: Alert, oriented x 3 (person, place, & time)       Respiratory: No evidence of acute respiratory distress Eyes: PERLA Vitals: BP 127/69 (Patient Position: Sitting)   Pulse 68   Temp (!) 97.5 F (36.4 C) (Temporal)   Resp 16   Ht 5\' 8"  (1.727 m)   Wt 156 lb (70.8 kg)   SpO2 98%   BMI 23.72 kg/m  BMI: Estimated body mass index is 23.72 kg/m as calculated from the following:   Height as of this encounter: 5\' 8"  (1.727 m).   Weight as of this encounter: 156 lb (70.8 kg). Ideal: Ideal body weight: 68.4 kg (150 lb 12.7 oz) Adjusted ideal body weight: 69.3 kg (152 lb 14 oz)  Assessment   Diagnosis Status  1. Chronic lower extremity pain (1ry area of Pain) (Bilateral) (L>R)   2. 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 Chronic neck pain (3ry area of Pain) (Bilateral) (L>R)   4. Chronic upper extremity pain (4th area of Pain) (Bilateral)   5. DDD (degenerative disc disease), cervical   6. Lumbar facet syndrome (Bilateral) (L>R)   7. Chronic pain syndrome   8. Pharmacologic therapy   9. Chronic use of opiate for therapeutic purpose   10. Encounter for medication management   11. Encounter for chronic pain management    Controlled Controlled Controlled   Plan of Care  Assessment and Plan We will continue on  current medication regimen. Prescribing drug monitoring (PDMP) consistent with prescribed medication.  Routine UDS ordered today.   Interventional Therapies  Risk  Complexity Considerations:   NOTE: PLAVIX  ANTICOAGULATION (Stop: 7-10 days  Restart: 2 hrs) According to the patient  he is unable to stop anticoagulation due to high risk of vascular occlusion. Heart disease; CAD; s/p CABG; hypercholesterolemia; hyperlipidemia Prediabetes    Planned  Pending:        Under consideration:   None due to patient being unable to stop anticoagulation.    Completed:   None    Therapeutic  Palliative (PRN) options:   None    Pharmacotherapy  Nonopioids transferred 06/26/2020: Gabapentin , magnesium , and vitamin D       Pharmacotherapy (Medications Ordered): Meds ordered this encounter  Medications   Oxycodone  HCl 10 MG TABS    Sig: Take 1 tablet (10 mg total) by mouth every 6 (six) hours as needed (Severe pain). Must last 30 days.    Dispense:  120 tablet    Refill:  0    DO NOT: delete (not duplicate); no partial-fill (will deny script to complete), no refill request (F/U required). DISPENSE: 1 day early if closed on fill date. WARN: No CNS-depressants within 8 hrs of med.   Oxycodone  HCl 10 MG TABS    Sig: Take 1 tablet (10 mg total) by mouth every 6 (six) hours as needed (Severe pain). Must last 30 days.    Dispense:  120 tablet    Refill:  0    DO NOT: delete (not duplicate); no partial-fill (will deny script to complete), no refill request (F/U required). DISPENSE: 1 day early if closed on fill date. WARN: No CNS-depressants within 8 hrs of med.   Oxycodone  HCl 10 MG TABS    Sig: Take 1 tablet (10 mg total) by mouth every 6 (six) hours as needed (Severe pain). Must last 30 days.    Dispense:  120 tablet    Refill:  0    DO NOT: delete (not duplicate); no partial-fill (will deny script to complete), no refill request (F/U required). DISPENSE: 1 day early if closed on fill date.  WARN: No CNS-depressants within 8 hrs of med.   Orders:  Orders Placed This Encounter  Procedures   Tox[employer]SU[employer] Select 13 (MW), Urine    Volume: 30 ml(s). Minimum 3 ml of urine is needed. Document temperature of fresh sample. Indications: Long term (current) use of opiate analgesic (W09.811)    Release to patient:   Immediate   Follow-up plan:   Return in about 3 months (around 03/10/2024) for (F2F), (MM), Marthe Slain NP.        Recent Visits Date Type Provider Dept  12/06/23 Office Visit Renaldo Caroli, MD [facility]  Showing recent visits within past 90 days and meeting all other requirements Today's Visits Date Type Provider Dept  12/09/23 Office Visit Oswaldo Cueto K, NP [facility]  Showing today's visits and meeting all other requirements Future Appointments No visits were found meeting these conditions. Showing future appointments within next 90 days and meeting all other requirements  I discussed the assessment and treatment plan with the patient. The patient was provided an opportunity to ask questions and all were answered. The patient agreed with the plan and demonstrated an understanding of the instructions.  Patient advised to call back or seek an in-person evaluation if the symptoms or condition worsens.  Duration of encounter: 30 minutes.  Total time on encounter, as per AMA guidelines included both the face-to-face and non-face-to-face time personally spent by the physician and/or other qualified health care professional(s) on the day of the encounter (includes time in activities that require the physician or other qualified health care professional and does  not include time in activities normally performed by clinical staff). Physician's time may include the following activities when performed: Preparing to see the patient (e.g., pre-charting review of records, searching for previously ordered imaging, lab work, and nerve conduction  tests) Review of prior analgesic pharmacotherapies. Reviewing PMP Interpreting ordered tests (e.g., lab work, imaging, nerve conduction tests) Performing post-procedure evaluations, including interpretation of diagnostic procedures Obtaining and/or reviewing separately obtained history Performing a medically appropriate examination and/or evaluation Counseling and educating the patient/family/caregiver Ordering medications, tests, or procedures Referring and communicating with other health care professionals (when not separately reported) Documenting clinical information in the electronic or other health record Independently interpreting results (not separately reported) and communicating results to the patient/ family/caregiver Care coordination (not separately reported)  Note by: Aadhya Bustamante K Aemon Koeller, NP (TTS and AI technology used. I apologize for any typographical errors that were not detected and corrected.) Date: 12/09/2023; Time: 10:19 AM

## 2023-12-09 ENCOUNTER — Ambulatory Visit: Attending: Nurse Practitioner | Admitting: Nurse Practitioner

## 2023-12-09 ENCOUNTER — Encounter: Payer: Self-pay | Admitting: Nurse Practitioner

## 2023-12-09 DIAGNOSIS — Z79899 Other long term (current) drug therapy: Secondary | ICD-10-CM | POA: Diagnosis present

## 2023-12-09 DIAGNOSIS — M79605 Pain in left leg: Secondary | ICD-10-CM | POA: Insufficient documentation

## 2023-12-09 DIAGNOSIS — G894 Chronic pain syndrome: Secondary | ICD-10-CM | POA: Diagnosis present

## 2023-12-09 DIAGNOSIS — M5442 Lumbago with sciatica, left side: Secondary | ICD-10-CM | POA: Insufficient documentation

## 2023-12-09 DIAGNOSIS — M79601 Pain in right arm: Secondary | ICD-10-CM | POA: Diagnosis present

## 2023-12-09 DIAGNOSIS — M79602 Pain in left arm: Secondary | ICD-10-CM | POA: Insufficient documentation

## 2023-12-09 DIAGNOSIS — G8929 Other chronic pain: Secondary | ICD-10-CM | POA: Insufficient documentation

## 2023-12-09 DIAGNOSIS — M79604 Pain in right leg: Secondary | ICD-10-CM | POA: Diagnosis not present

## 2023-12-09 DIAGNOSIS — M542 Cervicalgia: Secondary | ICD-10-CM | POA: Insufficient documentation

## 2023-12-09 DIAGNOSIS — Z79891 Long term (current) use of opiate analgesic: Secondary | ICD-10-CM | POA: Diagnosis present

## 2023-12-09 DIAGNOSIS — M47816 Spondylosis without myelopathy or radiculopathy, lumbar region: Secondary | ICD-10-CM | POA: Insufficient documentation

## 2023-12-09 DIAGNOSIS — M5030 Other cervical disc degeneration, unspecified cervical region: Secondary | ICD-10-CM | POA: Diagnosis present

## 2023-12-09 MED ORDER — OXYCODONE HCL 10 MG PO TABS: 10.0000 mg | ORAL_TABLET | Freq: Four times a day (QID) | ORAL | 0 refills | Status: DC | PRN

## 2023-12-09 NOTE — Patient Instructions (Signed)
Medication Rules  Applies to: All patients receiving prescriptions (written or electronic).  Pharmacy of record: Pharmacy where electronic prescriptions will be sent. If written prescriptions are taken to a different pharmacy, please inform the nursing staff. The pharmacy listed in the electronic medical record should be the one where you would like electronic prescriptions to be sent.  Prescription refills: Only during scheduled appointments. Applies to both, written and electronic prescriptions.  NOTE: The following applies primarily to controlled substances (Opioid* Pain Medications).   Patient's responsibilities: Pain Pills: Bring all pain pills to every appointment (except for procedure appointments). Pill Bottles: Bring pills in original pharmacy bottle. Always bring newest bottle. Bring bottle, even if empty. Medication refills: You are responsible for knowing and keeping track of what medications you need refilled. The day before your appointment, write a list of all prescriptions that need to be refilled. Bring that list to your appointment and give it to the admitting nurse. Prescriptions will be written only during appointments. If you forget a medication, it will not be "Called in", "Faxed", or "electronically sent". You will need to get another appointment to get these prescribed. Prescription Accuracy: You are responsible for carefully inspecting your prescriptions before leaving our office. Have the discharge nurse carefully go over each prescription with you, before taking them home. Make sure that your name is accurately spelled, that your address is correct. Check the name and dose of your medication to make sure it is accurate. Check the number of pills, and the written instructions to make sure they are clear and accurate. Make sure that you are given enough medication to last until your next medication refill appointment. Taking Medication: Take medication as prescribed. Never  take more pills than instructed. Never take medication more frequently than prescribed. Taking less pills or less frequently is permitted and encouraged, when it comes to controlled substances (written prescriptions).  Inform other Doctors: Always inform, all of your healthcare providers, of all the medications you take. Pain Medication from other Providers: You are not allowed to accept any additional pain medication from any other Doctor or Healthcare provider. There are two exceptions to this rule. (see below) In the event that you require additional pain medication, you are responsible for notifying us, as stated below. Medication Agreement: You are responsible for carefully reading and following our Medication Agreement. This must be signed before receiving any prescriptions from our practice. Safely store a copy of your signed Agreement. Violations to the Agreement will result in no further prescriptions. (Additional copies of our Medication Agreement are available upon request.) Laws, Rules, & Regulations: All patients are expected to follow all Federal and State Laws, Statutes, Rules, & Regulations. Ignorance of the Laws does not constitute a valid excuse. The use of any illegal substances is prohibited. Adopted CDC guidelines & recommendations: Target dosing levels will be at or below 60 MME/day. Use of benzodiazepines** is not recommended.  Exceptions: There are only two exceptions to the rule of not receiving pain medications from other Healthcare Providers. Exception #1 (Emergencies): In the event of an emergency (i.e.: accident requiring emergency care), you are allowed to receive additional pain medication. However, you are responsible for: As soon as you are able, call our office [phone], at any time of the day or night, and leave a message stating your name, the date and nature of the emergency, and the name and dose of the medication prescribed. In the event that your call is answered  by a member of   our staff, make sure to document and save the date, time, and the name of the person that took your information.  Exception #2 (Planned Surgery): In the event that you are scheduled by another doctor or dentist to have any type of surgery or procedure, you are allowed (for a period no longer than 30 days), to receive additional pain medication, for the acute post-op pain. However, in this case, you are responsible for picking up a copy of our "Post-op Pain Management for Surgeons" handout, and giving it to your surgeon or dentist. This document is available at our office, and does not require an appointment to obtain it. Simply go to our office during business hours (Monday-Thursday from 8:00 AM to 4:00 PM) (Friday 8:00 AM to 12:00 Noon) or if you have a scheduled appointment with us, prior to your surgery, and ask for it by name. In addition, you will need to provide us with your name, name of your surgeon, type of surgery, and date of procedure or surgery.  *Opioid medications include: morphine, codeine, oxycodone, oxymorphone, hydrocodone, hydromorphone, meperidine, tramadol, tapentadol, buprenorphine, fentanyl, methadone. **Benzodiazepine medications include: diazepam (Valium), alprazolam (Xanax), clonazepam (Klonopine), lorazepam (Ativan), clorazepate (Tranxene), chlordiazepoxide (Librium), estazolam (Prosom), oxazepam (Serax), temazepam (Restoril), triazolam (Halcion) (Last updated: 09/30/2017)  

## 2023-12-09 NOTE — Progress Notes (Signed)
 Nursing Pain Medication Assessment:  Safety precautions to be maintained throughout the outpatient stay will include: orient to surroundings, keep bed in low position, maintain call bell within reach at all times, provide assistance with transfer out of bed and ambulation.   Medication Inspection Compliance: Pill count conducted under aseptic conditions, in front of the patient. Neither the pills nor the bottle was removed from the patient's sight at any time. Once count was completed pills were immediately returned to the patient in their original bottle.  Medication: Oxycodone  HCL  Pill/Patch Count: 5 of 120 pills remain Pill/Patch Appearance: Markings consistent with prescribed medication Bottle Appearance: Standard pharmacy container. Clearly labeled. Filled Date: 04 / 07 / 2025 Last Medication intake:  Today

## 2023-12-14 LAB — TOXASSURE SELECT 13 (MW), URINE

## 2024-03-09 ENCOUNTER — Ambulatory Visit (HOSPITAL_BASED_OUTPATIENT_CLINIC_OR_DEPARTMENT_OTHER): Admitting: Nurse Practitioner

## 2024-03-09 DIAGNOSIS — Z91199 Patient's noncompliance with other medical treatment and regimen due to unspecified reason: Secondary | ICD-10-CM

## 2024-03-09 DIAGNOSIS — G894 Chronic pain syndrome: Secondary | ICD-10-CM

## 2024-03-09 NOTE — Progress Notes (Signed)
 03/09/2024-No show

## 2024-03-23 ENCOUNTER — Ambulatory Visit: Attending: Nurse Practitioner | Admitting: Nurse Practitioner

## 2024-03-23 ENCOUNTER — Encounter: Payer: Self-pay | Admitting: Nurse Practitioner

## 2024-03-23 VITALS — BP 108/75 | HR 74 | Temp 97.3°F | Resp 18 | Ht 68.0 in | Wt 150.0 lb

## 2024-03-23 DIAGNOSIS — Z79899 Other long term (current) drug therapy: Secondary | ICD-10-CM | POA: Insufficient documentation

## 2024-03-23 DIAGNOSIS — M47816 Spondylosis without myelopathy or radiculopathy, lumbar region: Secondary | ICD-10-CM | POA: Insufficient documentation

## 2024-03-23 DIAGNOSIS — G8929 Other chronic pain: Secondary | ICD-10-CM | POA: Insufficient documentation

## 2024-03-23 DIAGNOSIS — M542 Cervicalgia: Secondary | ICD-10-CM | POA: Diagnosis present

## 2024-03-23 DIAGNOSIS — G894 Chronic pain syndrome: Secondary | ICD-10-CM | POA: Insufficient documentation

## 2024-03-23 DIAGNOSIS — M79602 Pain in left arm: Secondary | ICD-10-CM | POA: Insufficient documentation

## 2024-03-23 DIAGNOSIS — M79601 Pain in right arm: Secondary | ICD-10-CM | POA: Insufficient documentation

## 2024-03-23 DIAGNOSIS — M79605 Pain in left leg: Secondary | ICD-10-CM | POA: Insufficient documentation

## 2024-03-23 DIAGNOSIS — M5030 Other cervical disc degeneration, unspecified cervical region: Secondary | ICD-10-CM | POA: Insufficient documentation

## 2024-03-23 DIAGNOSIS — Z79891 Long term (current) use of opiate analgesic: Secondary | ICD-10-CM | POA: Diagnosis present

## 2024-03-23 DIAGNOSIS — M5442 Lumbago with sciatica, left side: Secondary | ICD-10-CM | POA: Insufficient documentation

## 2024-03-23 DIAGNOSIS — M79604 Pain in right leg: Secondary | ICD-10-CM | POA: Diagnosis not present

## 2024-03-23 MED ORDER — OXYCODONE HCL 10 MG PO TABS
10.0000 mg | ORAL_TABLET | Freq: Four times a day (QID) | ORAL | 0 refills | Status: DC | PRN
Start: 2024-04-22 — End: 2024-06-15

## 2024-03-23 MED ORDER — OXYCODONE HCL 10 MG PO TABS: 10.0000 mg | ORAL_TABLET | Freq: Four times a day (QID) | ORAL | 0 refills | Status: DC | PRN

## 2024-03-23 NOTE — Addendum Note (Signed)
 Addended by: TOBIE RICHMOND on: 03/23/2024 12:00 PM   Modules accepted: Orders

## 2024-03-23 NOTE — Addendum Note (Signed)
 Addended by: TOBIE RICHMOND on: 03/23/2024 11:52 AM   Modules accepted: Level of Service

## 2024-03-23 NOTE — Progress Notes (Signed)
 Nursing Pain Medication Assessment:  Safety precautions to be maintained throughout the outpatient stay will include: orient to surroundings, keep bed in low position, maintain call bell within reach at all times, provide assistance with transfer out of bed and ambulation.  Medication Inspection Compliance: [name] [name] did not comply with our request to bring his pills to be counted. He was reminded that bringing the medication bottles, even when empty, is a requirement.  Medication: None brought in. Pill/Patch Count: None available to be counted. Bottle Appearance: No container available. Did not bring bottle(s) to appointment. Filled Date: N/A Last Medication intake:  Today Brought last bottle, filled 02-08-24, several different pills are in the bottle, unsure which ones are Oxycodone .

## 2024-03-23 NOTE — Progress Notes (Addendum)
 PROVIDER NOTE: Interpretation of information contained herein should be left to medically-trained personnel. Specific patient instructions are provided elsewhere under Patient Instructions section of medical record. This document was created in part using AI and STT-dictation technology, any transcriptional errors that may result from this process are unintentional.  Patient: [name] [name]  Service: E/M   PCP: [name] [name]  DOB: April 12, 1951  DOS: 03/23/2024  Provider: Emmy MARLA Blanch, NP  MRN: [id number]  Delivery: Face-to-face  Specialty: Interventional Pain Management  Type: Established Patient  Setting: Ambulatory outpatient facility  Specialty designation: 09  Referring Prov.: [name] [name]  Location: Outpatient office facility       History of present illness (HPI) [name] [name], a 73 y.o. year old male, is here today because of his Chronic pain syndrome [G89.4]. [name] [name] primary complain today is Back Pain and Neck Pain  Pain Assessment: Severity of Chronic pain is reported as a 7 /10. Location: Back Lower/right hip. Onset: More than a month ago. Quality: Other (Comment) (mild). Timing: Constant. Modifying factor(s): medications, exercise. Vitals:  height is 5' 8 (1.727 m) and weight is 150 lb (68 kg). His temporal temperature is 97.3 F (36.3 C) (abnormal). His blood pressure is 108/75 and his pulse is 74. His respiration is 18 and oxygen saturation is 100%.  BMI: Estimated body mass index is 22.81 kg/m as calculated from the following:   Height as of this encounter: 5' 8 (1.727 m).   Weight as of this encounter: 150 lb (68 kg).  Last encounter: 03/09/2024. Last procedure: Visit date not found.  Reason for encounter: medication management.  The patient indicates doing well with the current medication regimen.  No side effects or adverse reaction reported to medication.  The patient complains of right anterior leg pain radiating down to the right knee.  He reports that  the right leg is more painful than the left (R>L).   Pharmacotherapy Assessment   Oxycodone  HCl 10 mg every 6 hours as needed for severe pain. MME=60 Monitoring: [place] PMP: PDMP reviewed during this encounter.       Pharmacotherapy: No side-effects or adverse reactions reported. Compliance: No problems identified. Effectiveness: Clinically acceptable.  [name] Reda CROME, RN  03/23/2024 11:47 AM  Signed Nursing Pain Medication Assessment:  Safety precautions to be maintained throughout the outpatient stay will include: orient to surroundings, keep bed in low position, maintain call bell within reach at all times, provide assistance with transfer out of bed and ambulation.  Medication Inspection Compliance: [name] [name] did not comply with our request to bring his pills to be counted. He was reminded that bringing the medication bottles, even when empty, is a requirement.  Medication: None brought in. Pill/Patch Count: None available to be counted. Bottle Appearance: No container available. Did not bring bottle(s) to appointment. Filled Date: N/A Last Medication intake:  Today Brought last bottle, filled 02-08-24, several different pills are in the bottle, unsure which ones are Oxycodone .    UDS:  Summary  Date Value Ref Range Status  12/09/2023 FINAL  Final    Comment:    ==================================================================== [employer] (MW) ==================================================================== Test                             Result       Flag       Units  Drug Present and Declared for Prescription Verification   Oxycodone   1461         EXPECTED   ng/mg creat   Oxymorphone                    1164         EXPECTED   ng/mg creat   Noroxycodone                   5358         EXPECTED   ng/mg creat   Noroxymorphone                 561          EXPECTED   ng/mg creat    Sources of oxycodone  are scheduled prescription medications.     Oxymorphone, noroxycodone, and noroxymorphone are expected    metabolites of oxycodone . Oxymorphone is also available as a    scheduled prescription medication.  ==================================================================== Test                      Result    Flag   Units      Ref Range   Creatinine              148              mg/dL      >=79 ==================================================================== Declared Medications:  The flagging and interpretation on this report are based on the  following declared medications.  Unexpected results may arise from  inaccuracies in the declared medications.   **Note: The testing scope of this panel includes these medications:   Oxycodone    **Note: The testing scope of this panel does not include the  following reported medications:   Aspirin   Atorvastatin  (Lipitor)  Clopidogrel  (Plavix )  Hydrochlorothiazide   Isosorbide   Magnesium  (Mag-Ox)  Metoprolol  (Toprol )  Naloxone  (Narcan )  Nitroglycerin  (Nitrostat )  Pantoprazole  (Protonix )  Spironolactone  (Aldactone )  Vitamin B12  Vitamin D3 ==================================================================== For clinical consultation, please call [phone]. ====================================================================     No results found for: CBDTHCR No results found for: D8THCCBX No results found for: D9THCCBX  ROS  Constitutional: Denies any fever or chills Gastrointestinal: No reported hemesis, hematochezia, vomiting, or acute GI distress Musculoskeletal: Right anterior leg pain (R>L) down to the right knee  Neurological: No reported episodes of acute onset apraxia, aphasia, dysarthria, agnosia, amnesia, paralysis, loss of coordination, or loss of consciousness  Medication Review  Oxycodone  HCl, Vitamin D3, aspirin , atorvastatin , clopidogrel , cyanocobalamin, hydrochlorothiazide , isosorbide  dinitrate, isosorbide  mononitrate, magnesium  oxide,  metoprolol  succinate, nitroGLYCERIN , pantoprazole , and spironolactone   History Review  Allergy: [name] [name] is allergic to gabapentin . Drug: [name] [name]  reports no history of drug use. Alcohol:  reports no history of alcohol use. Tobacco:  reports that he has quit smoking. He has never used smokeless tobacco. Social: Mr. Sydnor  reports that he has quit smoking. He has never used smokeless tobacco. He reports that he does not drink alcohol and does not use drugs. Medical:  has a past medical history of CAD (coronary artery disease), Chronic pain syndrome, GERD (gastroesophageal reflux disease), Hyperlipidemia, Hypertension, and Radiculitis of right cervical region (09/04/2015). Surgical: Mr. Kretschmer  has a past surgical history that includes PCI 2009 with stenting of LIMA-LAD anastamosis; Coronary artery bypass graft; Hernia repair; Cholecystectomy; left heart catheterization with coronary/graft angiogram (N/A, 04/21/2013); and RIGHT HEART CATH (N/A, 11/18/2022). Family: family history includes Multiple sclerosis in his mother; Stroke in his father.  Laboratory Chemistry Profile   Renal Lab Results  Component Value Date   BUN 11 11/18/2022   CREATININE 0.70 11/18/2022   BCR 14 07/17/2020   GFR 108.38 12/07/2013   GFRAA 92 07/17/2020   GFRNONAA >60 06/13/2021    Hepatic Lab Results  Component Value Date   AST 43 (H) 07/17/2020   ALT 34 07/17/2020   ALBUMIN 3.8 07/17/2020   ALKPHOS 89 07/17/2020    Electrolytes Lab Results  Component Value Date   NA 140 11/18/2022   NA 140 11/18/2022   K 3.2 (L) 11/18/2022   K 3.2 (L) 11/18/2022   CL 97 (L) 11/18/2022   CALCIUM  9.2 06/13/2021   MG 1.9 03/12/2016    Bone Lab Results  Component Value Date   25OHVITD1 21 (L) 03/12/2016   25OHVITD2 2.2 03/12/2016   25OHVITD3 19 03/12/2016    Inflammation (CRP: Acute Phase) (ESR: Chronic Phase) Lab Results  Component Value Date   CRP 0.6 03/12/2016   ESRSEDRATE 21 (H) 03/12/2016         Note:  Above Lab results reviewed.  Recent Imaging Review  CARDIAC CATHETERIZATION   LV end diastolic pressure is normal.  Mild pulmonary HTN, PVR 2 Woods units, otherwise unremarkable right heart  catheterization  RA pressure mean 7 RV pressure 35/1 with an RVEDP of 8 PA pressure 35/10 mean 21 Wedge pressure of 12  Transpulmonary gradient 9 mmHg, cardiac output 4.5 L/min, PVR 2 Wood units Note: Reviewed        Physical Exam  Vitals: BP 108/75 (Cuff Size: Normal)   Pulse 74   Temp (!) 97.3 F (36.3 C) (Temporal)   Resp 18   Ht 5' 8 (1.727 m)   Wt 150 lb (68 kg)   SpO2 100%   BMI 22.81 kg/m  BMI: Estimated body mass index is 22.81 kg/m as calculated from the following:   Height as of this encounter: 5' 8 (1.727 m).   Weight as of this encounter: 150 lb (68 kg). Ideal: Ideal body weight: 68.4 kg (150 lb 12.7 oz) General appearance: Well nourished, well developed, and well hydrated. In no apparent acute distress Mental status: Alert, oriented x 3 (person, place, & time)       Respiratory: No evidence of acute respiratory distress Eyes: PERLA   Assessment   Diagnosis Status  1. Chronic pain syndrome   2. Chronic lower extremity pain (1ry area of Pain) (Bilateral) (L>R)   3. 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 Chronic neck pain (3ry area of Pain) (Bilateral) (L>R)   5. Chronic upper extremity pain (4th area of Pain) (Bilateral)   6. DDD (degenerative disc disease), cervical   7. Lumbar facet syndrome (Bilateral) (L>R)   8. Pharmacologic therapy   9. Chronic use of opiate for therapeutic purpose   10. Encounter for medication management   11. Encounter for chronic pain management    Controlled Controlled Controlled   Updated Problems: No problems updated.  Plan of Care  Problem-specific:  Assessment and Plan We will continue on current medication regimen. Prescribing drug monitoring (PDMP) reviewed; findings consistent  with the use of prescribed medication and no evidence of narcotic misuse or abuse. No other new issues or problems reported to this visit. Schedule follow up in 90 days for medication management.   Mr. Hinton G Jeanlouis has a current medication list which includes the following long-term medication(s): atorvastatin , vitamin d3, isosorbide  mononitrate, magnesium  oxide, metoprolol  succinate, nitroglycerin , pantoprazole , spironolactone , oxycodone  hcl, [START  ON 04/22/2024] oxycodone  hcl, and [START ON 05/22/2024] oxycodone  hcl.  Pharmacotherapy (Medications Ordered): Meds ordered this encounter  Medications   Oxycodone  HCl 10 MG TABS    Sig: Take 1 tablet (10 mg total) by mouth every 6 (six) hours as needed (Severe pain). Must last 30 days.    Dispense:  120 tablet    Refill:  0    DO NOT: delete (not duplicate); no partial-fill (will deny script to complete), no refill request (F/U required). DISPENSE: 1 day early if closed on fill date. WARN: No CNS-depressants within 8 hrs of med.   Oxycodone  HCl 10 MG TABS    Sig: Take 1 tablet (10 mg total) by mouth every 6 (six) hours as needed (Severe pain). Must last 30 days.    Dispense:  120 tablet    Refill:  0    DO NOT: delete (not duplicate); no partial-fill (will deny script to complete), no refill request (F/U required). DISPENSE: 1 day early if closed on fill date. WARN: No CNS-depressants within 8 hrs of med.   Oxycodone  HCl 10 MG TABS    Sig: Take 1 tablet (10 mg total) by mouth every 6 (six) hours as needed (Severe pain). Must last 30 days.    Dispense:  120 tablet    Refill:  0    DO NOT: delete (not duplicate); no partial-fill (will deny script to complete), no refill request (F/U required). DISPENSE: 1 day early if closed on fill date. WARN: No CNS-depressants within 8 hrs of med.   Orders:  No orders of the defined types were placed in this encounter.     Return in about 3 months (around 06/23/2024) for (F2F), (MM), [name] Blanch NP.     Recent Visits No visits were found meeting these conditions. Showing recent visits within past 90 days and meeting all other requirements Today's Visits Date Type Provider Dept  03/23/24 Office Visit Shayan Bramhall K, NP [facility]  Showing today's visits and meeting all other requirements Future Appointments No visits were found meeting these conditions. Showing future appointments within next 90 days and meeting all other requirements  I discussed the assessment and treatment plan with the patient. The patient was provided an opportunity to ask questions and all were answered. The patient agreed with the plan and demonstrated an understanding of the instructions.  Patient advised to call back or seek an in-person evaluation if the symptoms or condition worsens.  Duration of encounter: 30 minutes.  Total time on encounter, as per AMA guidelines included both the face-to-face and non-face-to-face time personally spent by the physician and/or other qualified health care professional(s) on the day of the encounter (includes time in activities that require the physician or other qualified health care professional and does not include time in activities normally performed by clinical staff). Physician's time may include the following activities when performed: Preparing to see the patient (e.g., pre-charting review of records, searching for previously ordered imaging, lab work, and nerve conduction tests) Review of prior analgesic pharmacotherapies. Reviewing PMP Interpreting ordered tests (e.g., lab work, imaging, nerve conduction tests) Performing post-procedure evaluations, including interpretation of diagnostic procedures Obtaining and/or reviewing separately obtained history Performing a medically appropriate examination and/or evaluation Counseling and educating the patient/family/caregiver Ordering medications, tests, or procedures Referring and communicating with other  health care professionals (when not separately reported) Documenting clinical information in the electronic or other health record Independently interpreting results (not separately reported) and communicating results to the patient/ family/caregiver  Care coordination (not separately reported)  Note by: Ardice Boyan K Celestia Duva, NP (TTS and AI technology used. I apologize for any typographical errors that were not detected and corrected.) Date: 03/23/2024; Time: 12:00 PM

## 2024-06-15 ENCOUNTER — Encounter: Admitting: Nurse Practitioner

## 2024-06-19 NOTE — Telephone Encounter (Signed)
Unable to reach pt, no voicemail set up

## 2024-07-07 NOTE — Progress Notes (Signed)
 07/10/2024-No show

## 2024-07-10 ENCOUNTER — Ambulatory Visit: Admitting: Nurse Practitioner

## 2024-07-10 DIAGNOSIS — Z91199 Patient's noncompliance with other medical treatment and regimen due to unspecified reason: Secondary | ICD-10-CM

## 2024-07-10 DIAGNOSIS — G8929 Other chronic pain: Secondary | ICD-10-CM

## 2024-07-10 DIAGNOSIS — Z79899 Other long term (current) drug therapy: Secondary | ICD-10-CM

## 2024-07-10 DIAGNOSIS — M47816 Spondylosis without myelopathy or radiculopathy, lumbar region: Secondary | ICD-10-CM

## 2024-07-10 DIAGNOSIS — G894 Chronic pain syndrome: Secondary | ICD-10-CM

## 2024-07-10 DIAGNOSIS — Z79891 Long term (current) use of opiate analgesic: Secondary | ICD-10-CM

## 2024-07-10 DIAGNOSIS — M5030 Other cervical disc degeneration, unspecified cervical region: Secondary | ICD-10-CM

## 2024-07-13 ENCOUNTER — Ambulatory Visit: Attending: Nurse Practitioner | Admitting: Nurse Practitioner

## 2024-07-13 ENCOUNTER — Encounter: Payer: Self-pay | Admitting: Nurse Practitioner

## 2024-07-13 DIAGNOSIS — M5030 Other cervical disc degeneration, unspecified cervical region: Secondary | ICD-10-CM | POA: Diagnosis present

## 2024-07-13 DIAGNOSIS — Z79891 Long term (current) use of opiate analgesic: Secondary | ICD-10-CM | POA: Diagnosis present

## 2024-07-13 DIAGNOSIS — M79602 Pain in left arm: Secondary | ICD-10-CM | POA: Insufficient documentation

## 2024-07-13 DIAGNOSIS — M5442 Lumbago with sciatica, left side: Secondary | ICD-10-CM | POA: Insufficient documentation

## 2024-07-13 DIAGNOSIS — M542 Cervicalgia: Secondary | ICD-10-CM | POA: Insufficient documentation

## 2024-07-13 DIAGNOSIS — M47816 Spondylosis without myelopathy or radiculopathy, lumbar region: Secondary | ICD-10-CM | POA: Diagnosis present

## 2024-07-13 DIAGNOSIS — Z79899 Other long term (current) drug therapy: Secondary | ICD-10-CM | POA: Insufficient documentation

## 2024-07-13 DIAGNOSIS — G8929 Other chronic pain: Secondary | ICD-10-CM | POA: Insufficient documentation

## 2024-07-13 DIAGNOSIS — M79605 Pain in left leg: Secondary | ICD-10-CM | POA: Insufficient documentation

## 2024-07-13 DIAGNOSIS — G894 Chronic pain syndrome: Secondary | ICD-10-CM | POA: Diagnosis present

## 2024-07-13 DIAGNOSIS — M79601 Pain in right arm: Secondary | ICD-10-CM | POA: Diagnosis present

## 2024-07-13 NOTE — Progress Notes (Signed)
 PROVIDER NOTE: Interpretation of information contained herein should be left to medically-trained personnel. Specific patient instructions are provided elsewhere under Patient Instructions section of medical record. This document was created in part using AI and STT-dictation technology, any transcriptional errors that may result from this process are unintentional.  Patient: [name] [name]  Service: E/M   PCP: Thurmond Cathlyn LABOR., MD  DOB: Feb 03, 1951  DOS: 07/13/2024  Provider: Emmy MARLA Blanch, [name] [name]  MRN: [id number]  Delivery: Face-to-face  Specialty: Interventional Pain Management  Type: Established Patient  Setting: Ambulatory outpatient facility  Specialty designation: 09  Referring Prov.: Thurmond Cathlyn LABOR., MD  Location: Outpatient office facility       History of present illness (HPI) [name] [name], a 73 y.o. year old male, is here today because of his leg pain, shoulder pain, and generalized body pain. Mr. Stmartin primary complain today is Leg Pain and Shoulder Pain  Pertinent problems: Mr. Leon has Occipital neuralgia; Chronic pain syndrome; Long term current use of opiate analgesic; Long term prescription opiate use; Opiate use (60 MME/Day); Uncomplicated opioid dependence (HCC); Chronic low back pain (2ry area of Pain) (Bilateral) (L>R) w/ sciatica (Left); Lumbar spondylosis; Lumbar facet syndrome (Bilateral) (L>R); Lumbar facet arthropathy (HCC); Chronic lower extremity pain (1ry area of Pain) (Bilateral) (L>R); Chronic lumbar radicular pain (Left) (S1 dermatome); Chronic anticoagulation (Plavix ); Chronic cervical radicular pain (Right); Chronic neck pain (3ry area of Pain) (Bilateral) (L>R); Neurogenic pain; GERD (gastroesophageal reflux disease); DDD (degenerative disc disease), lumbosacral; Chronic upper extremity radicular pain (Left); DDD (degenerative disc disease), cervical; and Pharmacologic therapy on their pertinent problem list.  Pain Assessment: Severity of Chronic pain is reported  as a 9 /10. Location: Shoulder Left, Right/Radiating into neck, and down back and bilateral legs. Onset: More than a month ago. Quality: Burning, Throbbing, Sharp. Timing: Constant. Modifying factor(s): Pain medication. Vitals:  height is 5' 8 (1.727 m) and weight is 198 lb (89.8 kg). His temporal temperature is 97.9 F (36.6 C). His blood pressure is 124/74 and his pulse is 65. His respiration is 20 and oxygen saturation is 100%.  BMI: Estimated body mass index is 30.11 kg/m as calculated from the following:   Height as of this encounter: 5' 8 (1.727 m).   Weight as of this encounter: 198 lb (89.8 kg).  Last encounter: 07/10/2024. Last procedure: 07/10/2024.  Reason for encounter: medication management.  The patient indicates doing well with current medication regimen.  No side effects or adverse reaction reported to medication.  Discussed the use of AI scribe software for clinical note transcription with the patient, who gave verbal consent to proceed.  History of Present Illness   [name] [name] is a 73 year old male who presents with generalized body pain.  He experiences generalized body pain, described as aching 'all over,' with particular emphasis on both shoulders, the lower back, and legs. The pain is severe enough to interfere with sleep and daily activities, making walking difficult without significant discomfort. No known injury could have caused the pain.  He is currently taking oxycodone  10 mg every six hours, obtained from [facility] in [facility] He reports no side effects from oxycodone  but notes that its effectiveness sometimes diminishes. Despite this, he adheres strictly to the prescribed dosage and does not double up on medication, even when the pain is intense.  He has a history of heart problems and previous surgery, which makes him cautious about medication use.     Pharmacotherapy Assessment  Oxycodone  HCl 10 mg every 6 hours as needed for severe pain.  MME=60 Monitoring: [place] PMP: PDMP reviewed during this encounter.       Pharmacotherapy: No side-effects or adverse reactions reported. Compliance: No problems identified. Effectiveness: Clinically acceptable.  [name] [name], [place]  07/13/2024  9:42 AM  Sign when Signing Visit Nursing Pain Medication Assessment:  Safety precautions to be maintained throughout the outpatient stay will include: orient to surroundings, keep bed in low position, maintain call bell within reach at all times, provide assistance with transfer out of bed and ambulation.  Medication Inspection Compliance: Pill count conducted under aseptic conditions, in front of the patient. Neither the pills nor the bottle was removed from the patient's sight at any time. Once count was completed pills were immediately returned to the patient in their original bottle.  Medication: Oxycodone  IR Pill/Patch Count: 0 of 120 pills/patches remain Pill/Patch Appearance: Markings consistent with prescribed medication Bottle Appearance: Standard pharmacy container. Clearly labeled. Filled Date: 09 / 22 / 2025 Last Medication intake:  Ran out of medicine more than 48 hours ago    UDS:  Summary  Date Value Ref Range Status  12/09/2023 FINAL  Final    Comment:    ==================================================================== [employer] Select 13 (MW) ==================================================================== Test                             Result       Flag       Units  Drug Present and Declared for Prescription Verification   Oxycodone                       1461         EXPECTED   ng/mg creat   Oxymorphone                    1164         EXPECTED   ng/mg creat   Noroxycodone                   5358         EXPECTED   ng/mg creat   Noroxymorphone                 561          EXPECTED   ng/mg creat    Sources of oxycodone  are scheduled prescription medications.    Oxymorphone, noroxycodone, and noroxymorphone are  expected    metabolites of oxycodone . Oxymorphone is also available as a    scheduled prescription medication.  ==================================================================== Test                      Result    Flag   Units      Ref Range   Creatinine              148              mg/dL      >=79 ==================================================================== Declared Medications:  The flagging and interpretation on this report are based on the  following declared medications.  Unexpected results may arise from  inaccuracies in the declared medications.   **Note: The testing scope of this panel includes these medications:   Oxycodone    **Note: The testing scope of this panel does not include the  following reported medications:   Aspirin   Atorvastatin  (Lipitor)  Clopidogrel  (Plavix )  Hydrochlorothiazide   Isosorbide   Magnesium  (Mag-Ox)  Metoprolol  (Toprol )  Naloxone  (Narcan )  Nitroglycerin  (Nitrostat )  Pantoprazole  (Protonix )  Spironolactone  (Aldactone )  Vitamin B12  Vitamin D3 ==================================================================== For clinical consultation, please call [phone]. ====================================================================     No results found for: CBDTHCR No results found for: D8THCCBX No results found for: D9THCCBX  ROS  Constitutional: Denies any fever or chills Gastrointestinal: No reported hemesis, hematochezia, vomiting, or acute GI distress Musculoskeletal: Leg pain, shoulder pain, generalized body pain Neurological: No reported episodes of acute onset apraxia, aphasia, dysarthria, agnosia, amnesia, paralysis, loss of coordination, or loss of consciousness  Medication Review  Oxycodone  HCl, Vitamin D3, aspirin , atorvastatin , clopidogrel , cyanocobalamin, donepezil, hydrochlorothiazide , isosorbide  dinitrate, isosorbide  mononitrate, magnesium  oxide, metoprolol  succinate, nitroGLYCERIN , pantoprazole , and  spironolactone   History Review  Allergy: [name] [name] is allergic to gabapentin . Drug: [name] [name]  reports no history of drug use. Alcohol:  reports no history of alcohol use. Tobacco:  reports that he has quit smoking. He has never used smokeless tobacco. Social: [name] [name]  reports that he has quit smoking. He has never used smokeless tobacco. He reports that he does not drink alcohol and does not use drugs. Medical:  has a past medical history of CAD (coronary artery disease), Chronic pain syndrome, GERD (gastroesophageal reflux disease), Hyperlipidemia, Hypertension, and Radiculitis of right cervical region (09/04/2015). Surgical: [name] [name]  has a past surgical history that includes PCI 2009 with stenting of LIMA-LAD anastamosis; Coronary artery bypass graft; Hernia repair; Cholecystectomy; left heart catheterization with coronary/graft angiogram (N/A, 04/21/2013); and RIGHT HEART CATH (N/A, 11/18/2022). Family: family history includes Multiple sclerosis in his mother; Stroke in his father.  Laboratory Chemistry Profile   Renal Lab Results  Component Value Date   BUN 11 11/18/2022   CREATININE 0.70 11/18/2022   BCR 14 07/17/2020   GFR 108.38 12/07/2013   GFRAA 92 07/17/2020   GFRNONAA >60 06/13/2021    Hepatic Lab Results  Component Value Date   AST 43 (H) 07/17/2020   ALT 34 07/17/2020   ALBUMIN 3.8 07/17/2020   ALKPHOS 89 07/17/2020    Electrolytes Lab Results  Component Value Date   NA 140 11/18/2022   NA 140 11/18/2022   [name] 3.2 (L) 11/18/2022   [name] 3.2 (L) 11/18/2022   CL 97 (L) 11/18/2022   CALCIUM  9.2 06/13/2021   MG 1.9 03/12/2016    Bone Lab Results  Component Value Date   25OHVITD1 21 (L) 03/12/2016   25OHVITD2 2.2 03/12/2016   25OHVITD3 19 03/12/2016    Inflammation (CRP: Acute Phase) (ESR: Chronic Phase) Lab Results  Component Value Date   CRP 0.6 03/12/2016   ESRSEDRATE 21 (H) 03/12/2016         Note: Above Lab results reviewed.  Recent Imaging Review   CARDIAC CATHETERIZATION   LV end diastolic pressure is normal.  Mild pulmonary HTN, PVR 2 Woods units, otherwise unremarkable right heart  catheterization  RA pressure mean 7 RV pressure 35/1 with an RVEDP of 8 PA pressure 35/10 mean 21 Wedge pressure of 12  Transpulmonary gradient 9 mmHg, cardiac output 4.5 L/min, PVR 2 Wood units Note: Reviewed        Physical Exam  Vitals: BP 124/74 (BP Location: Right Arm, Patient Position: Sitting, Cuff Size: Normal)   Pulse 65   Temp 97.9 F (36.6 C) (Temporal)   Resp 20   Ht 5' 8 (1.727 m)   Wt 198 lb (89.8 kg)   SpO2 100%   BMI 30.11 kg/m  BMI:  Estimated body mass index is 30.11 kg/m as calculated from the following:   Height as of this encounter: 5' 8 (1.727 m).   Weight as of this encounter: 198 lb (89.8 kg). Ideal: Ideal body weight: 68.4 kg (150 lb 12.7 oz) Adjusted ideal body weight: 77 kg (169 lb 10.8 oz) General appearance: Well nourished, well developed, and well hydrated. In no apparent acute distress Mental status: Alert, oriented x 3 (person, place, & time)       Respiratory: No evidence of acute respiratory distress Eyes: PERLA  Musculoskeletal: Left shoulder pain, leg pain (bilateral), and generalized body pain Assessment   Diagnosis Status  1. Chronic lower extremity pain (1ry area of Pain) (Bilateral) (L>R)   2. Chronic pain syndrome   3. Pharmacologic therapy   4. Encounter for medication management   5. Chronic low back pain (2ry area of Pain) (Bilateral) (L>R) w/ sciatica (Left)   6. Chronic neck pain (3ry area of Pain) (Bilateral) (L>R)   7. Chronic upper extremity pain (4th area of Pain) (Bilateral)   8. DDD (degenerative disc disease), cervical   9. Lumbar facet syndrome (Bilateral) (L>R)   10. Chronic use of opiate for therapeutic purpose    Controlled Controlled Controlled   Updated Problems: Problem  Encounter for Medication Management    Plan of Care  Problem-specific:  Assessment and  Plan    Chronic pain syndrome Severe diffuse pain in shoulders, lower back, and legs affecting daily activities and sleep. No recent injury. No side effects from oxycodone . - Continue oxycodone  10 mg every six hours.  Pharmacologic management of chronic pain Oxycodone  used effectively without side effects. Emphasized adherence to regimen. - Sent oxycodone  prescription to pharmacy. - Educated on medication adherence.  Patient's pain is controlled with oxycodone , will continue on current medication regimen.  Prescribing drug monitoring (PDMP) reviewed, findings consistent with the use of prescribed medication and no evidence of narcotic misuse or abuse.  Urine drug screening (UDS) up to date.  Pain contract renewed and completed.  No side effects or adverse reaction reported to medication.  Schedule follow-up in 90 days for medication management.      [name] [name] has a current medication list which includes the following long-term medication(s): atorvastatin , vitamin d3, donepezil, isosorbide  mononitrate, magnesium  oxide, metoprolol  succinate, nitroglycerin , pantoprazole , spironolactone , oxycodone  hcl, [START ON 08/12/2024] oxycodone  hcl, and [START ON 09/11/2024] oxycodone  hcl.  Pharmacotherapy (Medications Ordered): Meds ordered this encounter  Medications   Oxycodone  HCl 10 MG TABS    Sig: Take 1 tablet (10 mg total) by mouth every 6 (six) hours as needed (Severe pain). Must last 30 days.    Dispense:  120 tablet    Refill:  0    DO NOT: delete (not duplicate); no partial-fill (will deny script to complete), no refill request (F/U required). DISPENSE: 1 day early if closed on fill date. WARN: No CNS-depressants within 8 hrs of med.   Oxycodone  HCl 10 MG TABS    Sig: Take 1 tablet (10 mg total) by mouth every 6 (six) hours as needed (Severe pain). Must last 30 days.    Dispense:  120 tablet    Refill:  0    DO NOT: delete (not duplicate); no partial-fill (will deny script to  complete), no refill request (F/U required). DISPENSE: 1 day early if closed on fill date. WARN: No CNS-depressants within 8 hrs of med.   Oxycodone  HCl 10 MG TABS    Sig: Take 1 tablet (10  mg total) by mouth every 6 (six) hours as needed (Severe pain). Must last 30 days.    Dispense:  120 tablet    Refill:  0    DO NOT: delete (not duplicate); no partial-fill (will deny script to complete), no refill request (F/U required). DISPENSE: 1 day early if closed on fill date. WARN: No CNS-depressants within 8 hrs of med.   Orders:  No orders of the defined types were placed in this encounter.      Return in about 3 months (around 10/11/2024) for (F2F), (MM), [name] Blanch [name] [name].    Recent Visits Date Type Provider Dept  07/10/24 Office Visit Erricka Falkner [name], [name] [name] [facility]  Showing recent visits within past 90 days and meeting all other requirements Today's Visits Date Type Provider Dept  07/13/24 Office Visit [name] Reever [name], [name] [name] [facility]  Showing today's visits and meeting all other requirements Future Appointments Date Type Provider Dept  10/09/24 Appointment Romesha Scherer [name], [name] [name] [facility]  Showing future appointments within next 90 days and meeting all other requirements  I discussed the assessment and treatment plan with the patient. The patient was provided an opportunity to ask questions and all were answered. The patient agreed with the plan and demonstrated an understanding of the instructions.  Patient advised to call back or seek an in-person evaluation if the symptoms or condition worsens.  I personally spent a total of 30 minutes in the care of the patient today including preparing to see the patient, getting/reviewing separately obtained history, performing a medically appropriate exam/evaluation, counseling and educating, placing orders, referring and communicating with other health care professionals, documenting clinical information in the EHR,  independently interpreting results, communicating results, and coordinating care.   Note by: Cannen Dupras [name] Devlin Mcveigh, [name] [name] (TTS and AI technology used. I apologize for any typographical errors that were not detected and corrected.) Date: 07/13/2024; Time: 10:07 AM

## 2024-07-13 NOTE — Progress Notes (Signed)
 Nursing Pain Medication Assessment:  Safety precautions to be maintained throughout the outpatient stay will include: orient to surroundings, keep bed in low position, maintain call bell within reach at all times, provide assistance with transfer out of bed and ambulation.  Medication Inspection Compliance: Pill count conducted under aseptic conditions, in front of the patient. Neither the pills nor the bottle was removed from the patient's sight at any time. Once count was completed pills were immediately returned to the patient in their original bottle.  Medication: Oxycodone  IR Pill/Patch Count: 0 of 120 pills/patches remain Pill/Patch Appearance: Markings consistent with prescribed medication Bottle Appearance: Standard pharmacy container. Clearly labeled. Filled Date: 09 / 22 / 2025 Last Medication intake:  Ran out of medicine more than 48 hours ago

## 2024-07-13 NOTE — Patient Instructions (Signed)
 ______________________________________________________________________    Opioid Pain Medication Update  To: All patients taking opioid pain medications. (I.e.: hydrocodone , hydromorphone, oxycodone , oxymorphone, morphine, codeine , methadone, tapentadol, tramadol, buprenorphine, fentanyl , etc.)  Re: Updated review of side effects and adverse reactions of opioid analgesics, as well as new information about long term effects of this class of medications.  Direct risks of long-term opioid therapy are not limited to opioid addiction and overdose. Potential medical risks include serious fractures, breathing problems during sleep, hyperalgesia, immunosuppression, chronic constipation, bowel obstruction, myocardial infarction, and tooth decay secondary to xerostomia.  Unpredictable adverse effects that can occur even if you take your medication correctly: Cognitive impairment, respiratory depression, and death. Most people think that if they take their medication correctly, and as instructed, that they will be safe. Nothing could be farther from the truth. In reality, a significant amount of recorded deaths associated with the use of opioids has occurred in individuals that had taken the medication for a long time, and were taking their medication correctly. The following are examples of how this can happen: Patient taking his/her medication for a long time, as instructed, without any side effects, is given a certain antibiotic or another unrelated medication, which in turn triggers a Drug-to-drug interaction leading to disorientation, cognitive impairment, impaired reflexes, respiratory depression or an untoward event leading to serious bodily harm or injury, including death.  Patient taking his/her medication for a long time, as instructed, without any side effects, develops an acute impairment of liver and/or kidney function. This will lead to a rapid inability of the body to breakdown and eliminate  their pain medication, which will result in effects similar to an overdose, but with the same medicine and dose that they had always taken. This again may lead to disorientation, cognitive impairment, impaired reflexes, respiratory depression or an untoward event leading to serious bodily harm or injury, including death.  A similar problem will occur with patients as they grow older and their liver and kidney function begins to decrease as part of the aging process.  Background information: Historically, the original case for using long-term opioid therapy to treat chronic noncancer pain was based on safety assumptions that subsequent experience has called into question. In 1996, the American Pain Society and the American Academy of Pain Medicine issued a consensus statement supporting long-term opioid therapy. This statement acknowledged the dangers of opioid prescribing but concluded that the risk for addiction was low; respiratory depression induced by opioids was short-lived, occurred mainly in opioid-naive patients, and was antagonized by pain; tolerance was not a common problem; and efforts to control diversion should not constrain opioid prescribing. This has now proven to be wrong. Experience regarding the risks for opioid addiction, misuse, and overdose in community practice has failed to support these assumptions.  According to the Centers for Disease Control and Prevention, fatal overdoses involving opioid analgesics have increased sharply over the past decade. Currently, more than 96,700 people die from drug overdoses every year. Opioids are a factor in 7 out of every 10 overdose deaths. Deaths from drug overdose have surpassed motor vehicle accidents as the leading cause of death for individuals between the ages of 65 and 72.  Clinical data suggest that neuroendocrine dysfunction may be very common in both men and women, potentially causing hypogonadism, erectile dysfunction, infertility,  decreased libido, osteoporosis, and depression. Recent studies linked higher opioid dose to increased opioid-related mortality. Controlled observational studies reported that long-term opioid therapy may be associated with increased risk for cardiovascular events. Subsequent  meta-analysis concluded that the safety of long-term opioid therapy in elderly patients has not been proven.   Side Effects and adverse reactions: Common side effects: Drowsiness (sedation). Dizziness. Nausea and vomiting. Constipation. Physical dependence -- Dependence often manifests with withdrawal symptoms when opioids are discontinued or decreased. Tolerance -- As you take repeated doses of opioids, you require increased medication to experience the same effect of pain relief. Respiratory depression -- This can occur in healthy people, especially with higher doses. However, people with COPD, asthma or other lung conditions may be even more susceptible to fatal respiratory impairment.  Uncommon side effects: An increased sensitivity to feeling pain and extreme response to pain (hyperalgesia). Chronic use of opioids can lead to this. Delayed gastric emptying (the process by which the contents of your stomach are moved into your small intestine). Muscle rigidity. Immune system and hormonal dysfunction. Quick, involuntary muscle jerks (myoclonus). Arrhythmia. Itchy skin (pruritus). Dry mouth (xerostomia).  Long-term side effects: Chronic constipation. Sleep-disordered breathing (SDB). Increased risk of bone fractures. Hypothalamic-pituitary-adrenal dysregulation. Increased risk of overdose.  RISKS: Respiratory depression and death: Opioids increase the risk of respiratory depression and death.  Drug-to-drug interactions: Opioids are relatively contraindicated in combination with benzodiazepines, sleep inducers, and other central nervous system depressants. Other classes of medications (i.e.: certain antibiotics  and even over-the-counter medications) may also trigger or induce respiratory depression in some patients.  Medical conditions: Patients with pre-existing respiratory problems are at higher risk of respiratory failure and/or depression when in combination with opioid analgesics. Opioids are relatively contraindicated in some medical conditions such as central sleep apnea.   Fractures and Falls:  Opioids increase the risk and incidence of falls. This is of particular importance in elderly patients.  Endocrine System:  Long-term administration is associated with endocrine abnormalities (endocrinopathies). (Also known as Opioid-induced Endocrinopathy) Influences on both the hypothalamic-pituitary-adrenal axis?and the hypothalamic-pituitary-gonadal axis have been demonstrated with consequent hypogonadism and adrenal insufficiency in both sexes. Hypogonadism and decreased levels of dehydroepiandrosterone sulfate have been reported in men and women. Endocrine effects include: Amenorrhoea in women (abnormal absence of menstruation) Reduced libido in both sexes Decreased sexual function Erectile dysfunction in men Hypogonadisms (decreased testicular function with shrinkage of testicles) Infertility Depression and fatigue Loss of muscle mass Anxiety Depression Immune suppression Hyperalgesia Weight gain Anemia Osteoporosis Patients (particularly women of childbearing age) should avoid opioids. There is insufficient evidence to recommend routine monitoring of asymptomatic patients taking opioids in the long-term for hormonal deficiencies.  Immune System: Human studies have demonstrated that opioids have an immunomodulating effect. These effects are mediated via opioid receptors both on immune effector cells and in the central nervous system. Opioids have been demonstrated to have adverse effects on antimicrobial response and anti-tumour surveillance. Buprenorphine has been demonstrated to have  no impact on immune function.  Opioid Induced Hyperalgesia: Human studies have demonstrated that prolonged use of opioids can lead to a state of abnormal pain sensitivity, sometimes called opioid induced hyperalgesia (OIH). Opioid induced hyperalgesia is not usually seen in the absence of tolerance to opioid analgesia. Clinically, hyperalgesia may be diagnosed if the patient on long-term opioid therapy presents with increased pain. This might be qualitatively and anatomically distinct from pain related to disease progression or to breakthrough pain resulting from development of opioid tolerance. Pain associated with hyperalgesia tends to be more diffuse than the pre-existing pain and less defined in quality. Management of opioid induced hyperalgesia requires opioid dose reduction.  Cancer: Chronic opioid therapy has been associated with an increased risk  of cancer among noncancer patients with chronic pain. This association was more evident in chronic strong opioid users. Chronic opioid consumption causes significant pathological changes in the small intestine and colon. Epidemiological studies have found that there is a link between opium dependence and initiation of gastrointestinal cancers. Cancer is the second leading cause of death after cardiovascular disease. Chronic use of opioids can cause multiple conditions such as GERD, immunosuppression and renal damage as well as carcinogenic effects, which are associated with the incidence of cancers.   Mortality: Long-term opioid use has been associated with increased mortality among patients with chronic non-cancer pain (CNCP).  Prescription of long-acting opioids for chronic noncancer pain was associated with a significantly increased risk of all-cause mortality, including deaths from causes other than overdose.  Reference: Von Korff M, Kolodny A, Deyo RA, Chou R. Long-term opioid therapy reconsidered. Ann Intern Med. 2011 Sep 6;155(5):325-8. doi:  10.7326/[id number]. PMID: 78106373; PMCID: [id number]. Kit JINNY Laurence CINDERELLA Pearley JINNY, Hayward RA, Dunn KM, Jordan KP. Risk of adverse events in patients prescribed long-term opioids: A cohort study in the [place] Clinical Practice Research [employer]. Eur J Pain. 2019 May;23(5):908-922. doi: 10.1002/ejp.1357. Epub 2019 Jan 31. PMID: 69379883. Colameco S, Coren JS, Ciervo CA. Continuous opioid treatment for chronic noncancer pain: a time for moderation in prescribing. Postgrad Med. 2009 Jul;121(4):61-6. doi: 10.3810/pgm.2009.07.2032. PMID: 80358728. Gigi JONELLE Shlomo MILUS Levern IVER Conny RN, [place], Blazina I, Lonell DASEN, Bougatsos C, Deyo RA. The effectiveness and risks of long-term opioid therapy for chronic pain: a systematic review for a [employer] of [employer] Pathways to [employer]. Ann Intern Med. 2015 Feb 17;162(4):276-86. doi: 10.7326/M14-2559. PMID: 74418742. Rory CHRISTELLA Laurence [facility], [place] DM. NCHS Data Brief No. 22. [place]: Centers for Disease Control and Prevention; 2009. Sep, Increase in Fatal Poisonings Involving Opioid Analgesics in the [place] , 1999-2006. Song IA, Choi HR, Oh TK. Long-term opioid use and mortality in patients with chronic non-cancer pain: Ten-year follow-up study in [place] from 2010 through 2019. EClinicalMedicine. 2022 Jul 18;51:101558. doi: 10.1016/j.eclinm.2022.898441. PMID: 64124182; PMCID: [id number]. Huser, W., Schubert, T., Vogelmann, T. et al. All-cause mortality in patients with long-term opioid therapy compared with non-opioid analgesics for chronic non-cancer pain: a database study. BMC Med 18, 162 (2020). [email or web address].info/ Rashidian H, Zendehdel K, Kamangar F, Malekzadeh R, Haghdoost AA. An Ecological Study of the Association between Opiate Use and Incidence of Cancers. Addict Health. 2016 Fall;8(4):252-260. PMID: 71180443; PMCID: [id number].  Our Goal: Our goal is to control your pain with means other  than the use of opioid pain medications.  Our Recommendation: Talk to your physician about coming off of these medications. We can assist you with the tapering down and stopping these medicines. Based on the new information, even if you cannot completely stop the medication, a decrease in the dose may be associated with a lesser risk. Ask for other means of controlling the pain. Decrease or eliminate those factors that significantly contribute to your pain such as smoking, obesity, and a diet heavily tilted towards inflammatory nutrients.  Last Updated: 02/08/2023   ______________________________________________________________________       ______________________________________________________________________    Update on Controlled Substance (Opioid) Regulations   To: All patients taking opioid pain medications. (I.e.: hydrocodone , hydromorphone, oxycodone , oxymorphone, morphine, codeine , methadone, tapentadol, tramadol, buprenorphine, fentanyl , etc.)  Re: Review on the state of controlled substance regulations.  Introduction: Rules and regulations associated with all aspects of controlled substances are constantly being modified. Unfortunately we have encountered patients questioning the veracity of the information  that we provide them about these changes. This is intended to provide them with appropriate references and a historical review of these changes.  A Brief History: As of May 18, 2016, the [place]  Government declared the opioid epidemic a public health emergency. Prescription drug monitoring programs (PDMPs) and the [facility] All Schedules Prescription Electronic Reporting Act (NASPER). Before 1800, clinicians regarded pain as an existential phenomenon, a consequence of aging. There was no regulation on the use of cocaine and opioids, resulting in widespread marketing and prescribing for many ailments ranging from diarrhea to toothache. The [employer] of  [phone], passed in response to the sudden emergence of street heroin abuse as well as iatrogenic morphine dependence, influenced both physician and patient alike to avoid opiates. Patients with unexplained pain in the 1920s were regarded as deluded, malingering, or abusers, and cancer patients through the 1950s were encouraged to wean themselves off opioids until their lives could be measured in weeks. Alongside this opioid evolution, the American Pain Society launched their influential pain as the fifth vital sign campaign in 1995. Concurrently, pharmaceutical companies introduced new formulations, such as extended release oxycodone  (OxyContin ). From 1997 to 2002, OxyContin  prescriptions increased from 670,000 to 6.2 million. However, concerns soon began to surface regarding overzealous opioid treatment. It must be noted that pharmaceutical companies contributed significantly to the rise of the opioid epidemic, receiving considerable reprimands as a consequence. In 2007, as the opioid epidemic began to inflict profound damage, [employer] pleaded guilty to federal charges related to the misbranding of OxyContin . [employer] agreed to pay a total of $634.5 million to resolve Justice Department investigations, as well as a $19.5 million settlement to [address] and the [address] of [place].  In response to the current epidemic, changes in focus to the development of new abuse deterrent opioid formulations at the [place]  Food and Drug Administration (FDA) as well as drafting of new public standards for pain treatment were created at [facility] in 2017. In response to the opioid epidemic, FDA public policy changes were announced in February 2016. Among these new positions were a re-examination of the risk-benefit paradigm for opioids with strict emphasis on the large public health ramifications. The various modified opioids released over the past 20 years, such as tamper-resistant preparation, have had differing levels of success,  and are collectively referred to as Risk Evaluation and Mitigation Strategies (REMS). There is also a growing focus on preventing opioid use disorder (OUD) and on offering affected individuals accessible and effective treatment. [place]  government policy reflects these changes and both the Affordable Care Act and the Mental Health Parity and Addiction Equity Act were major steps forward in treating opioid addiction. The Affordable Care Act, which was signed into law in 2010, with major provisions coming into effect by 2014.  In the 1990s, the intensified marketing of newly reformulated prescription opioid medications (e.g., OxyContin ) and an influential pain advocacy campaign that encouraged greater pain management led to a precipitous rise in opioid use in the [place] . Research from the Centers for Disease Control and Prevention (CDC) shows that prescription opioid sales in the [place]  quadrupled from 1999 to 2010. At the same time, opioid misuse and opioid-involved overdose deaths increased (Figure 1). Between 1999 and 2010, the rate of opioidinvolved overdose deaths in the [place]  doubled from 2.9 to 6.8 deaths per 100,000 people. This initial rise in opioid-related deaths is often referred to as the first wave of the recent opioid crisis.  Between 1999 and 2020, 565,000  Americans died of opioid-involved overdoses. In turn, federal, state, and local governments responded with various legal and policy efforts to curb opioid misuse and drug-related overdose Deaths.  Recent Congresses have enacted several laws addressing the opioid crisis, such as the Comprehensive Addiction and Recovery Act of 2016 (CARA, P.L. 5160894762); the 21st Century Cures Act (P.L. 114-255); the Substance UseDisorder Prevention that Promotes Opioid Recovery and Treatment for Patients and Communities Act (SUPPORT Act, P.L. [phone]); the Fentanyl  Sanctions Act (Title LXXII of P.L. [id number]); and the Blocking  Deadly Fentanyl  Imports Act (P.L. 117-81, 6610). These laws addressed overprescribing and misuse of opioids, expanded substance use disorder prevention and treatment capacities, bolstered drug diversion capabilities, and enhanced international drug interdiction, counternarcotics cooperation, and sanctions efforts. Congress also directed additional funds to many of these initiatives through appropriations.  Congress provided funding in the [employer] Act of 2021 [phone]; P.L. 117-2) for syringe services programs (often known as needle exchange programs) and other harm reduction initiatives. Federal and state harm reduction strategies have frequently involved the distribution of naloxone (e.g., Narcan)--a medication used to reverse an opioid overdose--and test strips used to detect fentanyl  in drug samples.  The Department of Justice (DOJ) and Department of Homeland Security [facility]) aim to reduce the diversion of prescription opioids and the use, manufacturing, and trafficking of illicit opioids. DOJ--via the Drug Enforcement Administration (DEA)--regulates opioid manufacturers, distributors, and dispensers; it also controls the opioid supply through enforcement of regulatory requirements.  A History of Opiate Laws in the [place]   Prior to 1890, laws concerning opiates were strictly imposed on a local city or state-by-state basis. One of the first was in [place] in 1875 where it became illegal to smoke opium only in opium dens. It did not ban the sale, import or use otherwise. In the next 25 years different states enacted opium laws ranging from outlawing opium dens altogether to making possession of opium, morphine and heroin without a physicians prescription illegal.  The first Congressional Act took place in 1890 that levied taxes on morphine and opium. From that time on the [employer] has had a series of laws and acts directly aimed at opiate use, abuse  and control. These are outlined below:  1906 - Pure Food and Drug Act Preventing the manufacture, sale, or transportation of adulterated or misbranded or poisonous or deleterious foods, drugs, medicines, and liquors, and for regulating traffic therein, and for other purposes. Punishment included fines and prison time.  1909   - Smoking Opium Exclusion Act Banned the importation, possession and use of smoking opium. Did not regulate opium-based medications. First Freight forwarder banning the non-medical use of a substance.  1914  - The Margrette Act In summary, The Margrette Act of 1914 was written more to have all parties involved in importing, exporting, set designer and distributing opium or cocaine to register with the [employer] and have taxes levied upon them. Exempt from the law were physicians operating in the course of his professional practice  1919 - Supreme Court ratified the Bj's in [place] et [place]., v. [place]  and [place]  v. [place], then again in [facility] v. [place] , in 1920, holding that doctors may not prescribe maintenance supplies of narcotics to people addicted to narcotics. However, it does not prohibit doctors from prescribing narcotics to wean a patient off of the drug. It was also the opinion of the court that prescribing narcotics to habitual users was not considered professional practice hence it  then was considered illegal for doctors to prescribe opioids for the purposes of maintaining an addiction. It can be argued that todays addiction medications are not intended to maintain an addiction but to facilitate addiction remission. In which case, this opinion of the court should not preclude practitioners from prescribing buprenorphine or methadone to patients suffering from an addictive disorder.  1924  - Heroin Act Architectural technologist, importation and possession of heroin illegal - even for medicinal use.  1922 -- Narcotic  Drug Import and Export Act Enacted to assure proper control of importation, sale, possession, production and consumption of narcotics.  1927  -- Special Educational Needs Teacher of [employer] of Prohibition was responsible for tracking bootleggers and organized conservation officer, historic buildings. They focused primarily on interstate and international cases and those cases where local law enforcement official would not or could not act.  1932 -- Uniform State Narcotic Act Encouraged states to pass uniform state laws matching the federal Narcotic Drug Import and Export Act. Suggested prohibiting cannabis use at the state level.  42 -- Food, Drug, and Cosmetic Act The new law brought cosmetics and medical devices under control, and it required that drugs be labeled with adequate directions for safe use. Moreover, it mandated pre-market approval of all new drugs, such that a manufacturer would have to prove to FDA that a drug were safe before it could be sold  1951 -- Boggs Act Imposed maximum criminal penalties for violations of the import/export and internal revenue laws related to drugs and also established mandatory minimum prison sentences.  1956 -- Narcotics Control Act Increased Boggs Act penalties and mandatory prison sentence minimums for violations of existing drug laws.  1965 -- Drug Abuse Control Amendment Enacted to deal with problems caused by abuse of depressants, stimulants and hallucinogens. Restricted research into psychoactive drugs such as LSD by requiring FDA approval.  1970 -- Controlled Substance Act  Controlled Substances Import and Export Act These laws are a consolidation of numerous laws regulating the manufacture and distribution of narcotics, stimulants, depressants, hallucinogens, anabolic steroids, and chemicals used in the illicit production of controlled substances. The CSA places all substances that are regulated under existing federal law into one of five schedules. This placement is based upon  the substance's medicinal value, harmfulness, and potential for abuse or addiction. Schedule I is reserved for the most dangerous drugs that have no recognized medical use, while Schedule V is the classification used for the least dangerous drugs. The act also provides a mechanism for substances to be controlled, added to a schedule, decontrolled, removed from control, rescheduled, or transferred from one schedule to another.  35 - Drug Enforcement Agency By Executive Order, the DEA was formed to take place of the [employer] of Narcotics and Dangerous Drugs.  63 -Narcotic Addict Treatment Act of  1974  - Public Law [phone] Amends the Controlled Substance Act of 1970 to provide for the registration of practitioners conducting narcotic treatment programs. [methadone clinics] It also provides legal definitions for the phrases maintenance treatment and detoxification treatment.  1986 -- Anti-Drug Abuse Act of 1986 Strengthened Federal efforts to encourage foreign cooperation in eradicating illicit drug crops and in halting international drug traffic, to improve enforcement of Federal drug laws and enhance interdiction of illicit drug shipments, to provide strong Market researcher in establishing effective drug abuse prevention and education programs, to [employer] support for drug abuse treatment and rehabilitation efforts, and for other purposes. It also re-imposed mandatory sentencing minimums depending on which drug and how  much was involved.  1988 -- Anti-Drug Abuse Act of 1988 Established the Office of Materials Engineer (ONDCP) in the [employer] of the Economist; authorized funds for [employer], state and local drug enforcement activities, school-based drug prevention efforts, and drug abuse treatment with special emphasis on injecting drug abusers at high risk for AIDS.  2000 -- Federal - The Drug Addiction Treatment Act of 2000 (DATA 2000) It enables qualified physicians to  prescribe and/or dispense narcotics for the purpose of treating opioid dependency. For the first time, physicians are able to treat this disease from their private offices or other clinical settings. This presents a very desirable treatment option for those who are unwilling or unable to seek help in drug treatment clinics. Patients can now be treated in the privacy of their doctors office, as are other people being treated for any other type of medical condition. One medicine doctors may now prescribe is Buprenorphine. The major downfall of this Act is the limitation of 30 patients per practice - which means that large facilities, no matter how many physicians are there, can only treat 30 patients at a time.  2002-- DEA reschedules buprenorphine from a schedule V drug to a schedule III drug, on May 09, 2001 - the day before the FDA approval of Suboxone and Subutex despite overwhelming objection by the medical community.  2004: June 2004 THE CONFIDENTIALITY OF ALCOHOL AND DRUG ABUSE PATIENT RECORDS REGULATION AND THE HIPAA PRIVACY RULE:  Confidentiality of Alcohol and Drug Dependence Patient Records (summary) Code of Federal Regulations Title 42 Part 2 (42 CFR Part 2)  The confidentiality of alcohol and drug dependence patient records maintained by this practice/program is protected by federal law and regulations. Generally, the practice/program may not say to a person outside the practice/program that a patient attends the practice/program, or disclose any information identifying a patient as being alcohol or drug dependent unless:  The patient consents in writing; The disclosure is allowed by a court order, or The disclosure is made to medical personnel in a medical emergency or to qualified personnel for research,  audit, or practice/program evaluation. Violation of the federal law and regulations by a practice/program is a crime. Suspected violations may be reported to appropriate authorities  in accordance with federal regulations. Freight forwarder and regulations do not protect any information about a crime committed by a patient either at the practice/program or against any person who works for the practice/program or about any threat to commit such a crime. Federal laws and regulations do not protect any information about suspected child abuse or neglect from being reported under state law to appropriate state or local authorities.  sample consent form (MS-WORD)  2005: 03-04-2004 Public law [phone], Amends the Controlled Substances Act to eliminate the 30-patient limit for medical group practices allowed to dispense narcotic drugs in schedules III, IV, or V for maintenance or detoxification treatment (retains the 30-patient limit for an individual physician). This amendment removes the 30-patient limit on group medical practices that treat opioid dependence with buprenorphine. The restriction was part of the original Drug Addiction Treatment Act of 2000 (DATA) that allowed treatment of opioid dependence in a doctor's office. With this change, every certified doctor may now prescribe buprenorphine up to his or her individual physician limit of 30 patients.  2006: On 07/31/2005 President Levy signed Bill H.R.6344 into law. This allows physicians who have been certified to prescribe certain drugs for the treatment of opioid dependence under DATA2000 to treat up to  100 patients (up from 30) by submitting an intent notification to the Dept of Health and [employer]. This is a major step forward in both fighting the stigma and allowing access to treatment previously not available to some. For more details see 30/100-PATIENT LIMIT  2016: HHS augments regulations concerning the 30/100 patient limit by raising the limit to 275 for qualifying physicians. Link to summary of regulation  2016: Comprehensive Addiction and Recovery Act of 2016 (sec.303) amends the Controlled Substance Act - to allow Nurse  Practitioners and Physician Assistants to become eligible to prescribe buprenorphine for the treatment of opioid use disorder. See the entire law for more details.  The roots of the concurrent regulation of certain drugs under two statutory schemes go back to the beginning of this century. In 1906, Congress enacted the Pure Food and Drug Act, establishing one regime of regulation to assure (among other things) that drugs were not adulterated or misbranded. These regulations were amended several times, recodified in 1938, and expanded on again from the 1940s through the 1990s. Their implementation and enforcement is today assigned to the Food and Drug Administration (FDA) in the Department of Health and Human Services [facility]).  In 1914, Congress adopted the [place] Narcotic Act to stop abuse of addictive drugs. The [place] Narcotic Act was amended in 1937 to include marijuana. In 1965, amphetamines, barbiturates, and hallucinogens came under regulation, but under the [employer], Drug, and Cosmetic Act. In 1970, these various statutes were consolidated and recodified as the Controlled Substances Act (CSA), which has been amended several times since then. Its implementation and enforcement is today assigned to the Drug Enforcement Administration (DEA) in the Department of Justice.  The first clash occurred after World War I, when so-called morphine clinics existed and physicians prescribed or dispensed morphine to addicts. Some addicts were veterans of the American Civil War, the Spanish-American War, and WWI, who had become addicted during treatment for war wounds, but most of them came from the growing population of nonmedical addicts ([place], 8017). The Narcotics Division of the Prohibition Unit of the Department of the Treasury, which was then responsible for enforcing the [facility] Narcotic Act, concluded that this activity was not the legitimate practice of medicine but simple drug trafficking. The  Treasury Department swiftly closed the clinics and made it personally and professionally risky for physicians to maintain a narcotic addict for any reason. In did so, however, only after the American Medical Association had adopted a resolution, in 1920, opposing ambulatory clinics''.  In 1972, the public health establishment, including the Secretary of Health, Education, and Welfare, the Education Officer, Environmental, the [employer] of [employer], and the Chemical Engineer for Drug Abuse Prevention, was unprepared to allow [employer] of Narcotics and Dangerous Drugs, DEA's predecessor agency, to unilaterally define the parameters of medical practice for the use of methadone in the treatment of heroin addiction. As a consequence, a new set of rules--the third, on top of the FDA and DEA schemes--was added, one that inserted FDA deeply into the practice of medicine, notwithstanding its protestations to the contrary. Congress ratified this joint responsibility of law enforcement and public health officials for methadone through this third set of rules in 1974 with the passage of the Narcotic Addict Treatment Act (NATA). To examine in detail the evolution of this third set of rules--commonly referred to as the FDA or DHHS methadone regulations--we turn, first, to the period of the mid-1960s.  Increased use of heroin in the  post World War II period first became apparent in the early to mid 1950s. During the [employer], a minimum mandatory narcotics law was enacted in 1956, effective July 1957. 1962 [facility] conference on drug abuse, the [employer] on Narcotic and Drug Abuse (the [employer]) of 1963, the Drug Abuse Control Amendments of 1965, the President's Commission on [employer] and Administration of Justice (the [employer]) of [phone], and the Narcotic Addiction Rehabilitation Act of 1966.  The 1965 Drug Abuse Control Amendments  brought under strict federal control all nonnarcotic drugs capable of producing serious psychotoxic effects when abused. This act also created the [employer] of Drug Abuse Control within the Department of Health, Education, and Welfare (DHEW) and shifted the basis for aon corporation of illegal drugs from tax principles (administered by the Department of Treasury) to the regulation of commerce (administered by the [employer]).  The 1966 Narcotic Addiction Rehabilitation Act TOUR MANAGER) authorized the civil commitment of narcotic addicts, and federal assistance to state and local governments to develop a local system of drug treatment programs. With respect to the latter, the [employer] of Mental Health [facility]) initially proposed the gradual implementation of the state assistance effort, mainly through a common mental health mechanism--inpatient treatment programs. However, because of a perceived pressing need, the courts began to commit addicts to these programs even before they were officially opened or staffed. The NARA legislation imposed the following contract requirements on treatment centers: (1) thrice-a-week counseling sessions; (2) weekly urine tests; (3) restorative dental services; (4) psychological consultations and vocational training; and (5) the treatment modalities of drug-free outpatient, therapeutic community, and methadone maintenance. Reorganization Plan No. 1 of 1968 transferred the primary functions of the [employer] of Narcotics (FBN) from the [employer] to the Department of Justice; it also transferred the [employer] of Drug Abuse Control functions to the Department of Justice. Within the [employer], the [employer] of Narcotics and Dangerous Drugs (BNDD) was created, which became the Drug Enforcement Administration in 1973.   Under the first [place] administration [phone]), federal drug abuse policy developed in a significant way. These developments included a 1969 war  on drugs presidential message, resulting legislation in 1970, and a Special Action Office created by executive order in 1971 and authorized in statute in 1972. Brynn, in 1969, to send a message to Congress on drug abuse. Although this was the first time that a U.S. president invoked the war on drugs image, it was in retrospect the most balanced approach to the problem of drug abuse that had been advanced. The 1969 message resulted in the submission of legislation to the Congress and the passage, the following year, of the Comprehensive Drug Abuse Prevention and Control Act of 1970 [employer] [phone], May 29, 1969). The act dealt with research, treatment, and prevention of drug abuse and drug dependence, and with drug abuse charity fundraiser. One major purpose of the 1970 legislation was to reverse some of the strictures of the [employer] of 1914. The 1970 act sought to clarify for the medical profession . . . the extent to which they may safely go in treating narcotic addicts as patients. Title I, in Section IV, charged the Surveyor, Minerals, Education, and Welfare, to determine the appropriate methods of professional practice in the medical treatment of the narcotic addiction of various classes of narcotic addicts. This provision constitutes the initial statutory basis for treatment standards. The law enforcement sections consolidated all prior federal statutes into the  Controlled Substances Act and the Controlled Substances Export and Import Act (Titles II and III, respectively, of the Comprehensive Drug Abuse Prevention and Control Act of 1970). Under this legislation, substances were classified under five schedules according to their abuse potential, and psychological and physical effects. Methadone was placed in Schedule II, along with such opiate drugs as morphine, codeine , and hydrocodone .  One of the most important steps taken by President Brynn was to establish in June 1971 the  Special Action Office for Drug Abuse Prevention (SAODAP) in the [employer] of the President ([place] [phone], January 17, 1970). In mid-1971, [facility] appointed Dr. Maple Dunnings as SAODAP director. Within a year, the Drug Abuse Prevention Office and Treatment Act of 1972 [employer] [phone], October 22, 1970) gave statutory authority to [facility], but limiting setting, on January 30, 1974, as the limit on its existence.  The purpose of the 1972 act was to bring the resources of the federal government to bear on drug abuse with the immediate objective of significantly reducing its incidence and developing a comprehensive, coordinated long-term federal strategy to combat drug abuse.  Narcotic Addict Treatment Act [employer]) of 1974 [employer] [phone]), which amended the Controlled Substances Act. This legislation was driven by concern for the diversion of methadone to illicit channels that was occurring in 1972 and 1973, as reflected in the title of the Senate bill adopted on January 09, 1972, the Methadone Diversion Control Act of 1973. (U.S. Senate, 1970a, 8029a).  The 1980 final rule (45 FR 37305, April 22, 1979) reduced the minimum standard for admission from two years of addiction to one year coupled with a clinical determination that the individual was currently physiologically.  The regulations were next revised in 1989, following two proposals to modify them, one in 1983 and one in 1987.  Under President Tanda Corrente, a government-wide effort was made to review all federal government regulations and to eliminate or reduce the burden of these regulations on the private sector, state and nash-finch company, and wps resources.   The 1983 recommendations, though not adopted, did initiate another revision of the methadone regulations, which first found expression in a 1987 proposed rule (52 FR 37047, May 04, 1986) and culminated in a final rule (54 FR 8954, October 03, 1987) at the end of the  decade. In the 1987 proposed rule, the FDA and NIDA, in an effort to put the best face on the unenthusiastic 1983 response by the provider community to converting the regulations to guidelines, indicated that they had retained the current requirements necessary to achieve the goals of the 1974 NATA, but were proposing to streamline the regulation and to promote more efficient operation of methadone programs. The 1987 proposed rule, issued by the FDA and NIDA, advanced the following changes in the methadone regulation: that detoxification treatment be divided into short-term (<21 days) and long-term (>21 and <180 days) treatment; that the minimum staffing ratio of one counselor to 50 patients be eliminated; that blood tests be allowed as ways to conduct initial drug screening or to meet the monthly testing requirements for six-day take-home patients; that the 72-hour notification of FDA and the pertinent state authority for methadone doses greater than 100 mg be eliminated; that special adverse reaction reporting requirements for methadone be eliminated and reliance placed upon general FDA reporting requirements; that a supervising counselor be allowed to conduct the annual review of the patient's treatment plan for certain qualified patients who had been in treatment for 3 years  or longer; and that the requirement of an annual report of methadone treatment programs to the FDA be dropped. The FDA and NIDA issued a final rule on October 03, 1987, based on comments on the 1987 proposed (54 FR 8954). Concurrently, FDA and NIDA issued a six-page guidance document, which noted that the regulations, over time, had recommended certain practices that were not actually required. Public Health Service, in Congress, and elsewhere, to reorganize the Alcohol, Drug Abuse, and Mental Health Administration (ADAMHA). These efforts culminated in the [employer] of 1992 [employer] [phone], February 10, 1991), the main  purpose of which was to transfer the research portions of the three ADAMHA institutes--NIDA, the [employer] Mills of Alcoholism and Alcohol Abuse, and the [employer] Mills of Mental Health--to the [employer] and to create the Substance Abuse and Museum/gallery Exhibitions Officer [facility]) as the home for the service functions of these entitles.  Guidelines for Opioid Treatment The Federal Guidelines for Opioid Treatment Programs - 2015 serve as a guide to accrediting organizations for developing accreditation standards. The guidelines also provide OTPs with information on how programs can achieve and maintain compliance with federal regulations. The 2015 guidelines are an update to the 2007 Guidelines for the Accreditation of Opioid Treatment Programs (PDF  547 KB). The new document reflects the obligation of OTPs to deliver care consistent with the patient-centered, integrated, and recovery-oriented standards of substance use treatment.  DPT oversees the certification of OTPs and provides guidance to nonprofit organizations and state governmental entities that want to become a SAMHSA-approved accrediting body. Learn more about the accreditation and certification of OTPs and [facility] oversight of OTP accreditation bodies.  Model Guidelines for [employer] With input from [facility], the Federation of [employer] in 2013 adopted a revised version of the federations office-based opioid treatment policies. The Model Policy on DATA 2000 and Treatment of Opioid Addiction in the Medical Office - 2013 (PDF  279 KB) provides model guidelines for use by state medical boards in regulating office-based opioid treatment.  Holiday Guidance for Opioid Treatment Programs (PDF  203 KB) In response to requests for the upcoming federal holidays and ensuing weekends (December 24th, 25th, and 26th and December 31st, Jan 1st, and Jan 2nd), this letter is to provide guidance  regarding requests for unsupervised doses of medication for patients for these dates. View a sample [id number] (PDF  194 KB).  Federal regulation of drugs emerged as early as 69, under a law that addressed only imported drugs. In 1905 the [employer] launched a private, voluntary means of controlling a substantial part of the drug marketplace, a system that remained in place for over a half-century. Drug regulation in FDA has evolved considerably since President Ricardo Para signed the 1906 Pure Food and Drugs Act.  1820 Eleven doctors set up the U.S. Pharmacopeia and record the first list of standard drugs. 1848 Drug Importation Act passed by Congress requires U.S. Customs Service inspection to stop entry of tainted, low quality drugs from overseas. 8116 Dr. Mitchell MICAEL Burrs becomes the chief chemist at the [facility] of [employer] adulteration studies.  1905 The American Medical Association [facility]) begins a voluntary program of drug approval that would last until 1955. In order to advertise in the [facility] and related journals, drug companies must show proof that the drug will treat what they claim. 1906 The original Food and Drug Act is passed by Congress on June 30 and signed by [employer].  The Act outlaws states from buying and selling food, drinks, and drugs that have been mislabeled and tainted. 1911 In U.S. v. Vicci, the [employer] that the [employer] and Drugs Act does not outlaw false medical claims but only false and misleading statements about the ingredients or identity of a drug. 1912 Congress passes the Ada Amendment to overcome the ruling in U.S. v. Vicci. The Act outlaws labeling medicines with fake medical claims that is meant to trick the buyer. 1930 The name of the Food, Drug, and Insecticide Administration is shortened to Food and Drug Administration (FDA) under an therapist, music. 1933 FDA  recommends a total rewrite of the out-of-date 1906 Food and Drugs Act.   1937 Elixir Sulfanilamide, contain the poisonous liquid, diethylene glycol, kills 107 persons, many of whom are children, dramatizing the need to establish drug safety before marketing and to pass the pending food and drug law. 1938 Congress passes [employer], Drug, and Cosmetic (FDC) Act of 1938, which requires that new drugs show safety before selling. This starts a new system of drug regulation. The Act also requires that safe limits be set for unavoidable poisonous matter and allows for factory inspections. The Directv is given power to oversee advertising for all FDAregulated products except prescription drugs. FDA states that sulfanilamide and other dangerous drugs must be given under the direction of a medical expert. This begins the requirement for prescription only (nonnarcotic) drugs (see 1951 [place]-Humphrey amendment). 1941 Nearly 300 deaths and injuries result from the use of sulfathiazole tablets, an antibiotic, tainted with the sedative, phenobarbital. In response, FDA drastically changes manufacturing and quality controls. These changes lead to the development of good manufacturing practices (GMPs). 1948 The [employer] in U.S. v. Floretta that FDA jurisdiction extends to retail stores, thereby allowing FDA to stop illegal sales of drugs by pharmacies including barbiturates and amphetamines. 1950 In Walgreen. v. U.S., a U.S. Court of Appeals rules that the directions for use on a drug label must include the drugs purpose. 1951 Congress passes the Mer[place]old-Humphrey Amendment, which defines the kinds of drugs that cannot be used safely without medical supervision. The amendment limits sale of these drugs to prescription only by a medical professional. All other drugs are to be available without a prescription. 1952 A nationwide investigation by FDA  reveals that chloramphenicol, an antibiotic, caused nearly 180 cases of often deadly blood diseases. Two years later FDA engages the [employer] of Hospital Pharmacists, the American Association of Medical Record Librarians, and later the American Medical Association in a voluntary program of drug reaction reporting. 1953 The [employer] Amendment clarifies previous law and requires FDA to give manufacturers written reports of conditions seen during inspections and results of factory samples. 1962 Thalidomide, a new sleeping pill, causes severe birth defects of the arms and legs in thousands of babies born in [place]. The U.S. media reports on how Dr. Cathlean Mort, a FDA medical officer, helped prevent approval and marketing of Thalidomide in the [place] . These reports stirred up public support for stronger drug laws. 3 Congress passes the [employer]. For the first time, these laws require drug makers to prove their drug works before FDA can approve them for sale. The Advisory Committee on Investigational Drugs meets for the first time. This was the first meeting of a committee to advise FDA on product approval and policy on an ongoing basis. 1966 FDA contracts with the [facility] [facility]  of [employer] to measure the effectiveness of 4,000 marketed drugs approved on the basis of safety alone between [id number] and 07-22-61. The Fair Packaging and Labeling Act requires all consumer products, in interstate commerce, to be honestly and informatively labeled. 07/23/67 FDA forms the Drug Efficacy Study Implementation (DESI) to carry out recommendations of the [employer] of the effectiveness of drugs first sold between [place] and December 20, 196220-Dec-1970 FDA requires the first patient package insert, medicines must come with information for the patient about risks and benefits. 1972 Over-the-Counter Drug Review begins  to enhance the safety, effectiveness and appropriate labeling of drugs sold without prescription. 1973 The U.S. Supreme Court upholds the [place] drug effectiveness law and approves FDAs action to control entire classes of products. 1982 FDA issues Tamper-resistant Packaging Regulations to prevent poisonings such as deaths from cyanide placed in Tylenol  capsules. Congress passes the [employer] in 07/22/1982, making it a crime to tamper with packaged consumer products. 1983-07-23 Drug Price Advertising Account Planner Act (Hatch-Waxman Act) increases the availability of less costly generic drugs by allowing FDA to approve applications for generic versions of brand-name drugs without repeating the research that proved the safety and effectiveness of the brand-name drugs. The Act also allowed brand-name companies to apply for up to five years additional patent protection for the new medicines they developed to make up for time lost while their products were going through FDA's approval process. 1989 The FDA issued guidelines asking drug makers to decide if a drug is likely to have usefulness in elderly people and to include elderly people in studies when applicable. 1991 In 07-22-80, the FDA and the Department of Health and Human Services published a policy on protecting people in research. In 07/22/1990, this policy is adopted by more than a dozen federal agencies involved in human subject research and becomes known as the Common Rule. 4 1993 FDA launches MedWatch, a system designed to collect reports from health professionals on problems with drugs and other medical products. FDA issues guidelines for measuring gender differences in responses to medication. Drug companies are encouraged to include patients of both sexes in their research of drugs and to study any gender-specific effects. 1995 FDA declares cigarettes to be drug delivery devices. Limits are issued on marketing and  sales to reduce smoking by young people. 1998 FDA introduces the Adverse Event Reporting System (AERS), a computerized database designed to store and study safety reports on already marketed drugs.  The Demographic Rule requires that a marketing application review data on safety and effectiveness by age, gender, and race. The Pediatric Rule requires drug makers of selected new and existing drugs to conduct studies on drug safety and effectiveness in children. 1999 Creation of the Drug Facts Label for OTC drug products. The law requires all overthe-counter drug labels to have information in a standard format. These drug facts labels are designed to give the user easy-to-find information. 2000 The U. S. [employer], upholds an earlier decision from [employer] and Drug Administration v. [employer]. et al. and rules 5-4 that FDA does not have authority to regulate tobacco as a drug. 2001/07/22 The Best Pharmaceuticals for Children Act, in exchange for studying the drug in children, the drug maker gets six months of selling their product without competition. 2002/07/22 The Pediatric Research Equity Act gives FDA the right to ask drug companies to study the effectiveness of new drugs in children. July 23, 2003 FDA advises medical professionals to  limit the use of a pain reliever called Cox-2, a nonsteroidal anti-inflammatory drug (NSAIDs). Studies had shown that long-term use raised chances of heart attacks and strokes. The warning is also added to the over-thecounter NSAIDs Drug Facts label. Medicines used in hospitals must have a bar code to prevent patients from receiving the wrong medicine. 5 2005 The Drug Safety Board is formed, consisting of FDA staff and representatives from the [employer] of [address] and the [employer]. The Board advises the Director, Center for Drug Evaluation and Research, FDA, on drug safety issues and works with the agency in sharing safety  information to health professionals and patients.  The [place]  Food and Drug Administration (FDA) was first created to enforce the Pure Food and Drug Act of 1906. In this capacity, the FDA is charged with protecting the health of the [place]  public, to ensure the quality of its food, medicine, and cosmetics. Before this time, the [place]  government had no formal oversight of these products and left issues of quality and purity to the individual manufactures, or at times, individual states.    Review: [place] Stop ACT. (The Strengthen Opioid Misuse Prevention (STOP) Act of 2017). GENERAL ASSEMBLY OF [place]  SESSION 2017 SESSION LAW 2017-74 HOUSE BILL 243  PMP mandatory The dispenser shall report: (1) The dispenser's DEA number. (2) The name of the patient for whom the controlled substance is being dispensed, and the patient's: a. Full address, including city, state, and zip code, b. Telephone number, and c. Date of birth. (3) The date the prescription was written. (4) The date the prescription was filled. (5) The prescription number. (6) Whether the prescription is new or a refill. (7) Metric quantity of the dispensed drug. (8) Estimated days of supply of dispensed drug, if provided to the dispenser. (9) National Drug Code of dispensed drug. (10) Prescriber's DEA number. (11) Method of payment for the prescription.  No paper prescriptions  Duration of scripts Acute vs Chronic prescribing  2016 CDC Guidelines for prescribing Opioids for Chronic Pain. (Updated in 2022.) Medical Board  Laws:  Prescription Laws Drug laws, rules, and regulations are constantly changing. Any attempt to summarize them would quickly become outdated. Because of that, the Board encourages practitioners who seek guidance on prescribing procedures to refer to the sources listed below in addition to the Boards position statements, rules and Medical Practice Act.  [place]  Board of Pharmacy  (NCBOP) (which offers the states pharmacy laws and rules, and links to the Code of Federal Regulations) [employer] Site: www.ncbop.org  [place]  General Statutes General Web Site: politicalpool.cz See: [place]  Food, Drug, and Cosmetic Act: T7356139 & 106-134 See: [place]  Pharmacy Practice Act, Article 4A: [phone] See:   Controlled Substances Act, Article 5: 90-86 & 90-113.8 See: Use of controlled substances to render one mentally incapacitated or physically helpless: [employer] [facility]. Code, Title 21, Food & Drugs www.deadiversion.usdoj.gov Controlled Substances Schedules www.deadiversion.usdoj.gov Drug Warehouse Manager - www.deadiversion.usdoj.gov 42 CFR  8.12 - Federal opioid treatment standards.   Effective March 29, 2016, prior approval will be required for opioid analgesic doses for [facility]. Medicaid and [place] [facility]) beneficiaries which:  Exceed 120 mg of morphine equivalents (MME) per day  Are greater than a 14-day supply of any opioid, or,  Are non-preferred opioid products on the [place] Medicaid Preferred Drug List (PDL)  FEDERAL 42 CFR  8.12 - Federal opioid treatment standards. Title II of the Comprehensive Drug Abuse  Prevention and Control Act of 1970, commonly known as the Controlled Substance Act (CSA) Title 21 [place]  Code ([place]) Controlled Substances Act.   Reference:   ______________________________________________________________________       ______________________________________________________________________    Medication Rules  Purpose: To inform patients, and their family members, of our medication rules and regulations.  Applies to: All patients receiving prescriptions from our practice (written or electronic).  Pharmacy of record: This is the pharmacy where your electronic prescriptions will be sent. Make sure we have the correct one.  Electronic  prescriptions: In compliance with the   Strengthen Opioid Misuse Prevention (STOP) Act of 2017 (Session Law 2017-74/H243), effective August 03, 2018, all controlled substances must be electronically prescribed. Written prescriptions, faxing, or calling prescriptions to a pharmacy will no longer be done.  Prescription refills: These will be provided only during in-person appointments. No medications will be renewed without a face-to-face evaluation with your provider. Applies to all prescriptions.  NOTE: The following applies primarily to controlled substances (Opioid* Pain Medications).   Type of encounter (visit): For patients receiving controlled substances, face-to-face visits are required. (Not an option and not up to the patient.)  Patient's Responsibilities: Pain Pills: Bring all pain pills to every appointment (except for procedure appointments). Pill counts are required.  Pill Bottles: Bring pills in original pharmacy bottle. Bring bottle, even if empty. Always bring the bottle of the most recent fill.  Medication refills: You are responsible for knowing and keeping track of what medications you are taking and when is it that you will need a refill. The day before your appointment: write a list of all prescriptions that need to be refilled. The day of the appointment: give the list to the admitting nurse. Prescriptions will be written only during appointments. No prescriptions will be written on procedure days. If you forget a medication: it will not be Called in, Faxed, or electronically sent. You will need to get another appointment to get these prescribed. No early refills. Do not call asking to have your prescription filled early. Partial  or short prescriptions: Occasionally your pharmacy may not have enough pills to fill your prescription.  NEVER ACCEPT a partial fill or a prescription that is short of the total amount of pills that you were prescribed.  With  controlled substances the law allows 72 hours for the pharmacy to complete the prescription.  If the prescription is not completed within 72 hours, the pharmacist will require a new prescription to be written. This means that you will be short on your medicine and we WILL NOT send another prescription to complete your original prescription.  Instead, request the pharmacy to send a carrier to a nearby branch to get enough medication to provide you with your full prescription. Prescription Accuracy: You are responsible for carefully inspecting your prescriptions before leaving our office. Have the discharge nurse carefully go over each prescription with you, before taking them home. Make sure that your name is accurately spelled, that your address is correct. Check the name and dose of your medication to make sure it is accurate. Check the number of pills, and the written instructions to make sure they are clear and accurate. Make sure that you are given enough medication to last until your next medication refill appointment. Taking Medication: Take medication as prescribed. When it comes to controlled substances, taking less pills or less frequently than prescribed is permitted and encouraged. Never take more pills than instructed. Never take the medication more frequently than  prescribed.  Inform other Doctors: Always inform, all of your healthcare providers, of all the medications you take. Pain Medication from other Providers: You are not allowed to accept any additional pain medication from any other Doctor or Healthcare provider. There are two exceptions to this rule. (see below) In the event that you require additional pain medication, you are responsible for notifying us , as stated below. Cough Medicine: Often these contain an opioid, such as codeine  or hydrocodone . Never accept or take cough medicine containing these opioids if you are already taking an opioid* medication. The combination may cause  respiratory failure and death. Medication Agreement: You are responsible for carefully reading and following our Medication Agreement. This must be signed before receiving any prescriptions from our practice. Safely store a copy of your signed Agreement. Violations to the Agreement will result in no further prescriptions. (Additional copies of our Medication Agreement are available upon request.) Laws, Rules, & Regulations: All patients are expected to follow all [address] and [employer], Rules, [employer] [employer]. Ignorance of the Laws does not constitute a valid excuse.  Illegal drugs and Controlled Substances: The use of illegal substances (including, but not limited to marijuana and its derivatives) and/or the illegal use of any controlled substances is strictly prohibited. Violation of this rule may result in the immediate and permanent discontinuation of any and all prescriptions being written by our practice. The use of any illegal substances is prohibited. Adopted CDC guidelines & recommendations: Target dosing levels will be at or below 60 MME/day. Use of benzodiazepines** is not recommended. Urine Drug testing: Patients taking controlled substances will be required to provide a urine sample upon request. Do not void before coming to your medication management appointments. Hold emptying your bladder until you are admitted. The admitting nurse will inform you if a sample is required. Our practice reserves the right to call you at any time to provide a sample. Once receiving the call, you have 24 hours to comply with request. Not providing a sample upon request may result in termination of medication therapy.  Exceptions: There are only two exceptions to the rule of not receiving pain medications from other Healthcare Providers. Exception #1 (Emergencies): In the event of an emergency (i.e.: accident requiring emergency care), you are allowed to receive additional pain medication. However, you are  responsible for: As soon as you are able, call our office [phone], at any time of the day or night, and leave a message stating your name, the date and nature of the emergency, and the name and dose of the medication prescribed. In the event that your call is answered by a member of our staff, make sure to document and save the date, time, and the name of the person that took your information.  Exception #2 (Planned Surgery): In the event that you are scheduled by another doctor or dentist to have any type of surgery or procedure, you are allowed (for a period no longer than 30 days), to receive additional pain medication, for the acute post-op pain. However, in this case, you are responsible for picking up a copy of our Post-op Pain Management for Surgeons handout, and giving it to your surgeon or dentist. This document is available at our office, and does not require an appointment to obtain it. Simply go to our office during business hours (Monday-Thursday from 8:00 AM to 4:00 PM) (Friday 8:00 AM to 12:00 Noon) or if you have a scheduled appointment with us , prior to your surgery,  and ask for it by name. In addition, you are responsible for: calling our office [phone], at any time of the day or night, and leaving a message stating your name, name of your surgeon, type of surgery, and date of procedure or surgery. Failure to comply with your responsibilities may result in termination of therapy involving the controlled substances.  Consequences:  Non-compliance with the above rules may result in permanent discontinuation of medication prescription therapy. All patients receiving any type of controlled substance is expected to comply with the above patient responsibilities. Not doing so may result in permanent discontinuation of medication prescription therapy. Medication Agreement Violation. Following the above rules, including your responsibilities will help you in avoiding a Medication  Agreement Violation (Breaking your Pain Medication Contract).  *Opioid medications include: morphine, codeine , oxycodone , oxymorphone, hydrocodone , hydromorphone, meperidine, tramadol, tapentadol, buprenorphine, fentanyl , methadone. **Benzodiazepine medications include: diazepam (Valium), alprazolam (Xanax), clonazepam (Klonopine), lorazepam  (Ativan ), clorazepate (Tranxene), chlordiazepoxide (Librium), estazolam (Prosom), oxazepam (Serax), temazepam (Restoril), triazolam (Halcion) (Last updated: 05/26/2023) ______________________________________________________________________     ______________________________________________________________________    Medication Recommendations and Reminders  Applies to: All patients receiving prescriptions (written and/or electronic).  Medication Rules & Regulations: You are responsible for reading, knowing, and following our Medication Rules document. These exist for your safety and that of others. They are not flexible and neither are we. Dismissing or ignoring them is an act of non-compliance that may result in complete and irreversible termination of such medication therapy. For safety reasons, non-compliance will not be tolerated. As with the U[place] fundamental legal principle of ignorance of the law is no defense, we will accept no excuses for not having read and knowing the content of documents provided to you by our practice.  Pharmacy of record:  Definition: This is the pharmacy where your electronic prescriptions will be sent.  We do not endorse any particular pharmacy. It is up to you and your insurance to decide what pharmacy to use.  We do not restrict you in your choice of pharmacy. However, once we write for your prescriptions, we will NOT be re-sending more prescriptions to fix restricted supply problems created by your pharmacy, or your insurance.  The pharmacy listed in the electronic medical record should be the one where you want  electronic prescriptions to be sent. If you choose to change pharmacy, simply notify our nursing staff. Changes will be made only during your regular appointments and not over the phone.  Recommendations: Keep all of your pain medications in a safe place, under lock and key, even if you live alone. We will NOT replace lost, stolen, or damaged medication. We do not accept Police Reports as proof of medications having been stolen. After you fill your prescription, take 1 week's worth of pills and put them away in a safe place. You should keep a separate, properly labeled bottle for this purpose. The remainder should be kept in the original bottle. Use this as your primary supply, until it runs out. Once it's gone, then you know that you have 1 week's worth of medicine, and it is time to come in for a prescription refill. If you do this correctly, it is unlikely that you will ever run out of medicine. To make sure that the above recommendation works, it is very important that you make sure your medication refill appointments are scheduled at least 1 week before you run out of medicine. To do this in an effective manner, make sure that you do not leave the office  without scheduling your next medication management appointment. Always ask the nursing staff to show you in your prescription , when your medication will be running out. Then arrange for the receptionist to get you a return appointment, at least 7 days before you run out of medicine. Do not wait until you have 1 or 2 pills left, to come in. This is very poor planning and does not take into consideration that we may need to cancel appointments due to bad weather, sickness, or emergencies affecting our staff. DO NOT ACCEPT A Partial Fill: If for any reason your pharmacy does not have enough pills/tablets to completely fill or refill your prescription, do not allow for a partial fill. The law allows the pharmacy to complete that prescription within 72  hours, without requiring a new prescription. If they do not fill the rest of your prescription within those 72 hours, you will need a separate prescription to fill the remaining amount, which we will NOT provide. If the reason for the partial fill is your insurance, you will need to talk to the pharmacist about payment alternatives for the remaining tablets, but again, DO NOT ACCEPT A PARTIAL FILL, unless you can trust your pharmacist to obtain the remainder of the pills within 72 hours.  Prescription refills and/or changes in medication(s):  Prescription refills, and/or changes in dose or medication, will be conducted only during scheduled medication management appointments. (Applies to both, written and electronic prescriptions.) No refills on procedure days. No medication will be changed or started on procedure days. No changes, adjustments, and/or refills will be conducted on a procedure day. Doing so will interfere with the diagnostic portion of the procedure. No phone refills. No medications will be called into the pharmacy. No Fax refills. No weekend refills. No Holliday refills. No after hours refills.  Remember:  Business hours are:  Monday to Thursday 8:00 AM to 4:00 PM Provider's Schedule: Eric Como, MD - Appointments are:  Medication management: Monday and Wednesday 8:00 AM to 4:00 PM Procedure day: Tuesday and Thursday 7:30 AM to 4:00 PM Wallie Sherry, MD - Appointments are:  Medication management: Tuesday and Thursday 8:00 AM to 4:00 PM Procedure day: Monday and Wednesday 7:30 AM to 4:00 PM (Last update: 05/26/2022) ______________________________________________________________________     ______________________________________________________________________    National Pain Medication Shortage  The [place] is experiencing worsening drug shortages. These have had a negative widespread effect on patient care and treatment. Not expected to improve any time soon.  Predicted to last past 2029.   Drug shortage list (generic names) Oxycodone  IR Oxycodone /APAP Oxymorphone IR Hydromorphone Hydrocodone /APAP Morphine  Where is the problem?  Manufacturing and supply level.  Will this shortage affect you?  Only if you take any of the above pain medications.  How? You may be unable to fill your prescription.  Your pharmacist may offer a partial fill of your prescription. (Warning: Do not accept partial fills.) Prescriptions partially filled cannot be transferred to another pharmacy. Read our Medication Rules and Regulation. Depending on how much medicine you are dependent on, you may experience withdrawals when unable to get the medication.  Recommendations: Consider ending your dependence on opioid pain medications. Ask your pain specialist to assist you with the process. Consider switching to a medication currently not in shortage, such as Buprenorphine. Talk to your pain specialist about this option. Consider decreasing your pain medication requirements by managing tolerance thru Drug Holidays. This may help minimize withdrawals, should you run out of medicine. Control your pain  thru the use of non-pharmacological interventional therapies.   Your prescriber: Prescribers cannot be blamed for shortages. Medication manufacturing and supply issues cannot be fixed by the prescriber.   NOTE: The prescriber is not responsible for supplying the medication, or solving supply issues. Work with your pharmacist to solve it. The patient is responsible for the decision to take or continue taking the medication and for identifying and securing a legal supply source. By law, supplying the medication is the job and responsibility of the pharmacy. The prescriber is responsible for the evaluation, monitoring, and prescribing of these medications.   Prescribers will NOT: Re-issue prescriptions that have been partially filled. Re-issue prescriptions already sent to  a pharmacy.  Re-send prescriptions to a different pharmacy because yours did not have your medication. Ask pharmacist to order more medicine or transfer the prescription to another pharmacy. (Read below.)  New 2023 regulation: April 03, 2022 Revised Regulation Allows DEA-Registered Pharmacies to Transfer Electronic Prescriptions at a Patients Request DEA Headquarters Division - Public Information Office Patients now have the ability to request their electronic prescription be transferred to another pharmacy without having to go back to their practitioner to initiate the request. This revised regulation went into effect on Monday, March 30, 2022.     At a patients request, a DEA-registered retail pharmacy can now transfer an electronic prescription for a controlled substance (schedules II-V) to another DEA-registered retail pharmacy. Prior to this change, patients would have to go through their practitioner to cancel their prescription and have it re-issued to a different pharmacy. The process was taxing and time consuming for both patients and practitioners.    The Drug Enforcement Administration [facility]) published its intent to revise the process for transferring electronic prescriptions on June 21, 2020.  The final rule was published in the federal register on February 26, 2022 and went into effect 30 days later.  Under the final rule, a prescription can only be transferred once between pharmacies, and only if allowed under existing state or other applicable law. The prescription must remain in its electronic form; may not be altered in any way; and the transfer must be communicated directly between two licensed pharmacists. Its important to note, any authorized refills transfer with the original prescription, which means the entire prescription will be filled at the same pharmacy.   Reference: hugehand.is [facility] website announcement)  [email or web address].pdf [employer])   [employer] / Vol. 88, No. 143 / Thursday, February 26, 2022 / Rules and Regulations DEPARTMENT OF JUSTICE  Drug Enforcement Administration  21 CFR Part 1306  [Docket No. [id number]]  RIN [id number] Transfer of Electronic Prescriptions for Schedules II-V Controlled Substances Between Pharmacies for Initial Filling  ______________________________________________________________________       ______________________________________________________________________    Transfer of Pain Medication between Pharmacies  Re: 2023 DEA Clarification on existing regulation  Published on DEA Website: April 03, 2022  Title: Revised Regulation Allows DEA-Registered Pharmacies to Electrical Engineer Prescriptions at a Patients Request DEA Headquarters Division - [employer]  Patients now have the ability to request their electronic prescription be transferred to another pharmacy without having to go back to their practitioner to initiate the request. This revised regulation went into effect on Monday, March 30, 2022.     At a patients request, a DEA-registered retail pharmacy can now transfer an electronic prescription for a controlled substance (schedules II-V) to another DEA-registered retail pharmacy. Prior to this change, patients would have to go through their practitioner  to cancel their prescription and have it re-issued to a different pharmacy. The process was taxing and time consuming for both patients and practitioners.    The Drug Enforcement Administration [facility]) published its intent to revise the process for transferring electronic prescriptions on June 21, 2020.  The final rule was published in the  federal register on February 26, 2022 and went into effect 30 days later.  Under the final rule, a prescription can only be transferred once between pharmacies, and only if allowed under existing state or other applicable law. The prescription must remain in its electronic form; may not be altered in any way; and the transfer must be communicated directly between two licensed pharmacists. Its important to note, any authorized refills transfer with the original prescription, which means the entire prescription will be filled at the same pharmacy.    REFERENCES: 1. DEA website announcement hugehand.is  2. Department of Justice website  Cheapwipes.at.pdf  3. DEPARTMENT OF JUSTICE Drug Enforcement Administration 21 CFR Part 1306 [Docket No. [id number]] RIN [id number]-AB64 Transfer of Electronic Prescriptions for Schedules II-V Controlled Substances Between Pharmacies for Initial Filling  ______________________________________________________________________       ______________________________________________________________________    Medication Transfer   Notification You are currently compliant and stable on your pain medication regimen. This regimen will be transferred today to your Primary Care Provider (PCP). You will be provided with enough prescriptions to last for 90 days. After that, your prescriptions will need to be taken over by your PCP.  Recommendation Immediately contact your primary care provider to secure an appointment for evaluation before this period is over. Do not wait until the last month to contact them.   Clarification The transfer of your medication regimen does not mean that you are being discharged from our clinic. We will remain available to you for any consultation or interventional therapies you may need.    Alternative Should you decide not to continue taking these medication and would like assistance in permanently stopping them, please let us  know so that we can design a slow tapering down of your regimen.  Reason Our primary responsibility to provide specialized interventional pain management therapies otherwise not available to the community. We have in the past assisted primary care providers with reviewing and adjusting pain medication management therapies, however, we have been transparent to all patients and referring providers that it is not our intention to permanently take over this type of therapy. Transfer of this portion of your care will assist us  in freeing time to assist others in need of our specialty services.   ______________________________________________________________________      ______________________________________________________________________    Appointment Information  It is our goal and responsibility to provide the medical community with assistance in the evaluation and management of patients with chronic pain. Unfortunately our resources are limited. Because we do not have an unlimited amount of time, or available appointments, we are required to closely monitor for unkept or cancelled appointments.  Patient's responsibilities: 1. Punctuality: Patients are required to be physically present in our office at least 15 minutes before their scheduled appointment. 2. Tardiness: Patients not physically present in our office at their scheduled appointment time will be rescheduled. 3. Plan ahead: Assume that you will encounter traffic and plan to arrive 30 minutes before your appointment. 4. Other appointments and responsibilities: Do not schedule other appointments immediately before or after your scheduled appointment.  5. Be prepared: Make a list of everything that you need to discuss with your provider so  that you use your time efficiently. Once the provider  leaves your room, he/she will not return to your room to discuss anything that you neglected to bring up during your allowed time. 6. No children or pets: Do not bring children or pets to your appointment. 7. Cancelling or rescheduling your appointment: Advanced notification (more than 24 hours in advance) is required. 8. No Show: Not calling to cancel an appointment and simply not showing up is unacceptable. This leads to loss of appointments that could have been used by a patient in need. (See below)  Corrective process for repeat offenders:  No Shows: Three (3) No Shows within a 12 month period will result in an automatic discharge from our practice. Rescheduling or cancelling with more than 24 hours notice will not be penalized and will not count against you. Tardiness: If you have to be rescheduled three (3) times due to late arrivals, it will be counted as one (1) No Show. Cancellation or reschedule: Three (3) cancellations or rescheduling where notice was given with less than 24 hours in advance, will be recorded as one (1) No Show.  Types of appointments: New patient initial evaluation: These are evaluations only. Your initial patient questionnaire will be collected and entered into the system. A history of present illness will be taken. Prior lab work, imaging studies, and associated treatments will be reviewed. The provider may order appropriate diagnostic testing depending on their evaluation and review of available information. No treatments will be started on this visit. 2nd Follow-up visit: During this visit your provider will inform you of the results of the diagnostic tests ordered on the initial evaluation. Based on the providers assessment, treatment options will be offered, at which the patient will decide if he/she is interested in the alternatives. If interested, a treatment plan will be established and started. Procedure visits: Post-procedure evaluation  visits: Evaluation visits MM New problems Flare-up evaluations Follow-up after diagnostic testing ______________________________________________________________________

## 2024-10-09 ENCOUNTER — Encounter: Admitting: Nurse Practitioner
